# Patient Record
Sex: Male | Born: 1960 | Race: Black or African American | Hispanic: No | Marital: Single | Smoking: Current every day smoker
Health system: Southern US, Community
[De-identification: ages and names within clinical notes are randomized; demographics above are authoritative.]

## PROBLEM LIST (undated history)

## (undated) DIAGNOSIS — M1990 Unspecified osteoarthritis, unspecified site: Secondary | ICD-10-CM

## (undated) DIAGNOSIS — I69354 Hemiplegia and hemiparesis following cerebral infarction affecting left non-dominant side: Secondary | ICD-10-CM

## (undated) DIAGNOSIS — E119 Type 2 diabetes mellitus without complications: Secondary | ICD-10-CM

## (undated) DIAGNOSIS — Z8673 Personal history of transient ischemic attack (TIA), and cerebral infarction without residual deficits: Secondary | ICD-10-CM

## (undated) DIAGNOSIS — G40409 Other generalized epilepsy and epileptic syndromes, not intractable, without status epilepticus: Secondary | ICD-10-CM

## (undated) DIAGNOSIS — N179 Acute kidney failure, unspecified: Secondary | ICD-10-CM

## (undated) DIAGNOSIS — F1010 Alcohol abuse, uncomplicated: Secondary | ICD-10-CM

## (undated) DIAGNOSIS — I10 Essential (primary) hypertension: Secondary | ICD-10-CM

## (undated) DIAGNOSIS — E785 Hyperlipidemia, unspecified: Secondary | ICD-10-CM

## (undated) DIAGNOSIS — R600 Localized edema: Secondary | ICD-10-CM

## (undated) HISTORY — PX: OTHER SURGICAL HISTORY: SHX169

## (undated) HISTORY — DX: Hyperlipidemia, unspecified: E78.5

## (undated) HISTORY — DX: Unspecified osteoarthritis, unspecified site: M19.90

## (undated) HISTORY — DX: Essential (primary) hypertension: I10

## (undated) HISTORY — DX: Acute kidney failure, unspecified: N17.9

## (undated) HISTORY — DX: Localized edema: R60.0

---

## 2006-01-19 ENCOUNTER — Other Ambulatory Visit: Payer: Self-pay

## 2006-01-19 ENCOUNTER — Emergency Department: Payer: Self-pay | Admitting: Emergency Medicine

## 2006-05-13 ENCOUNTER — Emergency Department: Payer: Self-pay | Admitting: Emergency Medicine

## 2006-10-12 ENCOUNTER — Emergency Department: Payer: Self-pay | Admitting: Emergency Medicine

## 2010-01-06 ENCOUNTER — Emergency Department: Payer: Self-pay | Admitting: Emergency Medicine

## 2010-07-18 ENCOUNTER — Emergency Department: Payer: Self-pay | Admitting: Emergency Medicine

## 2014-05-02 ENCOUNTER — Observation Stay: Payer: Self-pay | Admitting: Internal Medicine

## 2014-05-02 DIAGNOSIS — I6789 Other cerebrovascular disease: Secondary | ICD-10-CM

## 2014-05-02 LAB — CBC
HCT: 34.9 % — ABNORMAL LOW (ref 40.0–52.0)
HGB: 11.2 g/dL — ABNORMAL LOW (ref 13.0–18.0)
MCH: 30.5 pg (ref 26.0–34.0)
MCHC: 32.3 g/dL (ref 32.0–36.0)
MCV: 94 fL (ref 80–100)
Platelet: 190 10*3/uL (ref 150–440)
RBC: 3.69 10*6/uL — ABNORMAL LOW (ref 4.40–5.90)
RDW: 13.8 % (ref 11.5–14.5)
WBC: 5.5 10*3/uL (ref 3.8–10.6)

## 2014-05-02 LAB — COMPREHENSIVE METABOLIC PANEL
Albumin: 3.8 g/dL (ref 3.4–5.0)
Alkaline Phosphatase: 102 U/L
Anion Gap: 15 (ref 7–16)
BUN: 22 mg/dL — ABNORMAL HIGH (ref 7–18)
Bilirubin,Total: 0.3 mg/dL (ref 0.2–1.0)
Calcium, Total: 8.3 mg/dL — ABNORMAL LOW (ref 8.5–10.1)
Chloride: 90 mmol/L — ABNORMAL LOW (ref 98–107)
Co2: 19 mmol/L — ABNORMAL LOW (ref 21–32)
Creatinine: 1.34 mg/dL — ABNORMAL HIGH (ref 0.60–1.30)
EGFR (African American): >60
EGFR (Non-African Amer.): 59 — ABNORMAL LOW
Glucose: 120 mg/dL — ABNORMAL HIGH (ref 65–99)
Osmolality: 254 (ref 275–301)
Potassium: 3.6 mmol/L (ref 3.5–5.1)
SGOT(AST): 30 U/L (ref 15–37)
SGPT (ALT): 26 U/L
Sodium: 124 mmol/L — ABNORMAL LOW (ref 136–145)
Total Protein: 8.3 g/dL — ABNORMAL HIGH (ref 6.4–8.2)

## 2014-05-02 LAB — DRUG SCREEN, URINE

## 2014-05-02 LAB — URINALYSIS, COMPLETE
Bilirubin,UR: NEGATIVE
Glucose,UR: NEGATIVE mg/dL (ref 0–75)
Ketone: NEGATIVE
Leukocyte Esterase: NEGATIVE
Nitrite: NEGATIVE
Ph: 5 (ref 4.5–8.0)
Protein: NEGATIVE
RBC,UR: NONE SEEN /HPF (ref 0–5)
Specific Gravity: 1.004 (ref 1.003–1.030)
Squamous Epithelial: <1
WBC UR: NONE SEEN /HPF (ref 0–5)

## 2014-05-02 LAB — BASIC METABOLIC PANEL
Anion Gap: 12 (ref 7–16)
BUN: 19 mg/dL — ABNORMAL HIGH (ref 7–18)
Calcium, Total: 8.5 mg/dL (ref 8.5–10.1)
Chloride: 93 mmol/L — ABNORMAL LOW (ref 98–107)
Co2: 21 mmol/L (ref 21–32)
Creatinine: 1.22 mg/dL (ref 0.60–1.30)
EGFR (African American): >60
EGFR (Non-African Amer.): >60
Glucose: 106 mg/dL — ABNORMAL HIGH (ref 65–99)
Osmolality: 256 (ref 275–301)
Potassium: 3.6 mmol/L (ref 3.5–5.1)
Sodium: 126 mmol/L — ABNORMAL LOW (ref 136–145)

## 2014-05-02 LAB — SALICYLATE LEVEL: Salicylates, Serum: 2 mg/dL

## 2014-05-02 LAB — ETHANOL
Ethanol %: 0.139 % — ABNORMAL HIGH (ref 0.000–0.080)
Ethanol: 139 mg/dL

## 2014-05-02 LAB — ACETAMINOPHEN LEVEL: Acetaminophen: <2 ug/mL

## 2014-05-02 LAB — TROPONIN I: Troponin-I: <0.02 ng/mL

## 2015-01-15 NOTE — Consult Note (Signed)
Referring Physician:  Albertine Patricia   Primary Care Physician:  Albertine Patricia [place], [address] [place], [place] [address], [place] [phone]  Reason for Consult: Admit Date: 02-May-2014  Chief Complaint: stuttering  Reason for Consult: CVA   History of Present Illness: History of Present Illness:   54 yo RHD M presents to [facility] secondary to stuttering and having problems getting out his words.  Pt states that this came on all of a sudden when he awakened yesterday but he thought that it would go away.  Pt has never had anything like this before.  Pt denies any numbness, weakness, vision changes or headaches with this.  Pt admits to drinking but that this happened prior to him drinking yesterday.  ROS:  General denies complaints   HEENT no complaints   Lungs no complaints   Cardiac no complaints   GI no complaints   GU no complaints   Musculoskeletal no complaints   Extremities no complaints   Skin no complaints   Neuro no complaints   Endocrine no complaints   Psych no complaints   Past Medical/Surgical Hx:  Hypercholesterolemia:   Diabetes:   CVA/Stroke:   gout:   hemorroids:   back pain:   htn:   Arthritis:   Denies surgical history.:   Past Medical/ Surgical Hx:  Past Medical History reviewed by me as above   Past Surgical History reviewed by me as above   Home Medications: Medication Instructions Last Modified Date/Time  thiamine 100 mg oral tablet 1 tab(s) orally once a day 09-Aug-15 10:07  atorvastatin 20 mg oral tablet 1 tab(s) orally once a day 09-Aug-15 10:07  Aspir 81 81 mg oral delayed release tablet 1 tab(s) orally once a day 09-Aug-15 03:15   Allergies:  No Known Allergies:   Allergies:  Allergies NKDA   Social/Family History: Employment Status: unemployed  Lives With: alone  Living Arrangements: apartment  Social History: + EtOH abuse with over two pints per day, + tob, denies  illicits  Family History: no seizure, no stroke   Vital Signs: **Vital Signs.:   09-Aug-15 11:47  Vital Signs Type Routine  Temperature Temperature (F) 98.2  Celsius 36.7  Pulse Pulse 77  Respirations Respirations 18  Systolic BP Systolic BP 509  Diastolic BP (mmHg) Diastolic BP (mmHg) 76  Mean BP 96  Pulse Ox % Pulse Ox % 94  Pulse Ox Activity Level  At rest  Oxygen Delivery Room Air/ 21 %   Physical Exam: General: NAD, nl weight, poorly kept  HEENT: normocephalic, sclera nonicteric, oropharynx clear  Neck: supple, no JVD, no bruits  Chest: CTA B, no wheezing, good movement  Cardiac: RRR, no murmurs, no edema, 2+ pulses  Extremities: no C/C/E, FROM   Neurologic Exam: Mental Status: alert and oriented x 3, mild word finding difficulty with nl repetition, stuttering still present, follows all commands  Cranial Nerves: PERRLA, EOMI, nl VF, face symmetric, tongue midline, shoulder shrug equal  Motor Exam: 5/5 B normal, tone, no tremor  Deep Tendon Reflexes: 2+/4 B, plantars downgoing B, no Hoffman  Sensory Exam: pinprick, temperature, and vibration intact B  Coordination: FTN and HTS WNL, nl RAM, nl gait   Lab Results: LabObservation:  09-Aug-15 07:29   OBSERVATION Reason for Test  Hepatic:  09-Aug-15 01:34   Bilirubin, Total 0.3  Alkaline Phosphatase 102 (46-116 NOTE: New Reference Range 04/13/14)  SGPT (ALT) 26 (14-63 NOTE: New Reference Range 04/13/14)  SGOT (AST) 30  Total Protein, Serum  8.3  Albumin, Serum 3.8  General Ref:  09-Aug-15 01:34   Acetaminophen, Serum < 2.0 (10-30 POTENTIALLY TOXIC:  > 200 mcg/mL  > 50 mcg/mL at 12 hr after  ingestion  > 300 mcg/mL at 4 hr after  ingestion)  Salicylates, Serum 2.0 (0.0-2.8 Therapeutic 2.8-20.0 mg/dL Toxic >30.0 mg/dL)  Routine Chem:  09-Aug-15 01:34   Ethanol, S. 139  Ethanol % (comp)  0.139 (Result(s) reported on 02 May 2014 at 02:06AM.)    09:21   Glucose, Serum  106  BUN  19  Creatinine (comp)  1.22  Sodium, Serum  126  Potassium, Serum 3.6  Chloride, Serum  93  CO2, Serum 21  Calcium (Total), Serum 8.5  Anion Gap 12  Osmolality (calc) 256  eGFR (African American) >60  eGFR (Non-African American) >60 (eGFR values <36m/min/1.73 m2 may be an indication of chronic kidney disease (CKD). Calculated eGFR is useful in patients with stable renal function. The eGFR calculation will not be reliable in acutely ill patients when serum creatinine is changing rapidly. It is not useful in  patients on dialysis. The eGFR calculation may not be applicable to patients at the low and high extremes of body sizes, pregnant women, and vegetarians.)  Urine Drugs:  [id number]-[id number]  Tricyclic Antidepressant, Ur Qual (comp) NEGATIVE (Result(s) reported on 02 May 2014 at 03:25AM.)  Amphetamines, Urine Qual. NEGATIVE  MDMA, Urine Qual. NEGATIVE  Cocaine Metabolite, Urine Qual. NEGATIVE  Opiate, Urine qual NEGATIVE  Phencyclidine, Urine Qual. NEGATIVE  Cannabinoid, Urine Qual. NEGATIVE  Barbiturates, Urine Qual. NEGATIVE  Benzodiazepine, Urine Qual. NEGATIVE (----------------- The URINE DRUG SCREEN provides only a preliminary, unconfirmed analytical test result and should not be used for non-medical  purposes.  Clinical consideration and professional judgment should be  applied to any positive drug screen result due to possible interfering substances.  A more specific alternate chemical method must be used in order to obtain a confirmed analytical result.  Gas chromatography/mass spectrometry (GC/MS) is the preferred confirmatory method.)  Methadone, Urine Qual. NEGATIVE  Cardiac:  09-Aug-15 01:34   Troponin I < 0.02 (0.00-0.05 0.05 ng/mL or less: NEGATIVE  Repeat testing in 3-6 hrs  if clinically indicated. >0.05 ng/mL: POTENTIAL  MYOCARDIAL INJURY. Repeat  testing in 3-6 hrs if  clinically indicated. NOTE: An increase or decrease  of 30% or more on serial  testing suggests a   clinically important change)  Routine UA:  09-Aug-15 01:34   Color (UA) Colorless  Clarity (UA) Clear  Glucose (UA) Negative  Bilirubin (UA) Negative  Ketones (UA) Negative  Specific Gravity (UA) 1.004  Blood (UA) 1+  pH (UA) 5.0  Protein (UA) Negative  Nitrite (UA) Negative  Leukocyte Esterase (UA) Negative (Result(s) reported on 02 May 2014 at 03:19AM.)  RBC (UA) NONE SEEN  WBC (UA) NONE SEEN  Bacteria (UA) TRACE  Epithelial Cells (UA) <1 /HPF  Mucous (UA) PRESENT (Result(s) reported on 02 May 2014 at 03:19AM.)  Routine Hem:  09-Aug-15 01:34   WBC (CBC) 5.5  RBC (CBC)  3.69  Hemoglobin (CBC)  11.2  Hematocrit (CBC)  34.9  Platelet Count (CBC) 190 (Result(s) reported on 02 May 2014 at 01:47AM.)  MCV 94  MCH 30.5  MCHC 32.3  RDW 13.8   Radiology Results: MRI:    09-Aug-15 10:54, MRI Brain Without Contrast  MRI Brain Without Contrast   REASON FOR EXAM:    CVA  COMMENTS:  PROCEDURE: MR  - MR BRAIN WO CONTRAST  - May 02 2014 10:54AM     CLINICAL DATA:  54 year old male with abnormal speech. Intractable  hiccups. Initial encounter.    EXAM:  MRI HEAD WITHOUT CONTRAST    TECHNIQUE:  Multiplanar, multiecho pulse sequences of the brain and surrounding  structures were obtained without intravenous contrast.  COMPARISON:  Head CT without contrast 0155 hr the same day.    FINDINGS:  Study is intermittently degraded by motion artifact despite repeated  imaging attempts.    Large body habitus. Cerebral volume is within normal limits for age.  No restricted diffusion to suggest acute infarction. No midline  shift, mass effect, evidence of mass lesion, ventriculomegaly,  extra-axial collection or acute intracranial hemorrhage.  Cervicomedullary junction and pituitary are within normal limits.  Negative visualized cervical spine. Major intracranial vascular flow  voids are preserved.    Chronic right external capsule lacunar infarct with hemosiderin.  Chronic  small right basal ganglia perivascular spaces or lacunar  infarcts. Patchy bilateral cerebral white matter T2 and FLAIR  hyperintensity. Chronic micro hemorrhage in the left caudate  nucleus. No definite cortical encephalomalacia. Brainstem and  cerebellum within normal limits.    Visible internal auditory structures appear normal. Mastoids are  clear. Mild paranasal sinus mucosal thickening mostly the right  maxillary. Visualized orbit soft tissues are within normal limits.  Visualized scalp soft tissues are within normal limits. Normal bone  marrow signal.     IMPRESSION:  1.  No acute intracranial abnormality.  2. Moderate to severe for age chronic small vessel disease /  ischemia.      Electronically Signed    By: Lars Pinks M.D.    On: 05/02/2014 11:04         Verified By: Gwenyth Bender. HALL, M.D.,  CT:    09-Aug-15 01:59, CT Head Without Contrast  CT Head Without Contrast   REASON FOR EXAM:    speech difficulty and hiccups, eval for cva  COMMENTS:       PROCEDURE: CT  - CT HEAD WITHOUT CONTRAST  - May 02 2014  1:59AM     CLINICAL DATA:  Speech difficulty in the calves. Evaluate for  stroke.    EXAM:  CT HEAD WITHOUT CONTRAST    TECHNIQUE:  Contiguous axial images were obtained from the base of the skull  through the vertex without intravenous contrast.  COMPARISON:  Prior CT from 07/18/2010    FINDINGS:  Remote right basal ganglia infarct noted. Scattered and confluent  hypodensity within the periventricular and deep white matter both  cerebral hemispheres is most compatible with mild chronic  microvascular ischemic disease.    There is no acute intracranial hemorrhage or infarct. No mass lesion  or midline shift. Gray-white matter differentiation is well  maintained. Ventricles are normal in size without evidence of  hydrocephalus. CSF containing spaces are within normal limits. No  extra-axial fluid collection.    The calvarium is intact.  Orbital soft  tissues are within normal limits.    The paranasal sinuses and mastoid air cells are well pneumatized and  free of fluid.    Scalp soft tissues are unremarkable.     IMPRESSION:  1. No acute intracranial process.  2. Remote right basal ganglia infarct.      Electronically Signed    By: Jeannine Boga M.D.    On: 05/02/2014 02:23     Verified By: Neomia Glass, M.D.,  Radiology Impression: Radiology Impression: MRI of brain personally reviewed by me and shows old L frontal infarct but no acute infarct   Impression/Recommendations: Recommendations:   prior notes reviewed by me reviewed by me   Speech changes-  these are likely worsening of old L frontal stroke from non neurologic causes such as hyponatremia and EtOH abuse Hyponatremia-  mildly symptomatic continue ASA and statin correct hyponatremia, must keep Na > 130 pt needs to stop drinking will sign off, please call with questions  Electronic Signatures: Jamison Neighbor (MD)  (Signed 09-Aug-15 12:49)  Authored: REFERRING PHYSICIAN, Primary Care Physician, Consult, History of Present Illness, Review of Systems, PAST MEDICAL/SURGICAL HISTORY, HOME MEDICATIONS, ALLERGIES, Social/Family History, NURSING VITAL SIGNS, Physical Exam-, LAB RESULTS, RADIOLOGY RESULTS, Recommendations   Last Updated: 09-Aug-15 12:49 by Jamison Neighbor (MD)

## 2015-01-15 NOTE — Discharge Summary (Signed)
PATIENT NAME:  Brad DuttonBALDWIN, Cartez A MR#:  [id number] DATE OF BIRTH:  01-18-1961  DATE OF ADMISSION:  05/02/2014 DATE OF DISCHARGE:  05/02/2014  DISCHARGE DIAGNOSES: 1. Alcohol abuse.  2. History of cerebrovascular accident with chronic speech problems.  3. Hyperlipidemia.  4. Noncompliance.  5. Tobacco abuse.   CONSULTANTS: Dr. Katrinka BlazingSmith with neurology.   IMAGING STUDIES DONE: Include a CT scan of the head without contrast which showed old strokes in the right basal ganglia; nothing acute.   MRI of the brain without contrast showed moderate to severe age-related chronic  changes, prior strokes. Nothing acute found.   Carotid Doppler showed stenosis less than 50%.   Echocardiogram showed ejection fraction of 60% to 65%, normal RVSP. No source for TIA or CVA found.   ADMITTING HISTORY AND PHYSICAL: Please see detailed H and P dictated by Dr. Randol KernElgergawy .   In brief, a 54 year old male patient with known CVA and prior speech problem presented to the hospital with hiccups and stuttering speech which was worse. The patient was admitted after consulting with neurology for concern for a new stroke. The patient also had alcohol level of 0.13 in the blood.   HOSPITAL COURSE: The patient was seen by neurology. Symptoms were thought to be likely secondary to his alcohol intoxication. The patient's sodium was a little low, but this seemed to be asymptomatic, was improved. He has been counseled to quit alcohol. He was also counseled to quit smoking for greater than 3 minutes on the day of discharge. The patient's symptoms seem to be chronic and he is being discharged home in a stable condition without any other focal neurological deficit.   DISCHARGE MEDICATIONS: 1. Aspirin 81 mg daily.  2. Atorvastatin 20 mg daily.  DISCHARGE INSTRUCTIONS: Low-fat diet. Activity as tolerated. Follow up with primary care physician in a week. Quit smoking and alcohol.   ____________________________ Molinda BailiffSrikar R. Shayle Donahoo,  MD srs:NT D: 05/05/2014 15:17:07 ET T: 05/05/2014 17:23:03 ET JOB#: [id number]  cc: Wardell HeathSrikar R. Amiir Heckard, MD, <Dictator> Orie FishermanSRIKAR R Johnetta Sloniker MD ELECTRONICALLY SIGNED 05/10/2014 14:11

## 2015-01-15 NOTE — H&P (Signed)
Brad Graham NAME:  Brad Brad Graham, Brad Brad Graham MR#:  [id number] DATE OF BIRTH:  Mar 24, 1961  DATE OF ADMISSION:  05/02/2014  REFERRING PHYSICIAN: Penni BombardKevin Brad Graham  PRIMARY CARE PHYSICIAN: Brad Brad Graham.  His first name is Brad NajjarLarry.  He does not know Brad last name.    CHIEF COMPLAINT: Hiccups, and stuttering.   HISTORY OF PRESENT ILLNESS:  This is Brad Brad Graham with known history of CVA in Brad past with no residual deficits, hypertension, hyperlipidemia, diabetes mellitus, tobacco abuse, alcohol abuse, who presents with above-mentioned complaints. Brad Brad Graham reports he started to have sudden hiccups this afternoon. Lasted for Brad Graham couple hours and resolved, but then reports at night he started to have Brad Graham stuttering problem. Brad Brad Graham reports he had stuttering in Brad past intermittent, on and off, but reports it did not come back bad for Brad Graham few years, which prompted him to come to Brad ED.  In Brad ED, Brad Brad Graham noticed not to have any other focal deficits. He denies any tingling, any numbness, any arm or leg weakness. Besides Brad stuttering he had no other speech abnormalities. There is no aphagia. Brad Brad Graham's CT head did not show any acute findings. Only showed evidence of his old cerebrovascular accident. Blood work-up was significant for hyponatremia at 124. Drug of abuse was negative. Brad Brad Graham had no leukocytosis as well as afebrile, blood pressure was acceptable. EKG did not show any abnormalities was normal sinus rhythm without ST or T wave abnormalities. ED physician discussed with Dr. Katrinka BlazingSmith, neurological on call, who requested Brad Brad Graham to be admitted for further CVA evaluation as this presentation as well might be due to CVA. Brad Brad Graham was given 324 mg of aspirin in ED.    PAST MEDICAL HISTORY:  1. Diabetes mellitus.  2. Hypertension.  3. Hyperlipidemia.  4. History of CVA.  5. Gout.  6. Arthritis.   ALLERGIES: No known drug allergies.   HOME  MEDICATIONS: Brad Brad Graham cannot recall his medications, but he knows he takes 1 medicine for blood pressure, hydrochlorothiazide, does not recall Brad dose. He does take cholesterol medicine as well but does not remember Brad name or Brad dose and he is on diabetes medication as well, does not remember Brad dose or Brad name as well and he is on aspirin 81 mg daily.   FAMILY HISTORY: Denies any family history of coronary artery disease at young age.   SOCIAL HISTORY: Brad Brad Graham lives at home by himself, has Brad Graham history of heavy alcohol abuse. Uses 1 pint of hard liquor per day, smokes 1 pack per day. No illicit drug use. He is on SSI.   REVIEW OF SYSTEMS: CONSTITUTIONAL: Denies fever, chills, fatigue, weakness, weight gain, weight loss.  EYES: Denies blurred vision, double vision, inflammation, glaucoma.  EARS, NOSE AND THROAT: Denies tinnitus, ear pain, hearing loss, epistaxis or discharge.  RESPIRATORY: Denies cough, wheezing, hemoptysis, COPD.  CARDIOVASCULAR: Denies chest pain, edema, palpitations, syncope.  GASTROINTESTINAL: Denies nausea, vomiting, diarrhea, abdominal pain, hematemesis, melena.  GENITOURINARY: Denies dysuria, hematuria, renal colic.  ENDOCRINE: Denies polyuria, polydipsia, heat or cold intolerance.  HEMATOLOGY: Denies anemia, easy bruising, bleeding diatheses.  INTEGUMENT: Denies acne, rash, or skin lesion.  MUSCULOSKELETAL: Denies any swelling, cramps. Reports history of gout and arthritis.  NEUROLOGIC: Brad Brad Graham reports hiccups and stuttering problem, but denies any focal deficits. No tingling. No numbness. No altered mental status. Reports history of CVA in Brad past.  PSYCHIATRIC: Denies anxiety, insomnia, or depression admits to  heavy alcohol abuse.   PHYSICAL EXAMINATION:  VITAL SIGNS: Temperature 97.8, pulse 77, respiratory rate 20, blood pressure 133/87, saturating 99% on room air.  GENERAL: Well-built, well-nourished Graham who looks comfortable in bed, in no apparent  distress.  HEENT: Head atraumatic, normocephalic. Pupils equal, reactive to light. Pink conjunctivae. Anicteric sclerae. Moist oral mucosa. No nasal discharge or bleed.  NECK: Supple. No thyromegaly. No JVD. No carotid bruits. No lymphadenopathy.  CHEST: Good air entry bilaterally. No wheezing, rales or rhonchi. No use of accessory muscle.  CARDIOVASCULAR: S1, S2 heard. No rubs, murmurs, gallops. Regular rate and rhythm. PMI nondisplaced.  ABDOMEN: Soft, nontender, nondistended. Bowel sounds present. No rebound, no guarding.  EXTREMITIES: No edema. No clubbing. No cyanosis. Pedal pulses +2 bilaterally. Long toenails.  MUSCULOSKELETAL: No joint effusion or erythema. No back pain.  PSYCHIATRIC: Appropriate affect. Awake, alert x 3. Intact judgment and insight.  NEUROLOGIC: Cranial nerves grossly intact. Motor 5 out of 5.. No focal deficits, no dysmetria. Negative Romberg sign, normal finger-to-nose test. Sensation is symmetrical and intact bilaterally to light touch. Brad Brad Graham has apparent stuttering problem, but has not aphagia, no expressive aphasia.    PERTINENT LABORATORY DATA: Glucose 120, BUN 22, creatinine 1.34, sodium 124, potassium 3.6, chloride 90, CO2 19. ALT 26, AST 30, alkaline phosphatase 102. Troponin less than 0.02. Drugs of abuse negative for entire panel. White blood cell 5.5, hemoglobin 11.2, hematocrit 34.9, platelets 190,000. Salicylate 2, acetaminophen less than 2.    IMAGING STUDIES:  CT head with contrast showing no acute intracranial process and old right basal ganglion infarct.   ASSESSMENT AND PLAN:  1. Stuttering with hiccups, currently hiccups resolved, stuttering still persisted, ED discussed with Brad neurologist on-call, Dr. Katrinka Blazing, who requested Brad Brad Graham to be admitted for cerebrovascular accident evaluation, we will obtain MRI, carotid Dopplers and 2D echocardiogram. We will admit him to telemetry as well. We will consult neurology consult. We will give Brad Brad Graham  324 mg of aspirin. We will continue him on statin and aspirin at this point.  2. Alcohol abuse. Brad Brad Graham will be started on [facility] Withdrawal [facility] protocol.  3. Tobacco abuse: Brad Brad Graham will be started on nicotine patch, was counseled.  4. Hyponatremia. This is most likely related to his heavy alcohol consumption and hydrochlorothiazide. We will hold hydrochlorothiazide and will start him on intravenous normal saline. We will monitor closely.  5. Hypertension. Blood pressure is acceptable. Will resume him back on his home medications once Brad dose is known, but for as well for Brad time being, we will allow for permissive   hypertension, . diabetes mellitus. We will start on insulin sliding scale until his oral medications are known. 6. History of cerebrovascular accident. Continue with aspirin, statin.  7. Hyperlipidemia. Continue with statin.  8. Deep vein thrombosis prophylaxis. Subcutaneous heparin.   CODE STATUS: Full code.   TOTAL TIME SPENT ON ADMISSION AND Brad Graham CARE: 55 minutes.     ____________________________ Starleen Arms, MD dse:jh D: 05/02/2014 03:47:07 ET T: 05/02/2014 04:18:00 ET JOB#: [id number]  cc: Starleen Arms, MD, <Dictator> Antrice Pal Teena Irani MD ELECTRONICALLY SIGNED 05/03/2014 0:05

## 2015-05-10 ENCOUNTER — Encounter: Payer: Self-pay | Admitting: Unknown Physician Specialty

## 2015-05-10 ENCOUNTER — Ambulatory Visit (INDEPENDENT_AMBULATORY_CARE_PROVIDER_SITE_OTHER): Payer: Medicaid Other | Admitting: Unknown Physician Specialty

## 2015-05-10 VITALS — BP 116/78 | HR 80 | Temp 98.2°F | Ht 68.7 in | Wt 220.4 lb

## 2015-05-10 DIAGNOSIS — R739 Hyperglycemia, unspecified: Secondary | ICD-10-CM

## 2015-05-10 DIAGNOSIS — R809 Proteinuria, unspecified: Secondary | ICD-10-CM | POA: Insufficient documentation

## 2015-05-10 DIAGNOSIS — N189 Chronic kidney disease, unspecified: Secondary | ICD-10-CM

## 2015-05-10 DIAGNOSIS — E119 Type 2 diabetes mellitus without complications: Secondary | ICD-10-CM | POA: Insufficient documentation

## 2015-05-10 DIAGNOSIS — I10 Essential (primary) hypertension: Secondary | ICD-10-CM | POA: Insufficient documentation

## 2015-05-10 DIAGNOSIS — I129 Hypertensive chronic kidney disease with stage 1 through stage 4 chronic kidney disease, or unspecified chronic kidney disease: Secondary | ICD-10-CM | POA: Diagnosis not present

## 2015-05-10 DIAGNOSIS — E785 Hyperlipidemia, unspecified: Secondary | ICD-10-CM

## 2015-05-10 LAB — LIPID PANEL PICCOLO, WAIVED
Chol/HDL Ratio Piccolo,Waive: 2.4 mg/dL
Cholesterol Piccolo, Waived: 213 mg/dL — ABNORMAL HIGH (ref <200)
HDL Chol Piccolo, Waived: 90 mg/dL (ref >59)
LDL Chol Calc Piccolo Waived: 89 mg/dL (ref <100)
Triglycerides Piccolo,Waived: 169 mg/dL — ABNORMAL HIGH (ref <150)
VLDL Chol Calc Piccolo,Waive: 34 mg/dL — ABNORMAL HIGH (ref <30)

## 2015-05-10 LAB — MICROALBUMIN, URINE WAIVED
Creatinine, Urine Waived: 300 mg/dL (ref 10–300)
Microalb, Ur Waived: 150 mg/L — ABNORMAL HIGH (ref 0–19)

## 2015-05-10 LAB — BAYER DCA HB A1C WAIVED: HB A1C (BAYER DCA - WAIVED): 5.6 % (ref <7.0)

## 2015-05-10 MED ORDER — ATORVASTATIN CALCIUM 20 MG PO TABS: 20.0000 mg | ORAL_TABLET | Freq: Every day | ORAL | Status: DC

## 2015-05-10 NOTE — Assessment & Plan Note (Signed)
Elevated microalbumin

## 2015-05-10 NOTE — Assessment & Plan Note (Addendum)
S/P stroke and ASCVD calculator recommends statin therapy.  Started on atorvastatin  daily. Recheck in 3 months

## 2015-05-10 NOTE — Assessment & Plan Note (Signed)
A1C 5.6 on 05/10/15

## 2015-05-10 NOTE — Progress Notes (Signed)
BP 116/78 mmHg  Pulse 80  Temp(Src) 98.2 F (36.8 C)  Ht 5' 8.7" (1.745 m)  Wt 220 lb 6.4 oz (99.973 kg)  BMI 32.83 kg/m2  SpO2 99%   Subjective:    Patient ID: Brad Graham, male    DOB: 1961/02/10, 54 y.o.   MRN: [id number]  HPI: Brad Graham is a 54 y.o. male  Chief Complaint  Patient presents with  . Establish Care   Hypertension/Hyperlipidemia: Patient reports he is compliant with blood pressure medication but does not recall the names of meds.  Denies chest pain, dyspnea or headache. He does not monitor blood pressure at home. He reports a history of stroke. He is a smoker and reports smoking 3 -4 cigarettes per day. He reports drinking 2-3 drinks per day a "few times a week". He admitted to drinking four of the last five days. He is on no cholesterol medications at this time.    Hyperglycemia: He reports taking a "sugar pill" each day, does not know name of pill or dose. Denies any change in sensation to extremities. Denies change in diet, appetite or weight. Does not monitor blood sugars at home.    Relevant past medical, surgical, family and social history reviewed and updated as indicated. Interim medical history since our last visit reviewed. Allergies and medications reviewed and updated.  Review of Systems  Constitutional: Negative.  Negative for fever, chills, activity change, appetite change, fatigue and unexpected weight change.  HENT: Negative.  Negative for congestion, sneezing, sore throat and trouble swallowing.   Eyes: Negative.   Respiratory: Negative.  Negative for cough, chest tightness, shortness of breath, wheezing and stridor.   Cardiovascular: Negative.  Negative for chest pain, palpitations and leg swelling.  Skin: Negative.  Negative for color change, pallor, rash and wound.  Psychiatric/Behavioral: Negative.  Negative for behavioral problems, confusion and agitation. The patient is not nervous/anxious.     Per HPI unless specifically  indicated above     Objective:    BP 116/78 mmHg  Pulse 80  Temp(Src) 98.2 F (36.8 C)  Ht 5' 8.7" (1.745 m)  Wt 220 lb 6.4 oz (99.973 kg)  BMI 32.83 kg/m2  SpO2 99%  Wt Readings from Last 3 Encounters:  05/10/15 220 lb 6.4 oz (99.973 kg)    Physical Exam  Constitutional: He is oriented to person, place, and time. He appears well-developed and well-nourished. No distress.  HENT:  Head: Normocephalic and atraumatic.  Eyes: Pupils are equal, round, and reactive to light.  Neck: Normal range of motion.  Cardiovascular: Normal rate, regular rhythm and normal heart sounds.  Exam reveals no gallop and no friction rub.   No murmur heard. Pulmonary/Chest: Effort normal and breath sounds normal. No respiratory distress. He has no wheezes. He has no rales. He exhibits no tenderness.  Neurological: He is alert and oriented to person, place, and time.  Skin: Skin is warm and dry. No rash noted. He is not diaphoretic. No erythema. No pallor.  Psychiatric: He has a normal mood and affect. His behavior is normal. Judgment and thought content normal.        Assessment & Plan:   Problem List Items Addressed This Visit      Unprioritized   Essential hypertension - Primary    Well controlled on Amlodipine , carvedilol  twice daily and lisinopril/hydrochlorothiazide 20-25 daily.      Relevant Medications   amLODipine (NORVASC) 5 MG tablet   lisinopril-hydrochlorothiazide (PRINZIDE,ZESTORETIC) 20-25 MG  per tablet   carvedilol (COREG) 25 MG tablet   atorvastatin (LIPITOR) 20 MG tablet   Hyperglycemia    A1C 5.6 on 05/10/15      Relevant Orders   Microalbumin, Urine Waived (Completed)   Uric acid   Bayer DCA Hb A1c Waived (Completed)   Hyperlipidemia    S/P stroke and ASCVD calculator recommends statin therapy.  Started on atorvastatin 20mg  daily. Recheck in 3 months      Relevant Medications   amLODipine (NORVASC) 5 MG tablet   lisinopril-hydrochlorothiazide  (PRINZIDE,ZESTORETIC) 20-25 MG per tablet   carvedilol (COREG) 25 MG tablet   atorvastatin (LIPITOR) 20 MG tablet   Other Relevant Orders   Lipid Panel Piccolo, Waived (Completed)   Comprehensive metabolic panel   Proteinuria    Elevated microalbumin         A1C is 5.6. Microalbumin is 150 today. Total cholesterol is 213 and triglycerides 169, remainder of labs still pending.   Follow up plan: Return in about 3 months (around 08/10/2015). Recheck lipid panel.

## 2015-05-10 NOTE — Assessment & Plan Note (Addendum)
Well controlled on Amlodipine , carvedilol  twice daily and lisinopril/hydrochlorothiazide 20-25 daily.

## 2015-05-11 LAB — COMPREHENSIVE METABOLIC PANEL
ALT: 11 IU/L (ref 0–44)
AST: 13 IU/L (ref 0–40)
Albumin/Globulin Ratio: 1.4 (ref 1.1–2.5)
Albumin: 4.3 g/dL (ref 3.5–5.5)
Alkaline Phosphatase: 79 IU/L (ref 39–117)
BUN/Creatinine Ratio: 21 — ABNORMAL HIGH (ref 9–20)
BUN: 49 mg/dL — ABNORMAL HIGH (ref 6–24)
Bilirubin Total: 0.4 mg/dL (ref 0.0–1.2)
CO2: 16 mmol/L — ABNORMAL LOW (ref 18–29)
Calcium: 9.2 mg/dL (ref 8.7–10.2)
Chloride: 104 mmol/L (ref 97–108)
Creatinine, Ser: 2.36 mg/dL — ABNORMAL HIGH (ref 0.76–1.27)
GFR calc Af Amer: 34 mL/min/{1.73_m2} — ABNORMAL LOW (ref >59)
GFR calc non Af Amer: 30 mL/min/{1.73_m2} — ABNORMAL LOW (ref >59)
Globulin, Total: 3.1 g/dL (ref 1.5–4.5)
Glucose: 91 mg/dL (ref 65–99)
Potassium: 4.9 mmol/L (ref 3.5–5.2)
Sodium: 140 mmol/L (ref 134–144)
Total Protein: 7.4 g/dL (ref 6.0–8.5)

## 2015-05-11 LAB — URIC ACID: Uric Acid: 10.4 mg/dL — ABNORMAL HIGH (ref 3.7–8.6)

## 2015-05-13 ENCOUNTER — Other Ambulatory Visit: Payer: Self-pay | Admitting: Unknown Physician Specialty

## 2015-05-13 DIAGNOSIS — N183 Chronic kidney disease, stage 3 unspecified: Secondary | ICD-10-CM

## 2015-05-13 NOTE — Progress Notes (Signed)
Phone call with patient to discuss GFR of 30.  Refer to [facility].  Asked to monitor BP and avoid NSAIDS

## 2015-05-26 ENCOUNTER — Other Ambulatory Visit: Payer: Self-pay

## 2015-05-26 MED ORDER — LISINOPRIL-HYDROCHLOROTHIAZIDE 20-25 MG PO TABS: 1.0000 | ORAL_TABLET | Freq: Every day | ORAL | Status: DC

## 2015-05-26 MED ORDER — AMLODIPINE BESYLATE 5 MG PO TABS: 5.0000 mg | ORAL_TABLET | Freq: Every day | ORAL | Status: DC

## 2015-05-26 NOTE — Telephone Encounter (Signed)
Patient was last seen 05/10/15 and pharmacy is [facility].

## 2015-05-31 ENCOUNTER — Other Ambulatory Visit: Payer: Self-pay | Admitting: Nephrology

## 2015-05-31 DIAGNOSIS — N183 Chronic kidney disease, stage 3 (moderate): Secondary | ICD-10-CM

## 2015-06-13 ENCOUNTER — Ambulatory Visit
Admission: RE | Admit: 2015-06-13 | Discharge: 2015-06-13 | Disposition: A | Discharge status: Home / Self Care | Payer: Medicaid Other | Source: Ambulatory Visit | Attending: Nephrology | Admitting: Nephrology

## 2015-08-10 ENCOUNTER — Ambulatory Visit: Payer: Medicaid Other | Admitting: Unknown Physician Specialty

## 2015-08-15 ENCOUNTER — Encounter: Payer: Self-pay | Admitting: Unknown Physician Specialty

## 2015-08-15 ENCOUNTER — Other Ambulatory Visit: Payer: Self-pay | Admitting: Unknown Physician Specialty

## 2015-08-15 ENCOUNTER — Ambulatory Visit (INDEPENDENT_AMBULATORY_CARE_PROVIDER_SITE_OTHER): Payer: Medicaid Other | Admitting: Unknown Physician Specialty

## 2015-08-15 VITALS — BP 125/84 | HR 82 | Temp 98.3°F | Ht 68.0 in | Wt 218.2 lb

## 2015-08-15 DIAGNOSIS — E785 Hyperlipidemia, unspecified: Secondary | ICD-10-CM | POA: Diagnosis not present

## 2015-08-15 NOTE — Progress Notes (Signed)
BP 125/84 mmHg  Pulse 82  Temp(Src) 98.3 F (36.8 C)  Ht  (1.727 m)  Wt 218 lb 3.2 oz (98.975 kg)  BMI 33.18 kg/m2  SpO2 98%   Subjective:    Patient ID: Brad Graham, male    DOB: Jun 09, 1961, 54 y.o.   MRN: [id number]  HPI: Brad Graham is a 54 y.o. male  Chief Complaint  Patient presents with  . Hyperlipidemia  . Hypertension   Hypertension Seeing nephrology for CKD notes last visit Using medications without difficulty Average home BPs not checking   No problems or lightheadedness No chest pain with exertion or shortness of breath No Edema   Hyperlipidemia Using medications without problems: No Muscle aches  Diet compliance watches what he eats Exercise: not exercising    Relevant past medical, surgical, family and social history reviewed and updated as indicated. Interim medical history since our last visit reviewed. Allergies and medications reviewed and updated.  Review of Systems  Per HPI unless specifically indicated above     Objective:    BP 125/84 mmHg  Pulse 82  Temp(Src) 98.3 F (36.8 C)  Ht  (1.727 m)  Wt 218 lb 3.2 oz (98.975 kg)  BMI 33.18 kg/m2  SpO2 98%  Wt Readings from Last 3 Encounters:  08/15/15 218 lb 3.2 oz (98.975 kg)  05/10/15 220 lb 6.4 oz (99.973 kg)    Physical Exam  Constitutional: He is oriented to person, place, and time. He appears well-developed and well-nourished. No distress.  HENT:  Head: Normocephalic and atraumatic.  Eyes: Conjunctivae and lids are normal. Right eye exhibits no discharge. Left eye exhibits no discharge. No scleral icterus.  Cardiovascular: Normal rate, regular rhythm and normal heart sounds.   Pulmonary/Chest: Effort normal and breath sounds normal. No respiratory distress.  Abdominal: Normal appearance. There is no splenomegaly or hepatomegaly.  Musculoskeletal: Normal range of motion.  Neurological: He is alert and oriented to person, place, and time.  Skin: Skin is  intact. No rash noted. No pallor.  Psychiatric: He has a normal mood and affect. His behavior is normal. Judgment and thought content normal.    Results for orders placed or performed in visit on 05/10/15  Microalbumin, Urine Waived  Result Value Ref Range   Microalb, Ur Waived 150 (H) 0 - 19 mg/L   Creatinine, Urine Waived 300 10 - 300 mg/dL   Microalb/Creat Ratio 30-300 (H) <30 mg/g  Uric acid  Result Value Ref Range   Uric Acid 10.4 (H) 3.7 - 8.6 mg/dL  Bayer DCA Hb L2G Waived  Result Value Ref Range   Bayer DCA Hb A1c Waived 5.6 <7.0 %  Lipid Panel Piccolo, Waived  Result Value Ref Range   Cholesterol Piccolo, Waived 213 (H) <200 mg/dL   HDL Chol Piccolo, Waived 90 >59 mg/dL   Triglycerides Piccolo,Waived 169 (H) <150 mg/dL   Chol/HDL Ratio Piccolo,Waive 2.4 mg/dL   LDL Chol Calc Piccolo Waived 89 <100 mg/dL   VLDL Chol Calc Piccolo,Waive 34 (H) <30 mg/dL  Comprehensive metabolic panel  Result Value Ref Range   Glucose 91 65 - 99 mg/dL   BUN 49 (H) 6 - 24 mg/dL   Creatinine, Ser 4.01 (H) 0.76 - 1.27 mg/dL   GFR calc non Af Amer 30 (L) >59 mL/min/1.73   GFR calc Af Amer 34 (L) >59 mL/min/1.73   BUN/Creatinine Ratio 21 (H) 9 - 20   Sodium 140 134 - 144 mmol/L   Potassium 4.9  3.5 - 5.2 mmol/L   Chloride 104 97 - 108 mmol/L   CO2 16 (L) 18 - 29 mmol/L   Calcium 9.2 8.7 - 10.2 mg/dL   Total Protein 7.4 6.0 - 8.5 g/dL   Albumin 4.3 3.5 - 5.5 g/dL   Globulin, Total 3.1 1.5 - 4.5 g/dL   Albumin/Globulin Ratio 1.4 1.1 - 2.5   Bilirubin Total 0.4 0.0 - 1.2 mg/dL   Alkaline Phosphatase 79 39 - 117 IU/L   AST 13 0 - 40 IU/L   ALT 11 0 - 44 IU/L      Assessment & Plan:   Problem List Items Addressed This Visit      Unprioritized   Hyperlipidemia - Primary   Relevant Orders   LP+ALT+AST Piccolo, Waived       Follow up plan: Return in about 6 months (around 02/12/2016).

## 2015-08-16 LAB — LP+ALT+AST PICCOLO, WAIVED
ALT (SGPT) Piccolo, Waived: 20 U/L (ref 10–47)
AST (SGOT) Piccolo, Waived: 39 U/L — ABNORMAL HIGH (ref 11–38)

## 2015-08-20 LAB — LIPID PANEL W/O CHOL/HDL RATIO
Cholesterol, Total: 176 mg/dL
HDL: 131 mg/dL (ref >39)
LDL Calculated: 35 mg/dL
Triglycerides: 50 mg/dL
VLDL Cholesterol Cal: 10 mg/dL (ref 5–40)

## 2015-08-20 LAB — LIPID PANEL PICCOLO, WAIVED

## 2015-08-26 ENCOUNTER — Other Ambulatory Visit: Payer: Self-pay

## 2015-08-26 MED ORDER — TAMSULOSIN HCL 0.4 MG PO CAPS: 0.4000 mg | ORAL_CAPSULE | Freq: Every day | ORAL | Status: DC

## 2015-08-26 MED ORDER — CARVEDILOL 25 MG PO TABS: 25.0000 mg | ORAL_TABLET | Freq: Two times a day (BID) | ORAL | Status: DC

## 2015-08-26 NOTE — Telephone Encounter (Signed)
Patient was last seen 08/15/15 [facility]

## 2015-09-02 ENCOUNTER — Other Ambulatory Visit: Payer: Self-pay | Admitting: Unknown Physician Specialty

## 2015-10-24 ENCOUNTER — Other Ambulatory Visit: Payer: Self-pay | Admitting: Unknown Physician Specialty

## 2015-11-08 ENCOUNTER — Other Ambulatory Visit: Payer: Self-pay | Admitting: Unknown Physician Specialty

## 2015-11-17 ENCOUNTER — Other Ambulatory Visit: Payer: Self-pay | Admitting: Unknown Physician Specialty

## 2016-01-23 ENCOUNTER — Other Ambulatory Visit: Payer: Self-pay | Admitting: Unknown Physician Specialty

## 2016-01-25 ENCOUNTER — Other Ambulatory Visit: Payer: Self-pay | Admitting: Unknown Physician Specialty

## 2016-02-07 ENCOUNTER — Other Ambulatory Visit: Payer: Self-pay | Admitting: Unknown Physician Specialty

## 2016-02-13 ENCOUNTER — Ambulatory Visit: Payer: Medicaid Other | Admitting: Unknown Physician Specialty

## 2016-02-17 ENCOUNTER — Other Ambulatory Visit: Payer: Self-pay | Admitting: Unknown Physician Specialty

## 2016-02-22 ENCOUNTER — Other Ambulatory Visit: Payer: Self-pay | Admitting: Unknown Physician Specialty

## 2016-02-22 ENCOUNTER — Ambulatory Visit: Payer: Medicaid Other | Admitting: Unknown Physician Specialty

## 2016-02-22 NOTE — Telephone Encounter (Signed)
Needs seen further refills 

## 2016-03-05 NOTE — Telephone Encounter (Signed)
Pt scheduled for a follow up appt 03/13/16. Thanks.

## 2016-03-07 ENCOUNTER — Ambulatory Visit: Payer: Medicaid Other | Admitting: Unknown Physician Specialty

## 2016-03-13 ENCOUNTER — Ambulatory Visit: Payer: Medicaid Other | Admitting: Unknown Physician Specialty

## 2016-03-20 ENCOUNTER — Other Ambulatory Visit: Payer: Self-pay | Admitting: Unknown Physician Specialty

## 2016-03-26 ENCOUNTER — Ambulatory Visit: Payer: Medicaid Other | Admitting: Unknown Physician Specialty

## 2016-04-02 ENCOUNTER — Ambulatory Visit: Payer: Medicaid Other | Admitting: Unknown Physician Specialty

## 2016-04-09 ENCOUNTER — Ambulatory Visit: Payer: Medicaid Other | Admitting: Unknown Physician Specialty

## 2016-04-13 ENCOUNTER — Ambulatory Visit: Payer: Medicaid Other | Admitting: Unknown Physician Specialty

## 2016-04-19 ENCOUNTER — Other Ambulatory Visit: Payer: Self-pay | Admitting: Unknown Physician Specialty

## 2016-04-19 NOTE — Telephone Encounter (Signed)
Your patient 

## 2016-04-20 ENCOUNTER — Ambulatory Visit: Payer: Medicaid Other | Admitting: Unknown Physician Specialty

## 2016-04-20 NOTE — Telephone Encounter (Signed)
Patient has appointment 04/23/16.

## 2016-04-23 ENCOUNTER — Ambulatory Visit: Payer: Medicaid Other | Admitting: Unknown Physician Specialty

## 2016-04-24 ENCOUNTER — Ambulatory Visit: Payer: Medicaid Other | Admitting: Unknown Physician Specialty

## 2016-05-11 ENCOUNTER — Ambulatory Visit: Payer: Medicaid Other | Admitting: Unknown Physician Specialty

## 2016-07-02 ENCOUNTER — Observation Stay
Admission: EM | Admit: 2016-07-02 | Discharge: 2016-07-03 | Disposition: A | Discharge status: Home / Self Care | Payer: Medicaid Other | Attending: Internal Medicine | Admitting: Internal Medicine

## 2016-07-02 ENCOUNTER — Emergency Department: Payer: Medicaid Other

## 2016-07-02 ENCOUNTER — Observation Stay: Payer: Medicaid Other

## 2016-07-02 DIAGNOSIS — N400 Enlarged prostate without lower urinary tract symptoms: Secondary | ICD-10-CM | POA: Insufficient documentation

## 2016-07-02 DIAGNOSIS — I69354 Hemiplegia and hemiparesis following cerebral infarction affecting left non-dominant side: Secondary | ICD-10-CM | POA: Diagnosis not present

## 2016-07-02 DIAGNOSIS — E871 Hypo-osmolality and hyponatremia: Secondary | ICD-10-CM | POA: Insufficient documentation

## 2016-07-02 DIAGNOSIS — R739 Hyperglycemia, unspecified: Secondary | ICD-10-CM | POA: Insufficient documentation

## 2016-07-02 DIAGNOSIS — R55 Syncope and collapse: Secondary | ICD-10-CM

## 2016-07-02 DIAGNOSIS — Y900 Blood alcohol level of less than 20 mg/100 ml: Secondary | ICD-10-CM | POA: Insufficient documentation

## 2016-07-02 DIAGNOSIS — I10 Essential (primary) hypertension: Secondary | ICD-10-CM | POA: Diagnosis not present

## 2016-07-02 DIAGNOSIS — Z79899 Other long term (current) drug therapy: Secondary | ICD-10-CM | POA: Insufficient documentation

## 2016-07-02 DIAGNOSIS — F1010 Alcohol abuse, uncomplicated: Secondary | ICD-10-CM | POA: Diagnosis not present

## 2016-07-02 DIAGNOSIS — E860 Dehydration: Secondary | ICD-10-CM | POA: Insufficient documentation

## 2016-07-02 DIAGNOSIS — E785 Hyperlipidemia, unspecified: Secondary | ICD-10-CM | POA: Insufficient documentation

## 2016-07-02 DIAGNOSIS — R569 Unspecified convulsions: Secondary | ICD-10-CM | POA: Diagnosis present

## 2016-07-02 DIAGNOSIS — F17200 Nicotine dependence, unspecified, uncomplicated: Secondary | ICD-10-CM | POA: Insufficient documentation

## 2016-07-02 HISTORY — DX: Personal history of transient ischemic attack (TIA), and cerebral infarction without residual deficits: Z86.73

## 2016-07-02 HISTORY — DX: Alcohol abuse, uncomplicated: F10.10

## 2016-07-02 HISTORY — DX: Syncope and collapse: R55

## 2016-07-02 LAB — COMPREHENSIVE METABOLIC PANEL
ALT: 16 U/L — ABNORMAL LOW (ref 17–63)
AST: 47 U/L — ABNORMAL HIGH (ref 15–41)
Albumin: 4.6 g/dL (ref 3.5–5.0)
Alkaline Phosphatase: 95 U/L (ref 38–126)
Anion gap: 19 — ABNORMAL HIGH (ref 5–15)
BUN: 17 mg/dL (ref 6–20)
CO2: 14 mmol/L — ABNORMAL LOW (ref 22–32)
Calcium: 9.5 mg/dL (ref 8.9–10.3)
Chloride: 93 mmol/L — ABNORMAL LOW (ref 101–111)
Creatinine, Ser: 2.1 mg/dL — ABNORMAL HIGH (ref 0.61–1.24)
GFR calc Af Amer: 39 mL/min — ABNORMAL LOW (ref >60)
GFR calc non Af Amer: 33 mL/min — ABNORMAL LOW (ref >60)
Glucose, Bld: 258 mg/dL — ABNORMAL HIGH (ref 65–99)
Potassium: 4 mmol/L (ref 3.5–5.1)
Sodium: 126 mmol/L — ABNORMAL LOW (ref 135–145)
Total Bilirubin: 1.2 mg/dL (ref 0.3–1.2)
Total Protein: 8.6 g/dL — ABNORMAL HIGH (ref 6.5–8.1)

## 2016-07-02 LAB — URINALYSIS COMPLETE WITH MICROSCOPIC (ARMC ONLY)
Bilirubin Urine: NEGATIVE
Glucose, UA: 50 mg/dL — AB
Ketones, ur: NEGATIVE mg/dL
Leukocytes, UA: NEGATIVE
Nitrite: NEGATIVE
Protein, ur: NEGATIVE mg/dL
Specific Gravity, Urine: 1.008 (ref 1.005–1.030)
Squamous Epithelial / LPF: NONE SEEN
pH: 5 (ref 5.0–8.0)

## 2016-07-02 LAB — URINE DRUG SCREEN, QUALITATIVE (ARMC ONLY)
Amphetamines, Ur Screen: NOT DETECTED
Barbiturates, Ur Screen: NOT DETECTED
Benzodiazepine, Ur Scrn: NOT DETECTED
Cannabinoid 50 Ng, Ur ~~LOC~~: NOT DETECTED
Cocaine Metabolite,Ur ~~LOC~~: NOT DETECTED
MDMA (Ecstasy)Ur Screen: NOT DETECTED
Methadone Scn, Ur: NOT DETECTED
Opiate, Ur Screen: NOT DETECTED
Phencyclidine (PCP) Ur S: NOT DETECTED
Tricyclic, Ur Screen: NOT DETECTED

## 2016-07-02 LAB — GLUCOSE, CAPILLARY
Glucose-Capillary: 274 mg/dL — ABNORMAL HIGH (ref 65–99)
Glucose-Capillary: 134 mg/dL — ABNORMAL HIGH (ref 65–99)
Glucose-Capillary: 124 mg/dL — ABNORMAL HIGH (ref 65–99)

## 2016-07-02 LAB — ETHANOL: Alcohol, Ethyl (B): <5 mg/dL (ref <5)

## 2016-07-02 LAB — LIPASE, BLOOD: Lipase: 33 U/L (ref 11–51)

## 2016-07-02 LAB — MAGNESIUM: Magnesium: 1.9 mg/dL (ref 1.7–2.4)

## 2016-07-02 LAB — TROPONIN I: Troponin I: <0.03 ng/mL (ref <0.03)

## 2016-07-02 MED ORDER — VITAMIN B-1 100 MG PO TABS
100.0000 mg | ORAL_TABLET | Freq: Every day | ORAL | Status: DC
  Administered 2016-07-02 – 2016-07-03 (×2): 100 mg via ORAL
  Filled 2016-07-02: qty 1

## 2016-07-02 MED ORDER — AMLODIPINE BESYLATE 5 MG PO TABS
5.0000 mg | ORAL_TABLET | Freq: Every day | ORAL | Status: DC
  Administered 2016-07-02 – 2016-07-03 (×2): 5 mg via ORAL
  Filled 2016-07-02: qty 1

## 2016-07-02 MED ORDER — THIAMINE HCL 100 MG/ML IJ SOLN: 100.0000 mg | Freq: Every day | INTRAMUSCULAR | Status: DC

## 2016-07-02 MED ORDER — AMLODIPINE BESYLATE 5 MG PO TABS
ORAL_TABLET | ORAL | Status: AC
  Administered 2016-07-02: 5 mg
  Filled 2016-07-02: qty 1

## 2016-07-02 MED ORDER — FOLIC ACID 1 MG PO TABS
1.0000 mg | ORAL_TABLET | Freq: Every day | ORAL | Status: DC
  Administered 2016-07-02 – 2016-07-03 (×2): 1 mg via ORAL
  Filled 2016-07-02: qty 1

## 2016-07-02 MED ORDER — ATORVASTATIN CALCIUM 20 MG PO TABS
20.0000 mg | ORAL_TABLET | Freq: Every day | ORAL | Status: DC
  Administered 2016-07-02: 17:00:00 20 mg via ORAL
  Filled 2016-07-02: qty 1

## 2016-07-02 MED ORDER — HEPARIN SODIUM (PORCINE) 5000 UNIT/ML IJ SOLN
5000.0000 [IU] | Freq: Three times a day (TID) | INTRAMUSCULAR | Status: DC
  Administered 2016-07-02 – 2016-07-03 (×3): 5000 [IU] via SUBCUTANEOUS
  Filled 2016-07-02 (×2): qty 1

## 2016-07-02 MED ORDER — ADULT MULTIVITAMIN W/MINERALS CH
ORAL_TABLET | ORAL | Status: AC
  Filled 2016-07-02: qty 1

## 2016-07-02 MED ORDER — CARVEDILOL 25 MG PO TABS
25.0000 mg | ORAL_TABLET | Freq: Two times a day (BID) | ORAL | Status: DC
  Administered 2016-07-02 – 2016-07-03 (×2): 25 mg via ORAL
  Filled 2016-07-02 (×2): qty 1

## 2016-07-02 MED ORDER — SODIUM CHLORIDE 0.9 % IV SOLN
INTRAVENOUS | Status: DC
  Administered 2016-07-02 – 2016-07-03 (×3): via INTRAVENOUS

## 2016-07-02 MED ORDER — HEPARIN SODIUM (PORCINE) 5000 UNIT/ML IJ SOLN
INTRAMUSCULAR | Status: AC
  Filled 2016-07-02: qty 1

## 2016-07-02 MED ORDER — SODIUM CHLORIDE 0.9% FLUSH
3.0000 mL | Freq: Two times a day (BID) | INTRAVENOUS | Status: DC
  Administered 2016-07-02 – 2016-07-03 (×3): 3 mL via INTRAVENOUS

## 2016-07-02 MED ORDER — ACETAMINOPHEN 650 MG RE SUPP: 650.0000 mg | Freq: Four times a day (QID) | RECTAL | Status: DC | PRN

## 2016-07-02 MED ORDER — ADULT MULTIVITAMIN W/MINERALS CH
1.0000 | ORAL_TABLET | Freq: Every day | ORAL | Status: DC
  Administered 2016-07-02 – 2016-07-03 (×2): 1 via ORAL
  Filled 2016-07-02: qty 1

## 2016-07-02 MED ORDER — TAMSULOSIN HCL 0.4 MG PO CAPS
0.4000 mg | ORAL_CAPSULE | Freq: Every day | ORAL | Status: DC
  Administered 2016-07-02 – 2016-07-03 (×2): 0.4 mg via ORAL
  Filled 2016-07-02: qty 1

## 2016-07-02 MED ORDER — ACETAMINOPHEN 325 MG PO TABS: 650.0000 mg | ORAL_TABLET | Freq: Four times a day (QID) | ORAL | Status: DC | PRN

## 2016-07-02 MED ORDER — VITAMIN B-1 100 MG PO TABS
ORAL_TABLET | ORAL | Status: AC
  Administered 2016-07-02: 100 mg via ORAL
  Filled 2016-07-02: qty 1

## 2016-07-02 MED ORDER — TAMSULOSIN HCL 0.4 MG PO CAPS
ORAL_CAPSULE | ORAL | Status: AC
  Filled 2016-07-02: qty 1

## 2016-07-02 MED ORDER — INSULIN ASPART 100 UNIT/ML ~~LOC~~ SOLN
0.0000 [IU] | Freq: Three times a day (TID) | SUBCUTANEOUS | Status: DC
  Administered 2016-07-02: 1 [IU] via SUBCUTANEOUS
  Filled 2016-07-02: qty 1

## 2016-07-02 MED ORDER — LORAZEPAM 2 MG/ML IJ SOLN
1.0000 mg | Freq: Once | INTRAMUSCULAR | Status: AC
Start: 2016-07-02 — End: 2016-07-02
  Administered 2016-07-02: 1 mg via INTRAVENOUS
  Filled 2016-07-02: qty 1

## 2016-07-02 MED ORDER — LORAZEPAM 1 MG PO TABS: 1.0000 mg | ORAL_TABLET | Freq: Four times a day (QID) | ORAL | Status: DC | PRN

## 2016-07-02 MED ORDER — SODIUM CHLORIDE 0.9 % IV BOLUS (SEPSIS)
500.0000 mL | Freq: Once | INTRAVENOUS | Status: AC
  Administered 2016-07-02: 500 mL via INTRAVENOUS

## 2016-07-02 MED ORDER — LORAZEPAM 2 MG/ML IJ SOLN: 1.0000 mg | Freq: Four times a day (QID) | INTRAMUSCULAR | Status: DC | PRN

## 2016-07-02 MED ORDER — FOLIC ACID 1 MG PO TABS
ORAL_TABLET | ORAL | Status: AC
  Filled 2016-07-02: qty 1

## 2016-07-02 NOTE — ED Triage Notes (Signed)
Pt BIB EMS, reports he had witnessed seizure while at store, per bystanders no pulse was detected and CPR was started approx 1 minute, pt post-ictal upon EMS arrival.

## 2016-07-02 NOTE — H&P (Signed)
[facility] [place] [facility]   PATIENT NAME: Jaishon Krisher    MR#:  [id number]  DATE OF BIRTH:  1960-11-24  DATE OF ADMISSION:  07/02/2016  PRIMARY CARE PHYSICIAN: Gabriel Cirri, NP   REQUESTING/REFERRING PHYSICIAN: Ileana Roup MD  CHIEF COMPLAINT:   Chief Complaint  Patient presents with  . Seizures    HISTORY OF PRESENT ILLNESS: Noris Kulinski  is a 55 y.o. male with a known history of Alcohol abuse, history of CVA, hyperlipidemia and hypertension who reports that he tries to drink when he can get the money. But he drinks most days. His last drink was on Saturday. He was working cleaning a window when apparently he passed out and started foaming at the mouth. A bystander who was a EMS person started CPR on him and called the ambulance. When they arrived he was noted to have a pulse. Patient reports that he is not sure what happened this has never happened before he denies feeling feeling sick recently. He denies any chest pain or shortness of breath or palpitations.        PAST MEDICAL HISTORY:   Past Medical History:  Diagnosis Date  . Alcohol abuse   . Arthritis   . H/O: CVA (cerebrovascular accident)   . Hyperlipidemia   . Hypertension     PAST SURGICAL HISTORY: Past Surgical History:  Procedure Laterality Date  . none      SOCIAL HISTORY:  Social History  Substance Use Topics  . Smoking status: Current Some Day Smoker  . Smokeless tobacco: Never Used  . Alcohol use 6.0 oz/week    10 Standard drinks or equivalent per week     Comment: dialy    FAMILY HISTORY:  Family History  Problem Relation Age of Onset  . Diabetes Mother   . Stroke Mother   . Heart disease Maternal Grandmother   . Heart disease Maternal Grandfather     DRUG ALLERGIES: No Known Allergies  REVIEW OF SYSTEMS:   CONSTITUTIONAL: No fever, fatigue or weakness.  EYES: No blurred or double vision.  EARS, NOSE, AND THROAT: No tinnitus or ear pain.   RESPIRATORY: No cough, shortness of breath, wheezing or hemoptysis.  CARDIOVASCULAR: No chest pain, orthopnea, edema. Positive passing out episode earlier today GASTROINTESTINAL: No nausea, vomiting, diarrhea or abdominal pain.  GENITOURINARY: No dysuria, hematuria.  ENDOCRINE: No polyuria, nocturia,  HEMATOLOGY: No anemia, easy bruising or bleeding SKIN: No rash or lesion. MUSCULOSKELETAL: No joint pain or arthritis.   NEUROLOGIC: No tingling, numbness, positive left sided weakness.  PSYCHIATRY: No anxiety or depression.   MEDICATIONS AT HOME:  Prior to Admission medications   Medication Sig Start Date End Date Taking? Authorizing Provider  amLODipine (NORVASC) 5 MG tablet Take 1 tablet (5 mg total) by mouth daily. 11/17/15  Yes Gabriel Cirri, NP  atorvastatin (LIPITOR) 20 MG tablet Take 1 tablet (20 mg total) by mouth daily at 6 PM. Patient taking differently: Take 20 mg by mouth at bedtime.  04/19/16  Yes Gabriel Cirri, NP  carvedilol (COREG) 25 MG tablet Take 1 tablet (25 mg total) by mouth 2 (two) times daily with a meal. 04/19/16  Yes Gabriel Cirri, NP  lisinopril-hydrochlorothiazide (PRINZIDE,ZESTORETIC) 20-25 MG per tablet Take 1 tablet by mouth daily. 05/26/15  Yes Gabriel Cirri, NP  tamsulosin (FLOMAX) 0.4 MG CAPS capsule Take 1 capsule (0.4 mg total) by mouth daily. 03/20/16  Yes Gabriel Cirri, NP      PHYSICAL EXAMINATION:  VITAL SIGNS: Blood pressure (!) 144/85, pulse (!) 108, temperature 98.1 F (36.7 C), temperature source Oral, resp. rate 19, height 5\' 7"  (1.702 m), weight 222 lb 10.6 oz (101 kg), SpO2 97 %.  GENERAL:  55 y.o.-year-old patient lying in the bed with no acute distress.  EYES: Pupils equal, round, reactive to light and accommodation. No scleral icterus. Extraocular muscles intact.  HEENT: Head atraumatic, normocephalic. Oropharynx and nasopharynx clear.  NECK:  Supple, no jugular venous distention. No thyroid enlargement, no tenderness.  LUNGS: Normal breath  sounds bilaterally, no wheezing, rales,rhonchi or crepitation. No use of accessory muscles of respiration.  CARDIOVASCULAR: S1, S2 normal. No murmurs, rubs, or gallops.  ABDOMEN: Soft, nontender, nondistended. Bowel sounds present. No organomegaly or mass.  EXTREMITIES: No pedal edema, cyanosis, or clubbing.  NEUROLOGIC: Cranial nerves II through XII are intact. Muscle strength 4 /5 in left lower extremity. Sensation intact. Gait not checked.  PSYCHIATRIC: The patient is alert and oriented x 3.  SKIN: No obvious rash, lesion, or ulcer.   LABORATORY PANEL:   CBC No results for input(s): WBC, HGB, HCT, PLT, MCV, MCH, MCHC, RDW, LYMPHSABS, MONOABS, EOSABS, BASOSABS, BANDABS in the last 168 hours.  Invalid input(s): NEUTRABS, BANDSABD ------------------------------------------------------------------------------------------------------------------  Chemistries   Recent Labs Lab 07/02/16 1227  NA 126*  K 4.0  CL 93*  CO2 14*  GLUCOSE 258*  BUN 17  CREATININE 2.10*  CALCIUM 9.5  MG 1.9  AST 47*  ALT 16*  ALKPHOS 95  BILITOT 1.2   ------------------------------------------------------------------------------------------------------------------ estimated creatinine clearance is 44 mL/min (by C-G formula based on SCr of 2.1 mg/dL (H)). ------------------------------------------------------------------------------------------------------------------ No results for input(s): TSH, T4TOTAL, T3FREE, THYROIDAB in the last 72 hours.  Invalid input(s): FREET3   Coagulation profile No results for input(s): INR, PROTIME in the last 168 hours. ------------------------------------------------------------------------------------------------------------------- No results for input(s): DDIMER in the last 72 hours. -------------------------------------------------------------------------------------------------------------------  Cardiac Enzymes  Recent Labs Lab 07/02/16 1227  TROPONINI  <0.03   ------------------------------------------------------------------------------------------------------------------ Invalid input(s): POCBNP  ---------------------------------------------------------------------------------------------------------------  Urinalysis    Component Value Date/Time   COLORURINE Colorless 05/02/2014 0134   APPEARANCEUR Clear 05/02/2014 0134   LABSPEC 1.004 05/02/2014 0134   PHURINE 5.0 05/02/2014 0134   GLUCOSEU Negative 05/02/2014 0134   HGBUR 1+ 05/02/2014 0134   BILIRUBINUR Negative 05/02/2014 0134   KETONESUR Negative 05/02/2014 0134   PROTEINUR Negative 05/02/2014 0134   NITRITE Negative 05/02/2014 0134   LEUKOCYTESUR Negative 05/02/2014 0134     RADIOLOGY: Ct Head Wo Contrast  Result Date: 07/02/2016 CLINICAL DATA:  Seizure EXAM: CT HEAD WITHOUT CONTRAST TECHNIQUE: Contiguous axial images were obtained from the base of the skull through the vertex without intravenous contrast. COMPARISON:  MRI 05/02/2014.  CT 05/02/2014 FINDINGS: Brain: Ventricle size normal. Cerebral volume normal. Chronic hemorrhagic infarction in the right lateral basal ganglia unchanged from prior studies. Mild chronic microvascular ischemic change in the white matter. Negative for acute infarct. No acute hemorrhage or mass. No shift of the midline structures Vascular: No hyperdense vessel or unexpected calcification. Skull: Negative Sinuses/Orbits: Negative Other: Negative IMPRESSION: No acute intracranial abnormality Chronic microvascular ischemic change in the white matter. Chronic hemorrhagic infarct right lateral basal ganglia, unchanged from prior studies. Electronically Signed   By: Marlan Palau M.D.   On: 07/02/2016 13:01   Dg Chest Port 1 View  Result Date: 07/02/2016 CLINICAL DATA:  Possible syncopal event. History of previous CVA, hypertension. Patient had a witnessed seizure today. Patient is a current smoker and has history of hypertension and  hyperlipidemia.  EXAM: PORTABLE CHEST 1 VIEW COMPARISON:  Portable chest x-ray of July 18, 2010 FINDINGS: The lungs are adequately inflated and clear. The heart and pulmonary vascularity are normal. The mediastinum is normal in width. There is no pleural effusion. The bony thorax exhibits no acute abnormality. IMPRESSION: There is no active cardiopulmonary disease. Electronically Signed   By: David  SwazilandJordan M.D.   On: 07/02/2016 13:41    EKG: Orders placed or performed during the hospital encounter of 07/02/16  . ED EKG  . ED EKG  . EKG 12-Lead  . EKG 12-Lead    IMPRESSION AND PLAN: Patient is a 55 year old African-American male with alcohol abuse presents with syncope versus seizure 1. Acute episode of decreased responsiveness Suspect due to alcohol withdrawal seizure however syncope as a possibility At this point will place patient under observation and workup for syncope including echo and carotid Dopplers I will ask neurology to see, however likely no antiepileptics since its alcohol-induced seizure  2. Chronic kidney disease 3 has progression since 2015 IV fluids Likely would not use HCTZ.  3. Hypertension we'll continue his home regimen except hold HCTZ and lisinopril   4. Hyperglycemia with no history of diabetes Check a hemoglobin A1c  5. Hyponatremia likely due to combination of dehydration and HCTZ therapy Give IV fluids follow sodium  6. Alcohol abuse we'll place patient on [place] protocol  All the records are reviewed and case discussed with ED provider. Management plans discussed with the patient, family and they are in agreement.  CODE STATUS:    Code Status Orders        Start     Ordered   07/02/16 1354  Full code  Continuous     07/02/16 1354    Code Status History    Date Active Date Inactive Code Status Order ID Comments User Context   This patient has a current code status but no historical code status.       TOTAL TIME TAKING CARE OF THIS PATIENT:55 minutes.     Auburn BilberryPATEL, Laurell Coalson M.D on 07/02/2016 at 2:03 PM  Between 7am to 6pm - Pager - [phone]  After 6pm go to [email or web address] - password [email or web address]  [facility]  Office  [phone]  CC: Primary care physician; Gabriel Cirriheryl Wicker, NP

## 2016-07-02 NOTE — ED Notes (Signed)
Patient transported to CT 

## 2016-07-02 NOTE — ED Provider Notes (Signed)
[facility] Emergency Department Provider Note  ____________________________________________   I have reviewed the triage vital signs and the nursing notes.   HISTORY  Chief Complaint Seizures    HPI Brad Graham is a 55 y.o. male who presents today after what was described as a questionable syncopal event. He has a history of hypertension and arthritis and remote CVA he states with no residual. He was cleaning a window on the ground floor apparently and had a witnessed seizure. A bystander thought that he had no pulses pressed on his chest for a few seconds, he himself has no recollection of any of this and he has no complaint. He does drink alcohol and regular basis she's never had any alcohol withdrawal that he knows of. His last alcohol was a day or 2 ago. Denies any fever chills or headache. Did not bite his tongue. He should've incontinent some point however. He denies any focal numbness or weakness. He states he feels okay, has no complaints.     Past Medical History:  Diagnosis Date  . Arthritis   . Hyperlipidemia   . Hypertension     Patient Active Problem List   Diagnosis Date Noted  . Essential hypertension 05/10/2015  . Hyperglycemia 05/10/2015  . Hyperlipidemia 05/10/2015  . Proteinuria 05/10/2015    History reviewed. No pertinent surgical history.  Prior to Admission medications   Medication Sig Start Date End Date Taking? Authorizing Provider  amLODipine (NORVASC) 5 MG tablet Take 1 tablet (5 mg total) by mouth daily. 11/17/15   Gabriel Cirri, NP  atorvastatin (LIPITOR) 20 MG tablet Take 1 tablet (20 mg total) by mouth daily at 6 PM. 04/19/16   Gabriel Cirri, NP  carvedilol (COREG) 25 MG tablet Take 1 tablet (25 mg total) by mouth 2 (two) times daily with a meal. 04/19/16   Gabriel Cirri, NP  lisinopril-hydrochlorothiazide (PRINZIDE,ZESTORETIC) 20-25 MG per tablet Take 1 tablet by mouth daily. 05/26/15   Gabriel Cirri, NP  tamsulosin  (FLOMAX) 0.4 MG CAPS capsule Take 1 capsule (0.4 mg total) by mouth daily. 03/20/16   Gabriel Cirri, NP    Allergies Review of patient's allergies indicates no known allergies.  Family History  Problem Relation Age of Onset  . Diabetes Mother   . Stroke Mother   . Heart disease Maternal Grandmother   . Heart disease Maternal Grandfather     Social History Social History  Substance Use Topics  . Smoking status: Current Some Day Smoker  . Smokeless tobacco: Never Used  . Alcohol use 0.0 oz/week     Comment: on occasion    Review of Systems Constitutional: No fever/chills Eyes: No visual changes. ENT: No sore throat. No stiff neck no neck pain Cardiovascular: Denies chest pain. Respiratory: Denies shortness of breath. Gastrointestinal:   no vomiting.  No diarrhea.  No constipation. Genitourinary: Negative for dysuria. Musculoskeletal: Negative lower extremity swelling Skin: Negative for rash. Neurological: Negative for severe headaches, focal weakness or numbness. 10-point ROS otherwise negative.  ____________________________________________   PHYSICAL EXAM:  VITAL SIGNS: ED Triage Vitals  Enc Vitals Group     BP 07/02/16 1233 (!) 144/85     Pulse Rate 07/02/16 1233 (!) 108     Resp 07/02/16 1233 19     Temp 07/02/16 1233 98.1 F (36.7 C)     Temp Source 07/02/16 1233 Oral     SpO2 07/02/16 1233 97 %     Weight 07/02/16 1224 222 lb 10.6 oz (101 kg)  Height 07/02/16 1224 5\' 7"  (1.702 m)     Head Circumference --      Peak Flow --      Pain Score --      Pain Loc --      Pain Edu? --      Excl. in GC? --     Constitutional: Alert and oriented. Well appearing and in no acute distress.Very poorly kempt Eyes: Conjunctivae are normal. PERRL. EOMI. Head: Atraumatic. Nose: No congestion/rhinnorhea. Mouth/Throat: Mucous membranes are moist.  Oropharynx non-erythematous. Neck: No stridor.   Nontender with no meningismus Cardiovascular: Normal rate, regular  rhythm. Grossly normal heart sounds.  Good peripheral circulation. Respiratory: Normal respiratory effort.  No retractions. Lungs CTAB. Abdominal: Soft and nontender. No distention. No guarding no rebound Back:  There is no focal tenderness or step off.  there is no midline tenderness there are no lesions noted. there is no CVA tenderness  Musculoskeletal: No lower extremity tenderness, no upper extremity tenderness. No joint effusions, no DVT signs strong distal pulses no edema Neurologic:  Normal speech and language. No gross focal neurologic deficits are appreciated.  Skin:  Skin is warm, dry and intact. No rash noted. Psychiatric: Mood and affect are normal. Speech and behavior are normal.  ____________________________________________   LABS (all labs ordered are listed, but only abnormal results are displayed)  Labs Reviewed  GLUCOSE, CAPILLARY - Abnormal; Notable for the following:       Result Value   Glucose-Capillary 274 (*)    All other components within normal limits  COMPREHENSIVE METABOLIC PANEL  ETHANOL  TROPONIN I  LIPASE, BLOOD  URINE DRUG SCREEN, QUALITATIVE ([facility] ONLY)  URINALYSIS COMPLETEWITH MICROSCOPIC ([facility] ONLY)  MAGNESIUM  CBG MONITORING, ED   ____________________________________________  EKG  I personally interpreted any EKGs ordered by me or triage Sinus tach rate 109 no acute ST elevation or depression normal axis unremarkable EKG except for mild tachycardia ____________________________________________  RADIOLOGY  I reviewed any imaging ordered by me or triage that were performed during my shift and, if possible, patient and/or family made aware of any abnormal findings. ____________________________________________   PROCEDURES  Procedure(s) performed: None  Procedures  Critical Care performed: None  ____________________________________________   INITIAL IMPRESSION / ASSESSMENT AND PLAN / ED COURSE  Pertinent labs & imaging results  that were available during my care of the patient were reviewed by me and considered in my medical decision making (see chart for details).  Patient had a witnessed seizure apparently grand mal today with some evidence of incontinence of bladder he is neurologically intact to obtain a CT scan, it is unclear whether or not the patient had true pulselessness or not because it was a nonmedical provider who thought he did. He had pulses when EMS arrived in any event. We are giving him IV fluids. He doesn't history of significant alcohol abuse to the extent that I can determine, he is mildly tachycardic but not tremulous is clearly could be an alcohol withdrawal seizure, we will give him Ativan as a pre-caution, and we will obtain magnesium and blood work and urine drug screen if possible and reassess. Blood glucose was normal for paramedics.  Clinical Course   ____________________________________________   FINAL CLINICAL IMPRESSION(S) / ED DIAGNOSES  Final diagnoses:  None      This chart was dictated using voice recognition software.  Despite best efforts to proofread,  errors can occur which can change meaning.      Jeanmarie PlantJames A McShane, MD 07/02/16  1254  

## 2016-07-03 ENCOUNTER — Observation Stay: Payer: Medicaid Other

## 2016-07-03 ENCOUNTER — Observation Stay
Admit: 2016-07-03 | Discharge: 2016-07-03 | Disposition: A | Discharge status: Home / Self Care | Payer: Medicaid Other | Attending: Internal Medicine | Admitting: Internal Medicine

## 2016-07-03 DIAGNOSIS — R55 Syncope and collapse: Secondary | ICD-10-CM | POA: Diagnosis not present

## 2016-07-03 LAB — CBC
HCT: 36.1 % — ABNORMAL LOW (ref 40.0–52.0)
Hemoglobin: 11.8 g/dL — ABNORMAL LOW (ref 13.0–18.0)
MCH: 29.7 pg (ref 26.0–34.0)
MCHC: 32.8 g/dL (ref 32.0–36.0)
MCV: 90.5 fL (ref 80.0–100.0)
Platelets: 170 10*3/uL (ref 150–440)
RBC: 3.99 MIL/uL — ABNORMAL LOW (ref 4.40–5.90)
RDW: 14.2 % (ref 11.5–14.5)
WBC: 4.1 10*3/uL (ref 3.8–10.6)

## 2016-07-03 LAB — HEMOGLOBIN A1C
Hgb A1c MFr Bld: 6.2 % — ABNORMAL HIGH (ref 4.8–5.6)
Mean Plasma Glucose: 131 mg/dL

## 2016-07-03 LAB — BASIC METABOLIC PANEL
Anion gap: 6 (ref 5–15)
BUN: 15 mg/dL (ref 6–20)
CO2: 24 mmol/L (ref 22–32)
Calcium: 9.1 mg/dL (ref 8.9–10.3)
Chloride: 101 mmol/L (ref 101–111)
Creatinine, Ser: 1.37 mg/dL — ABNORMAL HIGH (ref 0.61–1.24)
GFR calc Af Amer: >60 mL/min (ref >60)
GFR calc non Af Amer: 56 mL/min — ABNORMAL LOW (ref >60)
Glucose, Bld: 105 mg/dL — ABNORMAL HIGH (ref 65–99)
Potassium: 4 mmol/L (ref 3.5–5.1)
Sodium: 131 mmol/L — ABNORMAL LOW (ref 135–145)

## 2016-07-03 LAB — GLUCOSE, CAPILLARY
Glucose-Capillary: 98 mg/dL (ref 65–99)
Glucose-Capillary: 166 mg/dL — ABNORMAL HIGH (ref 65–99)

## 2016-07-03 MED ORDER — ASPIRIN 325 MG PO TBEC: 325.0000 mg | DELAYED_RELEASE_TABLET | Freq: Every day | ORAL | 2 refills | Status: DC

## 2016-07-03 MED ORDER — LISINOPRIL 20 MG PO TABS: 20.0000 mg | ORAL_TABLET | Freq: Every day | ORAL | 2 refills | Status: DC

## 2016-07-03 MED ORDER — ASPIRIN EC 325 MG PO TBEC
325.0000 mg | DELAYED_RELEASE_TABLET | Freq: Every day | ORAL | Status: DC
  Administered 2016-07-03: 325 mg via ORAL
  Filled 2016-07-03: qty 1

## 2016-07-03 NOTE — Progress Notes (Signed)
  Echocardiogram 2D Echocardiogram has been performed.  Brad Graham Monica 07/03/2016, 3:16 PM

## 2016-07-03 NOTE — Progress Notes (Signed)
Discharge paperwork reviewed with patient who verbalized understanding. Prescriptions for lisinopril and aspirin given to patient. Patient's friend, Genia PlantsGriffin McClure to transport patient home.

## 2016-07-03 NOTE — Care Management Note (Signed)
Case Management Note  Patient Details  Name: Brad DuttonDwayne A Drumwright MRN: [id number] Date of Birth: 09/10/1961  Subjective/Objective:    Out of room for an echo. Will discuss MOON when he returns.                Action/Plan:   Expected Discharge Date:                  Expected Discharge Plan:     In-House Referral:     Discharge planning Services     Post Acute Care Choice:    Choice offered to:     DME Arranged:    DME Agency:     HH Arranged:    HH Agency:     Status of Service:     If discussed at [employer] Length of Stay Meetings, dates discussed:    Additional Comments:  Erlean Mealor A, RN 07/03/2016, 10:12 AM

## 2016-07-03 NOTE — Discharge Summary (Signed)
[facility] [place] [facility]   PATIENT NAME: Brad Graham    MR#:  [id number]  DATE OF BIRTH:  [birth year]  DATE OF ADMISSION:  07/02/2016   ADMITTING PHYSICIAN: Auburn Bilberry, MD  DATE OF DISCHARGE: 07/03/16  PRIMARY CARE PHYSICIAN: Gabriel Cirri, NP   ADMISSION DIAGNOSIS:   Seizure (HCC) [R56.9] Syncope [R55]  DISCHARGE DIAGNOSIS:   Active Problems:   Syncope   SECONDARY DIAGNOSIS:   Past Medical History:  Diagnosis Date  . Alcohol abuse   . Arthritis   . H/O: CVA (cerebrovascular accident)   . Hyperlipidemia   . Hypertension     HOSPITAL COURSE:   55 y/o M with PMH of CVA, alcohol abuse presents to the hospitalSecondary to a syncopal episode.  #1 syncope-likely from hyponatremia and dehydration. -Less likely to be a seizure. -Received IV fluids. Discontinue hydrochlorothiazide -EEG is negative. Appreciate neurology consult. -Carotid Dopplers with no significant stenosis. Echo is done and results are pending. No arrhythmias noted on the monitor.  #2 history of CVA- slow to respond, stuttering speech. Otherwise no focal significant neurological deficits. Has a cane to ambulate. -No new deficits. Aspirin started at discharge.  #3 hypertension-continue Coreg and Norvasc. Lisinopril also continued but hydrochlorothiazide stopped  #4 BPH-Flomax  #5 hyperlipidemia-on statin  Stable for discharge today  DISCHARGE CONDITIONS:   Guarded  CONSULTS OBTAINED:   Treatment Team:  Thana Farr, MD  DRUG ALLERGIES:   No Known Allergies DISCHARGE MEDICATIONS:     Medication List    STOP taking these medications   lisinopril-hydrochlorothiazide 20-25 MG tablet Commonly known as:  PRINZIDE,ZESTORETIC     TAKE these medications   amLODipine 5 MG tablet Commonly known as:  NORVASC Take 1 tablet (5 mg total) by mouth daily.   aspirin 325 MG EC tablet Take 1 tablet (325 mg total) by mouth daily.   atorvastatin 20 MG  tablet Commonly known as:  LIPITOR Take 1 tablet (20 mg total) by mouth daily at 6 PM. What changed:  when to take this   carvedilol 25 MG tablet Commonly known as:  COREG Take 1 tablet (25 mg total) by mouth 2 (two) times daily with a meal.   lisinopril 20 MG tablet Commonly known as:  PRINIVIL,ZESTRIL Take 1 tablet (20 mg total) by mouth daily.   tamsulosin 0.4 MG Caps capsule Commonly known as:  FLOMAX Take 1 capsule (0.4 mg total) by mouth daily.        DISCHARGE INSTRUCTIONS:   1. PCP f/u in 1-2 weeks 2. Advised stop drinking alcohol  DIET:   Cardiac diet  ACTIVITY:   Activity as tolerated  OXYGEN:   Home Oxygen: No.  Oxygen Delivery: room air  DISCHARGE LOCATION:   home   If you experience worsening of your admission symptoms, develop shortness of breath, life threatening emergency, suicidal or homicidal thoughts you must seek medical attention immediately by calling 911 or calling your MD immediately  if symptoms less severe.  You Must read complete instructions/literature along with all the possible adverse reactions/side effects for all the Medicines you take and that have been prescribed to you. Take any new Medicines after you have completely understood and accpet all the possible adverse reactions/side effects.   Please note  You were cared for by a hospitalist during your hospital stay. If you have any questions about your discharge medications or the care you received while you were in the hospital after you are discharged, you can call  the unit and asked to speak with the hospitalist on call if the hospitalist that took care of you is not available. Once you are discharged, your primary care physician will handle any further medical issues. Please note that NO REFILLS for any discharge medications will be authorized once you are discharged, as it is imperative that you return to your primary care physician (or establish a relationship with a primary  care physician if you do not have one) for your aftercare needs so that they can reassess your need for medications and monitor your lab values.    On the day of Discharge:  VITAL SIGNS:   Blood pressure 138/86, pulse 82, temperature 98.2 F (36.8 C), temperature source Oral, resp. rate 16, height 5\' 7"  (1.702 m), weight 101 kg (222 lb 10.6 oz), SpO2 97 %.  PHYSICAL EXAMINATION:    GENERAL:  55 y.o.-year-old patient lying in the bed with no acute distress.  EYES: Pupils equal, round, reactive to light and accommodation. No scleral icterus. Extraocular muscles intact.  HEENT: Head atraumatic, normocephalic. Oropharynx and nasopharynx clear.  NECK:  Supple, no jugular venous distention. No thyroid enlargement, no tenderness.  LUNGS: Normal breath sounds bilaterally, no wheezing, rales,rhonchi or crepitation. No use of accessory muscles of respiration.  CARDIOVASCULAR: S1, S2 normal. No murmurs, rubs, or gallops.  ABDOMEN: Soft, non-tender, non-distended. Bowel sounds present. No organomegaly or mass.  EXTREMITIES: No pedal edema, cyanosis, or clubbing.  NEUROLOGIC: Cranial nerves II through XII are intact. Muscle strength 5/5 in all extremities. Sensation intact. Gait not checked. Stuttered speech. PSYCHIATRIC: The patient is alert and oriented x 3.  SKIN: No obvious rash, lesion, or ulcer.   DATA REVIEW:   CBC  Recent Labs Lab 07/03/16 0600  WBC 4.1  HGB 11.8*  HCT 36.1*  PLT 170    Chemistries   Recent Labs Lab 07/02/16 1227 07/03/16 0600  NA 126* 131*  K 4.0 4.0  CL 93* 101  CO2 14* 24  GLUCOSE 258* 105*  BUN 17 15  CREATININE 2.10* 1.37*  CALCIUM 9.5 9.1  MG 1.9  --   AST 47*  --   ALT 16*  --   ALKPHOS 95  --   BILITOT 1.2  --      Microbiology Results  No results found for this or any previous visit.  RADIOLOGY:  [place] Carotid Bilateral  Result Date: 07/02/2016 CLINICAL DATA:  Syncope. EXAM: BILATERAL CAROTID DUPLEX ULTRASOUND TECHNIQUE: Wallace CullensGray scale  imaging, color Doppler and duplex ultrasound were performed of bilateral carotid and vertebral arteries in the neck. COMPARISON:  05/02/2014 FINDINGS: Criteria: Quantification of carotid stenosis is based on velocity parameters that correlate the residual internal carotid diameter with NASCET-based stenosis levels, using the diameter of the distal internal carotid lumen as the denominator for stenosis measurement. The following velocity measurements were obtained: RIGHT ICA:  82 cm/sec CCA:  104 cm/sec SYSTOLIC ICA/CCA RATIO:  0.8 DIASTOLIC ICA/CCA RATIO:  2.1 ECA:  78 cm/sec LEFT ICA:  95 cm/sec CCA:  69 cm/sec SYSTOLIC ICA/CCA RATIO:  1.4 DIASTOLIC ICA/CCA RATIO:  3.1 ECA:  63 cm/sec RIGHT CAROTID ARTERY: Echogenic plaque at the right carotid bulb and proximal internal carotid artery. Normal waveforms and velocities in the right internal carotid artery. RIGHT VERTEBRAL ARTERY: Antegrade flow and normal waveform in the right vertebral artery. LEFT CAROTID ARTERY: Again noted is heterogeneous plaque at the left carotid bulb. Small amount of irregular echogenic plaque at the proximal internal carotid artery. Normal waveforms and  velocities in left internal carotid artery. LEFT VERTEBRAL ARTERY: Antegrade flow and normal waveform in the left vertebral artery. IMPRESSION: Mild atherosclerotic disease in the bilateral carotid arteries. Estimated degree of stenosis in the internal carotid arteries is less than 50% bilaterally. Patent vertebral arteries. Electronically Signed   By: Richarda Overlie M.D.   On: 07/02/2016 15:07     Management plans discussed with the patient, family and they are in agreement.  CODE STATUS:     Code Status Orders        Start     Ordered   07/02/16 1354  Full code  Continuous     07/02/16 1354    Code Status History    Date Active Date Inactive Code Status Order ID Comments User Context   This patient has a current code status but no historical code status.      TOTAL TIME  TAKING CARE OF THIS PATIENT: 37 minutes.    Marlo Arriola M.D on 07/03/2016 at 2:17 PM  Between 7am to 6pm - Pager - [phone]  After 6pm go to [email or web address] - Social research officer, government  [facility] [place] [facility]  Office  [phone]  CC: Primary care physician; Gabriel Cirri, NP   Note: This dictation was prepared with Dragon dictation along with smaller phrase technology. Any transcriptional errors that result from this process are unintentional.

## 2016-07-03 NOTE — Progress Notes (Signed)
Chaplain was on the way to visit a patient in [address] when the Pt stopped him. Pt told chaplain that he met Ch before and wanted to let Ch know he was doing well. Ch wished the Pt well as he was getting ready to be discharged.    07/03/16 1900  Clinical Encounter Type  Visited With Patient  Visit Type Initial;Spiritual support  Spiritual Encounters  Spiritual Needs Prayer

## 2016-07-03 NOTE — Consult Note (Signed)
Reason for Consult:Syncope Referring Physician: Nemiah Commander  CC: Syncope  HPI: Brad Graham is an 55 y.o. male with a history of stroke and ETOH abuse who reports that he reported to get some work done on yesterday. Was told what happened from there.  He is amnestic of the event.  Reports having had no previous similar events and no warning that something was about to happen on yesterday.  Per chart patient was noted to pass out and have foaming at the mouth.  A bystander started CPR and when EMS arrived patient had a pulse and was brought in for evaluation.  Patient feels he is at baseline.   Patient with a history of stroke and residual left sided weakness.  Uses a cane to walk. History of ETOH abuse, last drink was on Saturday.     Past Medical History:  Diagnosis Date  . Alcohol abuse   . Arthritis   . H/O: CVA (cerebrovascular accident)   . Hyperlipidemia   . Hypertension     Past Surgical History:  Procedure Laterality Date  . none      Family History  Problem Relation Age of Onset  . Diabetes Mother   . Stroke Mother   . Heart disease Maternal Grandmother   . Heart disease Maternal Grandfather     Social History:  reports that he has been smoking.  He has never used smokeless tobacco. He reports that he drinks about 6.0 oz of alcohol per week . He reports that he does not use drugs.  No Known Allergies  Medications:  I have reviewed the patient's current medications. Prior to Admission:  Prescriptions Prior to Admission  Medication Sig Dispense Refill Last Dose  . amLODipine (NORVASC) 5 MG tablet Take 1 tablet (5 mg total) by mouth daily. 90 tablet 0 07/02/2016 at am  . atorvastatin (LIPITOR) 20 MG tablet Take 1 tablet (20 mg total) by mouth daily at 6 PM. (Patient taking differently: Take 20 mg by mouth at bedtime. ) 30 tablet 1 07/01/2016 at pm  . carvedilol (COREG) 25 MG tablet Take 1 tablet (25 mg total) by mouth 2 (two) times daily with a meal. 60 tablet 1  07/02/2016 at 0700  . lisinopril-hydrochlorothiazide (PRINZIDE,ZESTORETIC) 20-25 MG per tablet Take 1 tablet by mouth daily. 90 tablet 1 07/02/2016 at am  . tamsulosin (FLOMAX) 0.4 MG CAPS capsule Take 1 capsule (0.4 mg total) by mouth daily. 30 capsule 0 07/02/2016 at am   Scheduled: . amLODipine  5 mg Oral Daily  . atorvastatin  20 mg Oral q1800  . carvedilol  25 mg Oral BID WC  . folic acid  1 mg Oral Daily  . heparin  5,000 Units Subcutaneous Q8H  . insulin aspart  0-9 Units Subcutaneous TID WC  . multivitamin with minerals  1 tablet Oral Daily  . sodium chloride flush  3 mL Intravenous Q12H  . tamsulosin  0.4 mg Oral Daily  . thiamine  100 mg Oral Daily    ROS: History obtained from the patient  General ROS: negative for - chills, fatigue, fever, night sweats, weight gain or weight loss Psychological ROS: negative for - behavioral disorder, hallucinations, memory difficulties, mood swings or suicidal ideation Ophthalmic ROS: negative for - blurry vision, double vision, eye pain or loss of vision ENT ROS: negative for - epistaxis, nasal discharge, oral lesions, sore throat, tinnitus or vertigo Allergy and Immunology ROS: negative for - hives or itchy/watery eyes Hematological and Lymphatic ROS: negative for -  bleeding problems, bruising or swollen lymph nodes Endocrine ROS: negative for - galactorrhea, hair pattern changes, polydipsia/polyuria or temperature intolerance Respiratory ROS: negative for - cough, hemoptysis, shortness of breath or wheezing Cardiovascular ROS: negative for - chest pain, dyspnea on exertion, edema or irregular heartbeat Gastrointestinal ROS: negative for - abdominal pain, diarrhea, hematemesis, nausea/vomiting or stool incontinence Genito-Urinary ROS: negative for - dysuria, hematuria, incontinence or urinary frequency/urgency Musculoskeletal ROS: negative for - joint swelling or muscular weakness Neurological ROS: as noted in HPI Dermatological ROS:  negative for rash and skin lesion changes  Physical Examination: Blood pressure 138/86, pulse 82, temperature 98.2 F (36.8 C), temperature source Oral, resp. rate 16, height 5\' 7"  (1.702 m), weight 101 kg (222 lb 10.6 oz), SpO2 97 %.  HEENT-  Normocephalic, no lesions, without obvious abnormality.  Normal external eye and conjunctiva.  Normal TM's bilaterally.  Normal auditory canals and external ears. Normal external nose, mucus membranes and septum.  Normal pharynx. Cardiovascular- S1, S2 normal, pulses palpable throughout   Lungs- chest clear, no wheezing, rales, normal symmetric air entry Abdomen- soft, non-tender; bowel sounds normal; no masses,  no organomegaly Extremities- no edema Lymph-no adenopathy palpable Musculoskeletal-no joint tenderness, deformity or swelling Skin-warm and dry, no hyperpigmentation, vitiligo, or suspicious lesions  Neurological Examination Mental Status: Alert, oriented, thought content appropriate.  Speech fluent with some stuttering.  Able to follow 3 step commands without difficulty. Cranial Nerves: II: Discs flat bilaterally; Visual fields grossly normal, pupils equal, round, reactive to light and accommodation III,IV, VI: ptosis not present, extra-ocular motions intact bilaterally V,VII: mild left facial droop, facial light touch sensation normal bilaterally VIII: hearing normal bilaterally IX,X: gag reflex present XI: bilateral shoulder shrug XII: midline tongue extension Motor: Right : Upper extremity   5/5    Left:     Upper extremity   5-/5 with pronator drift  Lower extremity   5/5     Lower extremity   5-/5 Tone and bulk:normal tone throughout; no atrophy noted Sensory: Pinprick and light touch intact throughout, bilaterally Deep Tendon Reflexes: 2+ and symmetric with absent AJ's bilaterally Plantars: Right: downgoing   Left: downgoing Cerebellar: Normal finger-to-nose testing bilaterally.  Dysmetria with heel-to-shin testing on the  left Gait: not tested due to safety concerns   Laboratory Studies:   Basic Metabolic Panel:  Recent Labs Lab 07/02/16 1227 07/03/16 0600  NA 126* 131*  K 4.0 4.0  CL 93* 101  CO2 14* 24  GLUCOSE 258* 105*  BUN 17 15  CREATININE 2.10* 1.37*  CALCIUM 9.5 9.1  MG 1.9  --     Liver Function Tests:  Recent Labs Lab 07/02/16 1227  AST 47*  ALT 16*  ALKPHOS 95  BILITOT 1.2  PROT 8.6*  ALBUMIN 4.6    Recent Labs Lab 07/02/16 1227  LIPASE 33   No results for input(s): AMMONIA in the last 168 hours.  CBC:  Recent Labs Lab 07/03/16 0600  WBC 4.1  HGB 11.8*  HCT 36.1*  MCV 90.5  PLT 170    Cardiac Enzymes:  Recent Labs Lab 07/02/16 1227  TROPONINI <0.03    BNP: Invalid input(s): POCBNP  CBG:  Recent Labs Lab 07/02/16 1229 07/02/16 1735 07/02/16 2129 07/03/16 0747  GLUCAP 274* 134* 124* 98    Microbiology: No results found for this or any previous visit.  Coagulation Studies: No results for input(s): LABPROT, INR in the last 72 hours.  Urinalysis:  Recent Labs Lab 07/02/16 1228  COLORURINE YELLOW*  LABSPEC 1.008  PHURINE 5.0  GLUCOSEU 50*  HGBUR 1+*  BILIRUBINUR NEGATIVE  KETONESUR NEGATIVE  PROTEINUR NEGATIVE  NITRITE NEGATIVE  LEUKOCYTESUR NEGATIVE    Lipid Panel:     Component Value Date/Time   CHOL 176 08/15/2015 1340   CHOL CANCELED 08/15/2015 1340   TRIG 50 08/15/2015 1340   TRIG CANCELED 08/15/2015 1340   HDL 131 08/15/2015 1340   VLDL 34 (H) 05/10/2015 1534   LDLCALC 35 08/15/2015 1340    HgbA1C:  Lab Results  Component Value Date   HGBA1C 6.2 (H) 07/02/2016    Urine Drug Screen:     Component Value Date/Time   LABOPIA NONE DETECTED 07/02/2016 1610   COCAINSCRNUR NONE DETECTED 07/02/2016 1610   LABBENZ NONE DETECTED 07/02/2016 1610   AMPHETMU NONE DETECTED 07/02/2016 1610   THCU NONE DETECTED 07/02/2016 1610   LABBARB NONE DETECTED 07/02/2016 1610    Alcohol Level:  Recent Labs Lab 07/02/16 1227   ETH <5    Other results: EKG: sinus tachycardia at 109 bpm.  Imaging: Ct Head Wo Contrast  Result Date: 07/02/2016 CLINICAL DATA:  Seizure EXAM: CT HEAD WITHOUT CONTRAST TECHNIQUE: Contiguous axial images were obtained from the base of the skull through the vertex without intravenous contrast. COMPARISON:  MRI 05/02/2014.  CT 05/02/2014 FINDINGS: Brain: Ventricle size normal. Cerebral volume normal. Chronic hemorrhagic infarction in the right lateral basal ganglia unchanged from prior studies. Mild chronic microvascular ischemic change in the white matter. Negative for acute infarct. No acute hemorrhage or mass. No shift of the midline structures Vascular: No hyperdense vessel or unexpected calcification. Skull: Negative Sinuses/Orbits: Negative Other: Negative IMPRESSION: No acute intracranial abnormality Chronic microvascular ischemic change in the white matter. Chronic hemorrhagic infarct right lateral basal ganglia, unchanged from prior studies. Electronically Signed   By: Marlan Palau M.D.   On: 07/02/2016 13:01   US Carotid Bilateral  Result Date: 07/02/2016 CLINICAL DATA:  Syncope. EXAM: BILATERAL CAROTID DUPLEX ULTRASOUND TECHNIQUE: Wallace Cullens scale imaging, color Doppler and duplex ultrasound were performed of bilateral carotid and vertebral arteries in the neck. COMPARISON:  05/02/2014 FINDINGS: Criteria: Quantification of carotid stenosis is based on velocity parameters that correlate the residual internal carotid diameter with NASCET-based stenosis levels, using the diameter of the distal internal carotid lumen as the denominator for stenosis measurement. The following velocity measurements were obtained: RIGHT ICA:  82 cm/sec CCA:  104 cm/sec SYSTOLIC ICA/CCA RATIO:  0.8 DIASTOLIC ICA/CCA RATIO:  2.1 ECA:  78 cm/sec LEFT ICA:  95 cm/sec CCA:  69 cm/sec SYSTOLIC ICA/CCA RATIO:  1.4 DIASTOLIC ICA/CCA RATIO:  3.1 ECA:  63 cm/sec RIGHT CAROTID ARTERY: Echogenic plaque at the right carotid bulb  and proximal internal carotid artery. Normal waveforms and velocities in the right internal carotid artery. RIGHT VERTEBRAL ARTERY: Antegrade flow and normal waveform in the right vertebral artery. LEFT CAROTID ARTERY: Again noted is heterogeneous plaque at the left carotid bulb. Small amount of irregular echogenic plaque at the proximal internal carotid artery. Normal waveforms and velocities in left internal carotid artery. LEFT VERTEBRAL ARTERY: Antegrade flow and normal waveform in the left vertebral artery. IMPRESSION: Mild atherosclerotic disease in the bilateral carotid arteries. Estimated degree of stenosis in the internal carotid arteries is less than 50% bilaterally. Patent vertebral arteries. Electronically Signed   By: Richarda Overlie M.D.   On: 07/02/2016 15:07   Dg Chest Port 1 View  Result Date: 07/02/2016 CLINICAL DATA:  Possible syncopal event. History of previous CVA, hypertension. Patient had a  witnessed seizure today. Patient is a current smoker and has history of hypertension and hyperlipidemia. EXAM: PORTABLE CHEST 1 VIEW COMPARISON:  Portable chest x-ray of July 18, 2010 FINDINGS: The lungs are adequately inflated and clear. The heart and pulmonary vascularity are normal. The mediastinum is normal in width. There is no pleural effusion. The bony thorax exhibits no acute abnormality. IMPRESSION: There is no active cardiopulmonary disease. Electronically Signed   By: David  SwazilandJordan M.D.   On: 07/02/2016 13:41     Assessment/Plan: 55 year old male with a history of stroke and ETOH abuse who presents after an episode of loss of consciousness.  Etiology unclear.  Now at baseline.  Patient has had no previous similar episodes.  Has a history of ETOH abuse with presenting ETOH level of less than 5.  Drug screen negative.  Head CT personally reviewed and shows chronic ischemic changes but no acute changes.  Carotid dopplers show no hemodynamically significant stenosis.  Although seizure on the  differential from either ETOH withdrawal or sequelae from past stroke, patient has had no previous similar events.  If EEG unremarkable AED therapy not indicated at this time.      Recommendations: 1.  EEG 2.  Patient with multiple stroke risk factors, would start ASA 325mg  daily.     Thana FarrLeslie Caelynn Marshman, MD Neurology [phone] 07/03/2016, 11:28 AM

## 2016-07-04 LAB — ECHOCARDIOGRAM COMPLETE
Height: 67 in
Weight: 3562.63 oz

## 2016-07-14 ENCOUNTER — Emergency Department: Payer: Medicaid Other

## 2016-07-14 DIAGNOSIS — F17200 Nicotine dependence, unspecified, uncomplicated: Secondary | ICD-10-CM | POA: Diagnosis not present

## 2016-07-14 DIAGNOSIS — I10 Essential (primary) hypertension: Secondary | ICD-10-CM | POA: Insufficient documentation

## 2016-07-14 DIAGNOSIS — R066 Hiccough: Secondary | ICD-10-CM | POA: Insufficient documentation

## 2016-07-14 DIAGNOSIS — Z79899 Other long term (current) drug therapy: Secondary | ICD-10-CM | POA: Diagnosis not present

## 2016-07-14 DIAGNOSIS — Z7982 Long term (current) use of aspirin: Secondary | ICD-10-CM | POA: Insufficient documentation

## 2016-07-14 NOTE — ED Triage Notes (Signed)
Patient reports having hiccups for several hours.

## 2016-07-14 NOTE — ED Notes (Signed)
Spoke with Dr Roxan Hockeyobinson regarding patient.  Chest x-ray order given, no other orders at this time.

## 2016-07-14 NOTE — ED Notes (Signed)
EMS pt from home with c/o hiccups for 3 hours; +ETOH; BP 178/106

## 2016-07-15 ENCOUNTER — Emergency Department
Admission: EM | Admit: 2016-07-15 | Discharge: 2016-07-15 | Disposition: A | Discharge status: Home / Self Care | Payer: Medicaid Other | Attending: Emergency Medicine | Admitting: Emergency Medicine

## 2016-07-15 DIAGNOSIS — R066 Hiccough: Secondary | ICD-10-CM

## 2016-07-15 NOTE — ED Provider Notes (Signed)
[facility] Emergency Department Provider Note  ____________________________________________   First MD Initiated Contact with Patient 07/15/16 0106     (approximate)  I have reviewed the triage vital signs and the nursing notes.   HISTORY  Chief Complaint Hiccups    HPI Brad Graham is a 55 y.o. male who presents for evaluation of hiccups.  It started about 4-5 hours ago and were reportedly severe.  They started acutely and resolved acutely.  He had some alcohol at home.  He drank water and continue doing so after he came to the emergency department and the symptoms resolved before getting into an exam room.  He denies fever/chills, chest pain, shortness of breath, nausea, vomiting, abdominal pain.  No other recent symptoms as documented in the review of systems below.   Past Medical History:  Diagnosis Date  . Alcohol abuse   . Arthritis   . H/O: CVA (cerebrovascular accident)   . Hyperlipidemia   . Hypertension     Patient Active Problem List   Diagnosis Date Noted  . Syncope 07/02/2016  . Essential hypertension 05/10/2015  . Hyperglycemia 05/10/2015  . Hyperlipidemia 05/10/2015  . Proteinuria 05/10/2015    Past Surgical History:  Procedure Laterality Date  . none      Prior to Admission medications   Medication Sig Start Date End Date Taking? Authorizing Provider  amLODipine (NORVASC) 5 MG tablet Take 1 tablet (5 mg total) by mouth daily. 11/17/15   Gabriel Cirri, NP  aspirin 325 MG EC tablet Take 1 tablet (325 mg total) by mouth daily. 07/03/16   Enid Baas, MD  atorvastatin (LIPITOR) 20 MG tablet Take 1 tablet (20 mg total) by mouth daily at 6 PM. Patient taking differently: Take 20 mg by mouth at bedtime.  04/19/16   Gabriel Cirri, NP  carvedilol (COREG) 25 MG tablet Take 1 tablet (25 mg total) by mouth 2 (two) times daily with a meal. 04/19/16   Gabriel Cirri, NP  lisinopril (PRINIVIL,ZESTRIL) 20 MG tablet Take 1 tablet (20  mg total) by mouth daily. 07/03/16   Enid Baas, MD  tamsulosin (FLOMAX) 0.4 MG CAPS capsule Take 1 capsule (0.4 mg total) by mouth daily. 03/20/16   Gabriel Cirri, NP    Allergies Review of patient's allergies indicates no known allergies.  Family History  Problem Relation Age of Onset  . Diabetes Mother   . Stroke Mother   . Heart disease Maternal Grandmother   . Heart disease Maternal Grandfather     Social History Social History  Substance Use Topics  . Smoking status: Current Some Day Smoker  . Smokeless tobacco: Never Used  . Alcohol use 6.0 oz/week    10 Standard drinks or equivalent per week     Comment: dialy    Review of Systems Constitutional: No fever/chills ENT: No sore throat. Cardiovascular: Denies chest pain. Respiratory: Denies shortness of breath. Gastrointestinal: No abdominal pain.  No nausea, no vomiting.  No diarrhea.  No constipation. Genitourinary: Negative for dysuria. Musculoskeletal: Negative for back pain. Skin: Negative for rash. Neurological: Negative for headaches, focal weakness or numbness.  10-point ROS otherwise negative.  ____________________________________________   PHYSICAL EXAM:  VITAL SIGNS: ED Triage Vitals [07/14/16 2159]  Enc Vitals Group     BP (!) 148/77     Pulse Rate 81     Resp 20     Temp 97.8 F (36.6 C)     Temp Source Oral     SpO2 99 %  Weight      Height      Head Circumference      Peak Flow      Pain Score      Pain Loc      Pain Edu?      Excl. in GC?     Constitutional: Alert and oriented. Well appearing and in no acute distress. Eyes: Conjunctivae are normal. PERRL. EOMI. Head: Atraumatic. Nose: No congestion/rhinnorhea. Mouth/Throat: Mucous membranes are moist.  Oropharynx non-erythematous. Neck: No stridor.  No meningeal signs.   Cardiovascular: Normal rate, regular rhythm. Good peripheral circulation. Grossly normal heart sounds. Respiratory: Normal respiratory effort.  No  retractions. Lungs CTAB. Gastrointestinal: Soft and nontender. No distention.  Musculoskeletal: No lower extremity tenderness nor edema. No gross deformities of extremities. Neurologic:  Normal speech and language. No gross focal neurologic deficits are appreciated.  Skin:  Skin is warm, dry and intact. No rash noted. Psychiatric: Mood and affect are normal. Speech and behavior are normal.  ____________________________________________   LABS (all labs ordered are listed, but only abnormal results are displayed)  Labs Reviewed - No data to display ____________________________________________  EKG  None - EKG not ordered by ED physician ____________________________________________  RADIOLOGY   Dg Chest 2 View  Result Date: 07/14/2016 CLINICAL DATA:  Patient stated that he had 2-3 drinks prior to 1900hrs and around that time he started to hiccup. He has been hiccupping for 4+ hours at the time of imaging. Patient had a syncopal episode on 10/09 HX alcohol abuse, CVA, htn, smoker EXAM: CHEST  2 VIEW COMPARISON:  07/02/2016 FINDINGS: Heart is enlarged. There is mild pulmonary vascular congestion. No overt edema. There are no focal consolidations or pleural effusions. There is a wedge compression fracture of an upper lumbar vertebral level, likely L2 which appears chronic. IMPRESSION: Cardiomegaly and pulmonary vascular congestion. Electronically Signed   By: Norva PavlovElizabeth  Brown M.D.   On: 07/14/2016 23:59    ____________________________________________   PROCEDURES  Procedure(s) performed:   Procedures   Critical Care performed: No ____________________________________________   INITIAL IMPRESSION / ASSESSMENT AND PLAN / ED COURSE  Pertinent labs & imaging results that were available during my care of the patient were reviewed by me and considered in my medical decision making (see chart for details).  Asymptomatic.  No other treatment or evaluation  indicated.   ____________________________________________  FINAL CLINICAL IMPRESSION(S) / ED DIAGNOSES  Final diagnoses:  Hiccups     MEDICATIONS GIVEN DURING THIS VISIT:  Medications - No data to display   NEW OUTPATIENT MEDICATIONS STARTED DURING THIS VISIT:  New Prescriptions   No medications on file    Modified Medications   No medications on file    Discontinued Medications   No medications on file     Note:  This document was prepared using Dragon voice recognition software and may include unintentional dictation errors.    Loleta Roseory Rendon Howell, MD 07/15/16 [phone]

## 2016-07-15 NOTE — ED Notes (Signed)
Reviewed d/c instructions, follow-up care with pt and family. Pt and family verbalized understanding

## 2016-07-15 NOTE — ED Notes (Signed)
Pt reports earlier episode of hiccups. Denies any symptoms at this time.

## 2016-08-02 ENCOUNTER — Emergency Department: Payer: Medicaid Other

## 2016-08-02 ENCOUNTER — Inpatient Hospital Stay
Admission: EM | Admit: 2016-08-02 | Discharge: 2016-08-04 | DRG: 100 | Disposition: A | Discharge status: Home / Self Care | Payer: Medicaid Other | Attending: Internal Medicine | Admitting: Internal Medicine

## 2016-08-02 ENCOUNTER — Encounter: Payer: Self-pay | Admitting: Emergency Medicine

## 2016-08-02 DIAGNOSIS — E860 Dehydration: Secondary | ICD-10-CM | POA: Diagnosis present

## 2016-08-02 DIAGNOSIS — M1990 Unspecified osteoarthritis, unspecified site: Secondary | ICD-10-CM | POA: Diagnosis present

## 2016-08-02 DIAGNOSIS — R569 Unspecified convulsions: Secondary | ICD-10-CM

## 2016-08-02 DIAGNOSIS — R29898 Other symptoms and signs involving the musculoskeletal system: Secondary | ICD-10-CM

## 2016-08-02 DIAGNOSIS — G40409 Other generalized epilepsy and epileptic syndromes, not intractable, without status epilepticus: Principal | ICD-10-CM | POA: Diagnosis present

## 2016-08-02 DIAGNOSIS — F1010 Alcohol abuse, uncomplicated: Secondary | ICD-10-CM | POA: Diagnosis present

## 2016-08-02 DIAGNOSIS — Z8673 Personal history of transient ischemic attack (TIA), and cerebral infarction without residual deficits: Secondary | ICD-10-CM

## 2016-08-02 DIAGNOSIS — E871 Hypo-osmolality and hyponatremia: Secondary | ICD-10-CM | POA: Diagnosis present

## 2016-08-02 DIAGNOSIS — N400 Enlarged prostate without lower urinary tract symptoms: Secondary | ICD-10-CM | POA: Diagnosis present

## 2016-08-02 DIAGNOSIS — R52 Pain, unspecified: Secondary | ICD-10-CM

## 2016-08-02 DIAGNOSIS — N182 Chronic kidney disease, stage 2 (mild): Secondary | ICD-10-CM | POA: Diagnosis present

## 2016-08-02 DIAGNOSIS — Z79899 Other long term (current) drug therapy: Secondary | ICD-10-CM

## 2016-08-02 DIAGNOSIS — I129 Hypertensive chronic kidney disease with stage 1 through stage 4 chronic kidney disease, or unspecified chronic kidney disease: Secondary | ICD-10-CM | POA: Diagnosis present

## 2016-08-02 DIAGNOSIS — W1830XA Fall on same level, unspecified, initial encounter: Secondary | ICD-10-CM | POA: Diagnosis present

## 2016-08-02 DIAGNOSIS — Z7982 Long term (current) use of aspirin: Secondary | ICD-10-CM

## 2016-08-02 DIAGNOSIS — E785 Hyperlipidemia, unspecified: Secondary | ICD-10-CM | POA: Diagnosis present

## 2016-08-02 DIAGNOSIS — F17210 Nicotine dependence, cigarettes, uncomplicated: Secondary | ICD-10-CM | POA: Diagnosis present

## 2016-08-02 DIAGNOSIS — N170 Acute kidney failure with tubular necrosis: Secondary | ICD-10-CM | POA: Diagnosis present

## 2016-08-02 DIAGNOSIS — S42292A Other displaced fracture of upper end of left humerus, initial encounter for closed fracture: Secondary | ICD-10-CM | POA: Diagnosis present

## 2016-08-02 LAB — BASIC METABOLIC PANEL
Anion gap: 14 (ref 5–15)
BUN: 23 mg/dL — ABNORMAL HIGH (ref 6–20)
CO2: 18 mmol/L — ABNORMAL LOW (ref 22–32)
Calcium: 9.1 mg/dL (ref 8.9–10.3)
Chloride: 101 mmol/L (ref 101–111)
Creatinine, Ser: 1.66 mg/dL — ABNORMAL HIGH (ref 0.61–1.24)
GFR calc Af Amer: 51 mL/min — ABNORMAL LOW (ref >60)
GFR calc non Af Amer: 44 mL/min — ABNORMAL LOW (ref >60)
Glucose, Bld: 153 mg/dL — ABNORMAL HIGH (ref 65–99)
Potassium: 3.8 mmol/L (ref 3.5–5.1)
Sodium: 133 mmol/L — ABNORMAL LOW (ref 135–145)

## 2016-08-02 LAB — CBC WITH DIFFERENTIAL/PLATELET
Basophils Absolute: 0.1 10*3/uL (ref 0–0.1)
Basophils Relative: 3 %
Eosinophils Absolute: 0 10*3/uL (ref 0–0.7)
Eosinophils Relative: 0 %
HCT: 36.8 % — ABNORMAL LOW (ref 40.0–52.0)
Hemoglobin: 12 g/dL — ABNORMAL LOW (ref 13.0–18.0)
Lymphocytes Relative: 28 %
Lymphs Abs: 1.1 10*3/uL (ref 1.0–3.6)
MCH: 28.4 pg (ref 26.0–34.0)
MCHC: 32.5 g/dL (ref 32.0–36.0)
MCV: 87.3 fL (ref 80.0–100.0)
Monocytes Absolute: 0.3 10*3/uL (ref 0.2–1.0)
Monocytes Relative: 7 %
Neutro Abs: 2.4 10*3/uL (ref 1.4–6.5)
Neutrophils Relative %: 62 %
Platelets: 174 10*3/uL (ref 150–440)
RBC: 4.22 MIL/uL — ABNORMAL LOW (ref 4.40–5.90)
RDW: 14.3 % (ref 11.5–14.5)
WBC: 3.9 10*3/uL (ref 3.8–10.6)

## 2016-08-02 LAB — URINALYSIS COMPLETE WITH MICROSCOPIC (ARMC ONLY)
Bacteria, UA: NONE SEEN
Bilirubin Urine: NEGATIVE
Glucose, UA: NEGATIVE mg/dL
Leukocytes, UA: NEGATIVE
Nitrite: NEGATIVE
Protein, ur: 100 mg/dL — AB
Specific Gravity, Urine: 1.021 (ref 1.005–1.030)
Squamous Epithelial / LPF: NONE SEEN
pH: 5 (ref 5.0–8.0)

## 2016-08-02 LAB — HEPATIC FUNCTION PANEL
ALT: 12 U/L — ABNORMAL LOW (ref 17–63)
AST: 30 U/L (ref 15–41)
Albumin: 4 g/dL (ref 3.5–5.0)
Alkaline Phosphatase: 134 U/L — ABNORMAL HIGH (ref 38–126)
Bilirubin, Direct: <0.1 mg/dL — ABNORMAL LOW (ref 0.1–0.5)
Total Bilirubin: <0.1 mg/dL — ABNORMAL LOW (ref 0.3–1.2)
Total Protein: 7.9 g/dL (ref 6.5–8.1)

## 2016-08-02 LAB — LIPASE, BLOOD: Lipase: 31 U/L (ref 11–51)

## 2016-08-02 LAB — URINE DRUG SCREEN, QUALITATIVE (ARMC ONLY)
Amphetamines, Ur Screen: NOT DETECTED
Barbiturates, Ur Screen: NOT DETECTED
Benzodiazepine, Ur Scrn: NOT DETECTED
Cannabinoid 50 Ng, Ur ~~LOC~~: NOT DETECTED
Cocaine Metabolite,Ur ~~LOC~~: NOT DETECTED
MDMA (Ecstasy)Ur Screen: NOT DETECTED
Methadone Scn, Ur: NOT DETECTED
Opiate, Ur Screen: NOT DETECTED
Phencyclidine (PCP) Ur S: NOT DETECTED
Tricyclic, Ur Screen: NOT DETECTED

## 2016-08-02 LAB — SALICYLATE LEVEL: Salicylate Lvl: <7 mg/dL (ref 2.8–30.0)

## 2016-08-02 LAB — ETHANOL: Alcohol, Ethyl (B): <5 mg/dL (ref <5)

## 2016-08-02 LAB — GLUCOSE, CAPILLARY: Glucose-Capillary: 141 mg/dL — ABNORMAL HIGH (ref 65–99)

## 2016-08-02 LAB — PROTIME-INR
INR: 1.07
Prothrombin Time: 13.9 seconds (ref 11.4–15.2)

## 2016-08-02 LAB — ACETAMINOPHEN LEVEL: Acetaminophen (Tylenol), Serum: <10 ug/mL — ABNORMAL LOW (ref 10–30)

## 2016-08-02 LAB — AMMONIA: Ammonia: 23 umol/L (ref 9–35)

## 2016-08-02 MED ORDER — SENNOSIDES-DOCUSATE SODIUM 8.6-50 MG PO TABS: 1.0000 | ORAL_TABLET | Freq: Every evening | ORAL | Status: DC | PRN

## 2016-08-02 MED ORDER — HYDROCODONE-ACETAMINOPHEN 5-325 MG PO TABS
1.0000 | ORAL_TABLET | ORAL | Status: DC | PRN
Start: 2016-08-02 — End: 2016-08-04
  Administered 2016-08-04: 01:00:00 1 via ORAL
  Filled 2016-08-02: qty 1

## 2016-08-02 MED ORDER — FOLIC ACID 1 MG PO TABS
1.0000 mg | ORAL_TABLET | Freq: Every day | ORAL | Status: DC
  Administered 2016-08-03 – 2016-08-04 (×2): 1 mg via ORAL
  Filled 2016-08-02 (×2): qty 1

## 2016-08-02 MED ORDER — ASPIRIN EC 325 MG PO TBEC
325.0000 mg | DELAYED_RELEASE_TABLET | Freq: Every day | ORAL | Status: DC
  Administered 2016-08-03 – 2016-08-04 (×2): 325 mg via ORAL
  Filled 2016-08-02 (×2): qty 1

## 2016-08-02 MED ORDER — LORAZEPAM 2 MG/ML IJ SOLN
1.0000 mg | Freq: Once | INTRAMUSCULAR | Status: AC
  Administered 2016-08-02: 1 mg via INTRAVENOUS

## 2016-08-02 MED ORDER — SODIUM CHLORIDE 0.9 % IV BOLUS (SEPSIS)
500.0000 mL | Freq: Once | INTRAVENOUS | Status: AC
  Administered 2016-08-02: 500 mL via INTRAVENOUS

## 2016-08-02 MED ORDER — ONDANSETRON HCL 4 MG PO TABS: 4.0000 mg | ORAL_TABLET | Freq: Four times a day (QID) | ORAL | Status: DC | PRN

## 2016-08-02 MED ORDER — SODIUM CHLORIDE 0.9 % IV SOLN
1.0000 mg | Freq: Once | INTRAVENOUS | Status: AC
  Administered 2016-08-02: 1 mg via INTRAVENOUS
  Filled 2016-08-02: qty 0.2

## 2016-08-02 MED ORDER — CARVEDILOL 25 MG PO TABS
25.0000 mg | ORAL_TABLET | Freq: Two times a day (BID) | ORAL | Status: DC
  Administered 2016-08-03 – 2016-08-04 (×3): 25 mg via ORAL
  Filled 2016-08-02 (×3): qty 1

## 2016-08-02 MED ORDER — MAGNESIUM CITRATE PO SOLN: 1.0000 | Freq: Once | ORAL | Status: DC | PRN

## 2016-08-02 MED ORDER — LORAZEPAM 2 MG/ML IJ SOLN
INTRAMUSCULAR | Status: AC
  Administered 2016-08-02: 1 mg via INTRAVENOUS
  Filled 2016-08-02: qty 1

## 2016-08-02 MED ORDER — VITAMIN B-1 100 MG PO TABS
100.0000 mg | ORAL_TABLET | Freq: Every day | ORAL | Status: DC
  Administered 2016-08-03 – 2016-08-04 (×2): 100 mg via ORAL
  Filled 2016-08-02 (×2): qty 1

## 2016-08-02 MED ORDER — THIAMINE HCL 100 MG/ML IJ SOLN
100.0000 mg | Freq: Every day | INTRAMUSCULAR | Status: DC
  Administered 2016-08-02: 100 mg via INTRAVENOUS
  Filled 2016-08-02: qty 2

## 2016-08-02 MED ORDER — ADULT MULTIVITAMIN W/MINERALS CH
1.0000 | ORAL_TABLET | Freq: Every day | ORAL | Status: DC
  Administered 2016-08-03 – 2016-08-04 (×2): 1 via ORAL
  Filled 2016-08-02 (×2): qty 1

## 2016-08-02 MED ORDER — HEPARIN SODIUM (PORCINE) 5000 UNIT/ML IJ SOLN
5000.0000 [IU] | Freq: Three times a day (TID) | INTRAMUSCULAR | Status: DC
  Administered 2016-08-03 (×2): 5000 [IU] via SUBCUTANEOUS
  Filled 2016-08-02 (×2): qty 1

## 2016-08-02 MED ORDER — ACETAMINOPHEN 650 MG RE SUPP: 650.0000 mg | Freq: Four times a day (QID) | RECTAL | Status: DC | PRN

## 2016-08-02 MED ORDER — ACETAMINOPHEN 325 MG PO TABS: 650.0000 mg | ORAL_TABLET | Freq: Four times a day (QID) | ORAL | Status: DC | PRN

## 2016-08-02 MED ORDER — ONDANSETRON HCL 4 MG/2ML IJ SOLN: 4.0000 mg | Freq: Four times a day (QID) | INTRAMUSCULAR | Status: DC | PRN

## 2016-08-02 MED ORDER — BISACODYL 5 MG PO TBEC: 5.0000 mg | DELAYED_RELEASE_TABLET | Freq: Every day | ORAL | Status: DC | PRN

## 2016-08-02 MED ORDER — TAMSULOSIN HCL 0.4 MG PO CAPS
0.4000 mg | ORAL_CAPSULE | Freq: Every day | ORAL | Status: DC
  Administered 2016-08-03 – 2016-08-04 (×2): 0.4 mg via ORAL
  Filled 2016-08-02 (×2): qty 1

## 2016-08-02 MED ORDER — LORAZEPAM 2 MG/ML IJ SOLN: 1.0000 mg | INTRAMUSCULAR | Status: DC | PRN

## 2016-08-02 MED ORDER — SODIUM CHLORIDE 0.9% FLUSH
3.0000 mL | Freq: Two times a day (BID) | INTRAVENOUS | Status: DC
  Administered 2016-08-03 – 2016-08-04 (×4): 3 mL via INTRAVENOUS

## 2016-08-02 MED ORDER — ATORVASTATIN CALCIUM 20 MG PO TABS
20.0000 mg | ORAL_TABLET | Freq: Every day | ORAL | Status: DC
  Administered 2016-08-03: 17:00:00 20 mg via ORAL
  Filled 2016-08-02: qty 1

## 2016-08-02 MED ORDER — SODIUM CHLORIDE 0.9 % IV SOLN
INTRAVENOUS | Status: DC
Start: 2016-08-02 — End: 2016-08-03
  Administered 2016-08-03: via INTRAVENOUS

## 2016-08-02 MED ORDER — AMLODIPINE BESYLATE 5 MG PO TABS
5.0000 mg | ORAL_TABLET | Freq: Every day | ORAL | Status: DC
  Administered 2016-08-03 – 2016-08-04 (×2): 5 mg via ORAL
  Filled 2016-08-02 (×2): qty 1

## 2016-08-02 MED ORDER — LISINOPRIL 20 MG PO TABS
20.0000 mg | ORAL_TABLET | Freq: Every day | ORAL | Status: DC
  Administered 2016-08-03 – 2016-08-04 (×2): 20 mg via ORAL
  Filled 2016-08-02 (×2): qty 1

## 2016-08-02 NOTE — ED Provider Notes (Addendum)
[facility] Emergency Department Provider Note  ____________________________________________   I have reviewed the triage vital signs and the nursing notes.   HISTORY  Chief Complaint Seizures    HPI Brad Graham is a 55 y.o. male who drinks alcohol every day. Has a history of CVA with question of a left-sided weakness according to EMS. hypertension. Patient states that he can't remove the last time he had the alcohol. According to work mates to come with him and EMS, patient had a grand mal witnessed seizure. He did bump his head. He has never had a seizure that he recollects. His history is somewhat foggy however as he still is somewhat confused. He denies any injury. Patient was seen for similar event last month, it was thought at that time not likely to be a seizure. He did bite his tongue this time. Patient is having difficulty moving his left arm. He has no idea how long that is been going on. This is only noticed on exam. He himself is not complaining of it.   Past Medical History:  Diagnosis Date  . Alcohol abuse   . Arthritis   . H/O: CVA (cerebrovascular accident)   . Hyperlipidemia   . Hypertension     Patient Active Problem List   Diagnosis Date Noted  . Syncope 07/02/2016  . Essential hypertension 05/10/2015  . Hyperglycemia 05/10/2015  . Hyperlipidemia 05/10/2015  . Proteinuria 05/10/2015    Past Surgical History:  Procedure Laterality Date  . none      Prior to Admission medications   Medication Sig Start Date End Date Taking? Authorizing Provider  amLODipine (NORVASC) 5 MG tablet Take 1 tablet (5 mg total) by mouth daily. 11/17/15   Gabriel Cirri, NP  aspirin 325 MG EC tablet Take 1 tablet (325 mg total) by mouth daily. 07/03/16   Enid Baas, MD  atorvastatin (LIPITOR) 20 MG tablet Take 1 tablet (20 mg total) by mouth daily at 6 PM. Patient taking differently: Take 20 mg by mouth at bedtime.  04/19/16   Gabriel Cirri, NP   carvedilol (COREG) 25 MG tablet Take 1 tablet (25 mg total) by mouth 2 (two) times daily with a meal. 04/19/16   Gabriel Cirri, NP  lisinopril (PRINIVIL,ZESTRIL) 20 MG tablet Take 1 tablet (20 mg total) by mouth daily. 07/03/16   Enid Baas, MD  tamsulosin (FLOMAX) 0.4 MG CAPS capsule Take 1 capsule (0.4 mg total) by mouth daily. 03/20/16   Gabriel Cirri, NP    Allergies Patient has no known allergies.  Family History  Problem Relation Age of Onset  . Diabetes Mother   . Stroke Mother   . Heart disease Maternal Grandmother   . Heart disease Maternal Grandfather     Social History Social History  Substance Use Topics  . Smoking status: Current Some Day Smoker    Types: Cigarettes  . Smokeless tobacco: Never Used  . Alcohol use Yes     Comment: "1/2 pint liquor/day"    Review of Systems Limited history poor historian cLevel 5 chart caveat; no further history available due to patient status. Constitutional: No fever/chills Eyes: No visual changes. ENT: No sore throat. No stiff neck no neck pain Cardiovascular: Denies chest pain. Respiratory: Denies shortness of breath. Gastrointestinal:   no vomiting.  No diarrhea.  No constipation. Genitourinary: Negative for dysuria. Musculoskeletal: Negative lower extremity swelling Skin: Negative for rash. Neurological: Negative for severe headaches, focal weakness or numbness. 10-point ROS otherwise negative.  ____________________________________________  PHYSICAL EXAM:  VITAL SIGNS: ED Triage Vitals  Enc Vitals Group     BP 08/02/16 1845 129/77     Pulse Rate 08/02/16 1845 88     Resp 08/02/16 1845 (!) 24     Temp 08/02/16 1845 98.3 F (36.8 C)     Temp Source 08/02/16 1845 Oral     SpO2 08/02/16 1845 93 %     Weight 08/02/16 1846 221 lb 9 oz (100.5 kg)     Height 08/02/16 1846 5\' 11"  (1.803 m)     Head Circumference --      Peak Flow --      Pain Score --      Pain Loc --      Pain Edu? --      Excl. in GC? --      Constitutional: Alert and orientedAnd somewhat confused Eyes: Conjunctivae are normal. PERRL. EOMI. Head: Abrasion to occiput noted, nothing I can sew. Nose: No congestion/rhinnorhea. Mouth/Throat: Mucous membranes are moist.  Oropharynx non-erythematous. Tongue bite noted Neck: No stridor.   Nontender with no meningismus Cardiovascular: Normal rate, regular rhythm. Grossly normal heart sounds.  Good peripheral circulation. Respiratory: Normal respiratory effort.  No retractions. Lungs CTAB. Abdominal: Soft and nontender. No distention. No guarding no rebound Back:  There is no focal tenderness or step off.  there is no midline tenderness there are no lesions noted. there is no CVA tenderness Musculoskeletal: No lower extremity tenderness, no upper extremity tenderness. No joint effusions, no DVT signs strong distal pulses no edema Neurologic: Speech is somewhat slurred although is difficult to tell that is because of his baseline. Patient has difficulty moving his left arm. He has intact sensation. He cannot hold it up against gravity for any length of time. He does not know if this is new or old. Right upper extremity has normal strength, bilateral lower extremity is or normal in strength and sensation, no obvious facial droop noted but again very limited exam  Skin:  Skin is warm, dry and intact. No rash noted. Psychiatric: Mood and affect are normal. Speech and behavior are normal.  ____________________________________________   LABS (all labs ordered are listed, but only abnormal results are displayed)  Labs Reviewed  CBC WITH DIFFERENTIAL/PLATELET - Abnormal; Notable for the following:       Result Value   RBC 4.22 (*)    Hemoglobin 12.0 (*)    HCT 36.8 (*)    All other components within normal limits  HEPATIC FUNCTION PANEL - Abnormal; Notable for the following:    ALT 12 (*)    Alkaline Phosphatase 134 (*)    Total Bilirubin <0.1 (*)    Bilirubin, Direct <0.1 (*)    All  other components within normal limits  GLUCOSE, CAPILLARY - Abnormal; Notable for the following:    Glucose-Capillary 141 (*)    All other components within normal limits  LIPASE, BLOOD  PROTIME-INR  AMMONIA  SALICYLATE LEVEL  ACETAMINOPHEN LEVEL  ETHANOL  URINALYSIS COMPLETEWITH MICROSCOPIC ([facility] ONLY)  URINE DRUG SCREEN, QUALITATIVE ([facility] ONLY)  BASIC METABOLIC PANEL   ____________________________________________  EKG  I personally interpreted any EKGs ordered by me or triage Sinus rhythm rate 90 bpm acute ST elevation or acute ST depression normal axis, possible old anterior infarct. ____________________________________________  RADIOLOGY  I reviewed any imaging ordered by me or triage that were performed during my shift and, if possible, patient and/or family made aware of any abnormal findings. ____________________________________________   PROCEDURES  Procedure(s) performed: None  Procedures  Critical Care performed: None  ____________________________________________   INITIAL IMPRESSION / ASSESSMENT AND PLAN / ED COURSE  Pertinent labs & imaging results that were available during my care of the patient were reviewed by me and considered in my medical decision making (see chart for details).  Patient here with witnessed seizure and tongue bite. Possibly alcohol related. Sodium is pending. History of hyponatremia in the past. He does have some focal neurologic deficit which is not known to be old. I cannot however tell when it first came on and he cannot tell me either. He is not a candidate for TPA given his seizure and unknown time of onset. No obvious trauma to the extremity. She does have a history of stuttering speech at baseline. This appears to be unchanged. He is waking up well the emergency department. We have given him thiamine and folate and Ativan as a precaution. CT is reassuring. I discussed with the hospitalist and they will admit the patient for  further evaluation of recurrent seizures of unknown etiology as well as left upper extremity weakness. todds paralysis is a consideration.   Clinical Course    ____________________________________________   FINAL CLINICAL IMPRESSION(S) / ED DIAGNOSES  Final diagnoses:  None      This chart was dictated using voice recognition software.  Despite best efforts to proofread,  errors can occur which can change meaning.      Jeanmarie PlantJames A Sandie Swayze, MD 08/02/16 2014    Jeanmarie PlantJames A Amritpal Shropshire, MD 08/02/16 2020    Jeanmarie PlantJames A Danylah Holden, MD 08/02/16 2036

## 2016-08-02 NOTE — ED Triage Notes (Signed)
Pt presents to ED via [facility] from [employer] where he works c/o seizure-like activity lasting approx. 3 mins, witnessed by bystanders. EMS reports pt was post-ictal upon their initial assessment. Pt is A&Ox4 upon arrival to ED. Denies hx seizure. Small lac noted to post head, bleeding controlled. Pt states he may have bitten his tongue. No incontinence noted. Hx previous CVA with residual L-sided weakness.

## 2016-08-02 NOTE — ED Notes (Signed)
Pt assisted to standing at bedside to collect urine sample.

## 2016-08-02 NOTE — ED Notes (Signed)
Patient requested to use a phone to call his family to let them know he is being admitted to the hospital, and that they have his permission to go check on things at his house. With assistance entering the phone number, he used this tech's phone to call.

## 2016-08-02 NOTE — H&P (Signed)
[facility] - [place] [facility] Admission History and Physical [employer], D.O.  ---------------------------------------------------------------------------------------------------------------------   PATIENT NAME: Brad Graham MR#: [id number] DATE OF BIRTH: 1961/02/21 DATE OF ADMISSION: 08/02/2016 PRIMARY CARE PHYSICIAN: No PCP Per Patient  REQUESTING/REFERRING PHYSICIAN: ED Dr. Alphonzo Lemmings  CHIEF COMPLAINT: Chief Complaint  Patient presents with  . Seizures    HISTORY OF PRESENT ILLNESS: Brad Graham is a 55 y.o. male with a known history of EtOH abuse, OA, CVA, HTN, HLD, CKD presents to the emergency department complaining of seizure.  Patient was in a usual state of health until This afternoon when patient had a witnessed seizure wall working. Apparently coworkers reported generalized tonic-clonic seizure activity with loss of consciousness, head trauma.  Patient was reportedly post ictal for some time after the event and has no recollection of the events. At this point he has no complaints. Patient states that this never happened to him before. He states that he has been trying to decrease the amount of alcohol that he drinks. Usually drinks one half a pint of liquor daily with his last drink being yesterday. He denies any other symptoms..  Otherwise there has been no change in status. Patient has been taking medication as prescribed and there has been no recent change in medication or diet.  There has been no recent illness, travel or sick contacts.    Patient denies fevers/chills, weakness, dizziness, chest pain, shortness of breath, N/V/C/D, abdominal pain, dysuria/frequency, changes in mental status.   EMS/ED COURSE:   Patient received Ativan, thiamine and folate.Marland Kitchen  PAST MEDICAL HISTORY: Past Medical History:  Diagnosis Date  . Alcohol abuse   . Arthritis   . H/O: CVA (cerebrovascular accident)   . Hyperlipidemia   . Hypertension       PAST SURGICAL  HISTORY: Past Surgical History:  Procedure Laterality Date  . none        SOCIAL HISTORY: Social History  Substance Use Topics  . Smoking status: Current Some Day Smoker    Types: Cigarettes  . Smokeless tobacco: Never Used  . Alcohol use Yes     Comment: "1/2 pint liquor/day"      FAMILY HISTORY: Family History  Problem Relation Age of Onset  . Diabetes Mother   . Stroke Mother   . Heart disease Maternal Grandmother   . Heart disease Maternal Grandfather      MEDICATIONS AT HOME: Prior to Admission medications   Medication Sig Start Date End Date Taking? Authorizing Provider  amLODipine (NORVASC) 5 MG tablet Take 1 tablet (5 mg total) by mouth daily. 11/17/15  Yes Gabriel Cirri, NP  aspirin 325 MG EC tablet Take 1 tablet (325 mg total) by mouth daily. 07/03/16  Yes Enid Baas, MD  atorvastatin (LIPITOR) 20 MG tablet Take 1 tablet (20 mg total) by mouth daily at 6 PM. Patient taking differently: Take 20 mg by mouth daily.  04/19/16  Yes Gabriel Cirri, NP  carvedilol (COREG) 25 MG tablet Take 1 tablet (25 mg total) by mouth 2 (two) times daily with a meal. 04/19/16  Yes Gabriel Cirri, NP  lisinopril (PRINIVIL,ZESTRIL) 20 MG tablet Take 1 tablet (20 mg total) by mouth daily. 07/03/16  Yes Enid Baas, MD  tamsulosin (FLOMAX) 0.4 MG CAPS capsule Take 1 capsule (0.4 mg total) by mouth daily. 03/20/16  Yes Gabriel Cirri, NP      DRUG ALLERGIES: No Known Allergies   REVIEW OF SYSTEMS: CONSTITUTIONAL: No fatigue, weakness, fever, chills, weight gain/loss, headache EYES: No blurry  or double vision. ENT: No tinnitus, postnasal drip, redness or soreness of the oropharynx. RESPIRATORY: No dyspnea, cough, wheeze, hemoptysis. CARDIOVASCULAR: No chest pain, orthopnea, palpitations, syncope. GASTROINTESTINAL: No nausea, vomiting, constipation, diarrhea, abdominal pain. No hematemesis, melena or hematochezia. GENITOURINARY: No dysuria, frequency, hematuria. ENDOCRINE: No  polyuria or nocturia. No heat or cold intolerance. HEMATOLOGY: No anemia, bruising, bleeding. INTEGUMENTARY: No rashes, ulcers, lesions. MUSCULOSKELETAL: No pain, arthritis, swelling, gout. NEUROLOGIC: No numbness, tingling, weakness or ataxia.Positive seizure-type activity. PSYCHIATRIC: No anxiety, depression, insomnia.  PHYSICAL EXAMINATION: VITAL SIGNS: Blood pressure 139/90, pulse 85, temperature 97.8 F (36.6 C), temperature source Oral, resp. rate 16, height 5\' 11"  (1.803 m), weight 100.5 kg (221 lb 9 oz), SpO2 99 %.  GENERAL: 55 y.o.-year-old black male patient, well-developed, well-nourished lying in the bed in no acute distress.  Pleasant and cooperative.   HEENT: Head with very small laceration to the left posterior scalp. Pupils equal, round, reactive to light and accommodation. No scleral icterus. Extraocular muscles intact. Oropharynx is clear. Mucus membranes moist. There is a laceration to the right tongue consistent with tongue bite. NECK: Supple, full range of motion. No JVD, no bruit heard. No cervical lymphadenopathy. CHEST: Normal breath sounds bilaterally. No wheezing, rales, rhonchi or crackles. No use of accessory muscles of respiration.  No reproducible chest wall tenderness.  CARDIOVASCULAR: S1, S2 normal. No murmurs, rubs, or gallops appreciated. Cap refill <2 seconds. ABDOMEN: Soft, nontender, nondistended. No rebound, guarding, rigidity. Normoactive bowel sounds present in all four quadrants. No organomegaly or mass. EXTREMITIES: Full range of motion. No pedal edema, cyanosis, or clubbing. NEUROLOGIC: Cranial nerves II through XII are grossly intact with no focal sensorimotor deficit. Muscle strength 5/5 in all extremities. Sensation intact.  PSYCHIATRIC: The patient is alert and oriented x 3. Normal affect, mood, thought content. SKIN: Warm, dry, and intact without obvious rash, lesion, or ulcer.  LABORATORY PANEL:  CBC  Recent Labs Lab 08/02/16 1900  WBC  3.9  HGB 12.0*  HCT 36.8*  PLT 174   ----------------------------------------------------------------------------------------------------------------- Chemistries  Recent Labs Lab 08/02/16 1900  NA 133*  K 3.8  CL 101  CO2 18*  GLUCOSE 153*  BUN 23*  CREATININE 1.66*  CALCIUM 9.1  AST 30  ALT 12*  ALKPHOS 134*  BILITOT <0.1*   ------------------------------------------------------------------------------------------------------------------ Cardiac Enzymes No results for input(s): TROPONINI in the last 168 hours. ------------------------------------------------------------------------------------------------------------------  RADIOLOGY: Ct Head Wo Contrast  Result Date: 08/02/2016 CLINICAL DATA:  Altered mental status with seizure like activity. Small laceration to the back of head. EXAM: CT HEAD WITHOUT CONTRAST CT CERVICAL SPINE WITHOUT CONTRAST TECHNIQUE: Multidetector CT imaging of the head and cervical spine was performed following the standard protocol without intravenous contrast. Multiplanar CT image reconstructions of the cervical spine were also generated. COMPARISON:  07/02/2016 FINDINGS: CT HEAD FINDINGS Brain: No acute territorial infarction, intracranial hemorrhage, extra-axial fluid collection or focal mass lesion is visualized. Again visualized is an old right basal ganglia infarct and right old lacunar infarct in the periventricular white matter. Mild ex vacuo dilatation of the right ventricle. Mild periventricular white matter hypodensities consistent with small vessel disease. Vascular: No hyperdense vessels.  Carotid artery calcifications. Skull: Mastoid air cells clear.  No fracture. Sinuses/Orbits: Mild mucosal thickening in the sphenoid and ethmoid sinuses. No acute orbital abnormality. Other: None CT CERVICAL SPINE FINDINGS Alignment: There is straightening of the cervical spine. Facet alignment is maintained. Skull base and vertebrae: Craniovertebral junction  is intact. Partial fusion anterior aspect right occipital condyle to the body  of C1. Vertebral body heights are maintained. There is no fracture. Soft tissues and spinal canal: Prevertebral soft tissue thickness appears normal. No paravertebral or paraspinal soft tissue abnormality. Disc levels: Moderate-to-marked disc changes at C5-C6, C6-C7. Posterior disc osteophyte complexes at C5-C6 with mild canal stenosis. Mild facet arthropathy. No significant foraminal stenosis. Upper chest: Lung apices clear.  Thyroid gland normal. Other: None IMPRESSION: 1. No CT evidence for acute intracranial abnormality. Evidence of old right basal ganglial infarct. Mild periventricular white matter small vessel changes. 2. Straightening of the cervical spine. Multilevel degenerative disc changes. No acute fracture or malalignment. Electronically Signed   By: Jasmine PangKim  Fujinaga M.D.   On: 08/02/2016 19:57   Ct Cervical Spine Wo Contrast  Result Date: 08/02/2016 CLINICAL DATA:  Altered mental status with seizure like activity. Small laceration to the back of head. EXAM: CT HEAD WITHOUT CONTRAST CT CERVICAL SPINE WITHOUT CONTRAST TECHNIQUE: Multidetector CT imaging of the head and cervical spine was performed following the standard protocol without intravenous contrast. Multiplanar CT image reconstructions of the cervical spine were also generated. COMPARISON:  07/02/2016 FINDINGS: CT HEAD FINDINGS Brain: No acute territorial infarction, intracranial hemorrhage, extra-axial fluid collection or focal mass lesion is visualized. Again visualized is an old right basal ganglia infarct and right old lacunar infarct in the periventricular white matter. Mild ex vacuo dilatation of the right ventricle. Mild periventricular white matter hypodensities consistent with small vessel disease. Vascular: No hyperdense vessels.  Carotid artery calcifications. Skull: Mastoid air cells clear.  No fracture. Sinuses/Orbits: Mild mucosal thickening in the  sphenoid and ethmoid sinuses. No acute orbital abnormality. Other: None CT CERVICAL SPINE FINDINGS Alignment: There is straightening of the cervical spine. Facet alignment is maintained. Skull base and vertebrae: Craniovertebral junction is intact. Partial fusion anterior aspect right occipital condyle to the body of C1. Vertebral body heights are maintained. There is no fracture. Soft tissues and spinal canal: Prevertebral soft tissue thickness appears normal. No paravertebral or paraspinal soft tissue abnormality. Disc levels: Moderate-to-marked disc changes at C5-C6, C6-C7. Posterior disc osteophyte complexes at C5-C6 with mild canal stenosis. Mild facet arthropathy. No significant foraminal stenosis. Upper chest: Lung apices clear.  Thyroid gland normal. Other: None IMPRESSION: 1. No CT evidence for acute intracranial abnormality. Evidence of old right basal ganglial infarct. Mild periventricular white matter small vessel changes. 2. Straightening of the cervical spine. Multilevel degenerative disc changes. No acute fracture or malalignment. Electronically Signed   By: Jasmine PangKim  Fujinaga M.D.   On: 08/02/2016 19:57    EKG: Normal sinus rhythm at 90 bpm with normal axis and nonspecific ST-T wave changes.   IMPRESSION AND PLAN:  This is a 55 y.o. male with a history of EtOH abuse, OA, CVA, HTN, HLD, CKD  now being admitted with: 1. Seizure, likely secondary to alcohol withdrawal-we'll admit to inpatient for telemetry monitoring, CIWA protocol. We'll place on seizure and aspiration precautions. 2. Hyponatremia, chronic. At or better than baseline-we'll give gentle IV fluid hydration. 3. History of hypertension-continue Norvasc, Coreg, lisinopril 4. History of hyperlipidemia-continue Lipitor 5. History of CVA-continue aspirin 6. History of BPH-continue Flomax.   Diet/Nutrition: Heart healthy Fluids: IV normal saline DVT Px: Heparin, SCDs and early ambulation Code Status: Full  All the records are  reviewed and case discussed with ED provider. Management plans discussed with the patient and/or family who express understanding and agree with plan of care.   TOTAL TIME TAKING CARE OF THIS PATIENT: 60 minutes.   Davier Tramell D.O. on 08/02/2016 at  11:52 PM Between 7am to 6pm - Pager - [phone] After 6pm go to [email or web address] - [employer] [facility] Office [phone] CC: Primary care physician; No PCP Per Patient     Note: This dictation was prepared with Dragon dictation along with smaller phrase technology. Any transcriptional errors that result from this process are unintentional.

## 2016-08-03 LAB — COMPREHENSIVE METABOLIC PANEL
ALT: 13 U/L — ABNORMAL LOW (ref 17–63)
AST: 20 U/L (ref 15–41)
Albumin: 4 g/dL (ref 3.5–5.0)
Alkaline Phosphatase: 132 U/L — ABNORMAL HIGH (ref 38–126)
Anion gap: 12 (ref 5–15)
BUN: 18 mg/dL (ref 6–20)
CO2: 20 mmol/L — ABNORMAL LOW (ref 22–32)
Calcium: 8.9 mg/dL (ref 8.9–10.3)
Chloride: 104 mmol/L (ref 101–111)
Creatinine, Ser: 1.05 mg/dL (ref 0.61–1.24)
GFR calc Af Amer: >60 mL/min (ref >60)
GFR calc non Af Amer: >60 mL/min (ref >60)
Glucose, Bld: 121 mg/dL — ABNORMAL HIGH (ref 65–99)
Potassium: 3.6 mmol/L (ref 3.5–5.1)
Sodium: 136 mmol/L (ref 135–145)
Total Bilirubin: 1.2 mg/dL (ref 0.3–1.2)
Total Protein: 7.7 g/dL (ref 6.5–8.1)

## 2016-08-03 LAB — CBC
HCT: 35.2 % — ABNORMAL LOW (ref 40.0–52.0)
Hemoglobin: 11.5 g/dL — ABNORMAL LOW (ref 13.0–18.0)
MCH: 28.9 pg (ref 26.0–34.0)
MCHC: 32.8 g/dL (ref 32.0–36.0)
MCV: 88.2 fL (ref 80.0–100.0)
Platelets: 163 10*3/uL (ref 150–440)
RBC: 3.99 MIL/uL — ABNORMAL LOW (ref 4.40–5.90)
RDW: 14.4 % (ref 11.5–14.5)
WBC: 5.8 10*3/uL (ref 3.8–10.6)

## 2016-08-03 LAB — PHOSPHORUS: Phosphorus: 3.9 mg/dL (ref 2.5–4.6)

## 2016-08-03 LAB — MAGNESIUM: Magnesium: 1.7 mg/dL (ref 1.7–2.4)

## 2016-08-03 LAB — TSH: TSH: 3.308 u[IU]/mL (ref 0.350–4.500)

## 2016-08-03 MED ORDER — INFLUENZA VAC SPLIT QUAD 0.5 ML IM SUSY
0.5000 mL | PREFILLED_SYRINGE | INTRAMUSCULAR | Status: DC
  Filled 2016-08-03: qty 0.5

## 2016-08-03 MED ORDER — ENOXAPARIN SODIUM 40 MG/0.4ML ~~LOC~~ SOLN
40.0000 mg | SUBCUTANEOUS | Status: DC
  Administered 2016-08-03: 40 mg via SUBCUTANEOUS
  Filled 2016-08-03: qty 0.4

## 2016-08-03 MED ORDER — LORAZEPAM 2 MG/ML IJ SOLN: 1.0000 mg | Freq: Four times a day (QID) | INTRAMUSCULAR | Status: DC | PRN

## 2016-08-03 MED ORDER — LORAZEPAM 1 MG PO TABS: 1.0000 mg | ORAL_TABLET | Freq: Four times a day (QID) | ORAL | Status: DC | PRN

## 2016-08-03 NOTE — Care Management (Signed)
Admitted for seizure.  Patient has never had seizure.  Patient has been decreasing his daily alcohol consumption.  It is thought the seizure is from alcohol withdrawal. Independent in all adls, denies issues accessing medical care, obtaining medications or with transportation.  At present no cm discharge needs have been identified

## 2016-08-03 NOTE — Progress Notes (Signed)
[facility] [place] [facility]   PATIENT NAME: Brad SeatsDwayne Graham    MR#:  [id number]  DATE OF BIRTH:  06/14/1961  Admitted  for alcohol withdrawal seizures. Did not have any further seizure. Has heavy alcohol abuse.   CHIEF COMPLAINT:   Chief Complaint  Patient presents with  . Seizures    REVIEW OF SYSTEMS:   ROS CONSTITUTIONAL: No fever, fatigue or weakness.  EYES: No blurred or double vision.  EARS, NOSE, AND THROAT: No tinnitus or ear pain.  RESPIRATORY: No cough, shortness of breath, wheezing or hemoptysis.  CARDIOVASCULAR: No chest pain, orthopnea, edema.  GASTROINTESTINAL: No nausea, vomiting, diarrhea or abdominal pain.  GENITOURINARY: No dysuria, hematuria.  ENDOCRINE: No polyuria, nocturia,  HEMATOLOGY: No anemia, easy bruising or bleeding SKIN: No rash or lesion. MUSCULOSKELETAL: No joint pain or arthritis.   NEUROLOGIC: No tingling, numbness, weakness.  PSYCHIATRY: No anxiety or depression.   DRUG ALLERGIES:  No Known Allergies  VITALS:  Blood pressure (!) 161/96, pulse 96, temperature 98.3 F (36.8 C), temperature source Oral, resp. rate 18, height 5\' 11"  (1.803 m), weight 99.8 kg (220 lb 1.6 oz), SpO2 98 %.  PHYSICAL EXAMINATION:  GENERAL:  55 y.o.-year-old patient lying in the bed with no acute distress.  EYES: Pupils equal, round, reactive to light and accommodation. No scleral icterus. Extraocular muscles intact.  HEENT: Head atraumatic, normocephalic. Oropharynx and nasopharynx clear.  NECK:  Supple, no jugular venous distention. No thyroid enlargement, no tenderness.  LUNGS: Normal breath sounds bilaterally, no wheezing, rales,rhonchi or crepitation. No use of accessory muscles of respiration.  CARDIOVASCULAR: S1, S2 normal. No murmurs, rubs, or gallops.  ABDOMEN: Soft, nontender, nondistended. Bowel sounds present. No organomegaly or mass.  EXTREMITIES: No pedal edema, cyanosis, or clubbing.  NEUROLOGIC: Regular oriented,  left-sided weakness because of previous CVA.  SKIN: No obvious rash, lesion, or ulcer.    LABORATORY PANEL:   CBC  Recent Labs Lab 08/03/16 0404  WBC 5.8  HGB 11.5*  HCT 35.2*  PLT 163   ------------------------------------------------------------------------------------------------------------------  Chemistries   Recent Labs Lab 08/01/16 1900  08/03/16 0404  NA  --   < > 136  K  --   < > 3.6  CL  --   < > 104  CO2  --   < > 20*  GLUCOSE  --   < > 121*  BUN  --   < > 18  CREATININE  --   < > 1.05  CALCIUM  --   < > 8.9  MG 1.7  --   --   AST  --   < > 20  ALT  --   < > 13*  ALKPHOS  --   < > 132*  BILITOT  --   < > 1.2  < > = values in this interval not displayed. ------------------------------------------------------------------------------------------------------------------  Cardiac Enzymes No results for input(s): TROPONINI in the last 168 hours. ------------------------------------------------------------------------------------------------------------------  RADIOLOGY:  Ct Head Wo Contrast  Result Date: 08/02/2016 CLINICAL DATA:  Altered mental status with seizure like activity. Small laceration to the back of head. EXAM: CT HEAD WITHOUT CONTRAST CT CERVICAL SPINE WITHOUT CONTRAST TECHNIQUE: Multidetector CT imaging of the head and cervical spine was performed following the standard protocol without intravenous contrast. Multiplanar CT image reconstructions of the cervical spine were also generated. COMPARISON:  07/02/2016 FINDINGS: CT HEAD FINDINGS Brain: No acute territorial infarction, intracranial hemorrhage, extra-axial fluid collection or focal mass lesion is visualized. Again visualized  is an old right basal ganglia infarct and right old lacunar infarct in the periventricular white matter. Mild ex vacuo dilatation of the right ventricle. Mild periventricular white matter hypodensities consistent with small vessel disease. Vascular: No hyperdense vessels.   Carotid artery calcifications. Skull: Mastoid air cells clear.  No fracture. Sinuses/Orbits: Mild mucosal thickening in the sphenoid and ethmoid sinuses. No acute orbital abnormality. Other: None CT CERVICAL SPINE FINDINGS Alignment: There is straightening of the cervical spine. Facet alignment is maintained. Skull base and vertebrae: Craniovertebral junction is intact. Partial fusion anterior aspect right occipital condyle to the body of C1. Vertebral body heights are maintained. There is no fracture. Soft tissues and spinal canal: Prevertebral soft tissue thickness appears normal. No paravertebral or paraspinal soft tissue abnormality. Disc levels: Moderate-to-marked disc changes at C5-C6, C6-C7. Posterior disc osteophyte complexes at C5-C6 with mild canal stenosis. Mild facet arthropathy. No significant foraminal stenosis. Upper chest: Lung apices clear.  Thyroid gland normal. Other: None IMPRESSION: 1. No CT evidence for acute intracranial abnormality. Evidence of old right basal ganglial infarct. Mild periventricular white matter small vessel changes. 2. Straightening of the cervical spine. Multilevel degenerative disc changes. No acute fracture or malalignment. Electronically Signed   By: Jasmine PangKim  Fujinaga M.D.   On: 08/02/2016 19:57   Ct Cervical Spine Wo Contrast  Result Date: 08/02/2016 CLINICAL DATA:  Altered mental status with seizure like activity. Small laceration to the back of head. EXAM: CT HEAD WITHOUT CONTRAST CT CERVICAL SPINE WITHOUT CONTRAST TECHNIQUE: Multidetector CT imaging of the head and cervical spine was performed following the standard protocol without intravenous contrast. Multiplanar CT image reconstructions of the cervical spine were also generated. COMPARISON:  07/02/2016 FINDINGS: CT HEAD FINDINGS Brain: No acute territorial infarction, intracranial hemorrhage, extra-axial fluid collection or focal mass lesion is visualized. Again visualized is an old right basal ganglia infarct and  right old lacunar infarct in the periventricular white matter. Mild ex vacuo dilatation of the right ventricle. Mild periventricular white matter hypodensities consistent with small vessel disease. Vascular: No hyperdense vessels.  Carotid artery calcifications. Skull: Mastoid air cells clear.  No fracture. Sinuses/Orbits: Mild mucosal thickening in the sphenoid and ethmoid sinuses. No acute orbital abnormality. Other: None CT CERVICAL SPINE FINDINGS Alignment: There is straightening of the cervical spine. Facet alignment is maintained. Skull base and vertebrae: Craniovertebral junction is intact. Partial fusion anterior aspect right occipital condyle to the body of C1. Vertebral body heights are maintained. There is no fracture. Soft tissues and spinal canal: Prevertebral soft tissue thickness appears normal. No paravertebral or paraspinal soft tissue abnormality. Disc levels: Moderate-to-marked disc changes at C5-C6, C6-C7. Posterior disc osteophyte complexes at C5-C6 with mild canal stenosis. Mild facet arthropathy. No significant foraminal stenosis. Upper chest: Lung apices clear.  Thyroid gland normal. Other: None IMPRESSION: 1. No CT evidence for acute intracranial abnormality. Evidence of old right basal ganglial infarct. Mild periventricular white matter small vessel changes. 2. Straightening of the cervical spine. Multilevel degenerative disc changes. No acute fracture or malalignment. Electronically Signed   By: Jasmine PangKim  Fujinaga M.D.   On: 08/02/2016 19:57    EKG:   Orders placed or performed during the hospital encounter of 07/02/16  . ED EKG  . ED EKG  . EKG 12-Lead  . EKG 12-Lead    #1 alcohol withdrawal seizure:. on Ativan as needed, follow seizure precautions, no antiepileptics are ordered. Continue CIWA protocol #2 hyponatremia chronic #3 as his hypertension: Controlled with Norvasc, Coreg, lisinopril #4 hyperlipidemia continue Lipitor #  6 history of CVA with left-sided weakness: Continue  aspirin, statins #7 history of BPH continue Flomax Continue CIWA protocol, monitor for withdrawal signs   #8. mild acute renal failure with ATN due to dehydration: Continue gentle hydration, monitor kidney function   #9/ likely discharge in next 1-2 days if patient does not score high on CIWA protocol   d/w registered nurse  All the records are reviewed and case discussed with Care Management/Social Workerr. Management plans discussed with the patient, family and they are in agreement.  CODE STATUS: full  TOTAL TIME TAKING CARE OF THIS PATIENT: .   POSSIBLE D/C IN 1-2 DAYS, DEPENDING ON CLINICAL CONDITION.   Katha Hamming M.D on 08/03/2016 at 12:27 PM  Between 7am to 6pm - Pager - [phone]  After 6pm go to [email or web address] - password [email or web address] [facility]  [place]  [facility]  Office  [phone]  CC: Primary care physician; No PCP Per Patient   Note: This dictation was prepared with Dragon dictation along with smaller phrase technology. Any transcriptional errors that result from this process are unintentional.

## 2016-08-03 NOTE — Progress Notes (Signed)
While rounding, CH made initial visit to room 101. Pt presented in good spirits but with some slurred speech. Pt was hoping for a discharge today but worried about how he would get home. He has made some calls to his friend and brother who get off work around noon. Pt requested prayer which was provided. CH is available for follow up as needed.    08/03/16 1200  Clinical Encounter Type  Visited With Patient  Visit Type Initial;Spiritual support  Referral From Nurse  Spiritual Encounters  Spiritual Needs Prayer

## 2016-08-03 NOTE — Clinical Social Work Note (Signed)
Clinical Social Work Assessment  Patient Details  Name: Brad Graham MRN: [id number] Date of Birth: Oct 30, 1960  Date of referral:  08/03/16               Reason for consult:  Substance Use/ETOH Abuse                Permission sought to share information with:    Permission granted to share information::     Name::        Agency::     Relationship::     Contact Information:     Housing/Transportation Living arrangements for the past 2 months:  Apartment Source of Information:  Patient Patient Interpreter Needed:  None Criminal Activity/Legal Involvement Pertinent to Current Situation/Hospitalization:  No - Comment as needed Significant Relationships:  Other Family Members, Neighbor, Friend Lives with:  Self Do you feel safe going back to the place where you live?  Yes Need for family participation in patient care:  No (Coment)  Care giving concerns:  Patient lives alone in an apartment in [place].    Facilities manager / plan:  Holiday representative ([place]) received substance abuse consult. Per chart patient has a history of ETOH. Patient's tox screen was negative for everything. CSW met with patient alone at bedside to address consult. Patient was alert and oriented X4 and was sitting up in the bed. CSW introduced self and explained role of CSW department. Patient reported that he receives SSI and lives in [place]. Per patient he does not drive or have a car however he has lots of friends and family that provide transportation for him. 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 Patient reported that he has no children. Per patient he does drink alcohol but he wants to quit. Patient reported that 8 years ago he went through a 21 day residential rehab program in [place] and it worked well. CSW provided patient with outpatient substance abuse resources in [facility] including [place] and [employer]. Patient reported that he is familiar  with [facility]. Per patient this was his first seizure. CSW provided emotional support. Patient requested to be discharged home today. Charge RN aware of above. Please reconsult if future social work needs arise. CSW signing off.    Employment status:  Disabled (Comment on whether or not currently receiving Disability), Part-Time Insurance information:  Medicaid In [place] PT Recommendations:  Not assessed at this time Information / Referral to community resources:  Outpatient Substance Abuse Treatment Options  Patient/Family's Response to care:  Patient was accepting of substance abuse resources and requested to go home today.   Patient/Family's Understanding of and Emotional Response to Diagnosis, Current Treatment, and Prognosis:  Patient was very pleasant and thanked CSW for visit. Patient appeared motivated to follow up on resources.   Emotional Assessment Appearance:  Appears stated age Attitude/Demeanor/Rapport:    Affect (typically observed):  Accepting, Adaptable, Pleasant Orientation:  Oriented to Self, Oriented to Place, Oriented to  Time, Oriented to Situation Alcohol / Substance use:  Alcohol Use Psych involvement (Current and /or in the community):  No (Comment)  Discharge Needs  Concerns to be addressed:  No discharge needs identified Readmission within the last 30 days:  No Current discharge risk:  Substance Abuse Barriers to Discharge:  Continued Medical Work up   [employer], Veronia Beets, LCSW 08/03/2016, 9:23 AM

## 2016-08-04 ENCOUNTER — Inpatient Hospital Stay: Payer: Medicaid Other

## 2016-08-04 LAB — BASIC METABOLIC PANEL
Anion gap: 8 (ref 5–15)
BUN: 19 mg/dL (ref 6–20)
CO2: 24 mmol/L (ref 22–32)
Calcium: 9.2 mg/dL (ref 8.9–10.3)
Chloride: 102 mmol/L (ref 101–111)
Creatinine, Ser: 1.14 mg/dL (ref 0.61–1.24)
GFR calc Af Amer: >60 mL/min (ref >60)
GFR calc non Af Amer: >60 mL/min (ref >60)
Glucose, Bld: 136 mg/dL — ABNORMAL HIGH (ref 65–99)
Potassium: 4.1 mmol/L (ref 3.5–5.1)
Sodium: 134 mmol/L — ABNORMAL LOW (ref 135–145)

## 2016-08-04 NOTE — Consult Note (Signed)
ORTHOPAEDIC CONSULTATION  REQUESTING PHYSICIAN: Adrian SaranSital Mody, MD  Chief Complaint:   Left shoulder pain.  History of Present Illness: Brad Graham is a 55 y.o. male with a history of ethanol abuse, hypertension, hyperlipidemia, and arthritis who apparently suffered a grand mal seizure while at work 2 days ago. He was brought to the emergency room and subsequently admitted for medical management. The patient's mental status has returned to baseline and he began to complain of left shoulder pain, so x-rays of his left shoulder were obtained. These x-rays demonstrated a proximal humerus fracture, prompting orthopedic consultation. The patient does not recall any of the events surrounding his seizure episode, so he cannot describe how he fell or how he specifically injured his shoulder. He denies any numbness or paresthesias to his left hand or upper extremity.  Past Medical History:  Diagnosis Date  . Alcohol abuse   . Arthritis   . H/O: CVA (cerebrovascular accident)   . Hyperlipidemia   . Hypertension    Past Surgical History:  Procedure Laterality Date  . none     Social History   Social History  . Marital status: Single    Spouse name: N/A  . Number of children: N/A  . Years of education: N/A   Social History Main Topics  . Smoking status: Current Some Day Smoker    Packs/day: 0.25    Types: Cigarettes  . Smokeless tobacco: Never Used  . Alcohol use Yes     Comment: "1/2 pint liquor/day"  . Drug use: No  . Sexual activity: No   Other Topics Concern  . None   Social History Narrative  . None   Family History  Problem Relation Age of Onset  . Diabetes Mother   . Stroke Mother   . Heart disease Maternal Grandmother   . Heart disease Maternal Grandfather    No Known Allergies Prior to Admission medications   Medication Sig Start Date End Date Taking? Authorizing Provider  amLODipine (NORVASC) 5 MG  tablet Take 1 tablet (5 mg total) by mouth daily. 11/17/15  Yes Gabriel Cirriheryl Wicker, NP  aspirin 325 MG EC tablet Take 1 tablet (325 mg total) by mouth daily. 07/03/16  Yes Enid Baasadhika Kalisetti, MD  atorvastatin (LIPITOR) 20 MG tablet Take 1 tablet (20 mg total) by mouth daily at 6 PM. Patient taking differently: Take 20 mg by mouth daily.  04/19/16  Yes Gabriel Cirriheryl Wicker, NP  carvedilol (COREG) 25 MG tablet Take 1 tablet (25 mg total) by mouth 2 (two) times daily with a meal. 04/19/16  Yes Gabriel Cirriheryl Wicker, NP  lisinopril (PRINIVIL,ZESTRIL) 20 MG tablet Take 1 tablet (20 mg total) by mouth daily. 07/03/16  Yes Enid Baasadhika Kalisetti, MD  tamsulosin (FLOMAX) 0.4 MG CAPS capsule Take 1 capsule (0.4 mg total) by mouth daily. 03/20/16  Yes Gabriel Cirriheryl Wicker, NP   Ct Head Wo Contrast  Result Date: 08/02/2016 CLINICAL DATA:  Altered mental status with seizure like activity. Small laceration to the back of head. EXAM: CT HEAD WITHOUT CONTRAST CT CERVICAL SPINE WITHOUT CONTRAST TECHNIQUE: Multidetector CT imaging of the head and cervical spine was performed following the standard protocol without intravenous contrast. Multiplanar CT image reconstructions of the cervical spine were also generated. COMPARISON:  07/02/2016 FINDINGS: CT HEAD FINDINGS Brain: No acute territorial infarction, intracranial hemorrhage, extra-axial fluid collection or focal mass lesion is visualized. Again visualized is an old right basal ganglia infarct and right old lacunar infarct in the periventricular white matter. Mild ex vacuo dilatation of  the right ventricle. Mild periventricular white matter hypodensities consistent with small vessel disease. Vascular: No hyperdense vessels.  Carotid artery calcifications. Skull: Mastoid air cells clear.  No fracture. Sinuses/Orbits: Mild mucosal thickening in the sphenoid and ethmoid sinuses. No acute orbital abnormality. Other: None CT CERVICAL SPINE FINDINGS Alignment: There is straightening of the cervical spine. Facet  alignment is maintained. Skull base and vertebrae: Craniovertebral junction is intact. Partial fusion anterior aspect right occipital condyle to the body of C1. Vertebral body heights are maintained. There is no fracture. Soft tissues and spinal canal: Prevertebral soft tissue thickness appears normal. No paravertebral or paraspinal soft tissue abnormality. Disc levels: Moderate-to-marked disc changes at C5-C6, C6-C7. Posterior disc osteophyte complexes at C5-C6 with mild canal stenosis. Mild facet arthropathy. No significant foraminal stenosis. Upper chest: Lung apices clear.  Thyroid gland normal. Other: None IMPRESSION: 1. No CT evidence for acute intracranial abnormality. Evidence of old right basal ganglial infarct. Mild periventricular white matter small vessel changes. 2. Straightening of the cervical spine. Multilevel degenerative disc changes. No acute fracture or malalignment. Electronically Signed   By: Jasmine Pang M.D.   On: 08/02/2016 19:57   Ct Cervical Spine Wo Contrast  Result Date: 08/02/2016 CLINICAL DATA:  Altered mental status with seizure like activity. Small laceration to the back of head. EXAM: CT HEAD WITHOUT CONTRAST CT CERVICAL SPINE WITHOUT CONTRAST TECHNIQUE: Multidetector CT imaging of the head and cervical spine was performed following the standard protocol without intravenous contrast. Multiplanar CT image reconstructions of the cervical spine were also generated. COMPARISON:  07/02/2016 FINDINGS: CT HEAD FINDINGS Brain: No acute territorial infarction, intracranial hemorrhage, extra-axial fluid collection or focal mass lesion is visualized. Again visualized is an old right basal ganglia infarct and right old lacunar infarct in the periventricular white matter. Mild ex vacuo dilatation of the right ventricle. Mild periventricular white matter hypodensities consistent with small vessel disease. Vascular: No hyperdense vessels.  Carotid artery calcifications. Skull: Mastoid air  cells clear.  No fracture. Sinuses/Orbits: Mild mucosal thickening in the sphenoid and ethmoid sinuses. No acute orbital abnormality. Other: None CT CERVICAL SPINE FINDINGS Alignment: There is straightening of the cervical spine. Facet alignment is maintained. Skull base and vertebrae: Craniovertebral junction is intact. Partial fusion anterior aspect right occipital condyle to the body of C1. Vertebral body heights are maintained. There is no fracture. Soft tissues and spinal canal: Prevertebral soft tissue thickness appears normal. No paravertebral or paraspinal soft tissue abnormality. Disc levels: Moderate-to-marked disc changes at C5-C6, C6-C7. Posterior disc osteophyte complexes at C5-C6 with mild canal stenosis. Mild facet arthropathy. No significant foraminal stenosis. Upper chest: Lung apices clear.  Thyroid gland normal. Other: None IMPRESSION: 1. No CT evidence for acute intracranial abnormality. Evidence of old right basal ganglial infarct. Mild periventricular white matter small vessel changes. 2. Straightening of the cervical spine. Multilevel degenerative disc changes. No acute fracture or malalignment. Electronically Signed   By: Jasmine Pang M.D.   On: 08/02/2016 19:57   Dg Shoulder Left  Result Date: 08/04/2016 CLINICAL DATA:  Pain after fall EXAM: LEFT SHOULDER - 2+ VIEW COMPARISON:  None. FINDINGS: There is a comminuted fracture of the humeral head without dislocation. IMPRESSION: Comminuted fracture of the humeral head without dislocation. Electronically Signed   By: Gerome Sam III M.D   On: 08/04/2016 09:20    Positive ROS: All other systems have been reviewed and were otherwise negative with the exception of those mentioned in the HPI and as above.  Physical Exam: General:  Alert, no acute distress Psychiatric:  Patient is competent for consent with normal mood and affect   Cardiovascular:  No pedal edema Respiratory:  No wheezing, non-labored breathing GI:  Abdomen is  soft and non-tender Skin:  No lesions in the area of chief complaint Neurologic:  Sensation intact distally Lymphatic:  No axillary or cervical lymphadenopathy  Orthopedic Exam:  Orthopedic examination is limited to the left upper extremity and hand. There is some swelling around the shoulder area, as well as some swelling in the forearm and hand, but otherwise his skin inspection is unremarkable. He has mild tenderness to palpation of the anterior and lateral aspects of the shoulder region, but has increased pain with attempted active or passive range of motion of the shoulder. He is able to actively flex and extend his elbow without difficulty, pronate and supinate his forearm without discomfort, and flex and extend his wrist without pain. He also is able to make a full fist and flex and extend all digits without discomfort. He is neurovascularly intact to his left hand and upper extremity.  X-rays:  Recent x-rays of the left shoulder are available for review. These films demonstrate an impacted proximal humerus fracture with overall satisfactory maintenance of alignment. The humeral head is properly located within the glenoid.  Assessment: Impacted minimally displaced left proximal humerus fracture.  Plan: The treatment options were discussed with the patient. The patient injury can be managed nonoperatively. Therefore, I have recommended that he be placed into a shoulder immobilizer which he is to wear at all times for the next 4 weeks, removing it only for bathing purposes. He may be prescribed appropriate pain medication to take as necessary. Please arrange a follow-up appointment in my office in 2-3 weeks.  Thank you for asking me to participate in the care of this pleasant man. I will be happy to keep you abreast of his progress.   Maryagnes AmosJ. Jeffrey Poggi, MD  Beeper #:  [phone]  08/04/2016 12:17 PM

## 2016-08-04 NOTE — Discharge Summary (Signed)
[employer] - [place] at [facility]   PATIENT NAME: Brad SeatsDwayne Graham    MR#:  [id number]  DATE OF BIRTH:  06/13/1961  DATE OF ADMISSION:  08/02/2016 ADMITTING PHYSICIAN: Tonye RoyaltyAlexis Hugelmeyer, DO  DATE OF DISCHARGE: 08/04/2010  PRIMARY CARE PHYSICIAN: No PCP Per Patient    ADMISSION DIAGNOSIS:  Seizure (HCC) [R56.9] Left arm weakness [R29.898]  DISCHARGE DIAGNOSIS:  Active Problems:   Seizure (HCC)   SECONDARY DIAGNOSIS:   Past Medical History:  Diagnosis Date  . Alcohol abuse   . Arthritis   . H/O: CVA (cerebrovascular accident)   . Hyperlipidemia   . Hypertension     HOSPITAL COURSE:   55 y/o male with EtOh abuse presented with seizure.  1. EtOH related seizure: Patient  Neurology in 4-6 weeks.He is encouraged to stop drinking EtOh.  2. Hyponatremia: is improved with IV fluids  . Left shoulder pain with comminuted fracture without dislocation:Case discussed with Dr.Poggi. Patient will be discharged a shoulder orthopedics in 7-10 days.  4. EtOH abuse: Patient highly encouraged to stop drinking EtOH.  5. Tobacco dependence: Patient   6. History CVA: Patient is on aspirin    7. History of hyperlipidemia: Continue statin  8. History of hypertension: Patient is on Norva  9. BPH: Continue Flomax   DISCHARGE CONDITIONS AND DIET:   sTable for discharge on heart healthy diet  CONSULTS OBTAINED:    DRUG ALLERGIES:  No Known Allergies  DISCHARGE MEDICATIONS:   Current Discharge Medication List    CONTINUE these medications which have NOT CHANGED   Details  amLODipine (NORVASC) 5 MG tablet Take 1 tablet (5 mg total) by mouth daily. Qty: 90 tablet, Refills: 0    aspirin 325 MG EC tablet Take 1 tablet (325 mg total) by mouth daily. Qty: 30 tablet, Refills: 2    atorvastatin (LIPITOR) 20 MG tablet Take 1 tablet (20 mg total) by mouth daily at 6 PM. Qty: 30 tablet, Refills: 1    carvedilol (COREG) 25 MG tablet Take 1 tablet (25 mg total)  by mouth 2 (two) times daily with a meal. Qty: 60 tablet, Refills: 1    lisinopril (PRINIVIL,ZESTRIL) 20 MG tablet Take 1 tablet (20 mg total) by mouth daily. Qty: 30 tablet, Refills: 2    tamsulosin (FLOMAX) 0.4 MG CAPS capsule Take 1 capsule (0.4 mg total) by mouth daily. Qty: 30 capsule, Refills: 0              Today   CHIEF COMPLAINT:  Doing well this am no seizures reported overnight. Complaining of left shoulder pain  VITAL SIGNS:  Blood pressure (!) 151/98, pulse 88, temperature 97.9 F (36.6 C), temperature source Oral, resp. rate 18, height 5\' 11"  (1.803 m), weight 99.8 kg (220 lb 1.6 oz), SpO2 100 %.   REVIEW OF SYSTEMS:  Review of Systems  Constitutional: Negative.  Negative for chills, fever and malaise/fatigue.  HENT: Negative.  Negative for ear discharge, ear pain, hearing loss, nosebleeds and sore throat.   Eyes: Negative.  Negative for blurred vision and pain.  Respiratory: Negative.  Negative for cough, hemoptysis, shortness of breath and wheezing.   Cardiovascular: Negative.  Negative for chest pain, palpitations and leg swelling.  Gastrointestinal: Negative.  Negative for abdominal pain, blood in stool, diarrhea, nausea and vomiting.  Genitourinary: Negative.  Negative for dysuria.  Musculoskeletal: Negative.  Negative for back pain.       Shoulder pain  Skin: Negative.   Neurological: Negative for dizziness, tremors, speech  change, focal weakness, seizures and headaches.  Endo/Heme/Allergies: Negative.  Does not bruise/bleed easily.  Psychiatric/Behavioral: Negative.  Negative for depression, hallucinations and suicidal ideas.     PHYSICAL EXAMINATION:  GENERAL:  55 y.o.-year-old patient lying in the bed with no acute distress.  NECK:  Supple, no jugular venous distention. No thyroid enlargement, no tenderness.  LUNGS: Normal breath sounds bilaterally, no wheezing, rales,rhonchi  No use of accessory muscles of respiration.  CARDIOVASCULAR: S1, S2  normal. No murmurs, rubs, or gallops.  ABDOMEN: Soft, non-tender, non-distended. Bowel sounds present. No organomegaly or mass.  EXTREMITIES: No pedal edema, cyanosis, or clubbing. Left shoulder only 30 degrees flexion PSYCHIATRIC: The patient is alert and oriented x 3.  SKIN: No obvious rash, lesion, or ulcer.   DATA REVIEW:   CBC  Recent Labs Lab 08/03/16 0404  WBC 5.8  HGB 11.5*  HCT 35.2*  PLT 163    Chemistries   Recent Labs Lab 08/01/16 1900  08/03/16 0404 08/04/16 0502  NA  --   < > 136 134*  K  --   < > 3.6 4.1  CL  --   < > 104 102  CO2  --   < > 20* 24  GLUCOSE  --   < > 121* 136*  BUN  --   < > 18 19  CREATININE  --   < > 1.05 1.14  CALCIUM  --   < > 8.9 9.2  MG 1.7  --   --   --   AST  --   < > 20  --   ALT  --   < > 13*  --   ALKPHOS  --   < > 132*  --   BILITOT  --   < > 1.2  --   < > = values in this interval not displayed.  Cardiac Enzymes No results for input(s): TROPONINI in the last 168 hours.  Microbiology Results  @MICRORSLT48 @  RADIOLOGY:  Ct Head Wo Contrast  Result Date: 08/02/2016 CLINICAL DATA:  Altered mental status with seizure like activity. Small laceration to the back of head. EXAM: CT HEAD WITHOUT CONTRAST CT CERVICAL SPINE WITHOUT CONTRAST TECHNIQUE: Multidetector CT imaging of the head and cervical spine was performed following the standard protocol without intravenous contrast. Multiplanar CT image reconstructions of the cervical spine were also generated. COMPARISON:  07/02/2016 FINDINGS: CT HEAD FINDINGS Brain: No acute territorial infarction, intracranial hemorrhage, extra-axial fluid collection or focal mass lesion is visualized. Again visualized is an old right basal ganglia infarct and right old lacunar infarct in the periventricular white matter. Mild ex vacuo dilatation of the right ventricle. Mild periventricular white matter hypodensities consistent with small vessel disease. Vascular: No hyperdense vessels.  Carotid  artery calcifications. Skull: Mastoid air cells clear.  No fracture. Sinuses/Orbits: Mild mucosal thickening in the sphenoid and ethmoid sinuses. No acute orbital abnormality. Other: None CT CERVICAL SPINE FINDINGS Alignment: There is straightening of the cervical spine. Facet alignment is maintained. Skull base and vertebrae: Craniovertebral junction is intact. Partial fusion anterior aspect right occipital condyle to the body of C1. Vertebral body heights are maintained. There is no fracture. Soft tissues and spinal canal: Prevertebral soft tissue thickness appears normal. No paravertebral or paraspinal soft tissue abnormality. Disc levels: Moderate-to-marked disc changes at C5-C6, C6-C7. Posterior disc osteophyte complexes at C5-C6 with mild canal stenosis. Mild facet arthropathy. No significant foraminal stenosis. Upper chest: Lung apices clear.  Thyroid gland normal. Other: None IMPRESSION: 1.  No CT evidence for acute intracranial abnormality. Evidence of old right basal ganglial infarct. Mild periventricular white matter small vessel changes. 2. Straightening of the cervical spine. Multilevel degenerative disc changes. No acute fracture or malalignment. Electronically Signed   By: Jasmine Pang M.D.   On: 08/02/2016 19:57   Ct Cervical Spine Wo Contrast  Result Date: 08/02/2016 CLINICAL DATA:  Altered mental status with seizure like activity. Small laceration to the back of head. EXAM: CT HEAD WITHOUT CONTRAST CT CERVICAL SPINE WITHOUT CONTRAST TECHNIQUE: Multidetector CT imaging of the head and cervical spine was performed following the standard protocol without intravenous contrast. Multiplanar CT image reconstructions of the cervical spine were also generated. COMPARISON:  07/02/2016 FINDINGS: CT HEAD FINDINGS Brain: No acute territorial infarction, intracranial hemorrhage, extra-axial fluid collection or focal mass lesion is visualized. Again visualized is an old right basal ganglia infarct and right  old lacunar infarct in the periventricular white matter. Mild ex vacuo dilatation of the right ventricle. Mild periventricular white matter hypodensities consistent with small vessel disease. Vascular: No hyperdense vessels.  Carotid artery calcifications. Skull: Mastoid air cells clear.  No fracture. Sinuses/Orbits: Mild mucosal thickening in the sphenoid and ethmoid sinuses. No acute orbital abnormality. Other: None CT CERVICAL SPINE FINDINGS Alignment: There is straightening of the cervical spine. Facet alignment is maintained. Skull base and vertebrae: Craniovertebral junction is intact. Partial fusion anterior aspect right occipital condyle to the body of C1. Vertebral body heights are maintained. There is no fracture. Soft tissues and spinal canal: Prevertebral soft tissue thickness appears normal. No paravertebral or paraspinal soft tissue abnormality. Disc levels: Moderate-to-marked disc changes at C5-C6, C6-C7. Posterior disc osteophyte complexes at C5-C6 with mild canal stenosis. Mild facet arthropathy. No significant foraminal stenosis. Upper chest: Lung apices clear.  Thyroid gland normal. Other: None IMPRESSION: 1. No CT evidence for acute intracranial abnormality. Evidence of old right basal ganglial infarct. Mild periventricular white matter small vessel changes. 2. Straightening of the cervical spine. Multilevel degenerative disc changes. No acute fracture or malalignment. Electronically Signed   By: Jasmine Pang M.D.   On: 08/02/2016 19:57   Dg Shoulder Left  Result Date: 08/04/2016 CLINICAL DATA:  Pain after fall EXAM: LEFT SHOULDER - 2+ VIEW COMPARISON:  None. FINDINGS: There is a comminuted fracture of the humeral head without dislocation. IMPRESSION: Comminuted fracture of the humeral head without dislocation. Electronically Signed   By: Gerome Sam III M.D   On: 08/04/2016 09:20      Management plans discussed with the patient and he is in agreement. Stable for discharge  home  Patient should follow up with dr Joice Lofts  CODE STATUS:     Code Status Orders        Start     Ordered   08/02/16 2342  Full code  Continuous     08/02/16 2342    Code Status History    Date Active Date Inactive Code Status Order ID Comments User Context   07/02/2016  1:54 PM 07/03/2016  7:03 PM Full Code [id number]  Auburn Bilberry, MD ED      TOTAL TIME TAKING CARE OF THIS PATIENT: 38 minutes.    Note: This dictation was prepared with Dragon dictation along with smaller phrase technology. Any transcriptional errors that result from this process are unintentional.  Woodie Degraffenreid M.D on 08/04/2016 at 10:30 AM  Between 7am to 6pm - Pager - [phone] After 6pm go to [email or web address] - password [email or web address]  [employer]  [phone]  CC: Primary care physician; No PCP Per Patient

## 2016-08-04 NOTE — Progress Notes (Signed)
Discharge instructions along with home medications and follow up gone over with patient and sister. Both verbalize that they understood instructions. No prescriptions given to patient. IV removed. Pt being discharged home on room air, no distress noted. Otilio JeffersonMadelyn S Fenton, RN

## 2016-08-06 ENCOUNTER — Telehealth: Payer: Self-pay | Admitting: General Practice

## 2016-08-06 DIAGNOSIS — S42202A Unspecified fracture of upper end of left humerus, initial encounter for closed fracture: Secondary | ICD-10-CM | POA: Insufficient documentation

## 2016-08-06 HISTORY — DX: Unspecified fracture of upper end of left humerus, initial encounter for closed fracture: S42.202A

## 2016-08-06 NOTE — Telephone Encounter (Signed)
Called to schedule an appt for a hospital follow up and pt stated that he was now going to [facility] and no longer coming to [facility].

## 2017-10-07 ENCOUNTER — Ambulatory Visit: Payer: Self-pay | Admitting: Urology

## 2018-04-21 ENCOUNTER — Ambulatory Visit: Payer: Self-pay | Admitting: Unknown Physician Specialty

## 2018-05-29 ENCOUNTER — Ambulatory Visit: Payer: Self-pay | Admitting: Unknown Physician Specialty

## 2018-05-30 ENCOUNTER — Ambulatory Visit: Payer: Self-pay | Admitting: Unknown Physician Specialty

## 2018-06-06 ENCOUNTER — Ambulatory Visit: Payer: Medicaid Other | Admitting: Unknown Physician Specialty

## 2018-06-16 ENCOUNTER — Encounter: Payer: Self-pay | Admitting: General Practice

## 2020-01-27 ENCOUNTER — Emergency Department: Payer: Medicaid Other

## 2020-01-27 ENCOUNTER — Observation Stay
Admission: EM | Admit: 2020-01-27 | Discharge: 2020-01-28 | Disposition: A | Discharge status: Home / Self Care | Payer: Medicaid Other | Attending: Internal Medicine | Admitting: Internal Medicine

## 2020-01-27 ENCOUNTER — Other Ambulatory Visit: Payer: Self-pay

## 2020-01-27 DIAGNOSIS — R471 Dysarthria and anarthria: Secondary | ICD-10-CM | POA: Diagnosis not present

## 2020-01-27 DIAGNOSIS — R569 Unspecified convulsions: Secondary | ICD-10-CM | POA: Diagnosis not present

## 2020-01-27 DIAGNOSIS — I10 Essential (primary) hypertension: Secondary | ICD-10-CM | POA: Insufficient documentation

## 2020-01-27 DIAGNOSIS — I69354 Hemiplegia and hemiparesis following cerebral infarction affecting left non-dominant side: Secondary | ICD-10-CM | POA: Insufficient documentation

## 2020-01-27 DIAGNOSIS — M1990 Unspecified osteoarthritis, unspecified site: Secondary | ICD-10-CM | POA: Diagnosis not present

## 2020-01-27 DIAGNOSIS — F17210 Nicotine dependence, cigarettes, uncomplicated: Secondary | ICD-10-CM | POA: Insufficient documentation

## 2020-01-27 DIAGNOSIS — R55 Syncope and collapse: Secondary | ICD-10-CM

## 2020-01-27 DIAGNOSIS — R4189 Other symptoms and signs involving cognitive functions and awareness: Secondary | ICD-10-CM | POA: Diagnosis not present

## 2020-01-27 DIAGNOSIS — I6523 Occlusion and stenosis of bilateral carotid arteries: Secondary | ICD-10-CM | POA: Insufficient documentation

## 2020-01-27 DIAGNOSIS — E872 Acidosis: Secondary | ICD-10-CM

## 2020-01-27 DIAGNOSIS — M50322 Other cervical disc degeneration at C5-C6 level: Secondary | ICD-10-CM | POA: Diagnosis not present

## 2020-01-27 DIAGNOSIS — E785 Hyperlipidemia, unspecified: Secondary | ICD-10-CM | POA: Insufficient documentation

## 2020-01-27 DIAGNOSIS — Z79899 Other long term (current) drug therapy: Secondary | ICD-10-CM | POA: Diagnosis not present

## 2020-01-27 DIAGNOSIS — R404 Transient alteration of awareness: Secondary | ICD-10-CM | POA: Diagnosis present

## 2020-01-27 DIAGNOSIS — E119 Type 2 diabetes mellitus without complications: Secondary | ICD-10-CM | POA: Diagnosis not present

## 2020-01-27 DIAGNOSIS — I639 Cerebral infarction, unspecified: Secondary | ICD-10-CM

## 2020-01-27 DIAGNOSIS — Z20822 Contact with and (suspected) exposure to covid-19: Secondary | ICD-10-CM | POA: Insufficient documentation

## 2020-01-27 DIAGNOSIS — E118 Type 2 diabetes mellitus with unspecified complications: Secondary | ICD-10-CM

## 2020-01-27 DIAGNOSIS — R739 Hyperglycemia, unspecified: Secondary | ICD-10-CM

## 2020-01-27 DIAGNOSIS — F10239 Alcohol dependence with withdrawal, unspecified: Secondary | ICD-10-CM | POA: Diagnosis not present

## 2020-01-27 DIAGNOSIS — W19XXXA Unspecified fall, initial encounter: Secondary | ICD-10-CM | POA: Diagnosis not present

## 2020-01-27 DIAGNOSIS — N289 Disorder of kidney and ureter, unspecified: Secondary | ICD-10-CM | POA: Diagnosis present

## 2020-01-27 DIAGNOSIS — Z7982 Long term (current) use of aspirin: Secondary | ICD-10-CM | POA: Insufficient documentation

## 2020-01-27 DIAGNOSIS — N179 Acute kidney failure, unspecified: Secondary | ICD-10-CM | POA: Insufficient documentation

## 2020-01-27 DIAGNOSIS — F10230 Alcohol dependence with withdrawal, uncomplicated: Principal | ICD-10-CM

## 2020-01-27 HISTORY — DX: Syncope and collapse: R55

## 2020-01-27 LAB — COMPREHENSIVE METABOLIC PANEL
ALT: 49 U/L — ABNORMAL HIGH (ref 0–44)
AST: 114 U/L — ABNORMAL HIGH (ref 15–41)
Albumin: 4 g/dL (ref 3.5–5.0)
Alkaline Phosphatase: 79 U/L (ref 38–126)
Anion gap: 17 — ABNORMAL HIGH (ref 5–15)
BUN: 26 mg/dL — ABNORMAL HIGH (ref 6–20)
CO2: 15 mmol/L — ABNORMAL LOW (ref 22–32)
Calcium: 8.8 mg/dL — ABNORMAL LOW (ref 8.9–10.3)
Chloride: 100 mmol/L (ref 98–111)
Creatinine, Ser: 1.96 mg/dL — ABNORMAL HIGH (ref 0.61–1.24)
GFR calc Af Amer: 42 mL/min — ABNORMAL LOW (ref >60)
GFR calc non Af Amer: 36 mL/min — ABNORMAL LOW (ref >60)
Glucose, Bld: 238 mg/dL — ABNORMAL HIGH (ref 70–99)
Potassium: 4 mmol/L (ref 3.5–5.1)
Sodium: 132 mmol/L — ABNORMAL LOW (ref 135–145)
Total Bilirubin: 1 mg/dL (ref 0.3–1.2)
Total Protein: 8.3 g/dL — ABNORMAL HIGH (ref 6.5–8.1)

## 2020-01-27 LAB — CBC WITH DIFFERENTIAL/PLATELET
Abs Immature Granulocytes: 0.04 10*3/uL (ref 0.00–0.07)
Basophils Absolute: 0.1 10*3/uL (ref 0.0–0.1)
Basophils Relative: 1 %
Eosinophils Absolute: 0 10*3/uL (ref 0.0–0.5)
Eosinophils Relative: 0 %
HCT: 39.4 % (ref 39.0–52.0)
Hemoglobin: 13.2 g/dL (ref 13.0–17.0)
Immature Granulocytes: 1 %
Lymphocytes Relative: 15 %
Lymphs Abs: 0.8 10*3/uL (ref 0.7–4.0)
MCH: 29.4 pg (ref 26.0–34.0)
MCHC: 33.5 g/dL (ref 30.0–36.0)
MCV: 87.8 fL (ref 80.0–100.0)
Monocytes Absolute: 0.3 10*3/uL (ref 0.1–1.0)
Monocytes Relative: 5 %
Neutro Abs: 3.9 10*3/uL (ref 1.7–7.7)
Neutrophils Relative %: 78 %
Platelets: UNDETERMINED 10*3/uL (ref 150–400)
RBC: 4.49 MIL/uL (ref 4.22–5.81)
RDW: 13.8 % (ref 11.5–15.5)
WBC: 5 10*3/uL (ref 4.0–10.5)
nRBC: 0 % (ref 0.0–0.2)

## 2020-01-27 LAB — APTT: aPTT: <24 seconds — ABNORMAL LOW (ref 24–36)

## 2020-01-27 LAB — HEMOGLOBIN A1C
Hgb A1c MFr Bld: 7.2 % — ABNORMAL HIGH (ref 4.8–5.6)
Mean Plasma Glucose: 159.94 mg/dL

## 2020-01-27 LAB — RESPIRATORY PANEL BY RT PCR (FLU A&B, COVID)
Influenza A by PCR: NEGATIVE
Influenza B by PCR: NEGATIVE
SARS Coronavirus 2 by RT PCR: NEGATIVE

## 2020-01-27 LAB — TROPONIN I (HIGH SENSITIVITY)
Troponin I (High Sensitivity): 9 ng/L (ref <18)
Troponin I (High Sensitivity): 18 ng/L — ABNORMAL HIGH (ref <18)

## 2020-01-27 LAB — GLUCOSE, CAPILLARY: Glucose-Capillary: 172 mg/dL — ABNORMAL HIGH (ref 70–99)

## 2020-01-27 LAB — PROTIME-INR
INR: 1.1 (ref 0.8–1.2)
Prothrombin Time: 13.6 seconds (ref 11.4–15.2)

## 2020-01-27 LAB — ETHANOL: Alcohol, Ethyl (B): <10 mg/dL (ref <10)

## 2020-01-27 LAB — CK: Total CK: 234 U/L (ref 49–397)

## 2020-01-27 LAB — BETA-HYDROXYBUTYRIC ACID: Beta-Hydroxybutyric Acid: 0.93 mmol/L — ABNORMAL HIGH (ref 0.05–0.27)

## 2020-01-27 MED ORDER — FOLIC ACID 1 MG PO TABS
1.0000 mg | ORAL_TABLET | Freq: Every day | ORAL | Status: DC
  Administered 2020-01-27 – 2020-01-28 (×2): 1 mg via ORAL
  Filled 2020-01-27 (×2): qty 1

## 2020-01-27 MED ORDER — SODIUM CHLORIDE 0.9 % IV BOLUS
500.0000 mL | Freq: Once | INTRAVENOUS | Status: AC
  Administered 2020-01-27: 500 mL via INTRAVENOUS

## 2020-01-27 MED ORDER — TETANUS-DIPHTH-ACELL PERTUSSIS 5-2.5-18.5 LF-MCG/0.5 IM SUSP
0.5000 mL | Freq: Once | INTRAMUSCULAR | Status: AC
  Administered 2020-01-27: 0.5 mL via INTRAMUSCULAR
  Filled 2020-01-27: qty 0.5

## 2020-01-27 MED ORDER — SODIUM CHLORIDE 0.9 % IV BOLUS
1000.0000 mL | Freq: Once | INTRAVENOUS | Status: AC
  Administered 2020-01-27: 1000 mL via INTRAVENOUS

## 2020-01-27 MED ORDER — CARVEDILOL 25 MG PO TABS
25.0000 mg | ORAL_TABLET | Freq: Two times a day (BID) | ORAL | Status: DC
  Administered 2020-01-27 – 2020-01-28 (×3): 25 mg via ORAL
  Filled 2020-01-27 (×3): qty 1

## 2020-01-27 MED ORDER — THIAMINE HCL 100 MG PO TABS
100.0000 mg | ORAL_TABLET | Freq: Every day | ORAL | Status: DC
  Administered 2020-01-27 – 2020-01-28 (×2): 100 mg via ORAL
  Filled 2020-01-27 (×2): qty 1

## 2020-01-27 MED ORDER — LORAZEPAM 2 MG/ML IJ SOLN
2.0000 mg | Freq: Once | INTRAMUSCULAR | Status: AC
  Administered 2020-01-27: 2 mg via INTRAVENOUS
  Filled 2020-01-27: qty 1

## 2020-01-27 MED ORDER — INSULIN ASPART 100 UNIT/ML ~~LOC~~ SOLN
0.0000 [IU] | Freq: Three times a day (TID) | SUBCUTANEOUS | Status: DC
  Filled 2020-01-27: qty 1

## 2020-01-27 MED ORDER — ONDANSETRON HCL 4 MG/2ML IJ SOLN: 4.0000 mg | Freq: Four times a day (QID) | INTRAMUSCULAR | Status: DC | PRN

## 2020-01-27 MED ORDER — ENOXAPARIN SODIUM 40 MG/0.4ML ~~LOC~~ SOLN: 30.0000 mg | SUBCUTANEOUS | Status: DC

## 2020-01-27 MED ORDER — THIAMINE HCL 100 MG/ML IJ SOLN
100.0000 mg | Freq: Every day | INTRAMUSCULAR | Status: DC
  Filled 2020-01-27: qty 2

## 2020-01-27 MED ORDER — ADULT MULTIVITAMIN W/MINERALS CH
1.0000 | ORAL_TABLET | Freq: Every day | ORAL | Status: DC
  Administered 2020-01-27 – 2020-01-28 (×2): 1 via ORAL
  Filled 2020-01-27 (×2): qty 1

## 2020-01-27 MED ORDER — ONDANSETRON HCL 4 MG PO TABS: 4.0000 mg | ORAL_TABLET | Freq: Four times a day (QID) | ORAL | Status: DC | PRN

## 2020-01-27 MED ORDER — ACETAMINOPHEN 325 MG PO TABS
650.0000 mg | ORAL_TABLET | Freq: Four times a day (QID) | ORAL | Status: DC | PRN
  Administered 2020-01-27: 650 mg via ORAL
  Filled 2020-01-27: qty 2

## 2020-01-27 MED ORDER — ATORVASTATIN CALCIUM 20 MG PO TABS
20.0000 mg | ORAL_TABLET | Freq: Every day | ORAL | Status: DC
  Administered 2020-01-27 – 2020-01-28 (×2): 20 mg via ORAL
  Filled 2020-01-27 (×2): qty 1

## 2020-01-27 MED ORDER — ENOXAPARIN SODIUM 40 MG/0.4ML ~~LOC~~ SOLN
40.0000 mg | SUBCUTANEOUS | Status: DC
  Administered 2020-01-27: 40 mg via SUBCUTANEOUS
  Filled 2020-01-27: qty 0.4

## 2020-01-27 MED ORDER — LISINOPRIL 20 MG PO TABS
20.0000 mg | ORAL_TABLET | Freq: Every day | ORAL | Status: DC
  Administered 2020-01-27 – 2020-01-28 (×2): 20 mg via ORAL
  Filled 2020-01-27: qty 1
  Filled 2020-01-27: qty 2

## 2020-01-27 MED ORDER — TAMSULOSIN HCL 0.4 MG PO CAPS
0.4000 mg | ORAL_CAPSULE | Freq: Every day | ORAL | Status: DC
  Administered 2020-01-27 – 2020-01-28 (×2): 0.4 mg via ORAL
  Filled 2020-01-27 (×2): qty 1

## 2020-01-27 MED ORDER — AMLODIPINE BESYLATE 5 MG PO TABS
5.0000 mg | ORAL_TABLET | Freq: Every day | ORAL | Status: DC
  Administered 2020-01-27 – 2020-01-28 (×2): 5 mg via ORAL
  Filled 2020-01-27 (×2): qty 1

## 2020-01-27 MED ORDER — ACETAMINOPHEN 650 MG RE SUPP: 650.0000 mg | Freq: Four times a day (QID) | RECTAL | Status: DC | PRN

## 2020-01-27 MED ORDER — SODIUM CHLORIDE 0.9 % IV SOLN: INTRAVENOUS | Status: DC

## 2020-01-27 MED ORDER — LORAZEPAM 2 MG/ML IJ SOLN: 1.0000 mg | INTRAMUSCULAR | Status: DC | PRN

## 2020-01-27 MED ORDER — LORAZEPAM 1 MG PO TABS: 1.0000 mg | ORAL_TABLET | ORAL | Status: DC | PRN

## 2020-01-27 MED ORDER — LORAZEPAM 2 MG/ML IJ SOLN
1.0000 mg | Freq: Once | INTRAMUSCULAR | Status: AC
  Administered 2020-01-27: 1 mg via INTRAVENOUS
  Filled 2020-01-27: qty 1

## 2020-01-27 MED ORDER — NICOTINE 14 MG/24HR TD PT24
14.0000 mg | MEDICATED_PATCH | Freq: Every day | TRANSDERMAL | Status: DC
  Administered 2020-01-27: 14 mg via TRANSDERMAL
  Filled 2020-01-27: qty 1

## 2020-01-27 MED ORDER — HYDRALAZINE HCL 25 MG PO TABS
25.0000 mg | ORAL_TABLET | Freq: Four times a day (QID) | ORAL | Status: DC
  Administered 2020-01-27 – 2020-01-28 (×5): 25 mg via ORAL
  Filled 2020-01-27 (×5): qty 1

## 2020-01-27 NOTE — ED Notes (Signed)
Transport to floor room 220.AS

## 2020-01-27 NOTE — ED Notes (Signed)
Report called to kim rn floor nurse 

## 2020-01-27 NOTE — H&P (Signed)
Brad Graham is an 59 y.o. male.   Chief Complaint: The patient was found down in his room at a group home. HPI: The patient is a 59 yr old man who carries a past medical history significant for alcohol withdrawal seizures, arthritis, CVA, hyperlipidemia, hypertension. He is a resident of a group home. The patient states that he can remember waking up this morning, but nothing since then until he EMS arrived. Apparently the patient was confused when they arrived, but was oriented once he was evaluated in [facility] ED.  The patient states that he drinks at least 1/2 of 1/5 each evening. He states that he has been in his usual state of health.   He denies fevers, chills, cough (although he coughs 3 times while I am in the room), shortness of breath, chest pain, nausea, vomiting, constipation, diarrhea. He denies wounds, sores, swelling, or rashes. He states that he has no trouble starting a stream of urine, although he states that he is urinating more frequently lately.  Past Medical History:  Diagnosis Date  . Alcohol abuse   . Arthritis   . H/O: CVA (cerebrovascular accident)   . Hyperlipidemia   . Hypertension     Past Surgical History:  Procedure Laterality Date  . none      Family History  Problem Relation Age of Onset  . Diabetes Mother   . Stroke Mother   . Heart disease Maternal Grandmother   . Heart disease Maternal Grandfather    Social History:  reports that he has been smoking cigarettes. He has been smoking about 0.25 packs per day. He has never used smokeless tobacco. He reports current alcohol use. He reports that he does not use drugs. (Not in a hospital admission)   Allergies: No Known Allergies  Pertinent items noted in HPI and remainder of comprehensive ROS otherwise negative.   General appearance: alert, cooperative, appears older than stated age and no distress Head: Normocephalic, without obvious abnormality, atraumatic Eyes: conjunctivae/corneas clear.  PERRL, EOM's intact. Fundi benign. Throat: lips, mucosa, and tongue normal; teeth and gums normal Neck: no adenopathy, no carotid bruit, no JVD, supple, symmetrical, trachea midline and thyroid not enlarged, symmetric, no tenderness/mass/nodules Resp: Diminished breath sounds bilaterally. No wheezes, rales, or rhonchi. No tactile fremitus. Chest wall: no tenderness Cardio: regular rate and rhythm, S1, S2 normal, no murmur, click, rub or gallop GI: soft, non-tender; bowel sounds normal; no masses,  no organomegaly Extremities: extremities normal, atraumatic, no cyanosis or edema Pulses: 2+ and symmetric Skin: Skin color, texture, turgor normal. No rashes or lesions Lymph nodes: Cervical, supraclavicular, and axillary nodes normal. Neurologic: Alert and oriented X 3, normal strength and tone. Normal symmetric reflexes. Normal coordination and gait  Results for orders placed or performed during the hospital encounter of 01/27/20 (from the past 48 hour(s))  Comprehensive metabolic panel     Status: Abnormal   Collection Time: 01/27/20  2:54 PM  Result Value Ref Range   Sodium 132 (L) 135 - 145 mmol/L   Potassium 4.0 3.5 - 5.1 mmol/L    Comment: HEMOLYSIS AT THIS LEVEL MAY AFFECT RESULT   Chloride 100 98 - 111 mmol/L   CO2 15 (L) 22 - 32 mmol/L   Glucose, Bld 238 (H) 70 - 99 mg/dL    Comment: Glucose reference range applies only to samples taken after fasting for at least 8 hours.   BUN 26 (H) 6 - 20 mg/dL   Creatinine, Ser 2.77 (H) 0.61 - 1.24  mg/dL   Calcium 8.8 (L) 8.9 - 10.3 mg/dL   Total Protein 8.3 (H) 6.5 - 8.1 g/dL   Albumin 4.0 3.5 - 5.0 g/dL   AST 680 (H) 15 - 41 U/L   ALT 49 (H) 0 - 44 U/L   Alkaline Phosphatase 79 38 - 126 U/L   Total Bilirubin 1.0 0.3 - 1.2 mg/dL   GFR calc non Af Amer 36 (L) >60 mL/min   GFR calc Af Amer 42 (L) >60 mL/min   Anion gap 17 (H) 5 - 15    Comment: Performed at [facility], [address] [place], [place] [address]  CBC with  Differential/Platelet     Status: None   Collection Time: 01/27/20  2:54 PM  Result Value Ref Range   WBC 5.0 4.0 - 10.5 K/uL   RBC 4.49 4.22 - 5.81 MIL/uL   Hemoglobin 13.2 13.0 - 17.0 g/dL   HCT 48.2 50.0 - 37.0 %   MCV 87.8 80.0 - 100.0 fL   MCH 29.4 26.0 - 34.0 pg   MCHC 33.5 30.0 - 36.0 g/dL   RDW 48.8 89.1 - 69.4 %   Platelets PLATELET CLUMPS NOTED ON SMEAR, UNABLE TO ESTIMATE 150 - 400 K/uL    Comment: Immature Platelet Fraction may be clinically indicated, consider ordering this additional test [id number]    nRBC 0.0 0.0 - 0.2 %   Neutrophils Relative % 78 %   Neutro Abs 3.9 1.7 - 7.7 K/uL   Lymphocytes Relative 15 %   Lymphs Abs 0.8 0.7 - 4.0 K/uL   Monocytes Relative 5 %   Monocytes Absolute 0.3 0.1 - 1.0 K/uL   Eosinophils Relative 0 %   Eosinophils Absolute 0.0 0.0 - 0.5 K/uL   Basophils Relative 1 %   Basophils Absolute 0.1 0.0 - 0.1 K/uL   Immature Granulocytes 1 %   Abs Immature Granulocytes 0.04 0.00 - 0.07 K/uL    Comment: Performed at [facility], [address] [place], [place] [address]  Troponin I (High Sensitivity)     Status: None   Collection Time: 01/27/20  3:08 PM  Result Value Ref Range   Troponin I (High Sensitivity) 9 <18 ng/L    Comment: (NOTE) Elevated high sensitivity troponin I (hsTnI) values and significant  changes across serial measurements may suggest ACS but many other  chronic and acute conditions are known to elevate hsTnI results.  Refer to the "Links" section for chest pain algorithms and additional  guidance. Performed at [facility], [address] [place], [place] [address]   Protime-INR     Status: None   Collection Time: 01/27/20  3:23 PM  Result Value Ref Range   Prothrombin Time 13.6 11.4 - 15.2 seconds   INR 1.1 0.8 - 1.2    Comment: (NOTE) INR goal varies based on device and disease states. Performed at [facility], [address] [place], [place] [address]   APTT     Status:  Abnormal   Collection Time: 01/27/20  3:23 PM  Result Value Ref Range   aPTT <24 (L) 24 - 36 seconds    Comment: REPEATED TO VERIFY SPECIMEN CHECKED FOR CLOTS SLIGHT HEMOLYSIS Performed at [facility], [address] [place], [place] [address]   Ethanol     Status: None   Collection Time: 01/27/20  3:23 PM  Result Value Ref Range   Alcohol, Ethyl (B) <10 <10 mg/dL    Comment: (NOTE) Lowest detectable  limit for serum alcohol is 10 mg/dL. For medical purposes only. Performed at [facility], [address] [place], [place] [address]   CK     Status: None   Collection Time: 01/27/20  3:32 PM  Result Value Ref Range   Total CK 234 49 - 397 U/L    Comment: Performed at [facility], [address] [place], [place] [address]  Respiratory Panel by RT PCR (Flu A&B, Covid) - Nasopharyngeal Swab     Status: None   Collection Time: 01/27/20  4:38 PM   Specimen: Nasopharyngeal Swab  Result Value Ref Range   SARS Coronavirus 2 by RT PCR NEGATIVE NEGATIVE    Comment: (NOTE) SARS-CoV-2 target nucleic acids are NOT DETECTED. The SARS-CoV-2 RNA is generally detectable in upper respiratoy specimens during the acute phase of infection. The lowest concentration of SARS-CoV-2 viral copies this assay can detect is 131 copies/mL. A negative result does not preclude SARS-Cov-2 infection and should not be used as the sole basis for treatment or other patient management decisions. A negative result may occur with  improper specimen collection/handling, submission of specimen other than nasopharyngeal swab, presence of viral mutation(s) within the areas targeted by this assay, and inadequate number of viral copies (<131 copies/mL). A negative result must be combined with clinical observations, patient history, and epidemiological information. The expected result is Negative. Fact Sheet for Patients:  PinkCheek.be Fact Sheet for  Healthcare Providers:  GravelBags.it This test is not yet ap proved or cleared by the [place] FDA and  has been authorized for detection and/or diagnosis of SARS-CoV-2 by FDA under an Emergency Use Authorization (EUA). This EUA will remain  in effect (meaning this test can be used) for the duration of the COVID-19 declaration under Section 564(b)(1) of the Act, 21 U.S.C. section 360bbb-3(b)(1), unless the authorization is terminated or revoked sooner.    Influenza A by PCR NEGATIVE NEGATIVE   Influenza B by PCR NEGATIVE NEGATIVE    Comment: (NOTE) The Xpert Xpress SARS-CoV-2/FLU/RSV assay is intended as an aid in  the diagnosis of influenza from Nasopharyngeal swab specimens and  should not be used as a sole basis for treatment. Nasal washings and  aspirates are unacceptable for Xpert Xpress SARS-CoV-2/FLU/RSV  testing. Fact Sheet for Patients: PinkCheek.be Fact Sheet for Healthcare Providers: GravelBags.it This test is not yet approved or cleared by the [place] FDA and  has been authorized for detection and/or diagnosis of SARS-CoV-2 by  FDA under an Emergency Use Authorization (EUA). This EUA will remain  in effect (meaning this test can be used) for the duration of the  Covid-19 declaration under Section 564(b)(1) of the Act, 21  U.S.C. section 360bbb-3(b)(1), unless the authorization is  terminated or revoked. Performed at [name], [address] [place], [place] [address]   Beta-hydroxybutyric acid     Status: Abnormal   Collection Time: 01/27/20  5:36 PM  Result Value Ref Range   Beta-Hydroxybutyric Acid 0.93 (H) 0.05 - 0.27 mmol/L    Comment: Performed at [facility], [address] [place], [place] [address]   @[email or web address] @  Blood pressure (!) 193/172, pulse (!) 116, temperature 98.4 F (36.9 C), temperature source Oral, resp. rate (!) 23, height  5\' 11"  (1.803 m), weight 104.3 kg, SpO2 95 %.    Assessment/Plan Unresponsive: Pt found "down" by friends. DDX: ETOH seizure, seizure, syncope, CVA. The patient is admitted to a telemetry bed. He will have an echocardiogram. He will  be ruled out for MI by an EKG and enzyme criteria. UDS is pending. Alcohol level is less than 10. The patient is on a CIWA protocol.  AKI: Creatinine 1.96. Last creatinine measured was 1.14 in 2017. Will check renal ultrasound and give IV fluids. Avoid nephrotoxins and hypotension. Monitor creatinine, electrolytes, and volume status.  Hyperglycemia: HbA1c in 2017 was 6.2. Pt notably hyperglycemia upon admission today. Will recheck HbA1c. As the patient is acidotic, have also ordered a beta hydroxy butyrate.  Monitor. Will place the patient on FSBS and SSI. Monitor.  Essential Hypertension: Blood pressures are very elevated. Will start with continuation of home doses of metoprolol and norvasc. Hold lisinorpil given AKI.   History of CVA: Will check MRI brain, echocardiogram, and carotid dopplers. CT head from ED was negative for acute CVA.  Dysarthria: Pt stutters and admits that he frequently has difficulty with word finding. This is not a new problem.  I have seen and examined this patient myself. I have spent 78 minutes in his evaluation and care.  DVT prophylaxis: Lovenox CODE STATUS: Full Code Family communication: None available Disposition: The patient is from a group home. He is admitted to observation status.  Anticipate discharge to group home. Barriers to discharge include need to evaluate cause for episode of unresponsiveness and confusion, elevated creatinine and acidosis.  Dim Meisinger 01/27/2020, 6:18 PM

## 2020-01-27 NOTE — ED Notes (Signed)
Pt alert  Family with pt iv fluids infusing  meds given.

## 2020-01-27 NOTE — ED Notes (Signed)
Ativan given again per md order.  Pt alert.  No acute distress.  Iv fluids infusing.  Sinus tach on monitor.

## 2020-01-27 NOTE — ED Notes (Signed)
Pt in ct scan  

## 2020-01-27 NOTE — ED Triage Notes (Signed)
Pt arrives via ems from [address]. Neighbor found pt down, unwitnessed fall. Ems reports on their arrival to pt he was confused, unable to name people around him or year, cool and diaphoretic. Pt now a&o x 4 on arrival to ed. HX stroke with left sided weakness. Pt states he does not recall falling. NAD noted at this time. Abrasion to the left side of head that is not actively bleeding at this time

## 2020-01-27 NOTE — ED Notes (Signed)
meds given .  Sinus tach on monitor.  Pt alert.  No headache.  drinking sips of water.

## 2020-01-27 NOTE — ED Notes (Signed)
Pt unable to void at this time. 

## 2020-01-27 NOTE — ED Provider Notes (Signed)
[facility] Emergency Department Provider Note  ____________________________________________   First MD Initiated Contact with Patient 01/27/20 1457     (approximate)  I have reviewed the triage vital signs and the nursing notes.   HISTORY  Chief Complaint Fall    HPI Brad Graham is a 58 y.o. male with alcohol abuse, hypertension, hyperlipidemia, stroke with left-sided weakness who comes in for a fall.  Neighbor found patient down inside the house.  Unwitnessed fall.  Patient is confused and unable to name people around him or the year.  But was then alert and oriented x4 later on.  Patient states he is unsure why he fell.  Patient states that he drinks 1/5 of liquor daily plus beers.  Stated he last drank yesterday.  He denies a history of withdrawal in the past or withdrawal seizures.  He denies any chest pain, shortness of breath, abdominal pain, extremity pain.  He states that he did hit his head because he does have a bump on the left side of his head.  He otherwise denies any symptoms.  On review of records patient was admitted 3 years ago for alcohol related seizures.  He was also hyponatremic.  Fall occurred today, unclear what brought it on, nothing made it better, occurred 1 time     Past Medical History:  Diagnosis Date  . Alcohol abuse   . Arthritis   . H/O: CVA (cerebrovascular accident)   . Hyperlipidemia   . Hypertension     Patient Active Problem List   Diagnosis Date Noted  . Seizure (HCC) 08/02/2016  . Syncope 07/02/2016  . Essential hypertension 05/10/2015  . Hyperglycemia 05/10/2015  . Hyperlipidemia 05/10/2015  . Proteinuria 05/10/2015    Past Surgical History:  Procedure Laterality Date  . none      Prior to Admission medications   Medication Sig Start Date End Date Taking? Authorizing Provider  amLODipine (NORVASC) 5 MG tablet Take 1 tablet (5 mg total) by mouth daily. 11/17/15   Gabriel Cirri, NP  aspirin 325  MG EC tablet Take 1 tablet (325 mg total) by mouth daily. 07/03/16   Enid Baas, MD  atorvastatin (LIPITOR) 20 MG tablet Take 1 tablet (20 mg total) by mouth daily at 6 PM. Patient taking differently: Take 20 mg by mouth daily.  04/19/16   Gabriel Cirri, NP  carvedilol (COREG) 25 MG tablet Take 1 tablet (25 mg total) by mouth 2 (two) times daily with a meal. 04/19/16   Gabriel Cirri, NP  lisinopril (PRINIVIL,ZESTRIL) 20 MG tablet Take 1 tablet (20 mg total) by mouth daily. 07/03/16   Enid Baas, MD  tamsulosin (FLOMAX) 0.4 MG CAPS capsule Take 1 capsule (0.4 mg total) by mouth daily. 03/20/16   Gabriel Cirri, NP    Allergies Patient has no known allergies.  Family History  Problem Relation Age of Onset  . Diabetes Mother   . Stroke Mother   . Heart disease Maternal Grandmother   . Heart disease Maternal Grandfather     Social History Social History   Tobacco Use  . Smoking status: Current Some Day Smoker    Packs/day: 0.25    Types: Cigarettes  . Smokeless tobacco: Never Used  Substance Use Topics  . Alcohol use: Yes    Comment: "1/2 pint liquor/day"  . Drug use: No      Review of Systems Constitutional: No fever/chills, fall Eyes: No visual changes. ENT: No sore throat. Cardiovascular: Denies chest pain. Respiratory: Denies  shortness of breath. Gastrointestinal: No abdominal pain.  No nausea, no vomiting.  No diarrhea.  No constipation. Genitourinary: Negative for dysuria. Musculoskeletal: Negative for back pain. Skin: Negative for rash. Neurological: Negative for headaches, focal weakness or numbness.  Positive headache All other ROS negative ____________________________________________   PHYSICAL EXAM:  VITAL SIGNS: ED Triage Vitals  Enc Vitals Group     BP 01/27/20 1447 (!) 182/112     Pulse Rate 01/27/20 1445 (!) 117     Resp 01/27/20 1445 (!) 24     Temp 01/27/20 1447 98.4 F (36.9 C)     Temp Source 01/27/20 1447 Oral     SpO2  01/27/20 1445 96 %     Weight 01/27/20 1448 230 lb (104.3 kg)     Height 01/27/20 1448 5\' 11"  (1.803 m)     Head Circumference --      Peak Flow --      Pain Score 01/27/20 1448 0     Pain Loc --      Pain Edu? --      Excl. in [place]? --     Constitutional: Alert and oriented. GCS 15  Eyes: Conjunctivae are normal. EOMI. Head: Small abrasion on the back of his left side of his head Nose: No congestion/rhinnorhea. Mouth/Throat: Mucous membranes are moist.  Slight tongue tremor Neck: No stridor. Trachea Midline. FROM Cardiovascular: tachycardia, regular rhythm. Grossly normal heart sounds.  Good peripheral circulation. No chest wall tenderness Respiratory: Normal respiratory effort.  No retractions. Lungs CTAB. Gastrointestinal: Soft and nontender. No distention. No abdominal bruits.  Musculoskeletal: Slightly tremulous  RUE: No point tenderness, deformity or other signs of injury. Radial pulse intact. Neuro intact. Full ROM in joint. LUE: No point tenderness, deformity or other signs of injury. Radial pulse intact. Neuro intact. Full ROM in joints RLE: No point tenderness, deformity or other signs of injury. DP pulse intact. Neuro intact. Full ROM in joints. LLE: No point tenderness, deformity or other signs of injury. DP pulse intact. Neuro intact. Full ROM in joints. Neurologic:  Normal speech and language. No gross focal neurologic deficits are appreciated.  Skin:  Skin is warm, dry and intact. No rash noted. Psychiatric: Mood and affect are normal. Speech and behavior are normal. GU: Deferred   ____________________________________________   LABS (all labs ordered are listed, but only abnormal results are displayed)  Labs Reviewed  COMPREHENSIVE METABOLIC PANEL - Abnormal; Notable for the following components:      Result Value   Sodium 132 (*)    CO2 15 (*)    Glucose, Bld 238 (*)    BUN 26 (*)    Creatinine, Ser 1.96 (*)    Calcium 8.8 (*)    Total Protein 8.3 (*)    AST  114 (*)    ALT 49 (*)    GFR calc non Af Amer 36 (*)    GFR calc Af Amer 42 (*)    Anion gap 17 (*)    All other components within normal limits  APTT - Abnormal; Notable for the following components:   aPTT <24 (*)    All other components within normal limits  BETA-HYDROXYBUTYRIC ACID - Abnormal; Notable for the following components:   Beta-Hydroxybutyric Acid 0.93 (*)    All other components within normal limits  RESPIRATORY PANEL BY RT PCR (FLU A&B, COVID)  URINE CULTURE  PROTIME-INR  ETHANOL  CK  CBC WITH DIFFERENTIAL/PLATELET  CBC WITH DIFFERENTIAL/PLATELET  URINE DRUG SCREEN, QUALITATIVE ([facility]  ONLY)  URINALYSIS, COMPLETE ([facility]) WITH MICROSCOPIC  DRUG SCREEN 147829 11+OXYCO+ALC+CRT-BUND  HEMOGLOBIN A1C  HIV ANTIBODY (ROUTINE TESTING W REFLEX)  CBC  COMPREHENSIVE METABOLIC PANEL  CBC  CBG MONITORING, ED  TROPONIN I (HIGH SENSITIVITY)  TROPONIN I (HIGH SENSITIVITY)   ____________________________________________   ED ECG REPORT I, Concha Se, the attending physician, personally viewed and interpreted this ECG.  EKG sinus tachycardia rate of 117, no ST elevation, no T wave inversions, normal intervals ____________________________________________  RADIOLOGY   Official radiology report(s): CT Head Wo Contrast  Result Date: 01/27/2020 CLINICAL DATA:  Head trauma, headache Found down, unwitnessed fall. EXAM: CT HEAD WITHOUT CONTRAST TECHNIQUE: Contiguous axial images were obtained from the base of the skull through the vertex without intravenous contrast. COMPARISON:  Head CT 08/02/2016 FINDINGS: Brain: No intracranial hemorrhage, mass effect, or midline shift. Mild to moderate chronic small vessel ischemia. Remote lacunar infarct in right basal ganglia, unchanged. No hydrocephalus. The basilar cisterns are patent. No evidence of territorial infarct or acute ischemia. No extra-axial or intracranial fluid collection. Vascular: Atherosclerosis of skullbase vasculature  without hyperdense vessel or abnormal calcification. Skull: No fracture or focal lesion. Sinuses/Orbits: Mild retained secretions in right side of sphenoid sinus. Paranasal sinuses are otherwise clear. Included orbits are unremarkable. Other: None. IMPRESSION: 1. No acute intracranial abnormality. No skull fracture. 2. Unchanged chronic small vessel ischemia and remote lacunar infarct in the right basal ganglia. Electronically Signed   By: Narda Rutherford M.D.   On: 01/27/2020 15:53   CT Cervical Spine Wo Contrast  Result Date: 01/27/2020 CLINICAL DATA:  Neck trauma, uncomplicated (NEXUS/CCR neg) (Age [phone]) Patient found down, unwitnessed fall. EXAM: CT CERVICAL SPINE WITHOUT CONTRAST TECHNIQUE: Multidetector CT imaging of the cervical spine was performed without intravenous contrast. Multiplanar CT image reconstructions were also generated. COMPARISON:  Cervical spine CT 08/02/2016 FINDINGS: Alignment: Straightening of normal lordosis, unchanged from prior exam. No traumatic subluxation. Skull base and vertebrae: No acute fracture. Vertebral body heights are maintained. The dens and skull base are intact. Soft tissues and spinal canal: No prevertebral fluid or swelling. No visible canal hematoma. Disc levels: Degenerative disc disease at C5-C6 and C6-C7, similar to prior. There is scattered facet hypertrophy. Upper chest: No acute findings. Other: None. IMPRESSION: Degenerative change in the cervical spine without acute fracture or subluxation. Electronically Signed   By: Narda Rutherford M.D.   On: 01/27/2020 15:57    ____________________________________________   PROCEDURES  Procedure(s) performed (including Critical Care):  Procedures   ____________________________________________   INITIAL IMPRESSION / ASSESSMENT AND PLAN / ED COURSE    Brad Graham was evaluated in Emergency Department on 01/27/2020 for the symptoms described in the history of present illness. He was evaluated in  the context of the global COVID-19 pandemic, which necessitated consideration that the patient might be at risk for infection with the SARS-CoV-2 virus that causes COVID-19. Institutional protocols and algorithms that pertain to the evaluation of patients at risk for COVID-19 are in a state of rapid change based on information released by regulatory bodies including the CDC and federal and state organizations. These policies and algorithms were followed during the patient's care in the ED.    Patient comes in with fall and it seems like he could have been post ictal afterwards.  Given his history of alcohol abuse he states that he last drank yesterday I suspect that he had an alcohol withdrawal seizure.  Will get labs to evaluate for electrolyte abnormalities, AKI.  CT head  evaluate for intracranial hemorrhage and CT cervical evaluate for cervical fracture given he is not the best historian.  Will start patient on CIWA protocol and give some Ativan given his tachycardia, hypertension and tremulousness.    Patient's creatinine is 1.96.  Will get a CK level.  This could be from rhabdo given unknown downtime.  Will give 500 cc of fluid  LFTs are elevated most likely secondary to alcohol use.  Patient given 1 mg of Ativan without much change in his tachycardia and hypertension.  Will give some more Ativan  Patient given another 2 mg of IV Ativan  Discussed with hospital team given I am concerned that this was a alcohol withdrawal seizure and patient is new AKI    ____________________________________________   FINAL CLINICAL IMPRESSION(S) / ED DIAGNOSES   Final diagnoses:  Alcohol withdrawal seizure without complication (HCC)  AKI (acute kidney injury) (HCC)      MEDICATIONS GIVEN DURING THIS VISIT:  Medications  LORazepam (ATIVAN) tablet 1-4 mg (has no administration in time range)    Or  LORazepam (ATIVAN) injection 1-4 mg (has no administration in time range)  thiamine tablet 100 mg  (100 mg Oral Given 01/27/20 1640)    Or  thiamine (B-1) injection 100 mg ( Intravenous See Alternative 01/27/20 1640)  folic acid (FOLVITE) tablet 1 mg (1 mg Oral Given 01/27/20 1641)  multivitamin with minerals tablet 1 tablet (1 tablet Oral Given 01/27/20 1641)  enoxaparin (LOVENOX) injection 30 mg (has no administration in time range)  acetaminophen (TYLENOL) tablet 650 mg (has no administration in time range)    Or  acetaminophen (TYLENOL) suppository 650 mg (has no administration in time range)  ondansetron (ZOFRAN) tablet 4 mg (has no administration in time range)    Or  ondansetron (ZOFRAN) injection 4 mg (has no administration in time range)  nicotine (NICODERM CQ - dosed in mg/24 hours) patch 14 mg (has no administration in time range)  hydrALAZINE (APRESOLINE) tablet 25 mg (has no administration in time range)  LORazepam (ATIVAN) injection 1 mg (1 mg Intravenous Given 01/27/20 1544)  sodium chloride 0.9 % bolus 500 mL (0 mLs Intravenous Stopped 01/27/20 1642)  Tdap (BOOSTRIX) injection 0.5 mL (0.5 mLs Intramuscular Given 01/27/20 1641)  sodium chloride 0.9 % bolus 1,000 mL (0 mLs Intravenous Stopped 01/27/20 1743)  LORazepam (ATIVAN) injection 2 mg (2 mg Intravenous Given 01/27/20 1729)     ED Discharge Orders    None       Note:  This document was prepared using Dragon voice recognition software and may include unintentional dictation errors.   Concha Se, MD 01/27/20 [phone]

## 2020-01-27 NOTE — Progress Notes (Signed)
PHARMACIST - PHYSICIAN COMMUNICATION  CONCERNING:  Enoxaparin (Lovenox) for DVT Prophylaxis    RECOMMENDATION: Patient was prescribed enoxaprin 30mg  q24 hours for VTE prophylaxis.   Filed Weights   01/27/20 1448  Weight: 104.3 kg (230 lb)    Body mass index is 32.08 kg/m.  Estimated Creatinine Clearance: 49.3 mL/min (A) (by C-G formula based on SCr of 1.96 mg/dL (H)).  Patient is candidate for enoxaparin 40mg  every 24 hours based on CrCl >38ml/min and weight >57kg for men   DESCRIPTION: Pharmacy has adjusted enoxaparin dose per [facility] policy.  Patient is now receiving enoxaparin 40mg  every 24 hours.  31m, PharmD Clinical Pharmacist  01/27/2020 6:42 PM

## 2020-01-28 ENCOUNTER — Observation Stay: Payer: Medicaid Other

## 2020-01-28 ENCOUNTER — Observation Stay
Admit: 2020-01-28 | Discharge: 2020-01-28 | Disposition: A | Discharge status: Home / Self Care | Payer: Medicaid Other | Attending: Internal Medicine | Admitting: Internal Medicine

## 2020-01-28 DIAGNOSIS — R404 Transient alteration of awareness: Secondary | ICD-10-CM | POA: Diagnosis present

## 2020-01-28 DIAGNOSIS — E118 Type 2 diabetes mellitus with unspecified complications: Secondary | ICD-10-CM | POA: Diagnosis not present

## 2020-01-28 DIAGNOSIS — F10239 Alcohol dependence with withdrawal, unspecified: Secondary | ICD-10-CM | POA: Diagnosis not present

## 2020-01-28 DIAGNOSIS — N179 Acute kidney failure, unspecified: Secondary | ICD-10-CM | POA: Diagnosis not present

## 2020-01-28 DIAGNOSIS — R4189 Other symptoms and signs involving cognitive functions and awareness: Secondary | ICD-10-CM | POA: Diagnosis present

## 2020-01-28 DIAGNOSIS — F10230 Alcohol dependence with withdrawal, uncomplicated: Secondary | ICD-10-CM | POA: Diagnosis not present

## 2020-01-28 DIAGNOSIS — R569 Unspecified convulsions: Secondary | ICD-10-CM | POA: Diagnosis not present

## 2020-01-28 DIAGNOSIS — I10 Essential (primary) hypertension: Secondary | ICD-10-CM | POA: Diagnosis not present

## 2020-01-28 LAB — COMPREHENSIVE METABOLIC PANEL
ALT: 49 U/L — ABNORMAL HIGH (ref 0–44)
AST: 129 U/L — ABNORMAL HIGH (ref 15–41)
Albumin: 3.3 g/dL — ABNORMAL LOW (ref 3.5–5.0)
Alkaline Phosphatase: 65 U/L (ref 38–126)
Anion gap: 10 (ref 5–15)
BUN: 26 mg/dL — ABNORMAL HIGH (ref 6–20)
CO2: 19 mmol/L — ABNORMAL LOW (ref 22–32)
Calcium: 8.1 mg/dL — ABNORMAL LOW (ref 8.9–10.3)
Chloride: 104 mmol/L (ref 98–111)
Creatinine, Ser: 1.52 mg/dL — ABNORMAL HIGH (ref 0.61–1.24)
GFR calc Af Amer: 57 mL/min — ABNORMAL LOW (ref >60)
GFR calc non Af Amer: 49 mL/min — ABNORMAL LOW (ref >60)
Glucose, Bld: 138 mg/dL — ABNORMAL HIGH (ref 70–99)
Potassium: 3.5 mmol/L (ref 3.5–5.1)
Sodium: 133 mmol/L — ABNORMAL LOW (ref 135–145)
Total Bilirubin: 1.6 mg/dL — ABNORMAL HIGH (ref 0.3–1.2)
Total Protein: 7 g/dL (ref 6.5–8.1)

## 2020-01-28 LAB — URINALYSIS, COMPLETE (UACMP) WITH MICROSCOPIC
Bacteria, UA: NONE SEEN
Bilirubin Urine: NEGATIVE
Glucose, UA: NEGATIVE mg/dL
Hgb urine dipstick: NEGATIVE
Ketones, ur: 20 mg/dL — AB
Leukocytes,Ua: NEGATIVE
Nitrite: NEGATIVE
Protein, ur: 100 mg/dL — AB
Specific Gravity, Urine: 1.026 (ref 1.005–1.030)
Squamous Epithelial / LPF: NONE SEEN (ref 0–5)
pH: 5 (ref 5.0–8.0)

## 2020-01-28 LAB — CBC
HCT: 35.7 % — ABNORMAL LOW (ref 39.0–52.0)
HCT: 34.1 % — ABNORMAL LOW (ref 39.0–52.0)
Hemoglobin: 12 g/dL — ABNORMAL LOW (ref 13.0–17.0)
Hemoglobin: 11.3 g/dL — ABNORMAL LOW (ref 13.0–17.0)
MCH: 29.7 pg (ref 26.0–34.0)
MCH: 29.3 pg (ref 26.0–34.0)
MCHC: 33.6 g/dL (ref 30.0–36.0)
MCHC: 33.1 g/dL (ref 30.0–36.0)
MCV: 88.4 fL (ref 80.0–100.0)
MCV: 88.3 fL (ref 80.0–100.0)
Platelets: 83 10*3/uL — ABNORMAL LOW (ref 150–400)
Platelets: 81 10*3/uL — ABNORMAL LOW (ref 150–400)
RBC: 4.04 MIL/uL — ABNORMAL LOW (ref 4.22–5.81)
RBC: 3.86 MIL/uL — ABNORMAL LOW (ref 4.22–5.81)
RDW: 13.9 % (ref 11.5–15.5)
RDW: 14 % (ref 11.5–15.5)
WBC: 4.6 10*3/uL (ref 4.0–10.5)
WBC: 3.8 10*3/uL — ABNORMAL LOW (ref 4.0–10.5)
nRBC: 0 % (ref 0.0–0.2)
nRBC: 0 % (ref 0.0–0.2)

## 2020-01-28 LAB — URINE DRUG SCREEN, QUALITATIVE (ARMC ONLY)
Amphetamines, Ur Screen: NOT DETECTED
Barbiturates, Ur Screen: NOT DETECTED
Benzodiazepine, Ur Scrn: POSITIVE — AB
Cannabinoid 50 Ng, Ur ~~LOC~~: NOT DETECTED
Cocaine Metabolite,Ur ~~LOC~~: NOT DETECTED
MDMA (Ecstasy)Ur Screen: NOT DETECTED
Methadone Scn, Ur: NOT DETECTED
Opiate, Ur Screen: NOT DETECTED
Phencyclidine (PCP) Ur S: NOT DETECTED
Tricyclic, Ur Screen: NOT DETECTED

## 2020-01-28 LAB — ECHOCARDIOGRAM COMPLETE
Height: 71 in
Weight: 3680 oz

## 2020-01-28 LAB — GLUCOSE, CAPILLARY
Glucose-Capillary: 134 mg/dL — ABNORMAL HIGH (ref 70–99)
Glucose-Capillary: 182 mg/dL — ABNORMAL HIGH (ref 70–99)
Glucose-Capillary: 200 mg/dL — ABNORMAL HIGH (ref 70–99)

## 2020-01-28 LAB — HIV ANTIBODY (ROUTINE TESTING W REFLEX): HIV Screen 4th Generation wRfx: NONREACTIVE

## 2020-01-28 LAB — FIBRIN DERIVATIVES D-DIMER (ARMC ONLY): Fibrin derivatives D-dimer (ARMC): 620.27 ng/mL (FEU) — ABNORMAL HIGH (ref 0.00–499.00)

## 2020-01-28 MED ORDER — AMLODIPINE BESYLATE 5 MG PO TABS: 5.0000 mg | ORAL_TABLET | Freq: Every day | ORAL | 0 refills | Status: DC

## 2020-01-28 MED ORDER — THIAMINE HCL 100 MG PO TABS: 100.0000 mg | ORAL_TABLET | Freq: Every day | ORAL | 0 refills | Status: DC

## 2020-01-28 MED ORDER — CARVEDILOL 25 MG PO TABS: 25.0000 mg | ORAL_TABLET | Freq: Two times a day (BID) | ORAL | 1 refills | Status: DC

## 2020-01-28 MED ORDER — LIVING WELL WITH DIABETES BOOK: 1.0000 | Freq: Once | 0 refills | Status: AC

## 2020-01-28 MED ORDER — TAMSULOSIN HCL 0.4 MG PO CAPS: 0.4000 mg | ORAL_CAPSULE | Freq: Every day | ORAL | 0 refills | Status: DC

## 2020-01-28 MED ORDER — ADULT MULTIVITAMIN W/MINERALS CH: 1.0000 | ORAL_TABLET | Freq: Every day | ORAL | 0 refills | Status: DC

## 2020-01-28 MED ORDER — METFORMIN HCL 1000 MG PO TABS: 1000.0000 mg | ORAL_TABLET | Freq: Every day | ORAL | 11 refills | Status: DC

## 2020-01-28 MED ORDER — FOLIC ACID 1 MG PO TABS: 1.0000 mg | ORAL_TABLET | Freq: Every day | ORAL | 0 refills | Status: DC

## 2020-01-28 MED ORDER — ATORVASTATIN CALCIUM 20 MG PO TABS: 20.0000 mg | ORAL_TABLET | Freq: Every day | ORAL | 0 refills | Status: DC

## 2020-01-28 MED ORDER — BLOOD GLUCOSE MONITOR KIT: PACK | 0 refills | Status: DC

## 2020-01-28 MED ORDER — HYDRALAZINE HCL 25 MG PO TABS: 25.0000 mg | ORAL_TABLET | Freq: Four times a day (QID) | ORAL | 0 refills | Status: DC

## 2020-01-28 MED ORDER — LIVING WELL WITH DIABETES BOOK
Freq: Once | Status: AC
  Filled 2020-01-28 (×2): qty 1

## 2020-01-28 NOTE — Progress Notes (Signed)
OT Cancellation Note  Patient Details Name: Brad Graham MRN: [id number] DOB: 12/05/1960   Cancelled Treatment:    Reason Eval/Treat Not Completed: Patient at procedure or test/ unavailable. Thank you for the OT consult. Order received and chart reviewed. Upon arrival to unit. Pt primary RN informs this Thereasa Parkin pt is currently off the floor for ultrasound. Will re-attempt as available and pt medically appropriate for OT eval.   Rockney Ghee, M.S., OTR/L Ascom: [phone] 01/28/20, 11:24 AM

## 2020-01-28 NOTE — Evaluation (Signed)
Physical Therapy Evaluation Patient Details Name: Brad Graham MRN: [id number] DOB: 04-19-1961 Today's Date: 01/28/2020   History of Present Illness  Brad Graham is a 59 y.o. male who presented to [facility] ED via EMS after being found down in his room at his group home. PMH includes: h/o CVA, HLD, HTN, alcohol abuse. At baseline pt performs mostly household distance AMB with SPC.  Clinical Impression  Pt admitted with above diagnosis. Pt currently with functional limitations due to the deficits listed below (see "PT Problem List"). Upon entry, pt in bed, awake and agreeable to participate. Pt's lead set is unplugged with only the Green lead clipped in place- RN made aware. Pt has poor safety awareness, nearly yanking on his condom cath and IV several times in session. Heavy effort for all mobility, but no physical assist needed. Pt AMB 166ft with SPC, appears to have minimal unsteadiness but not dependent on [facility] for balance 100% of gait cycle. Pt has large amplitude trunk sway compensation I suspect is chronic, as it is not surprising to patient and appears well developed. Pt reports his gait to be near baseline and his general mobility to be no more than 25% impaired compared to his baseline. Pt needs a new rubber foot on SPC, as current is completely worn through which limits safety provoking capacity of current device. Functional mobility assessment demonstrates increased effort/time requirements, poor tolerance, and need for physical assistance, whereas the patient performed these at a higher level of independence PTA. Pt will benefit from skilled PT intervention to increase independence and safety with basic mobility in preparation for discharge to the venue listed below.       Follow Up Recommendations Home health PT;Supervision - Intermittent    Equipment Recommendations  None recommended by PT    Recommendations for Other Services       Precautions / Restrictions  Precautions Precautions: Fall;Other (comment)(seizure) Restrictions Weight Bearing Restrictions: No      Mobility  Bed Mobility Overal bed mobility: Needs Assistance Bed Mobility: Supine to Sit;Sit to Supine     Supine to sit: Supervision Sit to supine: Supervision   General bed mobility comments: poor awareness of lines and leads.  Transfers Overall transfer level: Needs assistance Equipment used: Straight cane Transfers: Sit to/from Stand Sit to Stand: Min guard         General transfer comment: heavy effort, mild ataxia (suspected to be chronic), no LOB  Ambulation/Gait Ambulation/Gait assistance: Min guard Gait Distance (Feet): 140 Feet Assistive device: Straight cane Gait Pattern/deviations: Ataxic     General Gait Details: Pt reports quality and effort are near baseline.  Stairs            Wheelchair Mobility    Modified Rankin (Stroke Patients Only)       Balance Overall balance assessment: History of Falls;Mild deficits observed, not formally tested;Modified Independent                                           Pertinent Vitals/Pain Pain Assessment: No/denies pain(Left knee pain from fall, 'feeling much better')    Home Living Family/patient expects to be discharged to:: Group home(pt says he lives in a boarding house) Living Arrangements: Group Home                    Prior Function Level of Independence: Independent with assistive device(s)  Comments: SPC for AMB, mostly in home     Hand Dominance        Extremity/Trunk Assessment   Upper Extremity Assessment Upper Extremity Assessment: Generalized weakness;Overall [facility] for tasks assessed    Lower Extremity Assessment Lower Extremity Assessment: Generalized weakness;Overall [facility] for tasks assessed    Cervical / Trunk Assessment Cervical / Trunk Assessment: Normal  Communication      Cognition Arousal/Alertness: Awake/alert Behavior  During Therapy: WFL for tasks assessed/performed Overall Cognitive Status: Within Functional Limits for tasks assessed                                        General Comments      Exercises     Assessment/Plan    PT Assessment Patient needs continued PT services  PT Problem List Decreased strength;Decreased activity tolerance;Decreased balance;Decreased mobility       PT Treatment Interventions Gait training;DME instruction;Balance training;Functional mobility training;Stair training;Therapeutic activities;Therapeutic exercise;Patient/family education    PT Goals (Current goals can be found in the Care Plan section)  Acute Rehab PT Goals Patient Stated Goal: Regain strength and have less painfl Left knee PT Goal Formulation: With patient Time For Goal Achievement: 02/11/20 Potential to Achieve Goals: Good    Frequency Min 2X/week   Barriers to discharge        Co-evaluation               AM-PAC PT "6 Clicks" Mobility  Outcome Measure Help needed turning from your back to your side while in a flat bed without using bedrails?: A Little Help needed moving from lying on your back to sitting on the side of a flat bed without using bedrails?: A Little Help needed moving to and from a bed to a chair (including a wheelchair)?: A Little Help needed standing up from a chair using your arms (e.g., wheelchair or bedside chair)?: A Little Help needed to walk in hospital room?: A Little Help needed climbing 3-5 steps with a railing? : A Little 6 Click Score: 18    End of Session Equipment Utilized During Treatment: Gait belt Activity Tolerance: Patient tolerated treatment well Patient left: in bed;with call bell/phone within reach;with nursing/sitter in room Nurse Communication: Mobility status PT Visit Diagnosis: Unsteadiness on feet (R26.81);Difficulty in walking, not elsewhere classified (R26.2);Other abnormalities of gait and mobility (R26.89)    Time:  0272-5366 PT Time Calculation (min) (ACUTE ONLY): 13 min   Charges:   PT Evaluation $PT Eval Low Complexity: 1 Low          12:25 PM, 01/28/20 Rosamaria Lints, PT, DPT Physical Therapist - [facility]  [phone] (ASCOM)    Ariah Mower C 01/28/2020, 12:16 PM

## 2020-01-28 NOTE — TOC Initial Note (Signed)
Transition of Care [facility]) - Initial/Assessment Note    Patient Details  Name: Brad Graham MRN: [id number] Date of Birth: 07-17-1961  Transition of Care [facility]) CM/SW Contact:    Chapman Fitch, RN Phone Number: 01/28/2020, 2:52 PM  Clinical Narrative:                 Patient admitted from home with syncope and collapse Patient states that he has lived at a boarding house on [address] for about a year, and will return at discharge  Uncle is at the bedside and will be providing transportation.  Patient states that normally his uncle will take him where he needs to go, or "I get a friend to take me"  PCP [facility] [employer] - denies issues obtaining medications  PT and OT have evaluated patient and recommends home health.  Patient states he does not feel that it is indicated at this time.  Patient states that he has a single prong cane that he uses for ambulation  Patient does state that he is a daily drinker, declines any substance abuse resources   Expected Discharge Plan: Home/Self Care Barriers to Discharge: No Barriers Identified   Patient Goals and CMS Choice Patient states their goals for this hospitalization and ongoing recovery are:: to go home      Expected Discharge Plan and Services Expected Discharge Plan: Home/Self Care       Living arrangements for the past 2 months: Boarding House Expected Discharge Date: 01/28/20                         HH Arranged: Refused HH          Prior Living Arrangements/Services Living arrangements for the past 2 months: Boarding House   Patient language and need for interpreter reviewed:: Yes Do you feel safe going back to the place where you live?: Yes      Need for Family Participation in Patient Care: Yes (Comment) Care giver support system in place?: Yes (comment) Current home services: DME Criminal Activity/Legal Involvement Pertinent to Current Situation/Hospitalization: No - Comment as  needed  Activities of Daily Living Home Assistive Devices/Equipment: Cane (specify quad or straight) ADL Screening (condition at time of admission) Patient's cognitive ability adequate to safely complete daily activities?: Yes Is the patient deaf or have difficulty hearing?: No Does the patient have difficulty seeing, even when wearing glasses/contacts?: No Does the patient have difficulty concentrating, remembering, or making decisions?: Yes Patient able to express need for assistance with ADLs?: Yes Does the patient have difficulty dressing or bathing?: No Independently performs ADLs?: Yes (appropriate for developmental age) Does the patient have difficulty walking or climbing stairs?: Yes Weakness of Legs: Both Weakness of Arms/Hands: Both  Permission Sought/Granted                  Emotional Assessment       Orientation: : Oriented to Self, Oriented to Place, Oriented to  Time, Oriented to Situation   Psych Involvement: No (comment)  Admission diagnosis:  Syncope and collapse [R55] Acute renal insufficiency [N28.9] AKI (acute kidney injury) (HCC) [N17.9] Alcohol withdrawal seizure without complication (HCC) [F10.230, R56.9] Unresponsive episode [R41.89] Patient Active Problem List   Diagnosis Date Noted  . Unresponsive episode 01/28/2020  . Syncope and collapse 01/27/2020  . Seizure (HCC) 08/02/2016  . Syncope 07/02/2016  . Essential hypertension 05/10/2015  . Hyperglycemia 05/10/2015  . Hyperlipidemia 05/10/2015  . Proteinuria 05/10/2015  PCP:  Patient, No Pcp Per Pharmacy:   [place], [place] - [address] [place] [address] Phone: [phone] Fax: [phone]     Social Determinants of Health (SDOH) Interventions    Readmission Risk Interventions No flowsheet data found.

## 2020-01-28 NOTE — Progress Notes (Addendum)
Inpatient Diabetes Program Recommendations  AACE/ADA: New Consensus Statement on Inpatient Glycemic Control   Target Ranges:  Prepandial:   less than 140 mg/dL      Peak postprandial:   less than 180 mg/dL (1-2 hours)      Critically ill patients:  140 - 180 mg/dL  Results for CLAUDY, ABDALLAH (MRN [id number]) as of 01/28/2020 09:36  Ref. Range 01/27/2020 22:12 01/28/2020 07:56  Glucose-Capillary Latest Ref Range: 70 - 99 mg/dL 053 (H) 976 (H)   Results for CORLISS, LAMARTINA (MRN [id number]) as of 01/28/2020 09:36  Ref. Range 01/27/2020 14:54 01/27/2020 17:36 01/28/2020 05:14  Glucose Latest Ref Range: 70 - 99 mg/dL 240 (H)  973 (H)  Hemoglobin A1C Latest Ref Range: 4.8 - 5.6 %  7.2 (H)    Review of Glycemic Control  Diabetes history: No Outpatient Diabetes medications: NA Current orders for Inpatient glycemic control: Novolog 0-6 units TID with meals  Inpatient Diabetes Program Recommendations:   HbgA1C: A1C 7.2% on 01/27/20 indicating an average glucose of 160 mg/dl over the past 2-3 months.   NOTE: Noted consult for new DM dx. Ordered Living Well with DM book and RD consult. Will plan to see patient today.  Addendum 01/28/20@11 :20-Went by to talk with patient regarding new DM dx but patient was off unit at that time.  01/28/20@16 :32-Talked with patient over the phone about new DM dx. Patient states that does not have any DM hx. Patient reports that he sees his PCP but has not seen PCP since August 2020. Patient reports that he lives in boarding house and that he takes his own medications. Patient states that he has not been told he had DM by anyone at the hospital. Explained that initial glucose was elevated at 238 mg/dl and so another lab called an A1C was checked and it was elevated at 7.2% on 01/27/20 so he is being newly dx with DM2.  Discussed A1C results (7.2%) and explained what an A1C is and informed patient that his current A1C indicates an average glucose of 160 mg/dl over the past 2-3 months.  Discussed basic pathophysiology of DM Type 2, basic home care, importance of checking CBGs and maintaining good CBG control to prevent long-term and short-term complications. Reviewed glucose and A1C goals.  Reviewed signs and symptoms of hyperglycemia and hypoglycemia along with treatment for both. Discussed impact of nutrition, exercise, stress, sickness, and medications on diabetes control. Informed patient that he should be getting a Living Well with diabetes booklet and encouraged patient to read through entire book. Informed patient that he will be prescribed Metformin 1000 mg daily. Discussed Metformin and potential side effect of GI upset. Asked patient to call PCP and make an appointment for next week to follow up with PCP regarding new DM dx. Asked patient to take medications as prescribed and be sure to follow up with PCP.   Patient verbalized understanding of information discussed and he states that he has no further questions at this time related to diabetes.   Talked with Selena Batten, RN and informed her that I have spoken with patient and to be sure patient receives Living Well with DM book before being discharged home today.    Thanks, Orlando Penner, RN, MSN, CDE Diabetes Coordinator Inpatient Diabetes Program [phone] (Team Pager from 8am to 5pm)

## 2020-01-28 NOTE — Discharge Summary (Addendum)
Physician Discharge Summary  SUSIE POUSSON WCB:[id number] DOB: 02/28/1961 DOA: 01/27/2020  PCP: Patient, No Pcp Per  Admit date: 01/27/2020 Discharge date: 01/28/2020  Recommendations for Outpatient Follow-up:  1. Follow up with PCP in 7-10 days. 2. Chemistry checked at that visit. 3. Check glucose one daily and take those values in when you see PCP. 4. Seek help to stop alcohol use with outpatient rehab.  Discharge Diagnoses: Principal diagnosis is #1 1. Unresponsive episode 2. ETOH withdrawal seizure with post ictal period 3. DM II: New Dx.  4. Hypertension 5. AKI 6. ETOH dependence 7. Hyperlipidemia  Discharge Condition: Fair  Disposition: Home to group home  Diet recommendation: Heart healthy/carbohydrate modified  Filed Weights   01/27/20 1448  Weight: 104.3 kg   History of present illness:  The patient is a 59 yr old man who carries a past medical history significant for alcohol withdrawal seizures, arthritis, CVA, hyperlipidemia, hypertension. He is a resident of a group home. The patient states that he can remember waking up this morning, but nothing since then until he EMS arrived. Apparently the patient was confused when they arrived, but was oriented once he was evaluated in [facility] ED.  The patient states that he drinks at least 1/2 of 1/5 each evening. He states that he has been in his usual state of health.   He denies fevers, chills, cough (although he coughs 3 times while I am in the room), shortness of breath, chest pain, nausea, vomiting, constipation, diarrhea. He denies wounds, sores, swelling, or rashes. He states that he has no trouble starting a stream of urine, although he states that he is urinating more frequently lately.  Hospital Course:  The patient was admitted to a telemetry bed on a CIWA protocol. CT head was negative for infarct or acute abnormality. The patient refused MRI. Echocardiogram did not demonstrate intracardiac source of thrombus.  Carotid doppler demonstrated less than 50% stenosis bilaterally with mild atherosclerotic diseases. He has a HbA1c of 7.2. Diabetic counseling was ordered and the patient has been started on metformin. The patient also was found to be in AKI with a creatinine of 1.98 on admission. Improved to 1.54 today. The patient's lisinopril was discontinued, and the patient was started on QID hydralazine for his hypertension. The patient was evaluated by neurology who felt that the patient's episode of unresponsiveness was due to alcohol withdrawal. The patient will be discharged to home in fair condition.  Today's assessment: S: The patient is resting comfortably. No new complaints. O: Vitals:  Vitals:   01/28/20 0548 01/28/20 0850  BP: (!) 154/98 (!) 150/84  Pulse: 85 99  Resp: (!) 22 (!) 24  Temp: 98.1 F (36.7 C) 98.2 F (36.8 C)  SpO2: 98% 99%   Exam:  Constitutional:   The patient is awake, alert, and oriented x 3. No acute distress. Respiratory:   No increased work of breathing.  No wheezes, rales, or rhonchi  No tactile fremitus Cardiovascular:   Regular rate and rhythm  No murmurs, ectopy, or gallups.  No lateral PMI. No thrills. Abdomen:   Abdomen is soft, non-tender, non-distended  No hernias, masses, or organomegaly  Normoactive bowel sounds.  Musculoskeletal:   No cyanosis, clubbing, or edema Skin:   No rashes, lesions, ulcers  palpation of skin: no induration or nodules Neurologic:   CN 2-12 intact  Sensation all 4 extremities intact Psychiatric:   Mental status o Mood, affect appropriate o Orientation to person, place, time   judgment and  insight appear intact  Discharge Instructions  Discharge Instructions    Activity as tolerated - No restrictions   Complete by: As directed    Call MD for:  difficulty breathing, headache or visual disturbances   Complete by: As directed    Call MD for:  persistant nausea and vomiting   Complete by: As  directed    Call MD for:  severe uncontrolled pain   Complete by: As directed    Call MD for:  temperature >100.4   Complete by: As directed    Diet - low sodium heart healthy   Complete by: As directed    Diet Carb Modified   Complete by: As directed    Discharge instructions   Complete by: As directed    Follow up with PCP in 7-10 days.   Increase activity slowly   Complete by: As directed      Allergies as of 01/28/2020   No Known Allergies     Medication List    TAKE these medications   amLODipine 5 MG tablet Commonly known as: NORVASC Take 1 tablet (5 mg total) by mouth daily. Start taking on: Jan 29, 2020   atorvastatin 20 MG tablet Commonly known as: LIPITOR Take 1 tablet (20 mg total) by mouth daily at 6 PM.   carvedilol 25 MG tablet Commonly known as: COREG Take 1 tablet (25 mg total) by mouth 2 (two) times daily with a meal.   folic acid 1 MG tablet Commonly known as: FOLVITE Take 1 tablet (1 mg total) by mouth daily. Start taking on: Jan 29, 2020   hydrALAZINE 25 MG tablet Commonly known as: APRESOLINE Take 1 tablet (25 mg total) by mouth every 6 (six) hours.   living well with diabetes book Misc 1 each by Does not apply route once for 1 dose.   metFORMIN 1000 MG tablet Commonly known as: Glucophage Take 1 tablet (1,000 mg total) by mouth daily with breakfast.   multivitamin with minerals Tabs tablet Take 1 tablet by mouth daily. Start taking on: Jan 29, 2020   tamsulosin 0.4 MG Caps capsule Commonly known as: FLOMAX Take 1 capsule (0.4 mg total) by mouth daily after supper. What changed: See the new instructions.   thiamine 100 MG tablet Take 1 tablet (100 mg total) by mouth daily. Start taking on: Jan 29, 2020     Glucometer kit. No Known Allergies  The results of significant diagnostics from this hospitalization (including imaging, microbiology, ancillary and laboratory) are listed below for reference.    Significant Diagnostic  Studies: CT Head Wo Contrast  Result Date: 01/27/2020 CLINICAL DATA:  Head trauma, headache Found down, unwitnessed fall. EXAM: CT HEAD WITHOUT CONTRAST TECHNIQUE: Contiguous axial images were obtained from the base of the skull through the vertex without intravenous contrast. COMPARISON:  Head CT 08/02/2016 FINDINGS: Brain: No intracranial hemorrhage, mass effect, or midline shift. Mild to moderate chronic small vessel ischemia. Remote lacunar infarct in right basal ganglia, unchanged. No hydrocephalus. The basilar cisterns are patent. No evidence of territorial infarct or acute ischemia. No extra-axial or intracranial fluid collection. Vascular: Atherosclerosis of skullbase vasculature without hyperdense vessel or abnormal calcification. Skull: No fracture or focal lesion. Sinuses/Orbits: Mild retained secretions in right side of sphenoid sinus. Paranasal sinuses are otherwise clear. Included orbits are unremarkable. Other: None. IMPRESSION: 1. No acute intracranial abnormality. No skull fracture. 2. Unchanged chronic small vessel ischemia and remote lacunar infarct in the right basal ganglia. Electronically Signed   By:  Keith Rake M.D.   On: 01/27/2020 15:53   CT Cervical Spine Wo Contrast  Result Date: 01/27/2020 CLINICAL DATA:  Neck trauma, uncomplicated (NEXUS/CCR neg) (Age [id number]) Patient found down, unwitnessed fall. EXAM: CT CERVICAL SPINE WITHOUT CONTRAST TECHNIQUE: Multidetector CT imaging of the cervical spine was performed without intravenous contrast. Multiplanar CT image reconstructions were also generated. COMPARISON:  Cervical spine CT 08/02/2016 FINDINGS: Alignment: Straightening of normal lordosis, unchanged from prior exam. No traumatic subluxation. Skull base and vertebrae: No acute fracture. Vertebral body heights are maintained. The dens and skull base are intact. Soft tissues and spinal canal: No prevertebral fluid or swelling. No visible canal hematoma. Disc levels: Degenerative  disc disease at C5-C6 and C6-C7, similar to prior. There is scattered facet hypertrophy. Upper chest: No acute findings. Other: None. IMPRESSION: Degenerative change in the cervical spine without acute fracture or subluxation. Electronically Signed   By: Keith Rake M.D.   On: 01/27/2020 15:57   US RENAL  Result Date: 01/28/2020 CLINICAL DATA:  Acute renal insufficiency. EXAM: RENAL / URINARY TRACT ULTRASOUND COMPLETE COMPARISON:  None. FINDINGS: Right Kidney: Renal measurements: 10.4 x 5.4 x 5.6 cm = volume: 164 mL . Echogenicity within normal limits. No mass or hydronephrosis visualized. Left Kidney: Renal measurements: 10.7 x 5.9 x 4.6 cm = volume: 150 mL. Echogenicity within normal limits. No mass or hydronephrosis visualized. Bladder: Appears normal for degree of bladder distention. Other: None. IMPRESSION: Normal renal ultrasound. Electronically Signed   By: Marijo Conception M.D.   On: 01/28/2020 11:36   US Carotid Bilateral  Result Date: 01/28/2020 CLINICAL DATA:  Stroke-like symptoms. EXAM: BILATERAL CAROTID DUPLEX ULTRASOUND TECHNIQUE: Pearline Cables scale imaging, color Doppler and duplex ultrasound were performed of bilateral carotid and vertebral arteries in the neck. COMPARISON:  07/02/2016 FINDINGS: Criteria: Quantification of carotid stenosis is based on velocity parameters that correlate the residual internal carotid diameter with NASCET-based stenosis levels, using the diameter of the distal internal carotid lumen as the denominator for stenosis measurement. The following velocity measurements were obtained: RIGHT ICA: 59/18 cm/sec CCA: 81/19 cm/sec SYSTOLIC ICA/CCA RATIO:  0.7 ECA: 66 cm/sec LEFT ICA: 88/29 cm/sec CCA: 14/78 cm/sec SYSTOLIC ICA/CCA RATIO:  0.9 ECA: 71 cm/sec RIGHT CAROTID ARTERY: Echogenic plaque at the right carotid bulb. External carotid artery is patent with normal waveform. Normal waveforms and velocities in the internal carotid artery. RIGHT VERTEBRAL ARTERY: Antegrade flow  and normal waveform in the right vertebral artery. LEFT CAROTID ARTERY: Small amount of echogenic and heterogeneous plaque at the left carotid bulb. External carotid artery is patent with normal waveform. Normal waveforms and velocities in the internal carotid artery. LEFT VERTEBRAL ARTERY: Antegrade flow and normal waveform in the left vertebral artery. 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 Estimated degree of stenosis in the internal carotid arteries is less than 50% bilaterally. Patent vertebral arteries with antegrade flow. Electronically Signed   By: Markus Daft M.D.   On: 01/28/2020 11:34   ECHOCARDIOGRAM COMPLETE  Result Date: 01/28/2020    ECHOCARDIOGRAM REPORT   Patient Name:   HENRRY FEIL Date of Exam: 01/28/2020 Medical Rec #:  [id number]        Height:       71.0 in Accession #:    [id number]       Weight:       230.0 lb Date of Birth:  August 07, 1961         BSA:          2.238  m Patient Age:    59 years         BP:           150/84 mmHg Patient Gender: M                HR:           99 bpm. Exam Location:  [facility] Procedure: 2D Echo, Cardiac Doppler and Color Doppler Indications:     Syncope 780.2  History:         Patient has prior history of Echocardiogram examinations, most                  recent 07/03/2016. Risk Factors:Hypertension. Alcohol abuse,                  history of CVA.  Sonographer:     Sherrie Sport RDCS (AE) Referring Phys:  [id number] Karie Kirks Diagnosing Phys: Serafina Royals MD IMPRESSIONS  1. Left ventricular ejection fraction, by estimation, is 50 to 55%. The left ventricle has low normal function. The left ventricle has no regional wall motion abnormalities. Left ventricular diastolic parameters were normal.  2. Right ventricular systolic function is normal. The right ventricular size is normal. There is normal pulmonary artery systolic pressure.  3. The mitral valve is normal in structure. Trivial mitral valve regurgitation.  4. The aortic valve is normal in  structure. Aortic valve regurgitation is not visualized. FINDINGS  Left Ventricle: Left ventricular ejection fraction, by estimation, is 50 to 55%. The left ventricle has low normal function. The left ventricle has no regional wall motion abnormalities. The left ventricular internal cavity size was normal in size. There is no left ventricular hypertrophy. Left ventricular diastolic parameters were normal. Right Ventricle: The right ventricular size is normal. No increase in right ventricular wall thickness. Right ventricular systolic function is normal. There is normal pulmonary artery systolic pressure. The tricuspid regurgitant velocity is 1.86 m/s, and  with an assumed right atrial pressure of 10 mmHg, the estimated right ventricular systolic pressure is 25.3 mmHg. Left Atrium: Left atrial size was normal in size. Right Atrium: Right atrial size was normal in size. Pericardium: There is no evidence of pericardial effusion. Mitral Valve: The mitral valve is normal in structure. Trivial mitral valve regurgitation. Tricuspid Valve: The tricuspid valve is normal in structure. Tricuspid valve regurgitation is trivial. Aortic Valve: The aortic valve is normal in structure. Aortic valve regurgitation is not visualized. Aortic valve mean gradient measures 2.0 mmHg. Aortic valve peak gradient measures 4.3 mmHg. Aortic valve area, by VTI measures 2.85 cm. Pulmonic Valve: The pulmonic valve was normal in structure. Pulmonic valve regurgitation is not visualized. Aorta: The aortic root and ascending aorta are structurally normal, with no evidence of dilitation. IAS/Shunts: No atrial level shunt detected by color flow Doppler.  LEFT VENTRICLE PLAX 2D LVIDd:         3.79 cm  Diastology LVIDs:         2.13 cm  LV e' lateral:   6.31 cm/s LV PW:         1.14 cm  LV E/e' lateral: 10.2 LV IVS:        1.48 cm  LV e' medial:    5.87 cm/s LVOT diam:     2.10 cm  LV E/e' medial:  11.0 LV SV:         43 LV SV Index:   19 LVOT Area:      3.46 cm  RIGHT VENTRICLE  RV Basal diam:  3.66 cm RV S prime:     15.00 cm/s TAPSE (M-mode): 3.3 cm LEFT ATRIUM             Index       RIGHT ATRIUM           Index LA diam:        2.90 cm 1.30 cm/m  RA Area:     12.10 cm LA Vol (A2C):   35.4 ml 15.82 ml/m RA Volume:   26.90 ml  12.02 ml/m LA Vol (A4C):   37.9 ml 16.94 ml/m LA Biplane Vol: 37.1 ml 16.58 ml/m  AORTIC VALVE                   PULMONIC VALVE AV Area (Vmax):    2.48 cm    PV Vmax:        0.87 m/s AV Area (Vmean):   2.53 cm    PV Peak grad:   3.0 mmHg AV Area (VTI):     2.85 cm    RVOT Peak grad: 2 mmHg AV Vmax:           103.30 cm/s AV Vmean:          66.750 cm/s AV VTI:            0.150 m AV Peak Grad:      4.3 mmHg AV Mean Grad:      2.0 mmHg LVOT Vmax:         73.90 cm/s LVOT Vmean:        48.800 cm/s LVOT VTI:          0.124 m LVOT/AV VTI ratio: 0.82  AORTA Ao Root diam: 3.50 cm MITRAL VALVE               TRICUSPID VALVE MV Area (PHT): 5.02 cm    TR Peak grad:   13.8 mmHg MV Decel Time: 151 msec    TR Vmax:        186.00 cm/s MV E velocity: 64.50 cm/s MV A velocity: 98.50 cm/s  SHUNTS MV E/A ratio:  0.65        Systemic VTI:  0.12 m                            Systemic Diam: 2.10 cm Serafina Royals MD Electronically signed by Serafina Royals MD Signature Date/Time: 01/28/2020/1:40:41 PM    Final     Microbiology: Recent Results (from the past 240 hour(s))  Respiratory Panel by RT PCR (Flu A&B, Covid) - Nasopharyngeal Swab     Status: None   Collection Time: 01/27/20  4:38 PM   Specimen: Nasopharyngeal Swab  Result Value Ref Range Status   SARS Coronavirus 2 by RT PCR NEGATIVE NEGATIVE Final    Comment: (NOTE) SARS-CoV-2 target nucleic acids are NOT DETECTED. The SARS-CoV-2 RNA is generally detectable in upper respiratoy specimens during the acute phase of infection. The lowest concentration of SARS-CoV-2 viral copies this assay can detect is 131 copies/mL. A negative result does not preclude SARS-Cov-2 infection and should not be  used as the sole basis for treatment or other patient management decisions. A negative result may occur with  improper specimen collection/handling, submission of specimen other than nasopharyngeal swab, presence of viral mutation(s) within the areas targeted by this assay, and inadequate number of viral copies (<131 copies/mL). A negative result must be combined with clinical observations, patient history,  and epidemiological information. The expected result is Negative. Fact Sheet for Patients:  PinkCheek.be Fact Sheet for Healthcare Providers:  GravelBags.it This test is not yet ap proved or cleared by the [place] FDA and  has been authorized for detection and/or diagnosis of SARS-CoV-2 by FDA under an Emergency Use Authorization (EUA). This EUA will remain  in effect (meaning this test can be used) for the duration of the COVID-19 declaration under Section 564(b)(1) of the Act, 21 U.S.C. section 360bbb-3(b)(1), unless the authorization is terminated or revoked sooner.    Influenza A by PCR NEGATIVE NEGATIVE Final   Influenza B by PCR NEGATIVE NEGATIVE Final    Comment: (NOTE) The Xpert Xpress SARS-CoV-2/FLU/RSV assay is intended as an aid in  the diagnosis of influenza from Nasopharyngeal swab specimens and  should not be used as a sole basis for treatment. Nasal washings and  aspirates are unacceptable for Xpert Xpress SARS-CoV-2/FLU/RSV  testing. Fact Sheet for Patients: PinkCheek.be Fact Sheet for Healthcare Providers: GravelBags.it This test is not yet approved or cleared by the [place] FDA and  has been authorized for detection and/or diagnosis of SARS-CoV-2 by  FDA under an Emergency Use Authorization (EUA). This EUA will remain  in effect (meaning this test can be used) for the duration of the  Covid-19 declaration under Section 564(b)(1) of the  Act, 21  U.S.C. section 360bbb-3(b)(1), unless the authorization is  terminated or revoked. Performed at [facility], [address] [place], [place] [address]      Labs: Basic Metabolic Panel: Recent Labs  Lab 01/27/20 1454 01/28/20 0514  NA 132* 133*  K 4.0 3.5  CL 100 104  CO2 15* 19*  GLUCOSE 238* 138*  BUN 26* 26*  CREATININE 1.96* 1.52*  CALCIUM 8.8* 8.1*   Liver Function Tests: Recent Labs  Lab 01/27/20 1454 01/28/20 0514  AST 114* 129*  ALT 49* 49*  ALKPHOS 79 65  BILITOT 1.0 1.6*  PROT 8.3* 7.0  ALBUMIN 4.0 3.3*   No results for input(s): LIPASE, AMYLASE in the last 168 hours. No results for input(s): AMMONIA in the last 168 hours. CBC: Recent Labs  Lab 01/27/20 1454 01/27/20 2304 01/28/20 0514  WBC 5.0 4.6 3.8*  NEUTROABS 3.9  --   --   HGB 13.2 12.0* 11.3*  HCT 39.4 35.7* 34.1*  MCV 87.8 88.4 88.3  PLT PLATELET CLUMPS NOTED ON SMEAR, UNABLE TO ESTIMATE 83* 81*   Cardiac Enzymes: Recent Labs  Lab 01/27/20 1532  CKTOTAL 234   BNP: BNP (last 3 results) No results for input(s): BNP in the last 8760 hours.  ProBNP (last 3 results) No results for input(s): PROBNP in the last 8760 hours.  CBG: Recent Labs  Lab 01/27/20 2212 01/28/20 0756 01/28/20 1309  GLUCAP 172* 134* 182*    Active Problems:   Syncope and collapse   Unresponsive episode   Time coordinating discharge: 38 minutes  Signed:        Ormond Lazo, DO [facility]  01/28/2020, 2:14 PM

## 2020-01-28 NOTE — Consult Note (Signed)
Reason for Consult: syncope  Requesting Physician: Dr. Gerri Lins   CC: syncope   HPI: Brad Graham is an 59 y.o. male past medical history significant for alcohol withdrawal seizures, arthritis, CVA, hyperlipidemia, hypertension. He is a resident of a group home. The patient states that he can remember waking up this morning, but nothing since then until he EMS arrived. Apparently the patient was confused when they arrived, but was oriented once he was evaluated in [facility] ED. Significant daily drinking  at least 1/2 of 1/5 each evening. He states that he has been in his usual state of health.  CTH no acute abnormalities    Past Medical History:  Diagnosis Date  . Alcohol abuse   . Arthritis   . H/O: CVA (cerebrovascular accident)   . Hyperlipidemia   . Hypertension     Past Surgical History:  Procedure Laterality Date  . none      Family History  Problem Relation Age of Onset  . Diabetes Mother   . Stroke Mother   . Heart disease Maternal Grandmother   . Heart disease Maternal Grandfather     Social History:  reports that he has been smoking cigarettes. He has been smoking about 0.25 packs per day. He has never used smokeless tobacco. He reports current alcohol use. He reports that he does not use drugs.  No Known Allergies  Medications: I have reviewed the patient's current medications.  ROS:    History obtained from the patient  General ROS: negative for - chills, fatigue, fever, night sweats, weight gain or weight loss Psychological ROS: negative for - behavioral disorder, hallucinations, memory difficulties, mood swings or suicidal ideation Ophthalmic ROS: negative for - blurry vision, double vision, eye pain or loss of vision ENT ROS: negative for - epistaxis, nasal discharge, oral lesions, sore throat, tinnitus or vertigo Allergy and Immunology ROS: negative for - hives or itchy/watery eyes Hematological and Lymphatic ROS: negative for - bleeding problems,  bruising or swollen lymph nodes Endocrine ROS: negative for - galactorrhea, hair pattern changes, polydipsia/polyuria or temperature intolerance Respiratory ROS: negative for - cough, hemoptysis, shortness of breath or wheezing Cardiovascular ROS: negative for - chest pain, dyspnea on exertion, edema or irregular heartbeat Gastrointestinal ROS: negative for - abdominal pain, diarrhea, hematemesis, nausea/vomiting or stool incontinence Genito-Urinary ROS: negative for - dysuria, hematuria, incontinence or urinary frequency/urgency Musculoskeletal ROS: negative for - joint swelling or muscular weakness Neurological ROS: as noted in HPI Dermatological ROS: negative for rash and skin lesion changes   Physical Examination: Blood pressure (!) 150/84, pulse 99, temperature 98.2 F (36.8 C), temperature source Oral, resp. rate (!) 24, height 5\' 11"  (1.803 m), weight 104.3 kg, SpO2 99 %.   Neurological Examination   Mental Status: Alert, oriented, thought content appropriate.  Speech fluent without evidence of aphasia.  Able to follow 3 step commands without difficulty. Cranial Nerves: II: Discs flat bilaterally; Visual fields grossly normal, pupils equal, round, reactive to light and accommodation III,IV, VI: ptosis not present, extra-ocular motions intact bilaterally V,VII: smile symmetric, facial light touch sensation normal bilaterally VIII: hearing normal bilaterally Motor: Right : Upper extremity   5/5    Left:     Upper extremity   5/5  Lower extremity   5/5     Lower extremity   5/5 Tone and bulk:normal tone throughout; no atrophy noted Sensory: Pinprick and light touch intact throughout, bilaterally Deep Tendon Reflexes: 1+ and symmetric throughout    Laboratory Studies:   Basic  Metabolic Panel: Recent Labs  Lab 01/27/20 1454 01/28/20 0514  NA 132* 133*  K 4.0 3.5  CL 100 104  CO2 15* 19*  GLUCOSE 238* 138*  BUN 26* 26*  CREATININE 1.96* 1.52*  CALCIUM 8.8* 8.1*     Liver Function Tests: Recent Labs  Lab 01/27/20 1454 01/28/20 0514  AST 114* 129*  ALT 49* 49*  ALKPHOS 79 65  BILITOT 1.0 1.6*  PROT 8.3* 7.0  ALBUMIN 4.0 3.3*   No results for input(s): LIPASE, AMYLASE in the last 168 hours. No results for input(s): AMMONIA in the last 168 hours.  CBC: Recent Labs  Lab 01/27/20 1454 01/27/20 2304 01/28/20 0514  WBC 5.0 4.6 3.8*  NEUTROABS 3.9  --   --   HGB 13.2 12.0* 11.3*  HCT 39.4 35.7* 34.1*  MCV 87.8 88.4 88.3  PLT PLATELET CLUMPS NOTED ON SMEAR, UNABLE TO ESTIMATE 83* 81*    Cardiac Enzymes: Recent Labs  Lab 01/27/20 1532  CKTOTAL 234    BNP: Invalid input(s): POCBNP  CBG: Recent Labs  Lab 01/27/20 2212 01/28/20 0756 01/28/20 1309  GLUCAP 172* 134* 182*    Microbiology: Results for orders placed or performed during the hospital encounter of 01/27/20  Respiratory Panel by RT PCR (Flu A&B, Covid) - Nasopharyngeal Swab     Status: None   Collection Time: 01/27/20  4:38 PM   Specimen: Nasopharyngeal Swab  Result Value Ref Range Status   SARS Coronavirus 2 by RT PCR NEGATIVE NEGATIVE Final    Comment: (NOTE) SARS-CoV-2 target nucleic acids are NOT DETECTED. The SARS-CoV-2 RNA is generally detectable in upper respiratoy specimens during the acute phase of infection. The lowest concentration of SARS-CoV-2 viral copies this assay can detect is 131 copies/mL. A negative result does not preclude SARS-Cov-2 infection and should not be used as the sole basis for treatment or other patient management decisions. A negative result may occur with  improper specimen collection/handling, submission of specimen other than nasopharyngeal swab, presence of viral mutation(s) within the areas targeted by this assay, and inadequate number of viral copies (<131 copies/mL). A negative result must be combined with clinical observations, patient history, and epidemiological information. The expected result is Negative. Fact  Sheet for Patients:  https://www.moore.com/ Fact Sheet for Healthcare Providers:  https://www.young.biz/ This test is not yet ap proved or cleared by the [place] FDA and  has been authorized for detection and/or diagnosis of SARS-CoV-2 by FDA under an Emergency Use Authorization (EUA). This EUA will remain  in effect (meaning this test can be used) for the duration of the COVID-19 declaration under Section 564(b)(1) of the Act, 21 U.S.C. section 360bbb-3(b)(1), unless the authorization is terminated or revoked sooner.    Influenza A by PCR NEGATIVE NEGATIVE Final   Influenza B by PCR NEGATIVE NEGATIVE Final    Comment: (NOTE) The Xpert Xpress SARS-CoV-2/FLU/RSV assay is intended as an aid in  the diagnosis of influenza from Nasopharyngeal swab specimens and  should not be used as a sole basis for treatment. Nasal washings and  aspirates are unacceptable for Xpert Xpress SARS-CoV-2/FLU/RSV  testing. Fact Sheet for Patients: https://www.moore.com/ Fact Sheet for Healthcare Providers: https://www.young.biz/ This test is not yet approved or cleared by the [place] FDA and  has been authorized for detection and/or diagnosis of SARS-CoV-2 by  FDA under an Emergency Use Authorization (EUA). This EUA will remain  in effect (meaning this test can be used) for the duration of the  Covid-19 declaration under  Section 564(b)(1) of the Act, 21  U.S.C. section 360bbb-3(b)(1), unless the authorization is  terminated or revoked. Performed at [facility], [address] [place], [place] [address]     Coagulation Studies: Recent Labs    01/27/20 1523  LABPROT 13.6  INR 1.1    Urinalysis:  Recent Labs  Lab 01/28/20 0554  COLORURINE YELLOW*  LABSPEC 1.026  PHURINE 5.0  GLUCOSEU NEGATIVE  HGBUR NEGATIVE  BILIRUBINUR NEGATIVE  KETONESUR 20*  PROTEINUR 100*  NITRITE NEGATIVE   LEUKOCYTESUR NEGATIVE    Lipid Panel:     Component Value Date/Time   CHOL 176 08/15/2015 1340   CHOL CANCELED 08/15/2015 1340   TRIG 50 08/15/2015 1340   TRIG CANCELED 08/15/2015 1340   HDL 131 08/15/2015 1340   VLDL 34 (H) 05/10/2015 1534   LDLCALC 35 08/15/2015 1340    HgbA1C:  Lab Results  Component Value Date   HGBA1C 7.2 (H) 01/27/2020    Urine Drug Screen:      Component Value Date/Time   LABOPIA NONE DETECTED 01/28/2020 0554   COCAINSCRNUR NONE DETECTED 01/28/2020 0554   LABBENZ POSITIVE (A) 01/28/2020 0554   AMPHETMU NONE DETECTED 01/28/2020 0554   THCU NONE DETECTED 01/28/2020 0554   LABBARB NONE DETECTED 01/28/2020 0554    Alcohol Level:  Recent Labs  Lab 01/27/20 1523  ETH <10    Other results: EKG: normal EKG, normal sinus rhythm, unchanged from previous tracings.  Imaging: CT Head Wo Contrast  Result Date: 01/27/2020 CLINICAL DATA:  Head trauma, headache Found down, unwitnessed fall. EXAM: CT HEAD WITHOUT CONTRAST TECHNIQUE: Contiguous axial images were obtained from the base of the skull through the vertex without intravenous contrast. COMPARISON:  Head CT 08/02/2016 FINDINGS: Brain: No intracranial hemorrhage, mass effect, or midline shift. Mild to moderate chronic small vessel ischemia. Remote lacunar infarct in right basal ganglia, unchanged. No hydrocephalus. The basilar cisterns are patent. No evidence of territorial infarct or acute ischemia. No extra-axial or intracranial fluid collection. Vascular: Atherosclerosis of skullbase vasculature without hyperdense vessel or abnormal calcification. Skull: No fracture or focal lesion. Sinuses/Orbits: Mild retained secretions in right side of sphenoid sinus. Paranasal sinuses are otherwise clear. Included orbits are unremarkable. Other: None. IMPRESSION: 1. No acute intracranial abnormality. No skull fracture. 2. Unchanged chronic small vessel ischemia and remote lacunar infarct in the right basal ganglia.  Electronically Signed   By: Keith Rake M.D.   On: 01/27/2020 15:53   CT Cervical Spine Wo Contrast  Result Date: 01/27/2020 CLINICAL DATA:  Neck trauma, uncomplicated (NEXUS/CCR neg) (Age [phone]) Patient found down, unwitnessed fall. EXAM: CT CERVICAL SPINE WITHOUT CONTRAST TECHNIQUE: Multidetector CT imaging of the cervical spine was performed without intravenous contrast. Multiplanar CT image reconstructions were also generated. COMPARISON:  Cervical spine CT 08/02/2016 FINDINGS: Alignment: Straightening of normal lordosis, unchanged from prior exam. No traumatic subluxation. Skull base and vertebrae: No acute fracture. Vertebral body heights are maintained. The dens and skull base are intact. Soft tissues and spinal canal: No prevertebral fluid or swelling. No visible canal hematoma. Disc levels: Degenerative disc disease at C5-C6 and C6-C7, similar to prior. There is scattered facet hypertrophy. Upper chest: No acute findings. Other: None. IMPRESSION: Degenerative change in the cervical spine without acute fracture or subluxation. Electronically Signed   By: Keith Rake M.D.   On: 01/27/2020 15:57   US RENAL  Result Date: 01/28/2020 CLINICAL DATA:  Acute renal insufficiency. EXAM: RENAL / URINARY TRACT ULTRASOUND COMPLETE COMPARISON:  None. FINDINGS: Right  Kidney: Renal measurements: 10.4 x 5.4 x 5.6 cm = volume: 164 mL . Echogenicity within normal limits. No mass or hydronephrosis visualized. Left Kidney: Renal measurements: 10.7 x 5.9 x 4.6 cm = volume: 150 mL. Echogenicity within normal limits. No mass or hydronephrosis visualized. Bladder: Appears normal for degree of bladder distention. Other: None. IMPRESSION: Normal renal ultrasound. Electronically Signed   By: Lupita Raider M.D.   On: 01/28/2020 11:36   US Carotid Bilateral  Result Date: 01/28/2020 CLINICAL DATA:  Stroke-like symptoms. EXAM: BILATERAL CAROTID DUPLEX ULTRASOUND TECHNIQUE: Wallace Cullens scale imaging, color Doppler and duplex  ultrasound were performed of bilateral carotid and vertebral arteries in the neck. COMPARISON:  07/02/2016 FINDINGS: Criteria: Quantification of carotid stenosis is based on velocity parameters that correlate the residual internal carotid diameter with NASCET-based stenosis levels, using the diameter of the distal internal carotid lumen as the denominator for stenosis measurement. The following velocity measurements were obtained: RIGHT ICA: 59/18 cm/sec CCA: 85/16 cm/sec SYSTOLIC ICA/CCA RATIO:  0.7 ECA: 66 cm/sec LEFT ICA: 88/29 cm/sec CCA: 93/14 cm/sec SYSTOLIC ICA/CCA RATIO:  0.9 ECA: 71 cm/sec RIGHT CAROTID ARTERY: Echogenic plaque at the right carotid bulb. External carotid artery is patent with normal waveform. Normal waveforms and velocities in the internal carotid artery. RIGHT VERTEBRAL ARTERY: Antegrade flow and normal waveform in the right vertebral artery. LEFT CAROTID ARTERY: Small amount of echogenic and heterogeneous plaque at the left carotid bulb. External carotid artery is patent with normal waveform. Normal waveforms and velocities in the internal carotid artery. LEFT VERTEBRAL ARTERY: Antegrade flow and normal waveform in the left vertebral artery. 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 Estimated degree of stenosis in the internal carotid arteries is less than 50% bilaterally. Patent vertebral arteries with antegrade flow. Electronically Signed   By: Richarda Overlie M.D.   On: 01/28/2020 11:34     Assessment/Plan: 59 y.o. male past medical history significant for alcohol withdrawal seizures, arthritis, CVA, hyperlipidemia, hypertension. He is a resident of a group home. The patient states that he can remember waking up this morning, but nothing since then until he EMS arrived. Apparently the patient was confused when they arrived, but was oriented once he was evaluated in [facility] ED. Significant daily drinking  at least 1/2 of 1/5 each evening. He states that he has  been in his usual state of health.  CTH no acute abnormalities    - Appears to have improved and back to baseline.   - No focal weakness - possibly withdrawal seizure event but in setting of not drinking etoh night prior  - no anti epileptic medications - No need for further imaging from neurological stand point 01/28/2020, 1:18 PM

## 2020-01-28 NOTE — Progress Notes (Signed)
*  PRELIMINARY RESULTS* Echocardiogram 2D Echocardiogram has been performed.  Brad Graham 01/28/2020, 10:43 AM 

## 2020-01-28 NOTE — Evaluation (Addendum)
Occupational Therapy Evaluation Patient Details Name: Brad Graham MRN: [id number] DOB: 1961/04/16 Today's Date: 01/28/2020    History of Present Illness Brad Graham is a 59 y.o. male who presented to [facility] ED via EMS after being found down in his room at his group home. PMH includes: h/o CVA, HLD, HTN, alcohol abuse. At baseline pt performs mostly household distance AMB with SPC.   Clinical Impression   [name] presents to OT with generalized weakness, impaired balance, and decreased safety awareness after recent fall.  He is grossly oriented to setting and date, but has limited recall of events that led to his fall and coming to the ED.  [name] lives in a group home and is generally mod I with BADLs at baseline.  He uses a straight cane for household mobility and sponge bathes in his shared bathroom.  [name] grossly requires supervision or stand by assist for BADLs today 2/2 functional deficits (see OT problem list).  OTR provided stand by assist for bed mobility with pt using momentum and heavy effort for supine <> sit and sit <> stand transfers with straight cane.  Pt unable to reach feet while seated EOB to don/doff footwear, stating he is able to independently doff/don footwear at home.  OTR educated pt on safety and fall prevention strategies for improved safety at discharge.  Pt reports increased shortness of breath with activity.  [name] will continue to benefit from skilled OT services to address endurance, functional strengthening, safety and fall prevention strategies, and independence in ADLs.  Recommend HHOT at discharge to address these same goals.    Follow Up Recommendations  Home health OT;Supervision - Intermittent    Equipment Recommendations  None recommended by OT    Recommendations for Other Services       Precautions / Restrictions Precautions Precautions: Fall;Other (comment)(seizure) Restrictions Weight Bearing Restrictions: No       Mobility Bed Mobility Overal bed mobility: Needs Assistance Bed Mobility: Supine to Sit;Sit to Supine     Supine to sit: Supervision Sit to supine: Supervision   General bed mobility comments: poor awareness of lines and leads, rocks to use momentum for sit > supine  Transfers Overall transfer level: Needs assistance Equipment used: Straight cane Transfers: Sit to/from Stand Sit to Stand: Min guard         General transfer comment: heavy effort, mild ataxia (suspected to be chronic), no LOB    Balance Overall balance assessment: History of Falls;Mild deficits observed, not formally tested;Modified Independent                                         ADL either performed or assessed with clinical judgement   ADL Overall ADL's : Needs assistance/impaired                                       General ADL Comments: Pt grossly requires supervision/stand by assist for BADLs 2/2 decreased safety awareness and impaired balance. Pt appears to be mod-max assist for donning footwear 2/2 inability to reach feet.  Pt states this is due to weakness since being in hospital.     Vision Baseline Vision/History: No visual deficits Patient Visual Report: No change from baseline       Perception  Praxis      Pertinent Vitals/Pain Pain Assessment: No/denies pain     Hand Dominance     Extremity/Trunk Assessment Upper Extremity Assessment Upper Extremity Assessment: Generalized weakness;Overall [facility] for tasks assessed   Lower Extremity Assessment Lower Extremity Assessment: Generalized weakness;Overall [facility] for tasks assessed   Cervical / Trunk Assessment Cervical / Trunk Assessment: Normal   Communication Communication Communication: No difficulties   Cognition Arousal/Alertness: Awake/alert Behavior During Therapy: WFL for tasks assessed/performed Overall Cognitive Status: Within Functional Limits for tasks assessed                                  General Comments: oriented to setting and date.  Pt stated year was "1921" but verbalized agreement when corrected (suspect due to word finding difficulty, not disorientation to year).  Pt with decreased recall of recent events.  Slightly impulsive with decreased safety awareness.   General Comments       Exercises Other Exercises Other Exercises: educated pt on OT role and plan of care, fall and safety precautions, self care, functional transfers   Shoulder Instructions      Home Living Family/patient expects to be discharged to:: Group home(pt reports staying in a boarding house) Living Arrangements: Group Home                               Additional Comments: Pt has his own bedroom on the second floor of the boarding house.  He uses a shared bathroom with tub/shower unit.  Pt reports there are grab bars by the toilet.  Pt reports getting meals from a food pantry next to the group home.      Prior Functioning/Environment Level of Independence: Independent with assistive device(s)        Comments: Pt uses SPC for household mobility.  He spends most of his days at home, getting meals nearby the group home.  He sponge bathes by sitting near the tub and washing self independently.  He reports being mod I in personal hygiene and cleaning his room.  Pt relies on friends for transportation.  Pt does not take any medications at home other than ibuprofen PRN.  He is on disability.        OT Problem List: Decreased strength;Decreased range of motion;Decreased activity tolerance;Impaired balance (sitting and/or standing);Decreased cognition;Decreased safety awareness;Decreased coordination;Decreased knowledge of use of DME or AE;Decreased knowledge of precautions      OT Treatment/Interventions: Self-care/ADL training;Therapeutic exercise;Energy conservation;DME and/or AE instruction;Therapeutic activities;Patient/family education;Balance training     OT Goals(Current goals can be found in the care plan section) Acute Rehab OT Goals Patient Stated Goal: to go home OT Goal Formulation: With patient Time For Goal Achievement: 02/11/20 Potential to Achieve Goals: Good  OT Frequency: Min 1X/week   Barriers to D/C: Decreased caregiver support          Co-evaluation              AM-PAC OT "6 Clicks" Daily Activity     Outcome Measure Help from another person eating meals?: None Help from another person taking care of personal grooming?: None Help from another person toileting, which includes using toliet, bedpan, or urinal?: A Little Help from another person bathing (including washing, rinsing, drying)?: A Little Help from another person to put on and taking off regular upper body clothing?: None Help from another person to  put on and taking off regular lower body clothing?: A Little 6 Click Score: 21   End of Session Equipment Utilized During Treatment: Other (comment)(straight cane) Nurse Communication: Other (comment)(bed alarm)  Activity Tolerance: Patient tolerated treatment well Patient left: in bed;with call bell/phone within reach  OT Visit Diagnosis: Muscle weakness (generalized) (M62.81);Unsteadiness on feet (R26.81)                Time: 1610-9604 OT Time Calculation (min): 34 min Charges:  OT General Charges $OT Visit: 1 Visit OT Evaluation $OT Eval Moderate Complexity: 1 Mod OT Treatments $Self Care/Home Management : 23-37 mins  Kathyrn Drown Dionta Larke, OTR/L 01/28/20, 2:19 PM

## 2020-01-29 LAB — DRUG SCREEN 764883 11+OXYCO+ALC+CRT-BUND
Amphetamines, Urine: NEGATIVE ng/mL
BENZODIAZ UR QL: NEGATIVE ng/mL
Barbiturate: NEGATIVE ng/mL
Cannabinoid Quant, Ur: NEGATIVE ng/mL
Cocaine (Metabolite): NEGATIVE ng/mL
Creatinine: 248.3 mg/dL (ref 20.0–300.0)
Ethanol: NEGATIVE %
Meperidine: NEGATIVE ng/mL
Methadone Screen, Urine: NEGATIVE ng/mL
OPIATE SCREEN URINE: NEGATIVE ng/mL
Oxycodone/Oxymorphone, Urine: NEGATIVE ng/mL
Phencyclidine: NEGATIVE ng/mL
Propoxyphene, Urine: NEGATIVE ng/mL
Tramadol: NEGATIVE ng/mL
pH, Urine: 5.4 (ref 4.5–8.9)

## 2020-01-29 LAB — URINE CULTURE: Culture: NO GROWTH

## 2020-02-02 ENCOUNTER — Other Ambulatory Visit: Payer: Self-pay

## 2020-02-02 ENCOUNTER — Encounter: Payer: Self-pay | Admitting: Nurse Practitioner

## 2020-02-02 ENCOUNTER — Ambulatory Visit (INDEPENDENT_AMBULATORY_CARE_PROVIDER_SITE_OTHER): Payer: Medicaid Other | Admitting: Nurse Practitioner

## 2020-02-02 VITALS — BP 172/114 | HR 103 | Temp 98.1°F | Ht 68.03 in | Wt 239.5 lb

## 2020-02-02 DIAGNOSIS — E785 Hyperlipidemia, unspecified: Secondary | ICD-10-CM | POA: Diagnosis not present

## 2020-02-02 DIAGNOSIS — R739 Hyperglycemia, unspecified: Secondary | ICD-10-CM | POA: Diagnosis not present

## 2020-02-02 DIAGNOSIS — I10 Essential (primary) hypertension: Secondary | ICD-10-CM | POA: Diagnosis not present

## 2020-02-02 MED ORDER — CARVEDILOL 25 MG PO TABS: 25.0000 mg | ORAL_TABLET | Freq: Two times a day (BID) | ORAL | 0 refills | Status: DC

## 2020-02-02 MED ORDER — FOLIC ACID 1 MG PO TABS: 1.0000 mg | ORAL_TABLET | Freq: Every day | ORAL | 0 refills | Status: DC

## 2020-02-02 MED ORDER — AMLODIPINE BESYLATE 5 MG PO TABS: 5.0000 mg | ORAL_TABLET | Freq: Every day | ORAL | 0 refills | Status: DC

## 2020-02-02 MED ORDER — METFORMIN HCL 1000 MG PO TABS: 1000.0000 mg | ORAL_TABLET | Freq: Every day | ORAL | 0 refills | Status: DC

## 2020-02-02 MED ORDER — ASPIRIN EC 81 MG PO TBEC: 81.0000 mg | DELAYED_RELEASE_TABLET | Freq: Every day | ORAL | 0 refills | Status: DC

## 2020-02-02 MED ORDER — THIAMINE HCL 100 MG PO TABS: 100.0000 mg | ORAL_TABLET | Freq: Every day | ORAL | 0 refills | Status: DC

## 2020-02-02 MED ORDER — ADULT MULTIVITAMIN W/MINERALS CH: 1.0000 | ORAL_TABLET | Freq: Every day | ORAL | 0 refills | Status: DC

## 2020-02-02 MED ORDER — ATORVASTATIN CALCIUM 20 MG PO TABS: 20.0000 mg | ORAL_TABLET | Freq: Every day | ORAL | 0 refills | Status: DC

## 2020-02-02 NOTE — Progress Notes (Signed)
BP (!) 172/114   Pulse (!) 103   Temp 98.1 F (36.7 C)   Ht 5' 8.03" (1.728 m)   Wt 239 lb 8 oz (108.6 kg)   SpO2 100%   BMI 36.38 kg/m    Subjective:    Patient ID: Brad Graham, male    DOB: 01-Oct-1960, 59 y.o.   MRN: [id number]  HPI: Brad Graham is a 60 y.o. male presenting for new patient visit to establish care.  Introduced to Designer, jewellery role and practice setting.  All questions answered.  Discussed provider/patient relationship and expectations.  Chief Complaint  Patient presents with  . Establish Care    Patient was admitted for a stroke, he states that he has not taken any medication since he left the hospital   ER FOLLOW UP Time since discharge: ~1 week Hospital/facility: [facility] Diagnosis: patient reports "stroke," was found down at home Procedures/tests: CT head negative and ECHO 50-55% Consultants: none New medications:  Discharge instructions:   Status: better   Patient reports he has not taken any medication since he left the hospital.  He was admitted for observation on 01/27/2020 after being found down at the group home he lives at.  He was discharged on 01/28/2020.    Patient states he has been out of medication for about 1.5 years.    HYPERTENSION Patient has been on carvedilol 25 and amlodipine 5 mg in the past   Hypertension status: uncontrolled  Satisfied with current treatment? Not currently on treatment Duration of hypertension: chronic BP monitoring frequency:  not checking BP medication side effects:  no Medication compliance: not currently taking Previous BP meds: amlodipine  Aspirin: yes Recurrent headaches: no Visual changes: no Palpitations: no Dyspnea: no Chest pain: no Lower extremity edema: no Dizzy/lightheaded: no  HYPERLIPIDEMIA Patient was taking 20 mg of atorvastatin in the past. Hyperlipidemia status: excellent compliance Satisfied with current treatment?  yes Side effects:  no Medication compliance:  excellent compliance Past cholesterol meds:  Atorvastatin 20 mg Supplements: none Aspirin:  yes The ASCVD Risk score Mikey Bussing DC Jr., et al., 2013) failed to calculate for the following reasons:   The patient has a prior MI or stroke diagnosis Chest pain:  no Coronary artery disease:  yes Family history CAD:  yes Family history early CAD:  no  DIABETES Patient was taking Metformin 1000 mg in past.   Hypoglycemic episodes:no Polydipsia/polyuria: no Visual disturbance: no Chest pain: no Paresthesias: no Glucose Monitoring: no Blood Pressure Monitoring: not checking Retinal Examination: Not up to Date Foot Exam: Not up to Date Diabetic Education: Completed Pneumovax: Not up to Date Influenza: Up to Date Aspirin: yes  No Known Allergies  Outpatient Encounter Medications as of 02/02/2020  Medication Sig  . amLODipine (NORVASC) 5 MG tablet Take 1 tablet (5 mg total) by mouth daily.  Marland Kitchen aspirin EC 81 MG tablet Take 1 tablet (81 mg total) by mouth daily.  Marland Kitchen atorvastatin (LIPITOR) 20 MG tablet Take 1 tablet (20 mg total) by mouth daily at 6 PM.  . blood glucose meter kit and supplies KIT Dispense based on patient and insurance preference. Use up to four times daily as directed. (FOR ICD-9 250.00, 250.01).  . carvedilol (COREG) 25 MG tablet Take 1 tablet (25 mg total) by mouth 2 (two) times daily with a meal.  . folic acid (FOLVITE) 1 MG tablet Take 1 tablet (1 mg total) by mouth daily.  . hydrALAZINE (APRESOLINE) 25 MG tablet Take 1 tablet (25  mg total) by mouth every 6 (six) hours.  . metFORMIN (GLUCOPHAGE) 1000 MG tablet Take 1 tablet (1,000 mg total) by mouth daily with breakfast.  . Multiple Vitamin (MULTIVITAMIN WITH MINERALS) TABS tablet Take 1 tablet by mouth daily.  . tamsulosin (FLOMAX) 0.4 MG CAPS capsule Take 1 capsule (0.4 mg total) by mouth daily after supper.  . thiamine 100 MG tablet Take 1 tablet (100 mg total) by mouth daily.  . [DISCONTINUED] amLODipine (NORVASC) 5 MG  tablet Take 1 tablet (5 mg total) by mouth daily.  . [DISCONTINUED] atorvastatin (LIPITOR) 20 MG tablet Take 1 tablet (20 mg total) by mouth daily at 6 PM.  . [DISCONTINUED] carvedilol (COREG) 25 MG tablet Take 1 tablet (25 mg total) by mouth 2 (two) times daily with a meal.  . [DISCONTINUED] folic acid (FOLVITE) 1 MG tablet Take 1 tablet (1 mg total) by mouth daily.  . [DISCONTINUED] metFORMIN (GLUCOPHAGE) 1000 MG tablet Take 1 tablet (1,000 mg total) by mouth daily with breakfast.  . [DISCONTINUED] Multiple Vitamin (MULTIVITAMIN WITH MINERALS) TABS tablet Take 1 tablet by mouth daily.  . [DISCONTINUED] thiamine 100 MG tablet Take 1 tablet (100 mg total) by mouth daily.   No facility-administered encounter medications on file as of 02/02/2020.   Active Ambulatory Problems    Diagnosis Date Noted  . Essential hypertension 05/10/2015  . Hyperglycemia 05/10/2015  . Hyperlipidemia 05/10/2015  . Proteinuria 05/10/2015  . Syncope 07/02/2016  . Seizure ([place]) 08/02/2016  . Syncope and collapse 01/27/2020  . Unresponsive episode 01/28/2020   Resolved Ambulatory Problems    Diagnosis Date Noted  . No Resolved Ambulatory Problems   Past Medical History:  Diagnosis Date  . Alcohol abuse   . Arthritis   . H/O: CVA (cerebrovascular accident)   . Hypertension    Past Medical History:  Diagnosis Date  . Alcohol abuse   . Arthritis   . H/O: CVA (cerebrovascular accident)   . Hyperlipidemia   . Hypertension    Past Surgical History:  Procedure Laterality Date  . none     Family History  Problem Relation Age of Onset  . Diabetes Mother   . Stroke Mother   . Heart disease Maternal Grandmother   . Heart disease Maternal Grandfather    Social History   Tobacco Use  . Smoking status: Current Every Day Smoker    Packs/day: 0.50    Types: Cigarettes  . Smokeless tobacco: Never Used  Substance Use Topics  . Alcohol use: Yes    Comment: fifth every other day  . Drug use: No    Review of Systems  Constitutional: Negative.  Negative for activity change, appetite change, fatigue and fever.  Eyes: Negative.  Negative for visual disturbance.  Respiratory: Negative.  Negative for cough, shortness of breath and wheezing.   Cardiovascular: Negative.  Negative for chest pain, palpitations and leg swelling.  Gastrointestinal: Negative.  Negative for abdominal distention, abdominal pain, diarrhea, nausea and vomiting.  Musculoskeletal: Negative.   Skin: Negative.   Neurological: Negative.  Negative for dizziness, weakness, light-headedness and headaches.  Psychiatric/Behavioral: Negative.  Negative for agitation, decreased concentration and sleep disturbance. The patient is not nervous/anxious.     Per HPI unless specifically indicated above     Objective:    BP (!) 172/114   Pulse (!) 103   Temp 98.1 F (36.7 C)   Ht 5' 8.03" (1.728 m)   Wt 239 lb 8 oz (108.6 kg)   SpO2 100%  BMI 36.38 kg/m   Wt Readings from Last 3 Encounters:  02/02/20 239 lb 8 oz (108.6 kg)  01/27/20 230 lb (104.3 kg)  08/02/16 220 lb 1.6 oz (99.8 kg)    Physical Exam Vitals and nursing note reviewed.  Constitutional:      General: He is not in acute distress.    Appearance: Normal appearance. He is not toxic-appearing.  Eyes:     Extraocular Movements: Extraocular movements intact.     Pupils: Pupils are equal, round, and reactive to light.  Cardiovascular:     Rate and Rhythm: Normal rate and regular rhythm.     Heart sounds: Normal heart sounds. No murmur.  Pulmonary:     Effort: Pulmonary effort is normal. No respiratory distress.     Breath sounds: Normal breath sounds. No wheezing or rhonchi.  Abdominal:     General: Abdomen is flat. Bowel sounds are normal. There is distension.     Palpations: There is no mass.     Tenderness: There is no abdominal tenderness.     Hernia: No hernia is present.  Musculoskeletal:        General: Normal range of motion.     Cervical  back: Normal range of motion. No rigidity.  Skin:    General: Skin is warm and dry.     Capillary Refill: Capillary refill takes less than 2 seconds.     Coloration: Skin is not jaundiced or pale.  Neurological:     Mental Status: He is alert and oriented to person, place, and time. Mental status is at baseline.     Cranial Nerves: Dysarthria (stuttering, chronic per patient) present.     Motor: No weakness.     Gait: Gait normal.  Psychiatric:        Mood and Affect: Mood normal.        Behavior: Behavior normal.        Thought Content: Thought content normal.        Judgment: Judgment normal.    Results for orders placed or performed during the hospital encounter of 01/27/20  Respiratory Panel by RT PCR (Flu A&B, Covid) - Nasopharyngeal Swab   Specimen: Nasopharyngeal Swab  Result Value Ref Range   SARS Coronavirus 2 by RT PCR NEGATIVE NEGATIVE   Influenza A by PCR NEGATIVE NEGATIVE   Influenza B by PCR NEGATIVE NEGATIVE  Urine culture   Specimen: Urine, Random  Result Value Ref Range   Specimen Description      URINE, RANDOM Performed at [facility], [address], [place], [place] [address]    Special Requests      NONE Performed at [facility], [address] [place], [place] [address]    Culture      NO GROWTH Performed at [facility], [place] [address], [place], [place] [address]    Report Status 01/29/2020 FINAL   Comprehensive metabolic panel  Result Value Ref Range   Sodium 132 (L) 135 - 145 mmol/L   Potassium 4.0 3.5 - 5.1 mmol/L   Chloride 100 98 - 111 mmol/L   CO2 15 (L) 22 - 32 mmol/L   Glucose, Bld 238 (H) 70 - 99 mg/dL   BUN 26 (H) 6 - 20 mg/dL   Creatinine, Ser 1.96 (H) 0.61 - 1.24 mg/dL   Calcium 8.8 (L) 8.9 - 10.3 mg/dL   Total Protein 8.3 (H) 6.5 - 8.1 g/dL   Albumin 4.0 3.5 - 5.0 g/dL  AST 114 (H) 15 - 41 U/L   ALT 49 (H) 0 - 44 U/L   Alkaline Phosphatase 79 38 - 126 U/L   Total Bilirubin 1.0 0.3 - 1.2 mg/dL    GFR calc non Af Amer 36 (L) >60 mL/min   GFR calc Af Amer 42 (L) >60 mL/min   Anion gap 17 (H) 5 - 15  Protime-INR  Result Value Ref Range   Prothrombin Time 13.6 11.4 - 15.2 seconds   INR 1.1 0.8 - 1.2  APTT  Result Value Ref Range   aPTT <24 (L) 24 - 36 seconds  Ethanol  Result Value Ref Range   Alcohol, Ethyl (B) <10 <10 mg/dL  Urine Drug Screen, Qualitative ([facility] only)  Result Value Ref Range   Tricyclic, Ur Screen NONE DETECTED NONE DETECTED   Amphetamines, Ur Screen NONE DETECTED NONE DETECTED   MDMA (Ecstasy)Ur Screen NONE DETECTED NONE DETECTED   Cocaine Metabolite,Ur [place] NONE DETECTED NONE DETECTED   Opiate, Ur Screen NONE DETECTED NONE DETECTED   Phencyclidine (PCP) Ur S NONE DETECTED NONE DETECTED   Cannabinoid 50 Ng, Ur [place] NONE DETECTED NONE DETECTED   Barbiturates, Ur Screen NONE DETECTED NONE DETECTED   Benzodiazepine, Ur Scrn POSITIVE (A) NONE DETECTED   Methadone Scn, Ur NONE DETECTED NONE DETECTED  Urinalysis, Complete w Microscopic  Result Value Ref Range   Color, Urine YELLOW (A) YELLOW   APPearance HAZY (A) CLEAR   Specific Gravity, Urine 1.026 1.005 - 1.030   pH 5.0 5.0 - 8.0   Glucose, UA NEGATIVE NEGATIVE mg/dL   Hgb urine dipstick NEGATIVE NEGATIVE   Bilirubin Urine NEGATIVE NEGATIVE   Ketones, ur 20 (A) NEGATIVE mg/dL   Protein, ur 100 (A) NEGATIVE mg/dL   Nitrite NEGATIVE NEGATIVE   Leukocytes,Ua NEGATIVE NEGATIVE   RBC / HPF 0-5 0 - 5 RBC/hpf   WBC, UA 0-5 0 - 5 WBC/hpf   Bacteria, UA NONE SEEN NONE SEEN   Squamous Epithelial / LPF NONE SEEN 0 - 5   Mucus PRESENT   CK  Result Value Ref Range   Total CK 234 49 - 397 U/L  CBC with Differential/Platelet  Result Value Ref Range   WBC 5.0 4.0 - 10.5 K/uL   RBC 4.49 4.22 - 5.81 MIL/uL   Hemoglobin 13.2 13.0 - 17.0 g/dL   HCT 39.4 39.0 - 52.0 %   MCV 87.8 80.0 - 100.0 fL   MCH 29.4 26.0 - 34.0 pg   MCHC 33.5 30.0 - 36.0 g/dL   RDW 13.8 11.5 - 15.5 %   Platelets PLATELET CLUMPS NOTED ON SMEAR,  UNABLE TO ESTIMATE 150 - 400 K/uL   nRBC 0.0 0.0 - 0.2 %   Neutrophils Relative % 78 %   Neutro Abs 3.9 1.7 - 7.7 K/uL   Lymphocytes Relative 15 %   Lymphs Abs 0.8 0.7 - 4.0 K/uL   Monocytes Relative 5 %   Monocytes Absolute 0.3 0.1 - 1.0 K/uL   Eosinophils Relative 0 %   Eosinophils Absolute 0.0 0.0 - 0.5 K/uL   Basophils Relative 1 %   Basophils Absolute 0.1 0.0 - 0.1 K/uL   Immature Granulocytes 1 %   Abs Immature Granulocytes 0.04 0.00 - 0.07 K/uL  [id number] 11+Oxyco+Alc+Crt-Bund  Result Value Ref Range   Ethanol Negative Cutoff=0.020 %   Amphetamines, Urine Negative Cutoff=1000 ng/mL   Barbiturate Negative Cutoff=200 ng/mL   BENZODIAZ UR QL Negative Cutoff=200 ng/mL   Cannabinoid Quant, Ur Negative Cutoff=50 ng/mL  Cocaine (Metabolite) Negative Cutoff=300 ng/mL   OPIATE SCREEN URINE Negative Cutoff=300 ng/mL   Oxycodone/Oxymorphone, Urine Negative Cutoff=300 ng/mL   Phencyclidine Negative Cutoff=25 ng/mL   Methadone Screen, Urine Negative Cutoff=300 ng/mL   Propoxyphene, Urine Negative Cutoff=300 ng/mL   Meperidine Negative Cutoff=200 ng/mL   Tramadol Negative Cutoff=200 ng/mL   Creatinine 248.3 20.0 - 300.0 mg/dL   pH, Urine 5.4 4.5 - 8.9  Hemoglobin A1c  Result Value Ref Range   Hgb A1c MFr Bld 7.2 (H) 4.8 - 5.6 %   Mean Plasma Glucose 159.94 mg/dL  Beta-hydroxybutyric acid  Result Value Ref Range   Beta-Hydroxybutyric Acid 0.93 (H) 0.05 - 0.27 mmol/L  HIV Antibody (routine testing w rflx)  Result Value Ref Range   HIV Screen 4th Generation wRfx Non Reactive Non Reactive  CBC  Result Value Ref Range   WBC 4.6 4.0 - 10.5 K/uL   RBC 4.04 (L) 4.22 - 5.81 MIL/uL   Hemoglobin 12.0 (L) 13.0 - 17.0 g/dL   HCT 35.7 (L) 39.0 - 52.0 %   MCV 88.4 80.0 - 100.0 fL   MCH 29.7 26.0 - 34.0 pg   MCHC 33.6 30.0 - 36.0 g/dL   RDW 13.9 11.5 - 15.5 %   Platelets 83 (L) 150 - 400 K/uL   nRBC 0.0 0.0 - 0.2 %  Comprehensive metabolic panel  Result Value Ref Range   Sodium 133 (L)  135 - 145 mmol/L   Potassium 3.5 3.5 - 5.1 mmol/L   Chloride 104 98 - 111 mmol/L   CO2 19 (L) 22 - 32 mmol/L   Glucose, Bld 138 (H) 70 - 99 mg/dL   BUN 26 (H) 6 - 20 mg/dL   Creatinine, Ser 1.52 (H) 0.61 - 1.24 mg/dL   Calcium 8.1 (L) 8.9 - 10.3 mg/dL   Total Protein 7.0 6.5 - 8.1 g/dL   Albumin 3.3 (L) 3.5 - 5.0 g/dL   AST 129 (H) 15 - 41 U/L   ALT 49 (H) 0 - 44 U/L   Alkaline Phosphatase 65 38 - 126 U/L   Total Bilirubin 1.6 (H) 0.3 - 1.2 mg/dL   GFR calc non Af Amer 49 (L) >60 mL/min   GFR calc Af Amer 57 (L) >60 mL/min   Anion gap 10 5 - 15  CBC  Result Value Ref Range   WBC 3.8 (L) 4.0 - 10.5 K/uL   RBC 3.86 (L) 4.22 - 5.81 MIL/uL   Hemoglobin 11.3 (L) 13.0 - 17.0 g/dL   HCT 34.1 (L) 39.0 - 52.0 %   MCV 88.3 80.0 - 100.0 fL   MCH 29.3 26.0 - 34.0 pg   MCHC 33.1 30.0 - 36.0 g/dL   RDW 14.0 11.5 - 15.5 %   Platelets 81 (L) 150 - 400 K/uL   nRBC 0.0 0.0 - 0.2 %  Glucose, capillary  Result Value Ref Range   Glucose-Capillary 172 (H) 70 - 99 mg/dL  Fibrin derivatives D-Dimer ([facility] only)  Result Value Ref Range   Fibrin derivatives D-dimer ([facility]) 620.27 (H) 0.00 - 499.00 ng/mL (FEU)  Glucose, capillary  Result Value Ref Range   Glucose-Capillary 134 (H) 70 - 99 mg/dL  Glucose, capillary  Result Value Ref Range   Glucose-Capillary 182 (H) 70 - 99 mg/dL  Glucose, capillary  Result Value Ref Range   Glucose-Capillary 200 (H) 70 - 99 mg/dL   Comment 1 Notify RN   ECHOCARDIOGRAM COMPLETE  Result Value Ref Range   Weight 3,680 oz  Height 71 in   BP 154/98 mmHg  Troponin I (High Sensitivity)  Result Value Ref Range   Troponin I (High Sensitivity) 9 <18 ng/L  Troponin I (High Sensitivity)  Result Value Ref Range   Troponin I (High Sensitivity) 18 (H) <18 ng/L      Assessment & Plan:   Problem List Items Addressed This Visit      Cardiovascular and Mediastinum   Essential hypertension - Primary    Chronic, uncontrolled.  Will resume amlodipine 5 mg daily and  carvedilol 25 mg twice daily.  CBC and CMP checked today. Will follow up in 2 weeks and consider adding a third medication at that time if needed.      Relevant Medications   amLODipine (NORVASC) 5 MG tablet   aspirin EC 81 MG tablet   atorvastatin (LIPITOR) 20 MG tablet   carvedilol (COREG) 25 MG tablet   Other Relevant Orders   CBC with Differential/Platelet   Comprehensive metabolic panel     Other   Hyperglycemia    Chronic, ongoing.  A1c 7.2% at recent hospital visit.  Will start metformin 1000 mg daily today and recheck A1c in about 3 months.  Will check CMP and CBC today.  Educated on hypoglycemic episodes and to return to clinic with any low blood sugars.  Follow up in 2 weeks.      Relevant Medications   aspirin EC 81 MG tablet   metFORMIN (GLUCOPHAGE) 1000 MG tablet   Other Relevant Orders   CBC with Differential/Platelet   Comprehensive metabolic panel   Hyperlipidemia    Chronic, ongoing.  History of stroke in past.  Will resume statin therapy and check lipids today.  CBC and CMP also checked today.      Relevant Medications   amLODipine (NORVASC) 5 MG tablet   aspirin EC 81 MG tablet   atorvastatin (LIPITOR) 20 MG tablet   carvedilol (COREG) 25 MG tablet   Other Relevant Orders   CBC with Differential/Platelet   Comprehensive metabolic panel   Lipid Panel w/o Chol/HDL Ratio       Follow up plan: Return in about 2 weeks (around 02/16/2020) for HTN, HLD, DM follow up.

## 2020-02-02 NOTE — Assessment & Plan Note (Signed)
Chronic, ongoing.  History of stroke in past.  Will resume statin therapy and check lipids today.  CBC and CMP also checked today.

## 2020-02-02 NOTE — Assessment & Plan Note (Signed)
Chronic, uncontrolled.  Will resume amlodipine 5 mg daily and carvedilol 25 mg twice daily.  CBC and CMP checked today. Will follow up in 2 weeks and consider adding a third medication at that time if needed.

## 2020-02-02 NOTE — Patient Instructions (Signed)
Nice meeting you today, Brad Graham.  Be sure to start your medication and we will follow up in 2 weeks.  We will call you with your lab results tomorrow.  Take care and let me know if you have any questions or concerns. - Nezzie Manera  Hypoglycemia Hypoglycemia is when the sugar (glucose) level in your blood is too low. Signs of low blood sugar may include:  Feeling: ? Hungry. ? Worried or nervous (anxious). ? Sweaty and clammy. ? Confused. ? Dizzy. ? Sleepy. ? Sick to your stomach (nauseous).  Having: ? A fast heartbeat. ? A headache. ? A change in your vision. ? Tingling or no feeling (numbness) around your mouth, lips, or tongue. ? Jerky movements that you cannot control (seizure).  Having trouble with: ? Moving (coordination). ? Sleeping. ? Passing out (fainting). ? Getting upset easily (irritability). Low blood sugar can happen to people who have diabetes and people who do not have diabetes. Low blood sugar can happen quickly, and it can be an emergency. Treating low blood sugar Low blood sugar is often treated by eating or drinking something sugary right away, such as:  Fruit juice, 4-6 oz (120-150 mL).  Regular soda (not diet soda), 4-6 oz (120-150 mL).  Low-fat milk, 4 oz (120 mL).  Several pieces of hard candy.  Sugar or honey, 1 Tbsp (15 mL). Treating low blood sugar if you have diabetes If you can think clearly and swallow safely, follow the 15:15 rule:  Take 15 grams of a fast-acting carb (carbohydrate). Talk with your doctor about how much you should take.  Always keep a source of fast-acting carb with you, such as: ? Sugar tablets (glucose pills). Take 3-4 pills. ? 6-8 pieces of hard candy. ? 4-6 oz (120-150 mL) of fruit juice. ? 4-6 oz (120-150 mL) of regular (not diet) soda. ? 1 Tbsp (15 mL) honey or sugar.  Check your blood sugar 15 minutes after you take the carb.  If your blood sugar is still at or below 70 mg/dL (3.9 mmol/L), take 15 grams of a carb  again.  If your blood sugar does not go above 70 mg/dL (3.9 mmol/L) after 3 tries, get help right away.  After your blood sugar goes back to normal, eat a meal or a snack within 1 hour.  Treating very low blood sugar If your blood sugar is at or below 54 mg/dL (3 mmol/L), you have very low blood sugar (severe hypoglycemia). This may also cause:  Passing out.  Jerky movements you cannot control (seizure).  Losing consciousness (coma). This is an emergency. Do not wait to see if the symptoms will go away. Get medical help right away. Call your local emergency services (911 in the [place] Do not drive yourself to the hospital. If you have very low blood sugar and you cannot eat or drink, you may need a glucagon shot (injection). A family member or friend should learn how to check your blood sugar and how to give you a glucagon shot. Ask your doctor if you need to have a glucagon shot kit at home. Follow these instructions at home: General instructions  Take over-the-counter and prescription medicines only as told by your doctor.  Stay aware of your blood sugar as told by your doctor.  Limit alcohol intake to no more than 1 drink a day for nonpregnant women and 2 drinks a day for men. One drink equals 12 oz of beer (355 mL), 5 oz of wine (148 mL), or  1 oz of hard liquor (44 mL).  Keep all follow-up visits as told by your doctor. This is important. If you have diabetes:   Follow your diabetes care plan as told by your doctor. Make sure you: ? Know the signs of low blood sugar. ? Take your medicines as told. ? Follow your exercise and meal plan. ? Eat on time. Do not skip meals. ? Check your blood sugar as often as told by your doctor. Always check it before and after exercise. ? Follow your sick day plan when you cannot eat or drink normally. Make this plan ahead of time with your doctor.  Share your diabetes care plan with: ? Your work or school. ? People you live with.  Check  your pee (urine) for ketones: ? When you are sick. ? As told by your doctor.  Carry a card or wear jewelry that says you have diabetes. Contact a doctor if:  You have trouble keeping your blood sugar in your target range.  You have low blood sugar often. Get help right away if:  You still have symptoms after you eat or drink something sugary.  Your blood sugar is at or below 54 mg/dL (3 mmol/L).  You have jerky movements that you cannot control.  You pass out. These symptoms may be an emergency. Do not wait to see if the symptoms will go away. Get medical help right away. Call your local emergency services (911 in the [place] Do not drive yourself to the hospital. Summary  Hypoglycemia happens when the level of sugar (glucose) in your blood is too low.  Low blood sugar can happen to people who have diabetes and people who do not have diabetes. Low blood sugar can happen quickly, and it can be an emergency.  Make sure you know the signs of low blood sugar and know how to treat it.  Always keep a source of sugar (fast-acting carb) with you to treat low blood sugar. This information is not intended to replace advice given to you by your health care provider. Make sure you discuss any questions you have with your health care provider. Document Revised: 01/01/2019 Document Reviewed: 10/14/2015 Elsevier Patient Education  2020 [employer].

## 2020-02-02 NOTE — Assessment & Plan Note (Addendum)
Chronic, ongoing.  A1c 7.2% at recent hospital visit.  Will start metformin 1000 mg daily today and recheck A1c in about 3 months.  Will check CMP and CBC today.  Educated on hypoglycemic episodes and to return to clinic with any low blood sugars.  Follow up in 2 weeks.

## 2020-02-03 LAB — LIPID PANEL W/O CHOL/HDL RATIO
Cholesterol, Total: 222 mg/dL — ABNORMAL HIGH (ref 100–199)
HDL: 109 mg/dL (ref >39)
LDL Chol Calc (NIH): 85 mg/dL (ref 0–99)
Triglycerides: 174 mg/dL — ABNORMAL HIGH (ref 0–149)
VLDL Cholesterol Cal: 28 mg/dL (ref 5–40)

## 2020-02-03 LAB — CBC WITH DIFFERENTIAL/PLATELET
Basophils Absolute: 0.1 10*3/uL (ref 0.0–0.2)
Basos: 1 %
EOS (ABSOLUTE): 0 10*3/uL (ref 0.0–0.4)
Eos: 0 %
Hematocrit: 39.5 % (ref 37.5–51.0)
Hemoglobin: 12.9 g/dL — ABNORMAL LOW (ref 13.0–17.7)
Immature Grans (Abs): 0 10*3/uL (ref 0.0–0.1)
Immature Granulocytes: 1 %
Lymphocytes Absolute: 1.3 10*3/uL (ref 0.7–3.1)
Lymphs: 24 %
MCH: 29.3 pg (ref 26.6–33.0)
MCHC: 32.7 g/dL (ref 31.5–35.7)
MCV: 90 fL (ref 79–97)
Monocytes Absolute: 0.7 10*3/uL (ref 0.1–0.9)
Monocytes: 13 %
Neutrophils Absolute: 3.3 10*3/uL (ref 1.4–7.0)
Neutrophils: 61 %
Platelets: 176 10*3/uL (ref 150–450)
RBC: 4.4 x10E6/uL (ref 4.14–5.80)
RDW: 13.4 % (ref 11.6–15.4)
WBC: 5.5 10*3/uL (ref 3.4–10.8)

## 2020-02-03 LAB — COMPREHENSIVE METABOLIC PANEL
ALT: 44 IU/L (ref 0–44)
AST: 64 IU/L — ABNORMAL HIGH (ref 0–40)
Albumin/Globulin Ratio: 1.3 (ref 1.2–2.2)
Albumin: 4.5 g/dL (ref 3.8–4.9)
Alkaline Phosphatase: 92 IU/L (ref 39–117)
BUN/Creatinine Ratio: 11 (ref 10–24)
BUN: 17 mg/dL (ref 8–27)
Bilirubin Total: 0.5 mg/dL (ref 0.0–1.2)
CO2: 19 mmol/L — ABNORMAL LOW (ref 20–29)
Calcium: 9.7 mg/dL (ref 8.6–10.2)
Chloride: 102 mmol/L (ref 96–106)
Creatinine, Ser: 1.57 mg/dL — ABNORMAL HIGH (ref 0.76–1.27)
GFR calc Af Amer: 55 mL/min/{1.73_m2} — ABNORMAL LOW (ref >59)
GFR calc non Af Amer: 47 mL/min/{1.73_m2} — ABNORMAL LOW (ref >59)
Globulin, Total: 3.5 g/dL (ref 1.5–4.5)
Glucose: 123 mg/dL — ABNORMAL HIGH (ref 65–99)
Potassium: 4.3 mmol/L (ref 3.5–5.2)
Sodium: 136 mmol/L (ref 134–144)
Total Protein: 8 g/dL (ref 6.0–8.5)

## 2020-02-16 ENCOUNTER — Encounter: Payer: Self-pay | Admitting: Nurse Practitioner

## 2020-02-16 ENCOUNTER — Other Ambulatory Visit: Payer: Self-pay

## 2020-02-16 ENCOUNTER — Ambulatory Visit: Payer: Medicaid Other | Admitting: Nurse Practitioner

## 2020-02-16 VITALS — BP 154/98 | HR 94 | Temp 97.9°F | Wt 237.4 lb

## 2020-02-16 DIAGNOSIS — E782 Mixed hyperlipidemia: Secondary | ICD-10-CM | POA: Diagnosis not present

## 2020-02-16 DIAGNOSIS — I10 Essential (primary) hypertension: Secondary | ICD-10-CM | POA: Diagnosis not present

## 2020-02-16 DIAGNOSIS — D649 Anemia, unspecified: Secondary | ICD-10-CM

## 2020-02-16 DIAGNOSIS — E119 Type 2 diabetes mellitus without complications: Secondary | ICD-10-CM | POA: Diagnosis not present

## 2020-02-16 MED ORDER — AMLODIPINE BESYLATE 10 MG PO TABS: 10.0000 mg | ORAL_TABLET | Freq: Every day | ORAL | 0 refills | Status: DC

## 2020-02-16 NOTE — Patient Instructions (Signed)
DASH Eating Plan DASH stands for "Dietary Approaches to Stop Hypertension." The DASH eating plan is a healthy eating plan that has been shown to reduce high blood pressure (hypertension). It may also reduce your risk for type 2 diabetes, heart disease, and stroke. The DASH eating plan may also help with weight loss. What are tips for following this plan?  General guidelines  Avoid eating more than 2,300 mg (milligrams) of salt (sodium) a day. If you have hypertension, you may need to reduce your sodium intake to 1,500 mg a day.  Limit alcohol intake to no more than 1 drink a day for nonpregnant women and 2 drinks a day for men. One drink equals 12 oz of beer, 5 oz of wine, or 1 oz of hard liquor.  Work with your health care provider to maintain a healthy body weight or to lose weight. Ask what an ideal weight is for you.  Get at least 30 minutes of exercise that causes your heart to beat faster (aerobic exercise) most days of the week. Activities may include walking, swimming, or biking.  Work with your health care provider or diet and nutrition specialist (dietitian) to adjust your eating plan to your individual calorie needs. Reading food labels   Check food labels for the amount of sodium per serving. Choose foods with less than 5 percent of the Daily Value of sodium. Generally, foods with less than 300 mg of sodium per serving fit into this eating plan.  To find whole grains, look for the word "whole" as the first word in the ingredient list. Shopping  Buy products labeled as "low-sodium" or "no salt added."  Buy fresh foods. Avoid canned foods and premade or frozen meals. Cooking  Avoid adding salt when cooking. Use salt-free seasonings or herbs instead of table salt or sea salt. Check with your health care provider or pharmacist before using salt substitutes.  Do not fry foods. Cook foods using healthy methods such as baking, boiling, grilling, and broiling instead.  Cook with  heart-healthy oils, such as olive, canola, soybean, or sunflower oil. Meal planning  Eat a balanced diet that includes: ? 5 or more servings of fruits and vegetables each day. At each meal, try to fill half of your plate with fruits and vegetables. ? Up to 6-8 servings of whole grains each day. ? Less than 6 oz of lean meat, poultry, or fish each day. A 3-oz serving of meat is about the same size as a deck of cards. One egg equals 1 oz. ? 2 servings of low-fat dairy each day. ? A serving of nuts, seeds, or beans 5 times each week. ? Heart-healthy fats. Healthy fats called Omega-3 fatty acids are found in foods such as flaxseeds and coldwater fish, like sardines, salmon, and mackerel.  Limit how much you eat of the following: ? Canned or prepackaged foods. ? Food that is high in trans fat, such as fried foods. ? Food that is high in saturated fat, such as fatty meat. ? Sweets, desserts, sugary drinks, and other foods with added sugar. ? Full-fat dairy products.  Do not salt foods before eating.  Try to eat at least 2 vegetarian meals each week.  Eat more home-cooked food and less restaurant, buffet, and fast food.  When eating at a restaurant, ask that your food be prepared with less salt or no salt, if possible. What foods are recommended? The items listed may not be a complete list. Talk with your dietitian about   what dietary choices are best for you. Grains Whole-grain or whole-wheat bread. Whole-grain or whole-wheat pasta. Brown rice. Oatmeal. Quinoa. Bulgur. Whole-grain and low-sodium cereals. Pita bread. Low-fat, low-sodium crackers. Whole-wheat flour tortillas. Vegetables Fresh or frozen vegetables (raw, steamed, roasted, or grilled). Low-sodium or reduced-sodium tomato and vegetable juice. Low-sodium or reduced-sodium tomato sauce and tomato paste. Low-sodium or reduced-sodium canned vegetables. Fruits All fresh, dried, or frozen fruit. Canned fruit in natural juice (without  added sugar). Meat and other protein foods Skinless chicken or turkey. Ground chicken or turkey. Pork with fat trimmed off. Fish and seafood. Egg whites. Dried beans, peas, or lentils. Unsalted nuts, nut butters, and seeds. Unsalted canned beans. Lean cuts of beef with fat trimmed off. Low-sodium, lean deli meat. Dairy Low-fat (1%) or fat-free (skim) milk. Fat-free, low-fat, or reduced-fat cheeses. Nonfat, low-sodium ricotta or cottage cheese. Low-fat or nonfat yogurt. Low-fat, low-sodium cheese. Fats and oils Soft margarine without trans fats. Vegetable oil. Low-fat, reduced-fat, or light mayonnaise and salad dressings (reduced-sodium). Canola, safflower, olive, soybean, and sunflower oils. Avocado. Seasoning and other foods Herbs. Spices. Seasoning mixes without salt. Unsalted popcorn and pretzels. Fat-free sweets. What foods are not recommended? The items listed may not be a complete list. Talk with your dietitian about what dietary choices are best for you. Grains Baked goods made with fat, such as croissants, muffins, or some breads. Dry pasta or rice meal packs. Vegetables Creamed or fried vegetables. Vegetables in a cheese sauce. Regular canned vegetables (not low-sodium or reduced-sodium). Regular canned tomato sauce and paste (not low-sodium or reduced-sodium). Regular tomato and vegetable juice (not low-sodium or reduced-sodium). Pickles. Olives. Fruits Canned fruit in a light or heavy syrup. Fried fruit. Fruit in cream or butter sauce. Meat and other protein foods Fatty cuts of meat. Ribs. Fried meat. Bacon. Sausage. Bologna and other processed lunch meats. Salami. Fatback. Hotdogs. Bratwurst. Salted nuts and seeds. Canned beans with added salt. Canned or smoked fish. Whole eggs or egg yolks. Chicken or turkey with skin. Dairy Whole or 2% milk, cream, and half-and-half. Whole or full-fat cream cheese. Whole-fat or sweetened yogurt. Full-fat cheese. Nondairy creamers. Whipped toppings.  Processed cheese and cheese spreads. Fats and oils Butter. Stick margarine. Lard. Shortening. Ghee. Bacon fat. Tropical oils, such as coconut, palm kernel, or palm oil. Seasoning and other foods Salted popcorn and pretzels. Onion salt, garlic salt, seasoned salt, table salt, and sea salt. Worcestershire sauce. Tartar sauce. Barbecue sauce. Teriyaki sauce. Soy sauce, including reduced-sodium. Steak sauce. Canned and packaged gravies. Fish sauce. Oyster sauce. Cocktail sauce. Horseradish that you find on the shelf. Ketchup. Mustard. Meat flavorings and tenderizers. Bouillon cubes. Hot sauce and Tabasco sauce. Premade or packaged marinades. Premade or packaged taco seasonings. Relishes. Regular salad dressings. Where to find more information:  National Heart, Lung, and Blood Institute: www.nhlbi.nih.gov  American Heart Association: www.heart.org Summary  The DASH eating plan is a healthy eating plan that has been shown to reduce high blood pressure (hypertension). It may also reduce your risk for type 2 diabetes, heart disease, and stroke.  With the DASH eating plan, you should limit salt (sodium) intake to 2,300 mg a day. If you have hypertension, you may need to reduce your sodium intake to 1,500 mg a day.  When on the DASH eating plan, aim to eat more fresh fruits and vegetables, whole grains, lean proteins, low-fat dairy, and heart-healthy fats.  Work with your health care provider or diet and nutrition specialist (dietitian) to adjust your eating plan to your   individual calorie needs. This information is not intended to replace advice given to you by your health care provider. Make sure you discuss any questions you have with your health care provider. Document Revised: 08/23/2017 Document Reviewed: 09/03/2016 Elsevier Patient Education  2020 Elsevier Inc.  

## 2020-02-16 NOTE — Assessment & Plan Note (Addendum)
Chronic, ongoing.  Reports good tolerance with metformin, will continue 1000 mg daily for now and recheck A1c at next appointment.  Foot exam done today.  Patient declines podiatrist and eye doctor for now.  CMP and CBC rechecked today.  Follow up in 4 weeks.

## 2020-02-16 NOTE — Assessment & Plan Note (Signed)
Chronic, ongoing, BP not at goal in clinic today and reports BP >140/90 at home.  Currently on amlodipine 5 mg, carvedilol 25 mg bid and hydralazine 25 mg q6 hours.  Will increase amlodipine dose to 10 mg and follow up in 1 month.  CBC, CMP rechecked today.

## 2020-02-16 NOTE — Progress Notes (Signed)
BP (!) 154/98   Pulse 94   Temp 97.9 F (36.6 C)   Wt 237 lb 6 oz (107.7 kg)   SpO2 99%   BMI 36.06 kg/m    Subjective:    Patient ID: Brad Graham, male    DOB: [birth year], 59 y.o.   MRN: [id number]  HPI: Brad Graham is a 59 y.o. male presenting for follow up.  Chief Complaint  Patient presents with  . Hyperlipidemia  . Hypertension  . Diabetes   HYPERTENSION / HYPERLIPIDEMIA Satisfied with current treatment? yes Duration of hypertension: chronic BP monitoring frequency: weekly BP range: 145/98 BP medication side effects: yes Past BP meds:  Duration of hyperlipidemia: chronic Cholesterol medication side effects: no Cholesterol supplements: none Past cholesterol medications: atorvastatin 20 mg Medication compliance: excellent compliance Aspirin: yes Recent stressors: no Recurrent headaches: no Visual changes: no Palpitations: no Dyspnea: no Chest pain: no Lower extremity edema: no Dizzy/lightheaded: no  DIABETES Hypoglycemic episodes:no Polydipsia/polyuria: no Visual disturbance: no Chest pain: no Paresthesias: no Glucose Monitoring: no Blood Pressure Monitoring: rarely Retinal Examination: Patient reports he will get an eye dcotor on his own Foot Exam: done today Diabetic Education: Completed  Pneumovax: Not up to Date Influenza: deferred to flu season Aspirin: yes  No Known Allergies  Outpatient Encounter Medications as of 02/16/2020  Medication Sig  . amLODipine (NORVASC) 10 MG tablet Take 1 tablet (10 mg total) by mouth daily.  Marland Kitchen aspirin EC 81 MG tablet Take 1 tablet (81 mg total) by mouth daily.  Marland Kitchen atorvastatin (LIPITOR) 20 MG tablet Take 1 tablet (20 mg total) by mouth daily at 6 PM.  . blood glucose meter kit and supplies KIT Dispense based on patient and insurance preference. Use up to four times daily as directed. (FOR ICD-9 250.00, 250.01).  . carvedilol (COREG) 25 MG tablet Take 1 tablet (25 mg total) by mouth 2 (two) times daily  with a meal.  . folic acid (FOLVITE) 1 MG tablet Take 1 tablet (1 mg total) by mouth daily.  . hydrALAZINE (APRESOLINE) 25 MG tablet Take 1 tablet (25 mg total) by mouth every 6 (six) hours.  . metFORMIN (GLUCOPHAGE) 1000 MG tablet Take 1 tablet (1,000 mg total) by mouth daily with breakfast.  . Multiple Vitamin (MULTIVITAMIN WITH MINERALS) TABS tablet Take 1 tablet by mouth daily.  . tamsulosin (FLOMAX) 0.4 MG CAPS capsule Take 1 capsule (0.4 mg total) by mouth daily after supper.  . thiamine 100 MG tablet Take 1 tablet (100 mg total) by mouth daily.  . [DISCONTINUED] amLODipine (NORVASC) 5 MG tablet Take 1 tablet (5 mg total) by mouth daily.   No facility-administered encounter medications on file as of 02/16/2020.   Patient Active Problem List   Diagnosis Date Noted  . Unresponsive episode 01/28/2020  . Seizure ([place]) 08/02/2016  . Essential hypertension 05/10/2015  . Diabetes mellitus without complication ([place]) 69/48/5462  . Hyperlipidemia 05/10/2015  . Proteinuria 05/10/2015   Past Medical History:  Diagnosis Date  . Alcohol abuse   . Arthritis   . H/O: CVA (cerebrovascular accident)   . Hyperlipidemia   . Hypertension   . Syncope 07/02/2016  . Syncope and collapse 01/27/2020   Relevant past medical, surgical, family and social history reviewed and updated as indicated. Interim medical history since our last visit reviewed.  Review of Systems  Constitutional: Negative.  Negative for activity change, appetite change, fatigue and fever.  Eyes: Negative.  Negative for visual disturbance.  Respiratory: Negative.  Negative for cough, chest tightness, shortness of breath and wheezing.   Cardiovascular: Negative.  Negative for chest pain, palpitations and leg swelling.  Gastrointestinal: Negative.  Negative for nausea and vomiting.  Skin: Negative.   Neurological: Negative.  Negative for dizziness, weakness, light-headedness, numbness and headaches.  Psychiatric/Behavioral: Negative.   Negative for behavioral problems, decreased concentration and sleep disturbance. The patient is not nervous/anxious.    Per HPI unless specifically indicated above     Objective:    BP (!) 154/98   Pulse 94   Temp 97.9 F (36.6 C)   Wt 237 lb 6 oz (107.7 kg)   SpO2 99%   BMI 36.06 kg/m   Wt Readings from Last 3 Encounters:  02/16/20 237 lb 6 oz (107.7 kg)  02/02/20 239 lb 8 oz (108.6 kg)  01/27/20 230 lb (104.3 kg)    Physical Exam Vitals and nursing note reviewed.  Constitutional:      General: He is not in acute distress.    Appearance: Normal appearance. He is normal weight.  HENT:     Mouth/Throat:     Mouth: Mucous membranes are moist.     Pharynx: No oropharyngeal exudate.  Eyes:     General: No scleral icterus.    Extraocular Movements: Extraocular movements intact.     Pupils: Pupils are equal, round, and reactive to light.  Cardiovascular:     Rate and Rhythm: Normal rate and regular rhythm.     Heart sounds: Normal heart sounds. No murmur.  Pulmonary:     Effort: Pulmonary effort is normal. No respiratory distress.     Breath sounds: Normal breath sounds. No wheezing, rhonchi or rales.  Abdominal:     General: Abdomen is flat. Bowel sounds are normal.     Palpations: Abdomen is soft.     Tenderness: There is no abdominal tenderness.  Musculoskeletal:     Cervical back: Normal range of motion. No rigidity.     Right lower leg: No edema.     Left lower leg: No edema.  Skin:    General: Skin is warm and dry.     Capillary Refill: Capillary refill takes less than 2 seconds.     Coloration: Skin is not jaundiced or pale.  Neurological:     General: No focal deficit present.     Mental Status: He is alert and oriented to person, place, and time.     Motor: No weakness.     Gait: Gait normal.  Psychiatric:        Mood and Affect: Mood normal.        Behavior: Behavior normal.        Thought Content: Thought content normal.        Judgment: Judgment  normal.       Assessment & Plan:   Problem List Items Addressed This Visit      Cardiovascular and Mediastinum   Essential hypertension    Chronic, ongoing, BP not at goal in clinic today and reports BP >140/90 at home.  Currently on amlodipine 5 mg, carvedilol 25 mg bid and hydralazine 25 mg q6 hours.  Will increase amlodipine dose to 10 mg and follow up in 1 month.  CBC, CMP rechecked today.      Relevant Medications   amLODipine (NORVASC) 10 MG tablet   Other Relevant Orders   CBC with Differential/Platelet   Comprehensive metabolic panel     Endocrine   Diabetes mellitus without complication ([place])  Chronic, ongoing.  Reports good tolerance with metformin, will continue 1000 mg daily for now and recheck A1c at next appointment.  Foot exam done today.  Patient declines podiatrist and eye doctor for now.  CMP and CBC rechecked today.  Follow up in 4 weeks.        Other   Hyperlipidemia - Primary    Chronic, ongoing.  Reports good tolerance to restarting statin.  Will continue at current dose and recheck lipids in about 6 months.      Relevant Medications   amLODipine (NORVASC) 10 MG tablet   Other Relevant Orders   Comprehensive metabolic panel    Other Visit Diagnoses    Low hemoglobin       Relevant Orders   CBC with Differential/Platelet   Iron, TIBC and Ferritin Panel   Folate   Vitamin B12       Follow up plan: Return in about 4 weeks (around 03/15/2020) for HTN/HLD/DM follow up.

## 2020-02-16 NOTE — Assessment & Plan Note (Signed)
Chronic, ongoing.  Reports good tolerance to restarting statin.  Will continue at current dose and recheck lipids in about 6 months.

## 2020-02-17 LAB — CBC WITH DIFFERENTIAL/PLATELET
Basophils Absolute: 0.1 10*3/uL (ref 0.0–0.2)
Basos: 1 %
EOS (ABSOLUTE): 0 10*3/uL (ref 0.0–0.4)
Eos: 1 %
Hematocrit: 35 % — ABNORMAL LOW (ref 37.5–51.0)
Hemoglobin: 11.7 g/dL — ABNORMAL LOW (ref 13.0–17.7)
Immature Grans (Abs): 0 10*3/uL (ref 0.0–0.1)
Immature Granulocytes: 1 %
Lymphocytes Absolute: 1 10*3/uL (ref 0.7–3.1)
Lymphs: 18 %
MCH: 29.5 pg (ref 26.6–33.0)
MCHC: 33.4 g/dL (ref 31.5–35.7)
MCV: 88 fL (ref 79–97)
Monocytes Absolute: 0.5 10*3/uL (ref 0.1–0.9)
Monocytes: 9 %
Neutrophils Absolute: 4.1 10*3/uL (ref 1.4–7.0)
Neutrophils: 70 %
Platelets: 192 10*3/uL (ref 150–450)
RBC: 3.96 x10E6/uL — ABNORMAL LOW (ref 4.14–5.80)
RDW: 13.3 % (ref 11.6–15.4)
WBC: 5.7 10*3/uL (ref 3.4–10.8)

## 2020-02-17 LAB — COMPREHENSIVE METABOLIC PANEL
ALT: 20 IU/L (ref 0–44)
AST: 28 IU/L (ref 0–40)
Albumin/Globulin Ratio: 1.3 (ref 1.2–2.2)
Albumin: 4.6 g/dL (ref 3.8–4.9)
Alkaline Phosphatase: 102 IU/L (ref 48–121)
BUN/Creatinine Ratio: 19 (ref 10–24)
BUN: 28 mg/dL — ABNORMAL HIGH (ref 8–27)
Bilirubin Total: 0.6 mg/dL (ref 0.0–1.2)
CO2: 17 mmol/L — ABNORMAL LOW (ref 20–29)
Calcium: 10.2 mg/dL (ref 8.6–10.2)
Chloride: 100 mmol/L (ref 96–106)
Creatinine, Ser: 1.49 mg/dL — ABNORMAL HIGH (ref 0.76–1.27)
GFR calc Af Amer: 58 mL/min/{1.73_m2} — ABNORMAL LOW (ref >59)
GFR calc non Af Amer: 50 mL/min/{1.73_m2} — ABNORMAL LOW (ref >59)
Globulin, Total: 3.6 g/dL (ref 1.5–4.5)
Glucose: 112 mg/dL — ABNORMAL HIGH (ref 65–99)
Potassium: 4.9 mmol/L (ref 3.5–5.2)
Sodium: 137 mmol/L (ref 134–144)
Total Protein: 8.2 g/dL (ref 6.0–8.5)

## 2020-02-17 LAB — IRON,TIBC AND FERRITIN PANEL
Ferritin: 360 ng/mL (ref 30–400)
Iron Saturation: 47 % (ref 15–55)
Iron: 145 ug/dL (ref 38–169)
Total Iron Binding Capacity: 310 ug/dL (ref 250–450)
UIBC: 165 ug/dL (ref 111–343)

## 2020-02-17 LAB — FOLATE: Folate: >20 ng/mL (ref >3.0)

## 2020-02-17 LAB — VITAMIN B12: Vitamin B-12: 326 pg/mL (ref 232–1245)

## 2020-03-02 ENCOUNTER — Inpatient Hospital Stay
Admission: EM | Admit: 2020-03-02 | Discharge: 2020-03-14 | DRG: 871 | Disposition: A | Discharge status: Skilled Nursing Facility | Payer: Medicaid Other | Attending: Internal Medicine | Admitting: Internal Medicine

## 2020-03-02 ENCOUNTER — Other Ambulatory Visit: Payer: Self-pay

## 2020-03-02 ENCOUNTER — Inpatient Hospital Stay: Payer: Medicaid Other

## 2020-03-02 ENCOUNTER — Emergency Department: Payer: Medicaid Other

## 2020-03-02 DIAGNOSIS — Z8673 Personal history of transient ischemic attack (TIA), and cerebral infarction without residual deficits: Secondary | ICD-10-CM

## 2020-03-02 DIAGNOSIS — Z515 Encounter for palliative care: Secondary | ICD-10-CM

## 2020-03-02 DIAGNOSIS — E1165 Type 2 diabetes mellitus with hyperglycemia: Secondary | ICD-10-CM | POA: Diagnosis present

## 2020-03-02 DIAGNOSIS — E785 Hyperlipidemia, unspecified: Secondary | ICD-10-CM | POA: Diagnosis present

## 2020-03-02 DIAGNOSIS — R739 Hyperglycemia, unspecified: Secondary | ICD-10-CM | POA: Diagnosis not present

## 2020-03-02 DIAGNOSIS — R570 Cardiogenic shock: Secondary | ICD-10-CM | POA: Diagnosis present

## 2020-03-02 DIAGNOSIS — Z452 Encounter for adjustment and management of vascular access device: Secondary | ICD-10-CM

## 2020-03-02 DIAGNOSIS — G40409 Other generalized epilepsy and epileptic syndromes, not intractable, without status epilepticus: Secondary | ICD-10-CM | POA: Diagnosis present

## 2020-03-02 DIAGNOSIS — I469 Cardiac arrest, cause unspecified: Secondary | ICD-10-CM

## 2020-03-02 DIAGNOSIS — R6521 Severe sepsis with septic shock: Secondary | ICD-10-CM | POA: Diagnosis present

## 2020-03-02 DIAGNOSIS — L89899 Pressure ulcer of other site, unspecified stage: Secondary | ICD-10-CM | POA: Diagnosis not present

## 2020-03-02 DIAGNOSIS — Z7189 Other specified counseling: Secondary | ICD-10-CM

## 2020-03-02 DIAGNOSIS — G9340 Encephalopathy, unspecified: Secondary | ICD-10-CM

## 2020-03-02 DIAGNOSIS — R7881 Bacteremia: Secondary | ICD-10-CM | POA: Diagnosis not present

## 2020-03-02 DIAGNOSIS — J9601 Acute respiratory failure with hypoxia: Secondary | ICD-10-CM | POA: Diagnosis present

## 2020-03-02 DIAGNOSIS — E872 Acidosis, unspecified: Secondary | ICD-10-CM

## 2020-03-02 DIAGNOSIS — D649 Anemia, unspecified: Secondary | ICD-10-CM | POA: Diagnosis present

## 2020-03-02 DIAGNOSIS — R001 Bradycardia, unspecified: Secondary | ICD-10-CM | POA: Diagnosis present

## 2020-03-02 DIAGNOSIS — I4891 Unspecified atrial fibrillation: Secondary | ICD-10-CM | POA: Diagnosis present

## 2020-03-02 DIAGNOSIS — I10 Essential (primary) hypertension: Secondary | ICD-10-CM | POA: Diagnosis present

## 2020-03-02 DIAGNOSIS — G40909 Epilepsy, unspecified, not intractable, without status epilepticus: Secondary | ICD-10-CM | POA: Diagnosis not present

## 2020-03-02 DIAGNOSIS — R4781 Slurred speech: Secondary | ICD-10-CM | POA: Diagnosis present

## 2020-03-02 DIAGNOSIS — D696 Thrombocytopenia, unspecified: Secondary | ICD-10-CM | POA: Diagnosis present

## 2020-03-02 DIAGNOSIS — R569 Unspecified convulsions: Secondary | ICD-10-CM | POA: Diagnosis not present

## 2020-03-02 DIAGNOSIS — G92 Toxic encephalopathy: Secondary | ICD-10-CM | POA: Diagnosis present

## 2020-03-02 DIAGNOSIS — Z833 Family history of diabetes mellitus: Secondary | ICD-10-CM | POA: Diagnosis not present

## 2020-03-02 DIAGNOSIS — Z20822 Contact with and (suspected) exposure to covid-19: Secondary | ICD-10-CM | POA: Diagnosis present

## 2020-03-02 DIAGNOSIS — Z8249 Family history of ischemic heart disease and other diseases of the circulatory system: Secondary | ICD-10-CM | POA: Diagnosis not present

## 2020-03-02 DIAGNOSIS — J9602 Acute respiratory failure with hypercapnia: Secondary | ICD-10-CM | POA: Diagnosis present

## 2020-03-02 DIAGNOSIS — E875 Hyperkalemia: Secondary | ICD-10-CM | POA: Diagnosis present

## 2020-03-02 DIAGNOSIS — N179 Acute kidney failure, unspecified: Secondary | ICD-10-CM

## 2020-03-02 DIAGNOSIS — Z823 Family history of stroke: Secondary | ICD-10-CM

## 2020-03-02 DIAGNOSIS — Z6836 Body mass index (BMI) 36.0-36.9, adult: Secondary | ICD-10-CM

## 2020-03-02 DIAGNOSIS — J690 Pneumonitis due to inhalation of food and vomit: Secondary | ICD-10-CM | POA: Diagnosis present

## 2020-03-02 DIAGNOSIS — Z66 Do not resuscitate: Secondary | ICD-10-CM | POA: Diagnosis not present

## 2020-03-02 DIAGNOSIS — N400 Enlarged prostate without lower urinary tract symptoms: Secondary | ICD-10-CM | POA: Diagnosis present

## 2020-03-02 DIAGNOSIS — A419 Sepsis, unspecified organism: Secondary | ICD-10-CM | POA: Diagnosis present

## 2020-03-02 DIAGNOSIS — F17210 Nicotine dependence, cigarettes, uncomplicated: Secondary | ICD-10-CM | POA: Diagnosis present

## 2020-03-02 DIAGNOSIS — F1010 Alcohol abuse, uncomplicated: Secondary | ICD-10-CM | POA: Diagnosis present

## 2020-03-02 DIAGNOSIS — N171 Acute kidney failure with acute cortical necrosis: Secondary | ICD-10-CM | POA: Diagnosis not present

## 2020-03-02 DIAGNOSIS — Z01818 Encounter for other preprocedural examination: Secondary | ICD-10-CM

## 2020-03-02 LAB — BLOOD GAS, ARTERIAL
Acid-base deficit: 22.6 mmol/L — ABNORMAL HIGH (ref 0.0–2.0)
Bicarbonate: 8.6 mmol/L — ABNORMAL LOW (ref 20.0–28.0)
FIO2: 1
MECHVT: 500 mL
Mechanical Rate: 20
O2 Saturation: 99.9 %
PEEP: 5 cmH2O
Patient temperature: 37
pCO2 arterial: 40 mmHg (ref 32.0–48.0)
pH, Arterial: 6.94 — CL (ref 7.350–7.450)
pO2, Arterial: 395 mmHg — ABNORMAL HIGH (ref 83.0–108.0)

## 2020-03-02 LAB — COMPREHENSIVE METABOLIC PANEL
ALT: 26 U/L (ref 0–44)
AST: 38 U/L (ref 15–41)
Albumin: 3.6 g/dL (ref 3.5–5.0)
Alkaline Phosphatase: 79 U/L (ref 38–126)
Anion gap: 26 — ABNORMAL HIGH (ref 5–15)
BUN: 56 mg/dL — ABNORMAL HIGH (ref 6–20)
CO2: 7 mmol/L — ABNORMAL LOW (ref 22–32)
Calcium: 8.6 mg/dL — ABNORMAL LOW (ref 8.9–10.3)
Chloride: 103 mmol/L (ref 98–111)
Creatinine, Ser: 3.95 mg/dL — ABNORMAL HIGH (ref 0.61–1.24)
GFR calc Af Amer: 18 mL/min — ABNORMAL LOW (ref >60)
GFR calc non Af Amer: 15 mL/min — ABNORMAL LOW (ref >60)
Glucose, Bld: 177 mg/dL — ABNORMAL HIGH (ref 70–99)
Potassium: 6.4 mmol/L (ref 3.5–5.1)
Sodium: 136 mmol/L (ref 135–145)
Total Bilirubin: 1.7 mg/dL — ABNORMAL HIGH (ref 0.3–1.2)
Total Protein: 7.1 g/dL (ref 6.5–8.1)

## 2020-03-02 LAB — CBC WITH DIFFERENTIAL/PLATELET
Abs Immature Granulocytes: 0.04 10*3/uL (ref 0.00–0.07)
Basophils Absolute: 0.1 10*3/uL (ref 0.0–0.1)
Basophils Relative: 1 %
Eosinophils Absolute: 0.5 10*3/uL (ref 0.0–0.5)
Eosinophils Relative: 7 %
HCT: 32.6 % — ABNORMAL LOW (ref 39.0–52.0)
Hemoglobin: 9.9 g/dL — ABNORMAL LOW (ref 13.0–17.0)
Immature Granulocytes: 1 %
Lymphocytes Relative: 22 %
Lymphs Abs: 1.8 10*3/uL (ref 0.7–4.0)
MCH: 29.8 pg (ref 26.0–34.0)
MCHC: 30.4 g/dL (ref 30.0–36.0)
MCV: 98.2 fL (ref 80.0–100.0)
Monocytes Absolute: 0.6 10*3/uL (ref 0.1–1.0)
Monocytes Relative: 8 %
Neutro Abs: 5 10*3/uL (ref 1.7–7.7)
Neutrophils Relative %: 61 %
Platelets: 171 10*3/uL (ref 150–400)
RBC: 3.32 MIL/uL — ABNORMAL LOW (ref 4.22–5.81)
RDW: 14.5 % (ref 11.5–15.5)
WBC: 8 10*3/uL (ref 4.0–10.5)
nRBC: 0.3 % — ABNORMAL HIGH (ref 0.0–0.2)

## 2020-03-02 LAB — BASIC METABOLIC PANEL
Anion gap: 19 — ABNORMAL HIGH (ref 5–15)
Anion gap: 18 — ABNORMAL HIGH (ref 5–15)
BUN: 55 mg/dL — ABNORMAL HIGH (ref 6–20)
BUN: 57 mg/dL — ABNORMAL HIGH (ref 6–20)
CO2: 11 mmol/L — ABNORMAL LOW (ref 22–32)
CO2: 15 mmol/L — ABNORMAL LOW (ref 22–32)
Calcium: 7.7 mg/dL — ABNORMAL LOW (ref 8.9–10.3)
Calcium: 7.2 mg/dL — ABNORMAL LOW (ref 8.9–10.3)
Chloride: 106 mmol/L (ref 98–111)
Chloride: 106 mmol/L (ref 98–111)
Creatinine, Ser: 3.93 mg/dL — ABNORMAL HIGH (ref 0.61–1.24)
Creatinine, Ser: 3.62 mg/dL — ABNORMAL HIGH (ref 0.61–1.24)
GFR calc Af Amer: 18 mL/min — ABNORMAL LOW (ref >60)
GFR calc Af Amer: 20 mL/min — ABNORMAL LOW (ref >60)
GFR calc non Af Amer: 16 mL/min — ABNORMAL LOW (ref >60)
GFR calc non Af Amer: 17 mL/min — ABNORMAL LOW (ref >60)
Glucose, Bld: 189 mg/dL — ABNORMAL HIGH (ref 70–99)
Glucose, Bld: 225 mg/dL — ABNORMAL HIGH (ref 70–99)
Potassium: 6 mmol/L — ABNORMAL HIGH (ref 3.5–5.1)
Potassium: 6.9 mmol/L (ref 3.5–5.1)
Sodium: 136 mmol/L (ref 135–145)
Sodium: 139 mmol/L (ref 135–145)

## 2020-03-02 LAB — ETHANOL: Alcohol, Ethyl (B): <10 mg/dL (ref <10)

## 2020-03-02 LAB — SARS CORONAVIRUS 2 BY RT PCR (HOSPITAL ORDER, PERFORMED IN ~~LOC~~ HOSPITAL LAB): SARS Coronavirus 2: NEGATIVE

## 2020-03-02 LAB — LACTIC ACID, PLASMA
Lactic Acid, Venous: 4.5 mmol/L (ref 0.5–1.9)
Lactic Acid, Venous: 1.4 mmol/L (ref 0.5–1.9)

## 2020-03-02 LAB — MRSA PCR SCREENING: MRSA by PCR: NEGATIVE

## 2020-03-02 LAB — MAGNESIUM: Magnesium: 1.8 mg/dL (ref 1.7–2.4)

## 2020-03-02 LAB — TROPONIN I (HIGH SENSITIVITY): Troponin I (High Sensitivity): 8 ng/L (ref <18)

## 2020-03-02 LAB — PHOSPHORUS: Phosphorus: 9.4 mg/dL — ABNORMAL HIGH (ref 2.5–4.6)

## 2020-03-02 MED ORDER — ATORVASTATIN CALCIUM 20 MG PO TABS: 20.0000 mg | ORAL_TABLET | Freq: Every day | ORAL | Status: DC

## 2020-03-02 MED ORDER — SODIUM CHLORIDE 0.9 % IV SOLN
250.0000 mg | Freq: Two times a day (BID) | INTRAVENOUS | Status: DC
  Administered 2020-03-03 – 2020-03-07 (×8): 250 mg via INTRAVENOUS
  Filled 2020-03-02 (×11): qty 5

## 2020-03-02 MED ORDER — NOREPINEPHRINE 4 MG/250ML-% IV SOLN
INTRAVENOUS | Status: AC
  Administered 2020-03-02: 5 ug/min via INTRAVENOUS
  Filled 2020-03-02: qty 250

## 2020-03-02 MED ORDER — FENTANYL 2500MCG IN NS 250ML (10MCG/ML) PREMIX INFUSION
50.0000 ug/h | INTRAVENOUS | Status: DC
  Administered 2020-03-03: 50 ug/h via INTRAVENOUS
  Filled 2020-03-02 (×2): qty 250

## 2020-03-02 MED ORDER — SODIUM BICARBONATE 8.4 % IV SOLN
INTRAVENOUS | Status: AC
  Filled 2020-03-02: qty 50

## 2020-03-02 MED ORDER — CHLORHEXIDINE GLUCONATE CLOTH 2 % EX PADS
6.0000 | MEDICATED_PAD | Freq: Every day | CUTANEOUS | Status: DC
  Administered 2020-03-02 – 2020-03-03 (×2): 6 via TOPICAL
  Filled 2020-03-02 (×2): qty 6

## 2020-03-02 MED ORDER — SODIUM CHLORIDE 0.9 % IV BOLUS
1000.0000 mL | Freq: Once | INTRAVENOUS | Status: AC
  Administered 2020-03-02: 1000 mL via INTRAVENOUS

## 2020-03-02 MED ORDER — NOREPINEPHRINE 4 MG/250ML-% IV SOLN
0.0000 ug/min | INTRAVENOUS | Status: DC
  Administered 2020-03-02 (×2): 50 ug/min via INTRAVENOUS
  Filled 2020-03-02 (×2): qty 250

## 2020-03-02 MED ORDER — SODIUM CHLORIDE 0.9 % IV SOLN
600.0000 mg | Freq: Three times a day (TID) | INTRAVENOUS | Status: AC
  Administered 2020-03-02 – 2020-03-03 (×3): 600 mg via INTRAVENOUS
  Filled 2020-03-02 (×4): qty 12

## 2020-03-02 MED ORDER — POLYETHYLENE GLYCOL 3350 17 G PO PACK: 17.0000 g | PACK | Freq: Every day | ORAL | Status: DC | PRN

## 2020-03-02 MED ORDER — PROPOFOL 1000 MG/100ML IV EMUL: 5.0000 ug/kg/min | INTRAVENOUS | Status: DC

## 2020-03-02 MED ORDER — DEXTROSE 50 % IV SOLN
12.5000 g | Freq: Once | INTRAVENOUS | Status: AC
  Administered 2020-03-02: 12.5 g via INTRAVENOUS
  Filled 2020-03-02: qty 50

## 2020-03-02 MED ORDER — VECURONIUM BROMIDE 10 MG IV SOLR
10.0000 mg | Freq: Once | INTRAVENOUS | Status: AC
  Administered 2020-03-02: 10 mg via INTRAVENOUS

## 2020-03-02 MED ORDER — EPINEPHRINE 1 MG/10ML IJ SOSY
1.0000 mg | PREFILLED_SYRINGE | Freq: Once | INTRAMUSCULAR | Status: AC
  Administered 2020-03-02: 1 mg via INTRAVENOUS

## 2020-03-02 MED ORDER — MIDAZOLAM HCL 2 MG/2ML IJ SOLN: 2.0000 mg | INTRAMUSCULAR | Status: DC | PRN

## 2020-03-02 MED ORDER — ETOMIDATE 2 MG/ML IV SOLN
20.0000 mg | Freq: Once | INTRAVENOUS | Status: AC
  Administered 2020-03-02: 20 mg via INTRAVENOUS

## 2020-03-02 MED ORDER — PHENYLEPHRINE CONCENTRATED 100MG/250ML (0.4 MG/ML) INFUSION SIMPLE
0.0000 ug/min | INTRAVENOUS | Status: DC
  Administered 2020-03-03: 130 ug/min via INTRAVENOUS
  Administered 2020-03-03: 20 ug/min via INTRAVENOUS
  Administered 2020-03-03 (×3): 400 ug/min via INTRAVENOUS
  Filled 2020-03-02 (×6): qty 250

## 2020-03-02 MED ORDER — SODIUM CHLORIDE 0.9 % IV SOLN
250.0000 mL | INTRAVENOUS | Status: DC
  Administered 2020-03-02 – 2020-03-07 (×6): 250 mL via INTRAVENOUS

## 2020-03-02 MED ORDER — NOREPINEPHRINE 16 MG/250ML-% IV SOLN
0.0000 ug/min | INTRAVENOUS | Status: DC
  Administered 2020-03-03: 50 ug/min via INTRAVENOUS
  Administered 2020-03-03: 40 ug/min via INTRAVENOUS
  Administered 2020-03-03: 50 ug/min via INTRAVENOUS
  Filled 2020-03-02 (×4): qty 250

## 2020-03-02 MED ORDER — DOPAMINE-DEXTROSE 3.2-5 MG/ML-% IV SOLN
0.0000 ug/kg/min | INTRAVENOUS | Status: DC
  Administered 2020-03-03: 20 ug/kg/min via INTRAVENOUS
  Administered 2020-03-03: 5 ug/kg/min via INTRAVENOUS
  Administered 2020-03-03: 15 ug/kg/min via INTRAVENOUS
  Administered 2020-03-03: 20 ug/kg/min via INTRAVENOUS
  Administered 2020-03-04: 15 ug/kg/min via INTRAVENOUS
  Filled 2020-03-02 (×5): qty 250

## 2020-03-02 MED ORDER — FAMOTIDINE IN NACL 20-0.9 MG/50ML-% IV SOLN
20.0000 mg | Freq: Two times a day (BID) | INTRAVENOUS | Status: DC
  Administered 2020-03-02 – 2020-03-03 (×2): 20 mg via INTRAVENOUS
  Filled 2020-03-02 (×2): qty 50

## 2020-03-02 MED ORDER — SODIUM CHLORIDE 0.9 % IV SOLN: Freq: Once | INTRAVENOUS | Status: DC

## 2020-03-02 MED ORDER — ADULT MULTIVITAMIN LIQUID CH
15.0000 mL | Freq: Every day | ORAL | Status: DC
  Administered 2020-03-03: 15 mL
  Filled 2020-03-02: qty 15

## 2020-03-02 MED ORDER — THIAMINE HCL 100 MG/ML IJ SOLN
500.0000 mg | Freq: Every day | INTRAVENOUS | Status: DC
  Administered 2020-03-03 (×2): 500 mg via INTRAVENOUS
  Filled 2020-03-02 (×2): qty 5

## 2020-03-02 MED ORDER — PROPOFOL 1000 MG/100ML IV EMUL
INTRAVENOUS | Status: AC
  Filled 2020-03-02: qty 100

## 2020-03-02 MED ORDER — DOCUSATE SODIUM 50 MG/5ML PO LIQD
100.0000 mg | Freq: Two times a day (BID) | ORAL | Status: DC
  Administered 2020-03-02 – 2020-03-03 (×2): 100 mg
  Filled 2020-03-02 (×2): qty 10

## 2020-03-02 MED ORDER — VASOPRESSIN 20 UNIT/ML IV SOLN
0.0400 [IU]/min | INTRAVENOUS | Status: DC
  Administered 2020-03-02: 0.03 [IU]/min via INTRAVENOUS
  Administered 2020-03-03: 0.04 [IU]/min via INTRAVENOUS
  Filled 2020-03-02 (×3): qty 2

## 2020-03-02 MED ORDER — SODIUM CHLORIDE 0.9% FLUSH: 10.0000 mL | INTRAVENOUS | Status: DC | PRN

## 2020-03-02 MED ORDER — SODIUM BICARBONATE 8.4 % IV SOLN
50.0000 meq | Freq: Once | INTRAVENOUS | Status: AC
  Administered 2020-03-02: 50 meq via INTRAVENOUS
  Filled 2020-03-02: qty 50

## 2020-03-02 MED ORDER — FENTANYL CITRATE (PF) 100 MCG/2ML IJ SOLN
50.0000 ug | Freq: Once | INTRAMUSCULAR | Status: AC
  Administered 2020-03-03: 50 ug via INTRAVENOUS
  Filled 2020-03-02: qty 2

## 2020-03-02 MED ORDER — INSULIN ASPART 100 UNIT/ML IV SOLN
10.0000 [IU] | Freq: Once | INTRAVENOUS | Status: AC
  Administered 2020-03-02: 10 [IU] via INTRAVENOUS
  Filled 2020-03-02: qty 0.1

## 2020-03-02 MED ORDER — LORAZEPAM 2 MG/ML IJ SOLN
INTRAMUSCULAR | Status: AC
  Administered 2020-03-02: 2 mg via INTRAVENOUS
  Filled 2020-03-02: qty 1

## 2020-03-02 MED ORDER — ONDANSETRON HCL 4 MG/2ML IJ SOLN: 4.0000 mg | Freq: Four times a day (QID) | INTRAMUSCULAR | Status: DC | PRN

## 2020-03-02 MED ORDER — SODIUM BICARBONATE 8.4 % IV SOLN
100.0000 meq | Freq: Once | INTRAVENOUS | Status: AC
  Administered 2020-03-02: 100 meq via INTRAVENOUS
  Filled 2020-03-02: qty 50

## 2020-03-02 MED ORDER — STERILE WATER FOR INJECTION IV SOLN
INTRAVENOUS | Status: DC
  Filled 2020-03-02 (×4): qty 850
  Filled 2020-03-02: qty 150
  Filled 2020-03-02: qty 850
  Filled 2020-03-02: qty 150

## 2020-03-02 MED ORDER — FOLIC ACID 5 MG/ML IJ SOLN
1.0000 mg | Freq: Every day | INTRAMUSCULAR | Status: DC
  Administered 2020-03-03: 1 mg via INTRAVENOUS
  Filled 2020-03-02 (×2): qty 0.2

## 2020-03-02 MED ORDER — SODIUM CHLORIDE 0.9% FLUSH
10.0000 mL | Freq: Two times a day (BID) | INTRAVENOUS | Status: DC
  Administered 2020-03-03 – 2020-03-06 (×5): 10 mL

## 2020-03-02 MED ORDER — SUCCINYLCHOLINE CHLORIDE 20 MG/ML IJ SOLN
100.0000 mg | Freq: Once | INTRAMUSCULAR | Status: AC
  Administered 2020-03-02: 100 mg via INTRAVENOUS

## 2020-03-02 MED ORDER — CALCIUM GLUCONATE-NACL 1-0.675 GM/50ML-% IV SOLN
1.0000 g | Freq: Once | INTRAVENOUS | Status: AC
  Administered 2020-03-02: 1000 mg via INTRAVENOUS
  Filled 2020-03-02: qty 50

## 2020-03-02 MED ORDER — FENTANYL BOLUS VIA INFUSION
50.0000 ug | INTRAVENOUS | Status: DC | PRN
  Filled 2020-03-02: qty 50

## 2020-03-02 MED ORDER — HEPARIN SODIUM (PORCINE) 5000 UNIT/ML IJ SOLN
5000.0000 [IU] | Freq: Three times a day (TID) | INTRAMUSCULAR | Status: DC
  Administered 2020-03-02: 5000 [IU] via SUBCUTANEOUS
  Filled 2020-03-02: qty 1

## 2020-03-02 MED ORDER — LORAZEPAM 2 MG/ML IJ SOLN: 2.0000 mg | Freq: Once | INTRAMUSCULAR | Status: AC

## 2020-03-02 NOTE — H&P (Addendum)
Name: Brad Graham MRN: [id number] DOB: [birth year]    ADMISSION DATE:  03/02/2020 CONSULTATION DATE: 03/02/2020  REFERRING MD : Dr. Jimmye Norman   CHIEF COMPLAINT: Altered Mental Status   BRIEF PATIENT DESCRIPTION:  59 yo male admitted s/p PEA arrest following a grand mal seizure with acute renal failure with hyperkalemia, severe metabolic acidosis, and cardiogenic shock requiring mechanical intubation and vasopressors   SIGNIFICANT EVENTS/STUDIES:  06/9: Pt admitted to ICU mechanically intubated following a PEA arrest 06/9: CT Head revealed   HISTORY OF PRESENT ILLNESS:   This is a 59 yo male with a PMH of Syncopal Episode, HTN, HLD, CVA, Recently Discharged from [facility] 01/28/2020 following treatment of unresponsiveness secondary to seizure sctivity suspected due to ETOH withdrawal (pt was not discharged on anticonvulsants and pt refused MRI Brain during hospitalization), Arthritis, and Severe ETOH Abuse.  He presented to [facility] ER from a [place] via EMS on 06/9 with altered mental status.  Per ER notes when EMS arrived at pts residence he was initially awake, but then began having agonal respirations followed by a grand mal seizure.  Initial cardiac rhythm PEA, therefore ACLS protocol initiated with ROSC following 2-3 minutes of CPR.  Upon arrival to the ER pt noted to have agonal respirations requiring mechanical intubation.  Per ER physician following intubation pt began opening his eyes and purposeful movement present (attempted to follow commands). ER vital signs were: bp 61/48, hr 79, rr 21, PO2 92%, and temp 87 degrees F.  Lab results were: K+ 6.4, CO2 7, BUN 56, creatinine 3.95, glucose 177, anion gap 26, lactic acid 4.5, hgb 9.9, alcohol level <10, and abg pH 6.94/pCO2 40/pO2 395/acid-base deficit 22.6/bicarb 8.6.  CXR and COVID-19 negative.  ER EKG: atrial fibrillation, hr 96 bpm, slight ST depression.    PCCM team contacted by ER provider for additional workup and treatment.    PAST  MEDICAL HISTORY :   has a past medical history of Alcohol abuse, Arthritis, H/O: CVA (cerebrovascular accident), Hyperlipidemia, Hypertension, Syncope (07/02/2016), and Syncope and collapse (01/27/2020).  has a past surgical history that includes none. Prior to Admission medications   Medication Sig Start Date End Date Taking? Authorizing Provider  amLODipine (NORVASC) 10 MG tablet Take 1 tablet (10 mg total) by mouth daily. 02/16/20  Yes Carnella Guadalajara I, NP  aspirin EC 81 MG tablet Take 1 tablet (81 mg total) by mouth daily. 02/02/20  Yes Carnella Guadalajara I, NP  atorvastatin (LIPITOR) 20 MG tablet Take 1 tablet (20 mg total) by mouth daily at 6 PM. 02/02/20  Yes Carnella Guadalajara I, NP  blood glucose meter kit and supplies KIT Dispense based on patient and insurance preference. Use up to four times daily as directed. (FOR ICD-9 250.00, 250.01). 01/28/20  Yes Swayze, Ava, DO  carvedilol (COREG) 25 MG tablet Take 1 tablet (25 mg total) by mouth 2 (two) times daily with a meal. 02/02/20  Yes Carnella Guadalajara I, NP  folic acid (FOLVITE) 1 MG tablet Take 1 tablet (1 mg total) by mouth daily. 02/02/20  Yes Carnella Guadalajara I, NP  hydrALAZINE (APRESOLINE) 25 MG tablet Take 1 tablet (25 mg total) by mouth every 6 (six) hours. 01/28/20  Yes Swayze, Ava, DO  metFORMIN (GLUCOPHAGE) 1000 MG tablet Take 1 tablet (1,000 mg total) by mouth daily with breakfast. 02/02/20 02/01/21 Yes Carnella Guadalajara I, NP  Multiple Vitamin (MULTIVITAMIN WITH MINERALS) TABS tablet Take 1 tablet by mouth daily. 02/02/20  Yes Helayne Seminole, NP  tamsulosin (FLOMAX) 0.4 MG CAPS capsule Take 1 capsule (0.4 mg total) by mouth daily after supper. 01/28/20  Yes Swayze, Ava, DO  thiamine 100 MG tablet Take 1 tablet (100 mg total) by mouth daily. 02/02/20  Yes Carnella Guadalajara I, NP   No Known Allergies  FAMILY HISTORY:  family history includes Diabetes in his mother; Heart disease in his maternal grandfather and maternal grandmother; Stroke in his mother. SOCIAL  HISTORY:  reports that he has been smoking cigarettes. He has been smoking about 0.50 packs per day. He has never used smokeless tobacco. He reports current alcohol use. He reports that he does not use drugs.  REVIEW OF SYSTEMS:   Unable to assess pt intubated   SUBJECTIVE:  Unable to assess pt intubated  VITAL SIGNS: Temp:  [87 F (30.6 C)-89.6 F (32 C)] 89.4 F (31.9 C) (06/09 2055) Pulse Rate:  [36-85] 55 (06/09 2055) Resp:  [17-21] 20 (06/09 2055) BP: (57-104)/(40-68) 81/52 (06/09 2055) SpO2:  [91 %-100 %] 100 % (06/09 2055) FiO2 (%):  [50 %-100 %] 50 % (06/09 2046) Weight:  [120.2 kg] 120.2 kg (06/09 1953)  PHYSICAL EXAMINATION: General: acutely ill appearing male, NAD mechanically intubated  Neuro: sedated, not following commands  HEENT: supple, no JVD  Cardiovascular: sinus bradycardia, no R/G Lungs: rhonchi throughout, even, non labored Abdomen: +BS x4, soft, non distended  Musculoskeletal: normal bulk and tone, no edema  Skin: intact no rashes or lesions present   Recent Labs  Lab 03/02/20 2001 03/02/20 2036  NA 136 136  K 6.4* 6.0*  CL 103 106  CO2 7* 11*  BUN 56* 55*  CREATININE 3.95* 3.93*  GLUCOSE 177* 189*   Recent Labs  Lab 03/02/20 2001  HGB 9.9*  HCT 32.6*  WBC 8.0  PLT 171   DG Abdomen 1 View  Result Date: 03/02/2020 CLINICAL DATA:  Nasogastric tube placement. EXAM: ABDOMEN - 1 VIEW COMPARISON:  None. FINDINGS: A nasogastric tube is seen with its distal tip overlying the expected region of the gastric antrum. The bowel gas pattern is normal. No radio-opaque calculi or other significant radiographic abnormality are seen. IMPRESSION: Nasogastric tube positioning, as described above. Electronically Signed   By: Virgina Norfolk M.D.   On: 03/02/2020 20:14   DG Chest Port 1 View  Result Date: 03/02/2020 CLINICAL DATA:  Status post endotracheal tube placement. EXAM: PORTABLE CHEST 1 VIEW COMPARISON:  July 14, 2016 FINDINGS: An endotracheal tube  is seen with its distal tip approximately 4.0 cm from the carina. A nasogastric tube is noted with its distal tip seen within the body of the stomach. There is no evidence of acute infiltrate, pleural effusion or pneumothorax. The cardiac silhouette is mildly enlarged. The visualized skeletal structures are unremarkable. IMPRESSION: Endotracheal tube and nasogastric tube in good position. Electronically Signed   By: Virgina Norfolk M.D.   On: 03/02/2020 20:14    ASSESSMENT / PLAN:  Acute respiratory failure s/p PEA arrest  Aspiration pneumonia  Mechanical intubation pain/discomfort  Full vent support for now-vent settings reviewed and established  SBT once all parameters met  VAP bundle implemented Prn bronchodilator therapy  Pt DOES NOT meet hypothermic protocol criteria-purposeful movement present post cardiac arrest   PEA arrest  Cardiogenic shock Bradycardia  Hx: HTN, HLD, and Syncopal episode Continuous telemetry monitoring  Aggressive fluid resuscitation; prn levophed, dopamine, and vasopressin gtts to maintain map >65 Hold antihypertensive medications Continue outpatient atorvastatin  Trend troponin's  Will r/o sepsis-will check pct and  follow blood cultures/urine cultures    Acute renal failure with hyperkalemia  Lactic acidosis and severe anion gap metabolic acidosis  Trend BMP, ABG, and lactic acid  Replace electrolytes as indicated  Monitor UOP  Sodium bicarb gtt @150  ml/hr  Avoid nephrotoxic medications  Aspiration pneumonia  Trend WBC and monitor fever curve  Trend PCT  Will start unasyn  Follow cultures   Anemia without obvious acute blood loss  Trend CBC  Monitor for s/sx of bleeding  Transfuse for hgb <7  Hyperglycemia  CBG's q4hrs  SSI   Acute encephalopathy s/p grand mal seizure and PEA arrest  Mechanical intubation pain/discomfort  Hx: ETOH Abuse and CVA  Maintain RASS goal 0 to -1 Prn fentanyl gtt and intermittent versed to maintain RASS goal   WUA daily  EEG pending  Once pts blood pressure stabilized will order stat CT Head  Monitor for s/sx of ETOH withdrawal Continue high dose thiamine, folic acid, and mvi   Best Practice: VTE px: subq heparin  SUP px: iv pepcid  Diet: will keep NPO for now, if pt remains intubated in the next 24 hrs will consult dietitian to initiate TF's  Family: no family present at bedside, family updated by ER physician   Marda Stalker, [place] Pager [phone] (please enter 7 digits) PCCM Consult Pager [phone] (please enter 7 digits)

## 2020-03-02 NOTE — ED Notes (Signed)
Pulses returned

## 2020-03-02 NOTE — ED Notes (Signed)
Pt placed in trendelenburg

## 2020-03-02 NOTE — ED Notes (Signed)
Family to bedside.

## 2020-03-02 NOTE — ED Notes (Signed)
Bladder scanned PT d/t no urine output. 57mL in bladder per bladder scanner

## 2020-03-02 NOTE — ED Notes (Signed)
PT found to be in PEA with rate of 20-40. CPR started. MD at bedside preparing to intubate

## 2020-03-02 NOTE — ED Notes (Signed)
RT to bedside for ABG

## 2020-03-02 NOTE — ED Provider Notes (Signed)
Department of Emergency Medicine     Chief Complaint: Cardiac arrest/unresponsive   Level V Caveat: Unresponsive  History of present illness: Patient brought to the ER by EMS for altered mental status.  On EMS arrival to his residence he was awake and then began having agonal breathing followed by grand mal seizure.  Reportedly he has a history of seizures.  He went to PEA and EMS performed CPR for 2 to 3 minutes with pulses returning.  Breathing was agonal on arrival.  ROS: Unable to obtain, Level V caveat  Scheduled Meds: Continuous Infusions: . norepinephrine    . propofol     PRN Meds:. Past Medical History:  Diagnosis Date  . Alcohol abuse   . Arthritis   . H/O: CVA (cerebrovascular accident)   . Hyperlipidemia   . Hypertension   . Syncope 07/02/2016  . Syncope and collapse 01/27/2020   Past Surgical History:  Procedure Laterality Date  . none     Social History   Socioeconomic History  . Marital status: Single    Spouse name: Not on file  . Number of children: Not on file  . Years of education: Not on file  . Highest education level: Not on file  Occupational History  . Not on file  Tobacco Use  . Smoking status: Current Every Day Smoker    Packs/day: 0.50    Types: Cigarettes  . Smokeless tobacco: Never Used  Substance and Sexual Activity  . Alcohol use: Yes    Comment: fifth every other day  . Drug use: No  . Sexual activity: Never  Other Topics Concern  . Not on file  Social History Narrative  . Not on file   Social Determinants of Health   Financial Resource Strain:   . Difficulty of Paying Living Expenses:   Food Insecurity:   . Worried About Programme researcher, broadcasting/film/video in the Last Year:   . Barista in the Last Year:   Transportation Needs:   . Freight forwarder (Medical):   Marland Kitchen Lack of Transportation (Non-Medical):   Physical Activity:   . Days of Exercise per Week:   . Minutes of Exercise per Session:   Stress:   . Feeling of  Stress :   Social Connections:   . Frequency of Communication with Friends and Family:   . Frequency of Social Gatherings with Friends and Family:   . Attends Religious Services:   . Active Member of Clubs or Organizations:   . Attends Banker Meetings:   Marland Kitchen Marital Status:   Intimate Partner Violence:   . Fear of Current or Ex-Partner:   . Emotionally Abused:   Marland Kitchen Physically Abused:   . Sexually Abused:    No Known Allergies  Last set of Vital Signs (not current) Vitals:   03/02/20 1952 03/02/20 1955  BP: 104/68   Pulse: 75   Resp: 17   Temp:  (!) 88.4 F (31.3 C)  SpO2: 91%       Physical Exam  Gen: unresponsive Cardiovascular: pulseless  Resp: apneic. Breath sounds equal bilaterally with bagging  Abd: Distended Neuro: GCS 3, unresponsive to pain  HEENT: No blood in posterior pharynx, gag reflex absent  Neck: No crepitus  Musculoskeletal: No deformity  Skin: warm  Procedures  INTUBATION Performed by: Ulice Dash Required items: required blood products, implants, devices, and special equipment available Patient identity confirmed: provided demographic data and hospital-assigned identification number Time out: Immediately prior to  procedure a "time out" was called to verify the correct patient, procedure, equipment, support staff and site/side marked as required. Indications: Post arrest Intubation method: Glide scope Preoxygenation: 100% BVM Sedatives: 20 mg of etomidate Paralytic: 100 mg of succinylcholine Tube Size: 7.5 cuffed Post-procedure assessment: chest rise and ETCO2 monitor Breath sounds: equal and absent over the epigastrium Tube secured by Respiratory Therapy Patient tolerated the procedure well with no immediate complications.  CRITICAL CARE Performed by: Ulice Dash Total critical care time: 30 Critical care time was exclusive of separately billable procedures and treating other patients. Critical care was  necessary to treat or prevent imminent or life-threatening deterioration. Critical care was time spent personally by me on the following activities: development of treatment plan with patient and/or surrogate as well as nursing, discussions with consultants, evaluation of patient's response to treatment, examination of patient, obtaining history from patient or surrogate, ordering and performing treatments and interventions, ordering and review of laboratory studies, ordering and review of radiographic studies, pulse oximetry and re-evaluation of patient's condition.  Labs Reviewed  LACTIC ACID, PLASMA - Abnormal; Notable for the following components:      Result Value   Lactic Acid, Venous 4.5 (*)    All other components within normal limits  COMPREHENSIVE METABOLIC PANEL - Abnormal; Notable for the following components:   Potassium 6.4 (*)    CO2 7 (*)    Glucose, Bld 177 (*)    BUN 56 (*)    Creatinine, Ser 3.95 (*)    Calcium 8.6 (*)    Total Bilirubin 1.7 (*)    GFR calc non Af Amer 15 (*)    GFR calc Af Amer 18 (*)    Anion gap 26 (*)    All other components within normal limits  CBC WITH DIFFERENTIAL/PLATELET - Abnormal; Notable for the following components:   RBC 3.32 (*)    Hemoglobin 9.9 (*)    HCT 32.6 (*)    nRBC 0.3 (*)    All other components within normal limits  BLOOD GAS, ARTERIAL - Abnormal; Notable for the following components:   pH, Arterial 6.94 (*)    pO2, Arterial 395 (*)    Bicarbonate 8.6 (*)    Acid-base deficit 22.6 (*)    All other components within normal limits  CULTURE, BLOOD (ROUTINE X 2)  CULTURE, BLOOD (ROUTINE X 2)  URINE CULTURE  LACTIC ACID, PLASMA  URINALYSIS, COMPLETE (UACMP) WITH MICROSCOPIC  ETHANOL  URINE DRUG SCREEN, QUALITATIVE ([facility] ONLY)  BASIC METABOLIC PANEL  TROPONIN I (HIGH SENSITIVITY)     Cardiopulmonary Resuscitation (CPR) Procedure Note Directed/Performed by: Ulice Dash I personally directed ancillary staff  and/or performed CPR in an effort to regain return of spontaneous circulation and to maintain cardiac, neuro and systemic perfusion.    Medical Decision making  Cardiopulmonary arrest, seizure, aspiration, hypoxia, sepsis, status epilepticus  Assessment and Plan  Cardiopulmonary arrest, acidemia, acute renal failure, hyperkalemia  Patient arrived status post arrest with return of spontaneous circulation.  He was intubated as dictated above.  After intubation and a period of ventilation patient began opening his eyes and trying to follow commands.  This was reassuring sign.  We subsequently sedated him until we can get a better understanding of his illness.  It is difficult to say what the inciting event was, likely multifactorial.  He does have marked acidemia as well as electrolyte abnormalities.  He has received numerous liters of normal saline and has been placed on Levophed  for blood pressure management.  He is also on a bear hugger for hypothermia.  I will discuss with the intensivist for admission.     Earleen Newport, MD 03/02/20 2052

## 2020-03-02 NOTE — Progress Notes (Addendum)
Pt transported to ICU 20 on vent without incident. Pt remains on the vent and is tol well at this time. Report given to ICU RT.

## 2020-03-02 NOTE — Progress Notes (Signed)
PHARMACY CONSULT NOTE - FOLLOW UP  Pharmacy Consult for Electrolyte Monitoring and Replacement   Recent Labs: Potassium (mmol/L)  Date Value  03/02/2020 6.0 (H)  05/02/2014 3.6   Magnesium (mg/dL)  Date Value  16/06/9603 1.7   Calcium (mg/dL)  Date Value  54/05/8118 7.7 (L)   Calcium, Total (mg/dL)  Date Value  14/78/2956 8.5   Albumin (g/dL)  Date Value  21/30/8657 3.6  02/16/2020 4.6  05/02/2014 3.8   Phosphorus (mg/dL)  Date Value  84/69/6295 3.9   Sodium (mmol/L)  Date Value  03/02/2020 136  02/16/2020 137  05/02/2014 126 (L)     Assessment: Ca = 7.7,  Alb = 3.6, Corrected Ca = 8.02  Goal of Therapy:  Electrolytes WNL   Plan:  Calcium gluconate 1 gm IV X 1 ordered for 6/9 @ ~ 2100.  No additional electrolyte supplementation needed .  Will recheck electrolytes on  6/10 with AM labs.   Scherrie Gerlach ,PharmD Clinical Pharmacist 03/02/2020 9:17 PM

## 2020-03-02 NOTE — ED Notes (Signed)
Spoke to MD Mayford Knife regarding BP falling despite being over max dose of levo. Orders for 1,051mL NS on pressure bag.

## 2020-03-02 NOTE — ED Notes (Signed)
PT intubated by Dr. Mayford Knife 7.5 24 @ lip

## 2020-03-02 NOTE — ED Triage Notes (Signed)
PT to ED via EMS. EMS called d/t ams. Upon EMS arrival pt was awake and then began to have agonal breathing and  Grand mal seziure with hx of same. PT went into PEA and EMS performed CPR approx 2-3 minutes with pulses returned. Breathing remained agonal upon arrival. PT has IO to left shoulder.

## 2020-03-02 NOTE — Progress Notes (Signed)
MEDICATION RELATED CONSULT NOTE - INITIAL   Pharmacy Consult for Phenytoin  Indication: Seizure (Grand Mal)   No Known Allergies  Patient Measurements: Height: 6' (182.9 cm) Weight: 120.2 kg (265 lb) IBW/kg (Calculated) : 77.6 Adjusted Body Weight:   Vital Signs: Temp: 89.4 F (31.9 C) (06/09 2055) Temp Source: Bladder (06/09 1955) BP: 81/52 (06/09 2055) Pulse Rate: 55 (06/09 2055) Intake/Output from previous day: No intake/output data recorded. Intake/Output from this shift: Total I/O In: 2000 [IV Piggyback:2000] Out: -   Labs: Recent Labs    03/02/20 2001 03/02/20 2036  WBC 8.0  --   HGB 9.9*  --   HCT 32.6*  --   PLT 171  --   CREATININE 3.95* 3.93*  ALBUMIN 3.6  --   PROT 7.1  --   AST 38  --   ALT 26  --   ALKPHOS 79  --   BILITOT 1.7*  --    Estimated Creatinine Clearance: 26.7 mL/min (A) (by C-G formula based on SCr of 3.93 mg/dL (H)).   Microbiology: No results found for this or any previous visit (from the past 720 hour(s)).  Medical History: Past Medical History:  Diagnosis Date  . Alcohol abuse   . Arthritis   . H/O: CVA (cerebrovascular accident)   . Hyperlipidemia   . Hypertension   . Syncope 07/02/2016  . Syncope and collapse 01/27/2020    Medications:  (Not in a hospital admission)   Assessment: Pharmacy consulted to dose phenytoin in this 59 year old male with history of seizures, pt had grand mal seizure on 6/9 , now resolved.   Pt was not on any anti epileptics PTA.   Goal of Therapy:  Seizure prophylaxis   Plan:  Will load pt with phenytoin 600 mg IV Q8H X 3 doses (1800 mg loading dose) followed by phenytoin 250 mg IV Q12H.   Natalie Mceuen D 03/02/2020,9:39 PM

## 2020-03-02 NOTE — Progress Notes (Signed)
eLink Physician-Brief Progress Note Patient Name: Brad Graham DOB: [birth year] MRN: [id number]   Date of Service  03/02/2020  HPI/Events of Note  Pt admitted to the ICU at [facility] following a PEA cardiac arrest, arrest was preceded by grand mal seizure. Other presenting complaints included acute respiratory failure, acute renal failure, severe metabolic acidosis, and cardiogenic shock.  eICU Interventions  New Patient Evaluation completed.        Thomasene Lot Juliza Machnik 03/02/2020, 11:01 PM

## 2020-03-03 ENCOUNTER — Encounter: Payer: Self-pay | Admitting: Internal Medicine

## 2020-03-03 ENCOUNTER — Inpatient Hospital Stay: Payer: Medicaid Other

## 2020-03-03 DIAGNOSIS — E875 Hyperkalemia: Secondary | ICD-10-CM

## 2020-03-03 DIAGNOSIS — I469 Cardiac arrest, cause unspecified: Secondary | ICD-10-CM

## 2020-03-03 DIAGNOSIS — Z515 Encounter for palliative care: Secondary | ICD-10-CM

## 2020-03-03 DIAGNOSIS — R739 Hyperglycemia, unspecified: Secondary | ICD-10-CM

## 2020-03-03 DIAGNOSIS — R570 Cardiogenic shock: Secondary | ICD-10-CM

## 2020-03-03 DIAGNOSIS — Z7189 Other specified counseling: Secondary | ICD-10-CM

## 2020-03-03 DIAGNOSIS — E872 Acidosis, unspecified: Secondary | ICD-10-CM

## 2020-03-03 DIAGNOSIS — N179 Acute kidney failure, unspecified: Secondary | ICD-10-CM

## 2020-03-03 DIAGNOSIS — G9340 Encephalopathy, unspecified: Secondary | ICD-10-CM

## 2020-03-03 HISTORY — DX: Cardiogenic shock: R57.0

## 2020-03-03 LAB — COMPREHENSIVE METABOLIC PANEL
ALT: 24 U/L (ref 0–44)
AST: 33 U/L (ref 15–41)
Albumin: 3.2 g/dL — ABNORMAL LOW (ref 3.5–5.0)
Alkaline Phosphatase: 75 U/L (ref 38–126)
Anion gap: 25 — ABNORMAL HIGH (ref 5–15)
BUN: 57 mg/dL — ABNORMAL HIGH (ref 6–20)
CO2: 15 mmol/L — ABNORMAL LOW (ref 22–32)
Calcium: 7 mg/dL — ABNORMAL LOW (ref 8.9–10.3)
Chloride: 100 mmol/L (ref 98–111)
Creatinine, Ser: 3.57 mg/dL — ABNORMAL HIGH (ref 0.61–1.24)
GFR calc Af Amer: 20 mL/min — ABNORMAL LOW (ref >60)
GFR calc non Af Amer: 17 mL/min — ABNORMAL LOW (ref >60)
Glucose, Bld: 250 mg/dL — ABNORMAL HIGH (ref 70–99)
Potassium: 4.7 mmol/L (ref 3.5–5.1)
Sodium: 140 mmol/L (ref 135–145)
Total Bilirubin: 1.8 mg/dL — ABNORMAL HIGH (ref 0.3–1.2)
Total Protein: 6.4 g/dL — ABNORMAL LOW (ref 6.5–8.1)

## 2020-03-03 LAB — BASIC METABOLIC PANEL
Anion gap: 23 — ABNORMAL HIGH (ref 5–15)
BUN: 54 mg/dL — ABNORMAL HIGH (ref 6–20)
CO2: 18 mmol/L — ABNORMAL LOW (ref 22–32)
Calcium: 6.5 mg/dL — ABNORMAL LOW (ref 8.9–10.3)
Chloride: 98 mmol/L (ref 98–111)
Creatinine, Ser: 3.35 mg/dL — ABNORMAL HIGH (ref 0.61–1.24)
GFR calc Af Amer: 22 mL/min — ABNORMAL LOW (ref >60)
GFR calc non Af Amer: 19 mL/min — ABNORMAL LOW (ref >60)
Glucose, Bld: 387 mg/dL — ABNORMAL HIGH (ref 70–99)
Potassium: 4.1 mmol/L (ref 3.5–5.1)
Sodium: 139 mmol/L (ref 135–145)

## 2020-03-03 LAB — BLOOD CULTURE ID PANEL (REFLEXED)

## 2020-03-03 LAB — CBC
HCT: 31.1 % — ABNORMAL LOW (ref 39.0–52.0)
Hemoglobin: 10.1 g/dL — ABNORMAL LOW (ref 13.0–17.0)
MCH: 29.5 pg (ref 26.0–34.0)
MCHC: 32.5 g/dL (ref 30.0–36.0)
MCV: 90.9 fL (ref 80.0–100.0)
Platelets: 143 10*3/uL — ABNORMAL LOW (ref 150–400)
RBC: 3.42 MIL/uL — ABNORMAL LOW (ref 4.22–5.81)
RDW: 13.9 % (ref 11.5–15.5)
WBC: 11.4 10*3/uL — ABNORMAL HIGH (ref 4.0–10.5)
nRBC: 0 % (ref 0.0–0.2)

## 2020-03-03 LAB — GLUCOSE, CAPILLARY
Glucose-Capillary: 187 mg/dL — ABNORMAL HIGH (ref 70–99)
Glucose-Capillary: 186 mg/dL — ABNORMAL HIGH (ref 70–99)

## 2020-03-03 LAB — URINALYSIS, COMPLETE (UACMP) WITH MICROSCOPIC
Bilirubin Urine: NEGATIVE
Glucose, UA: 50 mg/dL — AB
Ketones, ur: 5 mg/dL — AB
Nitrite: NEGATIVE
Protein, ur: 100 mg/dL — AB
Specific Gravity, Urine: 1.021 (ref 1.005–1.030)
pH: 5 (ref 5.0–8.0)

## 2020-03-03 LAB — URINE DRUG SCREEN, QUALITATIVE (ARMC ONLY)
Amphetamines, Ur Screen: NOT DETECTED
Barbiturates, Ur Screen: NOT DETECTED
Benzodiazepine, Ur Scrn: NOT DETECTED
Cannabinoid 50 Ng, Ur ~~LOC~~: NOT DETECTED
Cocaine Metabolite,Ur ~~LOC~~: NOT DETECTED
MDMA (Ecstasy)Ur Screen: NOT DETECTED
Methadone Scn, Ur: NOT DETECTED
Opiate, Ur Screen: NOT DETECTED
Phencyclidine (PCP) Ur S: NOT DETECTED
Tricyclic, Ur Screen: NOT DETECTED

## 2020-03-03 LAB — BLOOD GAS, ARTERIAL
Acid-base deficit: 13.6 mmol/L — ABNORMAL HIGH (ref 0.0–2.0)
Acid-base deficit: 16 mmol/L — ABNORMAL HIGH (ref 0.0–2.0)
Bicarbonate: 12.1 mmol/L — ABNORMAL LOW (ref 20.0–28.0)
Bicarbonate: 13 mmol/L — ABNORMAL LOW (ref 20.0–28.0)
FIO2: 0.5
FIO2: 0.6
MECHVT: 500 mL
MECHVT: 500 mL
Mechanical Rate: 20
Mechanical Rate: 20
O2 Saturation: 79.3 %
O2 Saturation: 91.8 %
PEEP: 5 cmH2O
PEEP: 5 cmH2O
Patient temperature: 37
Patient temperature: 37
RATE: 20 resp/min
RATE: 20 resp/min
pCO2 arterial: 27 mmHg — ABNORMAL LOW (ref 32.0–48.0)
pCO2 arterial: 43 mmHg (ref 32.0–48.0)
pH, Arterial: 7.09 — CL (ref 7.350–7.450)
pH, Arterial: 7.26 — ABNORMAL LOW (ref 7.350–7.450)
pO2, Arterial: 61 mmHg — ABNORMAL LOW (ref 83.0–108.0)
pO2, Arterial: 73 mmHg — ABNORMAL LOW (ref 83.0–108.0)

## 2020-03-03 LAB — CK: Total CK: 119 U/L (ref 49–397)

## 2020-03-03 LAB — PHOSPHORUS: Phosphorus: 7.1 mg/dL — ABNORMAL HIGH (ref 2.5–4.6)

## 2020-03-03 LAB — TROPONIN I (HIGH SENSITIVITY)
Troponin I (High Sensitivity): 8 ng/L (ref <18)
Troponin I (High Sensitivity): 20 ng/L — ABNORMAL HIGH (ref <18)

## 2020-03-03 LAB — PROCALCITONIN
Procalcitonin: 0.55 ng/mL
Procalcitonin: 0.54 ng/mL
Procalcitonin: 0.82 ng/mL

## 2020-03-03 LAB — MAGNESIUM: Magnesium: 2.4 mg/dL (ref 1.7–2.4)

## 2020-03-03 MED ORDER — ORAL CARE MOUTH RINSE
15.0000 mL | OROMUCOSAL | Status: DC
  Administered 2020-03-03 – 2020-03-04 (×9): 15 mL via OROMUCOSAL

## 2020-03-03 MED ORDER — FENTANYL CITRATE (PF) 100 MCG/2ML IJ SOLN: 100.0000 ug | INTRAMUSCULAR | Status: DC

## 2020-03-03 MED ORDER — SODIUM BICARBONATE 8.4 % IV SOLN
150.0000 meq | Freq: Once | INTRAVENOUS | Status: AC
  Administered 2020-03-03: 150 meq via INTRAVENOUS

## 2020-03-03 MED ORDER — INSULIN ASPART 100 UNIT/ML ~~LOC~~ SOLN: 0.0000 [IU] | Freq: Three times a day (TID) | SUBCUTANEOUS | Status: DC

## 2020-03-03 MED ORDER — SODIUM BICARBONATE 8.4 % IV SOLN
INTRAVENOUS | Status: AC
  Filled 2020-03-03: qty 150

## 2020-03-03 MED ORDER — FENTANYL CITRATE (PF) 100 MCG/2ML IJ SOLN
100.0000 ug | Freq: Once | INTRAMUSCULAR | Status: AC
  Administered 2020-03-03: 100 ug via INTRAVENOUS

## 2020-03-03 MED ORDER — LORAZEPAM 2 MG/ML IJ SOLN
2.0000 mg | INTRAMUSCULAR | Status: DC | PRN
  Administered 2020-03-03: 4 mg via INTRAVENOUS
  Administered 2020-03-07: 2 mg via INTRAVENOUS
  Filled 2020-03-03 (×3): qty 2

## 2020-03-03 MED ORDER — HYDROCORTISONE NA SUCCINATE PF 100 MG IJ SOLR
50.0000 mg | Freq: Four times a day (QID) | INTRAMUSCULAR | Status: DC
  Administered 2020-03-03: 50 mg via INTRAVENOUS
  Filled 2020-03-03: qty 2

## 2020-03-03 MED ORDER — INSULIN ASPART 100 UNIT/ML IV SOLN
10.0000 [IU] | Freq: Once | INTRAVENOUS | Status: AC
  Administered 2020-03-03: 10 [IU] via INTRAVENOUS
  Filled 2020-03-03: qty 0.1

## 2020-03-03 MED ORDER — MIDAZOLAM 50MG/50ML (1MG/ML) PREMIX INFUSION
0.5000 mg/h | INTRAVENOUS | Status: DC
  Administered 2020-03-03: 0.5 mg/h via INTRAVENOUS
  Administered 2020-03-03 (×2): 8 mg/h via INTRAVENOUS
  Administered 2020-03-04: 6 mg/h via INTRAVENOUS
  Filled 2020-03-03 (×4): qty 50

## 2020-03-03 MED ORDER — FENTANYL CITRATE (PF) 100 MCG/2ML IJ SOLN: 50.0000 ug | Freq: Once | INTRAMUSCULAR | Status: AC

## 2020-03-03 MED ORDER — SODIUM BICARBONATE 8.4 % IV SOLN: 150.0000 meq | Freq: Once | INTRAVENOUS | Status: DC

## 2020-03-03 MED ORDER — LORAZEPAM 2 MG/ML IJ SOLN
2.0000 mg | INTRAMUSCULAR | Status: AC
  Administered 2020-03-03: 2 mg via INTRAVENOUS

## 2020-03-03 MED ORDER — LORAZEPAM 2 MG/ML IJ SOLN: 2.0000 mg | INTRAMUSCULAR | Status: AC

## 2020-03-03 MED ORDER — STERILE WATER FOR INJECTION IV SOLN
INTRAVENOUS | Status: DC
  Filled 2020-03-03 (×4): qty 850
  Filled 2020-03-03: qty 150

## 2020-03-03 MED ORDER — CHLORHEXIDINE GLUCONATE CLOTH 2 % EX PADS
6.0000 | MEDICATED_PAD | Freq: Every day | CUTANEOUS | Status: DC
  Administered 2020-03-03 – 2020-03-05 (×3): 6 via TOPICAL

## 2020-03-03 MED ORDER — SODIUM ZIRCONIUM CYCLOSILICATE 5 G PO PACK
10.0000 g | PACK | Freq: Three times a day (TID) | ORAL | Status: DC
  Administered 2020-03-03: 10 g
  Filled 2020-03-03: qty 2

## 2020-03-03 MED ORDER — FENTANYL CITRATE (PF) 100 MCG/2ML IJ SOLN
INTRAMUSCULAR | Status: AC
  Filled 2020-03-03: qty 2

## 2020-03-03 MED ORDER — LORAZEPAM 2 MG/ML IJ SOLN: 1.0000 mg | INTRAMUSCULAR | Status: DC | PRN

## 2020-03-03 MED ORDER — SODIUM CHLORIDE 0.9 % IV SOLN
250.0000 mg | INTRAVENOUS | Status: AC
  Administered 2020-03-03: 250 mg via INTRAVENOUS
  Filled 2020-03-03: qty 2.5

## 2020-03-03 MED ORDER — FAMOTIDINE IN NACL 20-0.9 MG/50ML-% IV SOLN
20.0000 mg | INTRAVENOUS | Status: DC
  Administered 2020-03-04 – 2020-03-08 (×5): 20 mg via INTRAVENOUS
  Filled 2020-03-03 (×5): qty 50

## 2020-03-03 MED ORDER — SODIUM CHLORIDE 0.9 % IV SOLN
3.0000 g | Freq: Two times a day (BID) | INTRAVENOUS | Status: DC
  Administered 2020-03-03: 3 g via INTRAVENOUS
  Filled 2020-03-03 (×2): qty 8
  Filled 2020-03-03: qty 3

## 2020-03-03 MED ORDER — LORAZEPAM 2 MG/ML IJ SOLN
INTRAMUSCULAR | Status: AC
  Administered 2020-03-03: 2 mg via INTRAVENOUS
  Filled 2020-03-03: qty 1

## 2020-03-03 MED ORDER — CHLORHEXIDINE GLUCONATE 0.12% ORAL RINSE (MEDLINE KIT)
15.0000 mL | Freq: Two times a day (BID) | OROMUCOSAL | Status: DC
  Administered 2020-03-03 – 2020-03-05 (×4): 15 mL via OROMUCOSAL

## 2020-03-03 MED ORDER — VECURONIUM BROMIDE 10 MG IV SOLR: 10.0000 mg | INTRAVENOUS | Status: DC

## 2020-03-03 MED ORDER — VECURONIUM BROMIDE 10 MG IV SOLR
10.0000 mg | Freq: Once | INTRAVENOUS | Status: AC
  Administered 2020-03-03: 10 mg via INTRAVENOUS
  Filled 2020-03-03: qty 10

## 2020-03-03 MED ORDER — MAGNESIUM SULFATE 2 GM/50ML IV SOLN
2.0000 g | Freq: Once | INTRAVENOUS | Status: AC
  Administered 2020-03-03: 2 g via INTRAVENOUS
  Filled 2020-03-03: qty 50

## 2020-03-03 NOTE — Progress Notes (Signed)
Pt now with recurrence of continued generalized seizure-like activity despite initiation of scheduled iv dilantin onset 04:25 am.  Pt has received a total of 10 mg divided doses of iv ativan , 250 mg iv keppra x1 dose, and versed gtt initiated in addition to the scheduled iv dilantin.  However, he continues to have seizure activity.  Due to pt requiring maximum doses of dopamine, levophed, vasopressin, and neo-synephrine gtts he remains too unstable for transport for CT Head and too unstable to transfer to another facility for continuous EEG monitoring.  I discussed case with ICU Intensivist Dr. Belia Heman and he agrees with plan of care as outlined above and contacting pts family to arrive at hospital to discuss code status and goals of treatment.  I spoke with pts sister Johnnye Sima and informed her due to continued decline in pts condition it is unlikely he will survive this hospitalization.  I requested she arrive at pts bedside along with pts sister Myrene Buddy and uncle Christphor Groft to discuss code status and goals of treatment.  She stated they would arrive at the hospital between 07:30 to 08:00 am today 03/03/2020. Will continue to monitor and assess pt.   Sonda Rumble, AGNP  Pulmonary/Critical Care Pager [phone] (please enter 7 digits) PCCM Consult Pager [phone] (please enter 7 digits)

## 2020-03-03 NOTE — Procedures (Signed)
ELECTROENCEPHALOGRAM REPORT   Patient: Brad Graham       Room #: IC20A-AA EEG No. ID: [id number] Age: 59 y.o.        Sex: male Requesting Physician: Kasa Report Date:  03/03/2020        Interpreting Physician: Thana Farr  History: [name] is an 59 y.o. male s/p arrest  Medications:  Dilantin, Fentanyl, Versed, Pitressin, Phenylephrine, Levophed, Dopamine  Conditions of Recording:  This is a 21 channel routine scalp EEG performed with bipolar and monopolar montages arranged in accordance to the international 10/20 system of electrode placement. One channel was dedicated to EKG recording.  The patient is in the intubated and sedated state.  Description:  The background activity is dominated by a low voltage, fairly well organized alpha activity at 10-11 Hz.  This alpha activity is diffusely distributed and continuous.   No epileptiform activity is noted.  Hyperventilation and intermittent photic stimulation were not performed.  IMPRESSION: This electroencephalogram is consistent with alpha coma.  No epileptiform activity is noted.     Thana Farr, MD Neurology [phone] 03/03/2020, 4:28 PM

## 2020-03-03 NOTE — Progress Notes (Signed)
Pt was transported to CT and back to CCU while on the vent. °

## 2020-03-03 NOTE — Progress Notes (Signed)
CRITICAL CARE NOTE 59 yo male admitted s/p PEA arrest following a grand mal seizure with acute renal failure with hyperkalemia, severe metabolic acidosis, and cardiogenic shock requiring mechanical intubation and vasopressors   SIGNIFICANT EVENTS/STUDIES:  06/9: Pt admitted to ICU mechanically intubated following a PEA arrest 06/9: severe cardiogenic shock and multiorgan failure,  6/9 ART line and CVL placed +SEIZURES  CC  follow up respiratory failure  SUBJECTIVE Patient remains critically ill Prognosis is guarded   BP (!) 122/57    Pulse 94    Temp (!) 97.2 F (36.2 C)    Resp (!) 28    Ht 6' (1.829 m)    Wt 117.8 kg    SpO2 100%    BMI 35.22 kg/m    I/O last 3 completed shifts: In: 4826.1 [I.V.:2364.7; IV Piggyback:2461.4] Out: 25 [Urine:25] No intake/output data recorded.  SpO2: 100 % FiO2 (%): 60 %  Estimated body mass index is 35.22 kg/m as calculated from the following:   Height as of this encounter: 6' (1.829 m).   Weight as of this encounter: 117.8 kg.  SIGNIFICANT EVENTS   REVIEW OF SYSTEMS  PATIENT IS UNABLE TO PROVIDE COMPLETE REVIEW OF SYSTEMS DUE TO SEVERE CRITICAL ILLNESS        PHYSICAL EXAMINATION:  GENERAL:critically ill appearing, +resp distress HEAD: Normocephalic, atraumatic.  EYES: Pupils equal, round, reactive to light.  No scleral icterus.  MOUTH: Moist mucosal membrane. NECK: Supple.  PULMONARY: +rhonchi, +wheezing CARDIOVASCULAR: S1 and S2. Regular rate and rhythm. No murmurs, rubs, or gallops.  GASTROINTESTINAL: Soft, nontender, -distended.  Positive bowel sounds.   MUSCULOSKELETAL: No swelling, clubbing, or edema.  NEUROLOGIC: obtunded, GCS<8 SKIN:intact,warm,dry  MEDICATIONS: I have reviewed all medications and confirmed regimen as documented   CULTURE RESULTS   Recent Results (from the past 240 hour(s))  SARS Coronavirus 2 by RT PCR (hospital order, performed in [facility] hospital lab) Nasopharyngeal Nasopharyngeal Swab      Status: None   Collection Time: 03/02/20  8:55 PM   Specimen: Nasopharyngeal Swab  Result Value Ref Range Status   SARS Coronavirus 2 NEGATIVE NEGATIVE Final    Comment: (NOTE) SARS-CoV-2 target nucleic acids are NOT DETECTED. The SARS-CoV-2 RNA is generally detectable in upper and lower respiratory specimens during the acute phase of infection. The lowest concentration of SARS-CoV-2 viral copies this assay can detect is 250 copies / mL. A negative result does not preclude SARS-CoV-2 infection and should not be used as the sole basis for treatment or other patient management decisions.  A negative result may occur with improper specimen collection / handling, submission of specimen other than nasopharyngeal swab, presence of viral mutation(s) within the areas targeted by this assay, and inadequate number of viral copies (<250 copies / mL). A negative result must be combined with clinical observations, patient history, and epidemiological information. Fact Sheet for Patients:   BoilerBrush.com.cy Fact Sheet for Healthcare Providers: https://pope.com/ This test is not yet approved or cleared  by the [place] FDA and has been authorized for detection and/or diagnosis of SARS-CoV-2 by FDA under an Emergency Use Authorization (EUA).  This EUA will remain in effect (meaning this test can be used) for the duration of the COVID-19 declaration under Section 564(b)(1) of the Act, 21 U.S.C. section 360bbb-3(b)(1), unless the authorization is terminated or revoked sooner. Performed at [facility], [address], [place], [place] [address]   MRSA PCR Screening     Status: None   Collection Time: 03/02/20 10:30  PM   Specimen: Nasopharyngeal  Result Value Ref Range Status   MRSA by PCR NEGATIVE NEGATIVE Final    Comment:        The GeneXpert MRSA Assay (FDA approved for NASAL specimens only), is one component of  a comprehensive MRSA colonization surveillance program. It is not intended to diagnose MRSA infection nor to guide or monitor treatment for MRSA infections. Performed at [facility], [place], [place] [address]           IMAGING    DG Abdomen 1 View  Result Date: 03/02/2020 CLINICAL DATA:  Nasogastric tube placement. EXAM: ABDOMEN - 1 VIEW COMPARISON:  None. FINDINGS: A nasogastric tube is seen with its distal tip overlying the expected region of the gastric antrum. The bowel gas pattern is normal. No radio-opaque calculi or other significant radiographic abnormality are seen. IMPRESSION: Nasogastric tube positioning, as described above. Electronically Signed   By: Virgina Norfolk M.D.   On: 03/02/2020 20:14   DG Chest Port 1 View  Result Date: 03/02/2020 CLINICAL DATA:  Central line placement EXAM: PORTABLE CHEST 1 VIEW COMPARISON:  Radiograph 03/02/2020 FINDINGS: Endotracheal tube in the mid trachea, 4.1 cm from the carina. Transesophageal tube tip and side port are below the GE junction, beyond the margin of imaging. There is a new left IJ approach central venous catheter tip which terminates in the vicinity of the left brachiocephalic-caval confluence. Telemetry leads and pacer pads overlie the chest wall. There is central vascular congestion and some hazy interstitial changes with diminished lung volumes. No focal consolidation, pneumothorax or visible effusion. Cardiomediastinal contours are unchanged from prior. No acute osseous or soft tissue abnormality. IMPRESSION: 1. New left IJ approach central venous catheter tip terminates in the vicinity of the left brachiocephalic-caval confluence. 2. Endotracheal and transesophageal tubes are in satisfactory position. 3. More hazy opacity could reflect atelectasis or early edema in the setting of low volumes and central vascular congestion. Electronically Signed   By: Lovena Le M.D.   On: 03/02/2020 23:29   DG  Chest Port 1 View  Result Date: 03/02/2020 CLINICAL DATA:  Status post endotracheal tube placement. EXAM: PORTABLE CHEST 1 VIEW COMPARISON:  July 14, 2016 FINDINGS: An endotracheal tube is seen with its distal tip approximately 4.0 cm from the carina. A nasogastric tube is noted with its distal tip seen within the body of the stomach. There is no evidence of acute infiltrate, pleural effusion or pneumothorax. The cardiac silhouette is mildly enlarged. The visualized skeletal structures are unremarkable. IMPRESSION: Endotracheal tube and nasogastric tube in good position. Electronically Signed   By: Virgina Norfolk M.D.   On: 03/02/2020 20:14     Nutrition Status:           Indwelling Urinary Catheter continued, requirement due to   Reason to continue Indwelling Urinary Catheter strict Intake/Output monitoring for hemodynamic instability   Central Line/ continued, requirement due to  Reason to continue [place] of central venous pressure or other hemodynamic parameters and poor IV access   Ventilator continued, requirement due to severe respiratory failure   Ventilator Sedation RASS 0 to -2      ASSESSMENT AND PLAN SYNOPSIS  Severe ACUTE Hypoxic and Hypercapnic Respiratory Failure from acute cardiac arrest with aspiration pneumonia and seizures h/o ETOH abuse   Severe ACUTE Hypoxic and Hypercapnic Respiratory Failure -continue Mechanical Ventilator support -continue Bronchodilator Therapy -Wean Fio2 and PEEP as tolerated -VAP/VENT bundle implementation   ACUTE SYSTOLIC CARDIAC  ARREST FAILURE-CHECK ECHO Vent  Support  Morbid obesity, possible OSA.   Will certainly impact respiratory mechanics, ventilator weaning   ACUTE KIDNEY INJURY/Renal Failure -continue Foley Catheter-assess need -Avoid nephrotoxic agents -Follow urine output, BMP -Ensure adequate renal perfusion, optimize oxygenation -Renal dose medications     NEUROLOGY seizures Obtain  EEG at some time Needs MRI brain Evidence of brain damage    Acute toxic metabolic encephalopathy, need for sedation Goal RASS -2 to -3  SHOCK-SEPSIS/CARDIOGENIC -use vasopressors to keep MAP>65 -follow ABG and LA -follow up cultures -emperic ABX -consider stress dose steroids -aggressive IV fluid resuscitation  CARDIAC ICU monitoring  ID -continue IV abx as prescibed -follow up cultures  GI GI PROPHYLAXIS as indicated  NUTRITIONAL STATUS Nutrition Status:         DIET-->TF's as tolerated Constipation protocol as indicated  ENDO - will use ICU hypoglycemic\Hyperglycemia protocol if indicated     ELECTROLYTES -follow labs as needed -replace as needed -pharmacy consultation and following   DVT/GI PRX ordered and assessed TRANSFUSIONS AS NEEDED MONITOR FSBS I Assessed the need for Labs I Assessed the need for Foley I Assessed the need for Central Venous Line Family Discussion when available I Assessed the need for Mobilization I made an Assessment of medications to be adjusted accordingly Safety Risk assessment completed   CASE DISCUSSED IN MULTIDISCIPLINARY ROUNDS WITH ICU TEAM  Critical Care Time devoted to patient care services described in this note is 35 minutes.   Overall, patient is critically ill, prognosis is guarded.  Patient with Multiorgan failure and at high risk for cardiac arrest and death.    Lucie Leather, M.D.  [facility]  Medical Director [facility] Medical Director [facility] Cardio-Pulmonary Department

## 2020-03-03 NOTE — Progress Notes (Signed)
eeg completed ° °

## 2020-03-03 NOTE — Procedures (Signed)
Central Venous Catheter Insertion Procedure Note Brad Graham [id number] August 10, 1961  Procedure: Insertion of Central Venous Catheter Indications: Assessment of intravascular volume, Drug and/or fluid administration and Frequent blood sampling  Procedure Details Consent: Risks of procedure as well as the alternatives and risks of each were explained to the (patient/caregiver).  Consent for procedure obtained. Time Out: Verified patient identification, verified procedure, site/side was marked, verified correct patient position, special equipment/implants available, medications/allergies/relevent history reviewed, required imaging and test results available.  Performed  Maximum sterile technique was used including antiseptics, cap, gloves, gown, hand hygiene, mask and sheet. Skin prep: Chlorhexidine; local anesthetic administered A antimicrobial bonded/coated triple lumen catheter was placed in the left internal jugular vein using the Seldinger technique.  Evaluation Blood flow good Complications: No apparent complications Patient did tolerate procedure well. Chest X-ray ordered to verify placement.  CXR: normal.   Left internal jugular central line placed utilizing ultrasound no complications noted during or following procedure.  Brad Graham, AGNP  Pulmonary/Critical Care Pager [phone] (please enter 7 digits) PCCM Consult Pager [phone] (please enter 7 digits)

## 2020-03-03 NOTE — Progress Notes (Signed)
The Clinical status was relayed to family in detail. 2 sisters and Uncle at bedside  Updated and notified of patients medical condition.  Patient remains unresponsive and will not open eyes to command.    patient with increased WOB and using accessory muscles to breathe Explained to family course of therapy and the modalities     Patient with Progressive multiorgan failure with very low chance of meaningful recovery despite all aggressive and optimal medical therapy.  Patient is in the dying Process associated with suffering.  Family understands the situation.  They have consented and agreed to DNR status and will proceed with comfort measures after rest of family visits   Family are satisfied with Plan of action and management. All questions answered  Additional CC time 32 mins   Arilyn Brierley Santiago Glad, M.D.  [facility]  Medical Director [facility] Medical Director [facility] Cardio-Pulmonary Department

## 2020-03-03 NOTE — Progress Notes (Signed)
Reviewed basic metabolic results with Dr. Belia Heman.  Bicarb drip order changed and sliding scale to be ordered.  Notified Dr. Belia Heman that patient has been weaned off Levophed and neo synephrine being titrated down.  Nurse inquired about EEG and CT of head due to patient appearing to be stable but no order obtained.

## 2020-03-03 NOTE — Progress Notes (Signed)
I spoke with pts sister Johnnye Sima via telephone regarding continued decline in pts condition despite aggressive treatment.  I informed Mrs. Bascom Levels the pt continues to require multiple medications in an attempt to stabilize blood pressure, however Mr. Stetzer remains hypotensive.  I also informed Mrs. Bascom Levels the pt is at HIGH risk for cardiac arrest and sudden death.  She stated she understood Mr. Belsky poor prognosis, however for now she would like to continue current plan of care.  Will continue to monitor and assess pt.    Sonda Rumble, AGNP  Pulmonary/Critical Care Pager [phone] (please enter 7 digits) PCCM Consult Pager [phone] (please enter 7 digits)

## 2020-03-03 NOTE — Procedures (Signed)
Arterial Catheter Insertion Procedure Note Brad Graham [id number] 1961-07-20  Procedure: Insertion of Arterial Catheter  Indications: Blood pressure monitoring and Frequent blood sampling  Procedure Details Consent: Risks of procedure as well as the alternatives and risks of each were explained to the (patient/caregiver).  Consent for procedure obtained. Time Out: Verified patient identification, verified procedure, site/side was marked, verified correct patient position, special equipment/implants available, medications/allergies/relevent history reviewed, required imaging and test results available.  Performed  Maximum sterile technique was used including antiseptics, cap, gloves, gown, hand hygiene, mask and sheet. Skin prep: Chlorhexidine; local anesthetic administered 20 gauge catheter was inserted into right femoral artery using the Seldinger technique. ULTRASOUND GUIDANCE USED: YES Evaluation Blood flow good; BP tracing good. Complications: No apparent complications.   Right femoral arterial line placed utilizing ultrasound no complications noted during or following procedure.    Sonda Rumble, AGNP  Pulmonary/Critical Care Pager [phone] (please enter 7 digits) PCCM Consult Pager [phone] (please enter 7 digits)

## 2020-03-03 NOTE — Progress Notes (Signed)
Pharmacy Antibiotic Note  Brad Graham is a 58 y.o. male admitted on 03/02/2020 with pneumonia.  Pharmacy has been consulted for Unasyn dosing.  Plan: Unasyn 3gm IV q12hrs (renally adjusted)  Height: 6' (182.9 cm) Weight: 113 kg (249 lb 0.5 oz) IBW/kg (Calculated) : 77.6  Temp (24hrs), Avg:89.5 F (31.9 C), Min:87 F (30.6 C), Max:90.7 F (32.6 C)  Recent Labs  Lab 03/02/20 2001 03/02/20 2036 03/02/20 2311  WBC 8.0  --   --   CREATININE 3.95* 3.93* 3.62*  LATICACIDVEN 4.5*  --  1.4    Estimated Creatinine Clearance: 28.2 mL/min (A) (by C-G formula based on SCr of 3.62 mg/dL (H)).    No Known Allergies  Antimicrobials this admission:   >>    >>   Dose adjustments this admission:   Microbiology results:  BCx:   UCx:    Sputum:    MRSA PCR:   Thank you for allowing pharmacy to be a part of this patient's care.  [name] A 03/03/2020 12:04 AM

## 2020-03-03 NOTE — Progress Notes (Signed)
PHARMACY - PHYSICIAN COMMUNICATION CRITICAL VALUE ALERT - BLOOD CULTURE IDENTIFICATION (BCID)  Brad Graham is an 59 y.o. male who presented to [facility] on 03/02/2020 with a chief complaint of multi-organ failure  Assessment:  1/4(anaerobic) GPC, Staph Species, mecA not detected  Name of physician (or Provider) Contacted: Kasa  Current antibiotics: none  Changes to prescribed antibiotics recommended:  Patient is comfort care, will defer administration of antibiotics at this time.  Results for orders placed or performed during the hospital encounter of 03/02/20  Blood Culture ID Panel (Reflexed) (Collected: 03/02/2020  8:01 PM)  Result Value Ref Range   Enterococcus species NOT DETECTED NOT DETECTED   Listeria monocytogenes NOT DETECTED NOT DETECTED   Staphylococcus species DETECTED (A) NOT DETECTED   Staphylococcus aureus (BCID) NOT DETECTED NOT DETECTED   Methicillin resistance NOT DETECTED NOT DETECTED   Streptococcus species NOT DETECTED NOT DETECTED   Streptococcus agalactiae NOT DETECTED NOT DETECTED   Streptococcus pneumoniae NOT DETECTED NOT DETECTED   Streptococcus pyogenes NOT DETECTED NOT DETECTED   Acinetobacter baumannii NOT DETECTED NOT DETECTED   Enterobacteriaceae species NOT DETECTED NOT DETECTED   Enterobacter cloacae complex NOT DETECTED NOT DETECTED   Escherichia coli NOT DETECTED NOT DETECTED   Klebsiella oxytoca NOT DETECTED NOT DETECTED   Klebsiella pneumoniae NOT DETECTED NOT DETECTED   Proteus species NOT DETECTED NOT DETECTED   Serratia marcescens NOT DETECTED NOT DETECTED   Haemophilus influenzae NOT DETECTED NOT DETECTED   Neisseria meningitidis NOT DETECTED NOT DETECTED   Pseudomonas aeruginosa NOT DETECTED NOT DETECTED   Candida albicans NOT DETECTED NOT DETECTED   Candida glabrata NOT DETECTED NOT DETECTED   Candida krusei NOT DETECTED NOT DETECTED   Candida parapsilosis NOT DETECTED NOT DETECTED   Candida tropicalis NOT DETECTED NOT DETECTED     [name] 03/03/2020  8:36 PM

## 2020-03-03 NOTE — Consult Note (Signed)
Consultation Note Date: 03/03/2020   Patient Name: Brad Graham  DOB: September 08, 1961  MRN: [id number]  Age / Sex: 59 y.o., male  PCP: Helayne Seminole, NP Referring Physician: Flora Lipps, MD  Reason for Consultation: Establishing goals of care and Withdrawal of life-sustaining treatment  HPI/Patient Profile: 59 y.o. male  with past medical history of alcohol abuse, arthritis, CVA, HTN/HLD,admitted on 03/02/2020 with acute respiratory failure status post PEA arrest.   Clinical Assessment and Goals of Care: Mr. Brad, Graham, is lying quietly in bed.  He is intubated/ventilated and sedated.  He is on multiple vasopressors.  He appears acutely/chronically ill.  His sisters Carolyn/Vicki and Vaughan Basta are at bedside along with nephew Regenia Skeeter.  We talked about Brad Graham's acute health problems and life support.  Family shares that they would like for me to speak with uncle Brad Graham who was in the waiting room.  Family present Carolyn/Vicki, Vaughan Basta, and uncle Brad Graham state that they want to focus on comfort, but are unsure of timeframe for compassionate extubation.  Family given space and time for consideration.  Sisters Carolyn/Vicki and Vaughan Basta request that PMT call brother Brad Graham at [phone]. Uncle Brad Graham states that Brad Graham can be difficult to speak with he has issues and is a [place] veteran. Call to Daiva Eves who states he is not ready for Brad Graham to be extubated. He shares that he feels that if [name] dies on life support "so be it".  We talk about Brad Graham's suffering and Brad Graham states that he doesn't want to talk about it and hangs up.    PMT returns to the room to speak with Theodosia Blender and sisters Carolyn/"Vicky" and Vaughan Basta.  Uncle Brad Graham states that Brad Graham is suffering and agrees that Brad Graham may never be able to let go.  Sisters agree.  I encourage family to keep doing at the center of decision-making.  At this time family is  unable to agree on a time for compassionate extubation.  Detail conference with attending and bedside nursing staff.   HCPOA  NEXT OF KIN - Uncle Brad Graham manages Brad Graham's money.  [name] is not married and has no children.  Adult siblings are decision makers.    SUMMARY OF RECOMMENDATIONS   Family is considering compassionate extubation  Code Status/Advance Care Planning:  DNR  Symptom Management:   Per CCM, added comfort care orders  Palliative Prophylaxis:   Frequent Pain Assessment and Oral Care  Additional Recommendations (Limitations, Scope, Preferences):  Full Comfort Care  Psycho-social/Spiritual:   Desire for further Chaplaincy support:yes  Additional Recommendations: Caregiving  Support/Resources and Education on Hospice  Prognosis:   Hours - Days  Discharge Planning: Anticipated Hospital Death      Primary Diagnoses: Present on Admission: . Cardiac arrest ([place])   I have reviewed the medical record, interviewed the patient and family, and examined the patient. The following aspects are pertinent.  Past Medical History:  Diagnosis Date  . Alcohol abuse   . Arthritis   . H/O: CVA (cerebrovascular accident)   . Hyperlipidemia   .  Hypertension   . Syncope 07/02/2016  . Syncope and collapse 01/27/2020   Social History   Socioeconomic History  . Marital status: Single    Spouse name: Not on file  . Number of children: Not on file  . Years of education: Not on file  . Highest education level: Not on file  Occupational History  . Not on file  Tobacco Use  . Smoking status: Current Every Day Smoker    Packs/day: 0.50    Types: Cigarettes  . Smokeless tobacco: Never Used  Vaping Use  . Vaping Use: Never used  Substance and Sexual Activity  . Alcohol use: Yes    Comment: fifth every other day  . Drug use: No  . Sexual activity: Never  Other Topics Concern  . Not on file  Social History Narrative  . Not on file   Social Determinants of  Health   Financial Resource Strain:   . Difficulty of Paying Living Expenses:   Food Insecurity:   . Worried About Charity fundraiser in the Last Year:   . Arboriculturist in the Last Year:   Transportation Needs:   . Film/video editor (Medical):   Marland Kitchen Lack of Transportation (Non-Medical):   Physical Activity:   . Days of Exercise per Week:   . Minutes of Exercise per Session:   Stress:   . Feeling of Stress :   Social Connections:   . Frequency of Communication with Friends and Family:   . Frequency of Social Gatherings with Friends and Family:   . Attends Religious Services:   . Active Member of Clubs or Organizations:   . Attends Archivist Meetings:   Marland Kitchen Marital Status:    Family History  Problem Relation Age of Onset  . Diabetes Mother   . Stroke Mother   . Heart disease Maternal Grandmother   . Heart disease Maternal Grandfather    Scheduled Meds: . atorvastatin  20 mg Per Tube q1800  . Chlorhexidine Gluconate Cloth  6 each Topical Daily  . docusate  100 mg Per Tube BID  . folic acid  1 mg Intravenous Daily  . hydrocortisone sod succinate (SOLU-CORTEF) inj  50 mg Intravenous Q6H  . multivitamin  15 mL Per Tube Daily  . sodium chloride flush  10-40 mL Intracatheter Q12H  . sodium zirconium cyclosilicate  10 g Per Tube TID   Continuous Infusions: . sodium chloride Stopped (03/03/20 0935)  . sodium chloride    . ampicillin-sulbactam (UNASYN) IV 3 g (03/03/20 0215)  . DOPamine 20 mcg/kg/min (03/03/20 1000)  . famotidine (PEPCID) IV Stopped (03/03/20 0912)  . fentaNYL infusion INTRAVENOUS Stopped (03/02/20 2258)  . midazolam 8 mg/hr (03/03/20 1000)  . norepinephrine (LEVOPHED) Adult infusion 40 mcg/min (03/03/20 1021)  . phenylephrine (NEO-SYNEPHRINE) Adult infusion 400 mcg/min (03/03/20 1000)  . phenytoin (DILANTIN) IV Stopped (03/03/20 0706)   Followed by  . phenytoin (DILANTIN) IV    .  sodium bicarbonate (isotonic) infusion in sterile water 200  mL/hr at 03/03/20 1000  . thiamine injection 100 mL/hr at 03/03/20 1000  . vasopressin (PITRESSIN) infusion - *FOR SHOCK* 0.04 Units/min (03/03/20 1000)   PRN Meds:.fentaNYL, LORazepam, ondansetron (ZOFRAN) IV, polyethylene glycol, sodium chloride flush Medications Prior to Admission:  Prior to Admission medications   Medication Sig Start Date End Date Taking? Authorizing Provider  amLODipine (NORVASC) 10 MG tablet Take 1 tablet (10 mg total) by mouth daily. 02/16/20  Yes Helayne Seminole, NP  aspirin EC 81 MG tablet Take 1 tablet (81 mg total) by mouth daily. 02/02/20  Yes Carnella Guadalajara I, NP  atorvastatin (LIPITOR) 20 MG tablet Take 1 tablet (20 mg total) by mouth daily at 6 PM. 02/02/20  Yes Carnella Guadalajara I, NP  blood glucose meter kit and supplies KIT Dispense based on patient and insurance preference. Use up to four times daily as directed. (FOR ICD-9 250.00, 250.01). 01/28/20  Yes Swayze, Ava, DO  carvedilol (COREG) 25 MG tablet Take 1 tablet (25 mg total) by mouth 2 (two) times daily with a meal. 02/02/20  Yes Carnella Guadalajara I, NP  folic acid (FOLVITE) 1 MG tablet Take 1 tablet (1 mg total) by mouth daily. 02/02/20  Yes Carnella Guadalajara I, NP  hydrALAZINE (APRESOLINE) 25 MG tablet Take 1 tablet (25 mg total) by mouth every 6 (six) hours. 01/28/20  Yes Swayze, Ava, DO  metFORMIN (GLUCOPHAGE) 1000 MG tablet Take 1 tablet (1,000 mg total) by mouth daily with breakfast. 02/02/20 02/01/21 Yes Carnella Guadalajara I, NP  Multiple Vitamin (MULTIVITAMIN WITH MINERALS) TABS tablet Take 1 tablet by mouth daily. 02/02/20  Yes Carnella Guadalajara I, NP  tamsulosin (FLOMAX) 0.4 MG CAPS capsule Take 1 capsule (0.4 mg total) by mouth daily after supper. 01/28/20  Yes Swayze, Ava, DO  thiamine 100 MG tablet Take 1 tablet (100 mg total) by mouth daily. 02/02/20  Yes Carnella Guadalajara I, NP   No Known Allergies Review of Systems  Unable to perform ROS: Intubated    Physical Exam Vitals and nursing note reviewed.  Constitutional:       General: He is not in acute distress.    Appearance: He is obese.  Cardiovascular:     Rate and Rhythm: Normal rate.  Pulmonary:     Comments: Intubated/ventilated, sedated on vasopressors Musculoskeletal:        General: No swelling.  Skin:    General: Skin is warm and dry.  Neurological:     Comments: Intubated/ventilated     Vital Signs: BP (!) 141/57   Pulse 93   Temp 98.1 F (36.7 C) (Bladder)   Resp (!) 27   Ht 6' (1.829 m)   Wt 117.8 kg   SpO2 100%   BMI 35.22 kg/m  Pain Scale: Faces       SpO2: SpO2: 100 % O2 Device:SpO2: 100 % O2 Flow Rate: .   IO: Intake/output summary:   Intake/Output Summary (Last 24 hours) at 03/03/2020 1035 Last data filed at 03/03/2020 1000 Gross per 24 hour  Intake 7367.14 ml  Output 25 ml  Net 7342.14 ml    LBM: Last BM Date:  (unknown) Baseline Weight: Weight: 120.2 kg Most recent weight: Weight: 117.8 kg     Palliative Assessment/Data:   Flowsheet Rows     Most Recent Value  Intake Tab  Referral Department Critical care  Unit at Time of Referral ICU  Palliative Care Primary Diagnosis Cardiac  Date Notified 03/03/20  Palliative Care Type New Palliative care  Reason for referral Clarify Goals of Care, End of Life Care Assistance  Date of Admission 03/02/20  Date first seen by Palliative Care 03/03/20  # of days Palliative referral response time 0 Day(s)  # of days IP prior to Palliative referral 1  Clinical Assessment  Palliative Performance Scale Score 10%  Pain Max last 24 hours Not able to report  Pain Min Last 24 hours Not able to report  Dyspnea Max Last 24 Hours Not able to  report  Dyspnea Min Last 24 hours Not able to report  Psychosocial & Spiritual Assessment  Palliative Care Outcomes      Time In: 0940 Time Out: 1050 Time Total: 70 minutes  Greater than 50%  of this time was spent counseling and coordinating care related to the above assessment and plan.  Signed by: Drue Novel, NP     Please contact Palliative Medicine Team phone at [phone] for questions and concerns.  For individual provider: See Shea Evans

## 2020-03-04 ENCOUNTER — Other Ambulatory Visit: Payer: Self-pay

## 2020-03-04 LAB — GLUCOSE, CAPILLARY
Glucose-Capillary: 267 mg/dL — ABNORMAL HIGH (ref 70–99)
Glucose-Capillary: 156 mg/dL — ABNORMAL HIGH (ref 70–99)
Glucose-Capillary: 131 mg/dL — ABNORMAL HIGH (ref 70–99)
Glucose-Capillary: 91 mg/dL (ref 70–99)
Glucose-Capillary: 111 mg/dL — ABNORMAL HIGH (ref 70–99)

## 2020-03-04 LAB — BLOOD GAS, ARTERIAL
Acid-Base Excess: 6.4 mmol/L — ABNORMAL HIGH (ref 0.0–2.0)
Bicarbonate: 30.5 mmol/L — ABNORMAL HIGH (ref 20.0–28.0)
FIO2: 0.35
MECHVT: 500 mL
O2 Saturation: 93.1 %
PEEP: 5 cmH2O
Patient temperature: 37
RATE: 20 resp/min
pCO2 arterial: 41 mmHg (ref 32.0–48.0)
pH, Arterial: 7.48 — ABNORMAL HIGH (ref 7.350–7.450)
pO2, Arterial: 62 mmHg — ABNORMAL LOW (ref 83.0–108.0)

## 2020-03-04 LAB — CBC
HCT: 26.6 % — ABNORMAL LOW (ref 39.0–52.0)
Hemoglobin: 9.4 g/dL — ABNORMAL LOW (ref 13.0–17.0)
MCH: 29.7 pg (ref 26.0–34.0)
MCHC: 35.3 g/dL (ref 30.0–36.0)
MCV: 83.9 fL (ref 80.0–100.0)
Platelets: 123 10*3/uL — ABNORMAL LOW (ref 150–400)
RBC: 3.17 MIL/uL — ABNORMAL LOW (ref 4.22–5.81)
RDW: 14 % (ref 11.5–15.5)
WBC: 6 10*3/uL (ref 4.0–10.5)
nRBC: 0.3 % — ABNORMAL HIGH (ref 0.0–0.2)

## 2020-03-04 LAB — COMPREHENSIVE METABOLIC PANEL
ALT: 18 U/L (ref 0–44)
AST: 18 U/L (ref 15–41)
Albumin: 2.6 g/dL — ABNORMAL LOW (ref 3.5–5.0)
Alkaline Phosphatase: 56 U/L (ref 38–126)
Anion gap: 12 (ref 5–15)
BUN: 44 mg/dL — ABNORMAL HIGH (ref 6–20)
CO2: 30 mmol/L (ref 22–32)
Calcium: 6.3 mg/dL — CL (ref 8.9–10.3)
Chloride: 98 mmol/L (ref 98–111)
Creatinine, Ser: 2.21 mg/dL — ABNORMAL HIGH (ref 0.61–1.24)
GFR calc Af Amer: 36 mL/min — ABNORMAL LOW (ref >60)
GFR calc non Af Amer: 31 mL/min — ABNORMAL LOW (ref >60)
Glucose, Bld: 322 mg/dL — ABNORMAL HIGH (ref 70–99)
Potassium: 3.7 mmol/L (ref 3.5–5.1)
Sodium: 140 mmol/L (ref 135–145)
Total Bilirubin: 0.6 mg/dL (ref 0.3–1.2)
Total Protein: 5.6 g/dL — ABNORMAL LOW (ref 6.5–8.1)

## 2020-03-04 LAB — URINE CULTURE: Culture: NO GROWTH

## 2020-03-04 LAB — MAGNESIUM: Magnesium: 1.6 mg/dL — ABNORMAL LOW (ref 1.7–2.4)

## 2020-03-04 LAB — PHOSPHORUS: Phosphorus: 3.3 mg/dL (ref 2.5–4.6)

## 2020-03-04 MED ORDER — CALCIUM GLUCONATE-NACL 2-0.675 GM/100ML-% IV SOLN
2.0000 g | Freq: Once | INTRAVENOUS | Status: AC
  Administered 2020-03-04: 2000 mg via INTRAVENOUS
  Filled 2020-03-04: qty 100

## 2020-03-04 MED ORDER — MAGNESIUM SULFATE 2 GM/50ML IV SOLN
2.0000 g | Freq: Once | INTRAVENOUS | Status: AC
  Administered 2020-03-04: 2 g via INTRAVENOUS
  Filled 2020-03-04: qty 50

## 2020-03-04 MED ORDER — INSULIN ASPART 100 UNIT/ML ~~LOC~~ SOLN
0.0000 [IU] | SUBCUTANEOUS | Status: DC
  Administered 2020-03-04: 3 [IU] via SUBCUTANEOUS
  Administered 2020-03-04: 11 [IU] via SUBCUTANEOUS
  Administered 2020-03-05 – 2020-03-06 (×5): 3 [IU] via SUBCUTANEOUS
  Administered 2020-03-07 – 2020-03-08 (×2): 4 [IU] via SUBCUTANEOUS
  Filled 2020-03-04 (×9): qty 1

## 2020-03-04 MED FILL — Medication: Qty: 1 | Status: AC

## 2020-03-04 NOTE — Plan of Care (Signed)

## 2020-03-04 NOTE — Progress Notes (Addendum)
PHARMACY - PHYSICIAN COMMUNICATION CRITICAL VALUE ALERT - BLOOD CULTURE IDENTIFICATION (BCID)  Keevin A Mederos is an 59 y.o. male who presented to [facility] on 03/02/2020 with a chief complaint of multi-organ failure  Assessment:  Per lab, second bottle of 4 w/ GPR  Name of physician (or Provider) ContactedLeanord Asal, NP  Current antibiotics: none  Changes to prescribed antibiotics recommended: Ancef 2gm IV q8hrs per pharmacy protocol  Results for orders placed or performed during the hospital encounter of 03/02/20  Blood Culture ID Panel (Reflexed) (Collected: 03/02/2020  8:01 PM)  Result Value Ref Range   Enterococcus species NOT DETECTED NOT DETECTED   Listeria monocytogenes NOT DETECTED NOT DETECTED   Staphylococcus species DETECTED (A) NOT DETECTED   Staphylococcus aureus (BCID) NOT DETECTED NOT DETECTED   Methicillin resistance NOT DETECTED NOT DETECTED   Streptococcus species NOT DETECTED NOT DETECTED   Streptococcus agalactiae NOT DETECTED NOT DETECTED   Streptococcus pneumoniae NOT DETECTED NOT DETECTED   Streptococcus pyogenes NOT DETECTED NOT DETECTED   Acinetobacter baumannii NOT DETECTED NOT DETECTED   Enterobacteriaceae species NOT DETECTED NOT DETECTED   Enterobacter cloacae complex NOT DETECTED NOT DETECTED   Escherichia coli NOT DETECTED NOT DETECTED   Klebsiella oxytoca NOT DETECTED NOT DETECTED   Klebsiella pneumoniae NOT DETECTED NOT DETECTED   Proteus species NOT DETECTED NOT DETECTED   Serratia marcescens NOT DETECTED NOT DETECTED   Haemophilus influenzae NOT DETECTED NOT DETECTED   Neisseria meningitidis NOT DETECTED NOT DETECTED   Pseudomonas aeruginosa NOT DETECTED NOT DETECTED   Candida albicans NOT DETECTED NOT DETECTED   Candida glabrata NOT DETECTED NOT DETECTED   Candida krusei NOT DETECTED NOT DETECTED   Candida parapsilosis NOT DETECTED NOT DETECTED   Candida tropicalis NOT DETECTED NOT DETECTED    [name] A 03/04/2020  10:20 PM

## 2020-03-04 NOTE — Progress Notes (Addendum)
Shift summary:  - Weaning sedation and pressors as tolerated. - Patient placed in PSV this AM and is tolerating well.  - Patient successfully extubated to [place] at 1200 hrs. - Awake and following commands. - Plan for SLP eval tomorrow.

## 2020-03-04 NOTE — Progress Notes (Signed)
CRITICAL CARE NOTE 59 yo male admitted s/p PEA arrest following a grand mal seizure with acute renal failure with hyperkalemia, severe metabolic acidosis, and cardiogenic shock requiring mechanical intubation and vasopressors   SIGNIFICANT EVENTS/STUDIES:  06/9: Pt admitted to ICU mechanically intubated following a PEA arrest 06/9: severe cardiogenic shock and multiorgan failure,  6/9 ART line and CVL placed +SEIZURES  CC  follow up respiratory failure  SUBJECTIVE Patient remains critically ill Prognosis is guarded   BP (!) 124/99    Pulse (!) 102    Temp 99.7 F (37.6 C)    Resp 19    Ht 6' (1.829 m)    Wt 121.6 kg    SpO2 92%    BMI 36.36 kg/m    I/O last 3 completed shifts: In: 12291.4 [I.V.:9101; IV Piggyback:3190.4] Out: 3360 [Urine:3360] Total I/O In: 885.9 [I.V.:460.8; Other:120; IV Piggyback:305.1] Out: 515 [Urine:515]  SpO2: 92 % FiO2 (%): 28 %  Estimated body mass index is 36.36 kg/m as calculated from the following:   Height as of this encounter: 6' (1.829 m).   Weight as of this encounter: 121.6 kg.  SIGNIFICANT EVENTS   REVIEW OF SYSTEMS  PATIENT IS UNABLE TO PROVIDE COMPLETE REVIEW OF SYSTEMS DUE TO SEVERE CRITICAL ILLNESS        PHYSICAL EXAMINATION:  GENERAL:critically ill appearing,  HEAD: Normocephalic, atraumatic.  EYES: Pupils equal, round, reactive to light.  No scleral icterus.  MOUTH: Moist mucosal membrane. NECK: Supple.  PULMONARY:intubated +rhonchi CARDIOVASCULAR: S1 and S2. Regular rate and rhythm. No murmurs, rubs, or gallops.  GASTROINTESTINAL: Soft, nontender, -distended.  Positive bowel sounds.   MUSCULOSKELETAL: No swelling, clubbing, or edema.  NEUROLOGIC: following commands SKIN:intact,warm,dry  MEDICATIONS: I have reviewed all medications and confirmed regimen as documented   CULTURE RESULTS   Recent Results (from the past 240 hour(s))  Blood Culture (routine x 2)     Status: Abnormal (Preliminary result)    Collection Time: 03/02/20  8:01 PM   Specimen: BLOOD  Result Value Ref Range Status   Specimen Description   Final    BLOOD LEFT ANTECUBITAL Performed at [facility], [address] [place], [place] [address]    Special Requests   Final    BOTTLES DRAWN AEROBIC AND ANAEROBIC Blood Culture adequate volume Performed at [facility], [address] [place], [place] [address]    Culture  Setup Time   Final    GRAM POSITIVE COCCI ANAEROBIC BOTTLE ONLY CRITICAL RESULT CALLED TO, READ BACK BY AND VERIFIED WITH: WALID NAZARI AT 1958 03/03/20 BY ACR.PMF    Culture (A)  Final    STAPHYLOCOCCUS SPECIES (COAGULASE NEGATIVE) THE SIGNIFICANCE OF ISOLATING THIS ORGANISM FROM A SINGLE SET OF BLOOD CULTURES WHEN MULTIPLE SETS ARE DRAWN IS UNCERTAIN. PLEASE NOTIFY THE MICROBIOLOGY DEPARTMENT WITHIN ONE WEEK IF SPECIATION AND SENSITIVITIES ARE REQUIRED. Performed at [facility], [place] [address] [place], [place] [address]    Report Status PENDING  Incomplete  Blood Culture ID Panel (Reflexed)     Status: Abnormal   Collection Time: 03/02/20  8:01 PM  Result Value Ref Range Status   Enterococcus species NOT DETECTED NOT DETECTED Final   Listeria monocytogenes NOT DETECTED NOT DETECTED Final   Staphylococcus species DETECTED (A) NOT DETECTED Final    Comment: Methicillin (oxacillin) susceptible coagulase negative staphylococcus. Possible blood culture contaminant (unless isolated from more than one blood culture draw or clinical case suggests pathogenicity). No antibiotic treatment is indicated for blood  culture contaminants. CRITICAL RESULT CALLED TO, READ BACK BY AND VERIFIED WITH: Mila Merry 03/03/2020 @1958  BY ACR    Staphylococcus aureus (BCID) NOT DETECTED NOT DETECTED Final   Methicillin resistance NOT DETECTED NOT DETECTED Final   Streptococcus species NOT DETECTED NOT DETECTED Final   Streptococcus agalactiae NOT DETECTED NOT DETECTED Final   Streptococcus  pneumoniae NOT DETECTED NOT DETECTED Final   Streptococcus pyogenes NOT DETECTED NOT DETECTED Final   Acinetobacter baumannii NOT DETECTED NOT DETECTED Final   Enterobacteriaceae species NOT DETECTED NOT DETECTED Final   Enterobacter cloacae complex NOT DETECTED NOT DETECTED Final   Escherichia coli NOT DETECTED NOT DETECTED Final   Klebsiella oxytoca NOT DETECTED NOT DETECTED Final   Klebsiella pneumoniae NOT DETECTED NOT DETECTED Final   Proteus species NOT DETECTED NOT DETECTED Final   Serratia marcescens NOT DETECTED NOT DETECTED Final   Haemophilus influenzae NOT DETECTED NOT DETECTED Final   Neisseria meningitidis NOT DETECTED NOT DETECTED Final   Pseudomonas aeruginosa NOT DETECTED NOT DETECTED Final   Candida albicans NOT DETECTED NOT DETECTED Final   Candida glabrata NOT DETECTED NOT DETECTED Final   Candida krusei NOT DETECTED NOT DETECTED Final   Candida parapsilosis NOT DETECTED NOT DETECTED Final   Candida tropicalis NOT DETECTED NOT DETECTED Final    Comment: Performed at [facility], [address] [place], [place]  Blood Culture (routine x 2)     Status: None (Preliminary result)   Collection Time: 03/02/20  8:06 PM   Specimen: BLOOD  Result Value Ref Range Status   Specimen Description BLOOD  Final   Special Requests BOTTLES DRAWN AEROBIC AND ANAEROBIC  Final   Culture   Final    NO GROWTH 2 DAYS Performed at [facility], [address], [place], [place]    Report Status PENDING  Incomplete  SARS Coronavirus 2 by RT PCR (hospital order, performed in [facility] hospital lab) Nasopharyngeal Nasopharyngeal Swab     Status: None   Collection Time: 03/02/20  8:55 PM   Specimen: Nasopharyngeal Swab  Result Value Ref Range Status   SARS Coronavirus 2 NEGATIVE NEGATIVE Final    Comment: (NOTE) SARS-CoV-2 target nucleic acids are NOT DETECTED. The SARS-CoV-2 RNA is generally detectable in upper and lower respiratory specimens  during the acute phase of infection. The lowest concentration of SARS-CoV-2 viral copies this assay can detect is 250 copies / mL. A negative result does not preclude SARS-CoV-2 infection and should not be used as the sole basis for treatment or other patient management decisions.  A negative result may occur with improper specimen collection / handling, submission of specimen other than nasopharyngeal swab, presence of viral mutation(s) within the areas targeted by this assay, and inadequate number of viral copies (<250 copies / mL). A negative result must be combined with clinical observations, patient history, and epidemiological information. Fact Sheet for Patients:   05/02/20 Fact Sheet for Healthcare Providers: BoilerBrush.com.cy This test is not yet approved or cleared  by the https://pope.com/ FDA and has been authorized for detection and/or diagnosis of SARS-CoV-2 by FDA under an Emergency Use Authorization (EUA).  This EUA will remain in effect (meaning this test can be used) for the duration of the COVID-19 declaration under Section 564(b)(1) of the Act, 21 U.S.C. section 360bbb-3(b)(1), unless the authorization is terminated or revoked sooner. Performed at [facility], [address], [place], [place]   MRSA PCR Screening     Status:  None   Collection Time: 03/02/20 10:30 PM   Specimen: Nasopharyngeal  Result Value Ref Range Status   MRSA by PCR NEGATIVE NEGATIVE Final    Comment:        The GeneXpert MRSA Assay (FDA approved for NASAL specimens only), is one component of a comprehensive MRSA colonization surveillance program. It is not intended to diagnose MRSA infection nor to guide or monitor treatment for MRSA infections. Performed at [facility], [address], [place], [place] [address]   Urine culture     Status: None   Collection Time: 03/03/20  4:40 AM   Specimen:  In/Out Cath Urine  Result Value Ref Range Status   Specimen Description   Final    IN/OUT CATH URINE Performed at [facility], [address], [place], [place] [address]    Special Requests   Final    NONE Performed at [facility], [address], [place], [place] [address]    Culture   Final    NO GROWTH Performed at [facility], [address] [place], [place] [address]    Report Status 03/04/2020 FINAL  Final          IMAGING    No results found.   Nutrition Status:           Indwelling Urinary Catheter continued, requirement due to   Reason to continue Indwelling Urinary Catheter strict Intake/Output monitoring for hemodynamic instability   Central Line/ continued, requirement due to  Reason to continue [employer] Monitoring of central venous pressure or other hemodynamic parameters and poor IV access   Ventilator Assess SBT   Ventilator Sedation RASS 0       ASSESSMENT AND PLAN SYNOPSIS  Severe ACUTE Hypoxic and Hypercapnic Respiratory Failure from acute cardiac arrest with aspiration pneumonia and seizures h/o ETOH abuse   Severe ACUTE Hypoxic and Hypercapnic Respiratory Failure -remove sedation -assess SBT -continue Bronchodilator Therapy -Wean Fio2 and PEEP as tolerated -VAP/VENT bundle implementation -Morbid obesity, possible OSA.  Will certainly impact respiratory mechanics, ventilator weaning  ACUTE SYSTOLIC CARDIAC ARREST FAILURE-CHECK ECHO Vent  Support   ACUTE KIDNEY INJURY/Renal Failure -improving creatinine -continue Foley Catheter-assess need -Avoid nephrotoxic agents -Follow urine output, BMP -Ensure adequate renal perfusion, optimize oxygenation -Renal dose medications     NEUROLOGY seizures Obtain EEG at some time Following commands   SHOCK-SEPSIS/CARDIOGENIC -use vasopressors to keep MAP>65 -follow ABG and LA -follow up cultures -emperic ABX -consider stress dose  steroids -aggressive IV fluid resuscitation  CARDIAC ICU monitoring  ID -continue IV abx as prescibed -follow up cultures  GI GI PROPHYLAXIS as indicated   DIET-->TF's as tolerated Constipation protocol as indicated  ENDO - will use ICU hypoglycemic\Hyperglycemia protocol if indicated     ELECTROLYTES -follow labs as needed -replace as needed -pharmacy consultation and following   DVT/GI PRX ordered and assessed TRANSFUSIONS AS NEEDED MONITOR FSBS I Assessed the need for Labs I Assessed the need for Foley I Assessed the need for Central Venous Line Family Discussion when available I Assessed the need for Mobilization I made an Assessment of medications to be adjusted accordingly Safety Risk assessment completed   CASE DISCUSSED IN MULTIDISCIPLINARY ROUNDS WITH ICU TEAM  Critical Care Time devoted to patient care services described in this note is 35 minutes.   Overall, patient is critically ill, prognosis is guarded.  Patient with Multiorgan failure and at high risk for cardiac arrest and death.    //Vonzell Lindblad

## 2020-03-04 NOTE — Progress Notes (Signed)
Pt was suctioned prior to extubation for a small amount of secretions. He was extubated without incident to room air.

## 2020-03-04 NOTE — Progress Notes (Signed)
Palliative: Mr. Earnhart was extubated today.  He continues to look weak and frail, but is able to make eye contact.  He answers questions with one-word responses.  There is no family at bedside at this time.  Call to uncle Peyton Najjar.  Left voicemail message about extubation, possibility of PT eval and rehab.  Conference with attending, bedside nursing staff related to patient condition, needs, goals of care.   Plan: At this point Mr. Tester has been extubated, and is able to answer questions.  I anticipate if he continues to improve he would need physical therapy, and may qualify for inpatient rehab.  25 minutes  Lillia Carmel, NP Palliative medicine team Team phone [phone] Greater than 50% of this time was spent counseling and coordinating care related to the above assessment and plan.

## 2020-03-05 ENCOUNTER — Inpatient Hospital Stay (HOSPITAL_COMMUNITY)
Admit: 2020-03-05 | Discharge: 2020-03-05 | Disposition: A | Discharge status: Home / Self Care | Payer: Medicaid Other | Attending: Pulmonary Disease | Admitting: Pulmonary Disease

## 2020-03-05 DIAGNOSIS — R569 Unspecified convulsions: Secondary | ICD-10-CM

## 2020-03-05 DIAGNOSIS — R570 Cardiogenic shock: Secondary | ICD-10-CM

## 2020-03-05 DIAGNOSIS — G9340 Encephalopathy, unspecified: Secondary | ICD-10-CM

## 2020-03-05 DIAGNOSIS — N171 Acute kidney failure with acute cortical necrosis: Secondary | ICD-10-CM

## 2020-03-05 LAB — BASIC METABOLIC PANEL
Anion gap: 12 (ref 5–15)
BUN: 29 mg/dL — ABNORMAL HIGH (ref 6–20)
CO2: 32 mmol/L (ref 22–32)
Calcium: 7.2 mg/dL — ABNORMAL LOW (ref 8.9–10.3)
Chloride: 101 mmol/L (ref 98–111)
Creatinine, Ser: 1.5 mg/dL — ABNORMAL HIGH (ref 0.61–1.24)
GFR calc Af Amer: 58 mL/min — ABNORMAL LOW (ref >60)
GFR calc non Af Amer: 50 mL/min — ABNORMAL LOW (ref >60)
Glucose, Bld: 115 mg/dL — ABNORMAL HIGH (ref 70–99)
Potassium: 3.4 mmol/L — ABNORMAL LOW (ref 3.5–5.1)
Sodium: 145 mmol/L (ref 135–145)

## 2020-03-05 LAB — GLUCOSE, CAPILLARY
Glucose-Capillary: 113 mg/dL — ABNORMAL HIGH (ref 70–99)
Glucose-Capillary: 120 mg/dL — ABNORMAL HIGH (ref 70–99)
Glucose-Capillary: 123 mg/dL — ABNORMAL HIGH (ref 70–99)
Glucose-Capillary: 129 mg/dL — ABNORMAL HIGH (ref 70–99)
Glucose-Capillary: 129 mg/dL — ABNORMAL HIGH (ref 70–99)
Glucose-Capillary: 135 mg/dL — ABNORMAL HIGH (ref 70–99)

## 2020-03-05 LAB — CBC
HCT: 28.8 % — ABNORMAL LOW (ref 39.0–52.0)
Hemoglobin: 9.3 g/dL — ABNORMAL LOW (ref 13.0–17.0)
MCH: 29.2 pg (ref 26.0–34.0)
MCHC: 32.3 g/dL (ref 30.0–36.0)
MCV: 90.3 fL (ref 80.0–100.0)
Platelets: 103 10*3/uL — ABNORMAL LOW (ref 150–400)
RBC: 3.19 MIL/uL — ABNORMAL LOW (ref 4.22–5.81)
RDW: 14.9 % (ref 11.5–15.5)
WBC: 7.4 10*3/uL (ref 4.0–10.5)
nRBC: 0 % (ref 0.0–0.2)

## 2020-03-05 LAB — ECHOCARDIOGRAM COMPLETE
Height: 72 in
Weight: 4215.2 oz

## 2020-03-05 MED ORDER — CEFAZOLIN SODIUM-DEXTROSE 2-4 GM/100ML-% IV SOLN
2.0000 g | Freq: Three times a day (TID) | INTRAVENOUS | Status: DC
  Administered 2020-03-05 – 2020-03-08 (×9): 2 g via INTRAVENOUS
  Filled 2020-03-05 (×15): qty 100

## 2020-03-05 MED ORDER — POTASSIUM CHLORIDE 10 MEQ/50ML IV SOLN
10.0000 meq | INTRAVENOUS | Status: AC
  Administered 2020-03-05 (×2): 10 meq via INTRAVENOUS
  Filled 2020-03-05 (×4): qty 50

## 2020-03-05 MED ORDER — PERFLUTREN LIPID MICROSPHERE
1.0000 mL | INTRAVENOUS | Status: AC | PRN
  Administered 2020-03-05: 4.5 mL via INTRAVENOUS
  Filled 2020-03-05: qty 10

## 2020-03-05 NOTE — Progress Notes (Signed)
Pharmacy Antibiotic Note  Brad Graham is a 59 y.o. male admitted on 03/02/2020 with bacteremia.  Pharmacy has been consulted for Ancef dosing.  Plan: Ancef 2gm IV q8hrs  Height: 6' (182.9 cm) Weight: 121.6 kg (268 lb 1.3 oz) IBW/kg (Calculated) : 77.6  Temp (24hrs), Avg:98.9 F (37.2 C), Min:98.1 F (36.7 C), Max:99.9 F (37.7 C)  Recent Labs  Lab 03/02/20 2001 03/02/20 2001 03/02/20 2036 03/02/20 2311 03/03/20 0547 03/03/20 1353 03/04/20 0438  WBC 8.0  --   --   --  11.4*  --  6.0  CREATININE 3.95*   < > 3.93* 3.62* 3.57* 3.35* 2.21*  LATICACIDVEN 4.5*  --   --  1.4  --   --   --    < > = values in this interval not displayed.    Estimated Creatinine Clearance: 47.9 mL/min (A) (by C-G formula based on SCr of 2.21 mg/dL (H)).    No Known Allergies  Antimicrobials this admission:   >>    >>   Dose adjustments this admission:   Microbiology results:  BCx:   UCx:    Sputum:    MRSA PCR:   Thank you for allowing pharmacy to be a part of this patient's care.  [name] A 03/05/2020 12:43 AM

## 2020-03-05 NOTE — Evaluation (Signed)
Clinical/Bedside Swallow Evaluation Patient Details  Name: Brad Graham MRN: [id number] Date of Birth: 1961/05/05  Today's Date: 03/05/2020 Time: SLP Start Time (ACUTE ONLY): 1000 SLP Stop Time (ACUTE ONLY): 1059 SLP Time Calculation (min) (ACUTE ONLY): 59 min  Past Medical History:  Past Medical History:  Diagnosis Date  . Alcohol abuse   . Arthritis   . H/O: CVA (cerebrovascular accident)   . Hyperlipidemia   . Hypertension   . Syncope 07/02/2016  . Syncope and collapse 01/27/2020   Past Surgical History:  Past Surgical History:  Procedure Laterality Date  . none     HPI:  Per admitting H and P: "This is a 59 yo male with a PMH of Syncopal Episode, HTN, HLD, CVA, Recently Discharged from [facility] 01/28/2020 following treatment of unresponsiveness secondary to seizure sctivity suspected due to ETOH withdrawal (pt was not discharged on anticonvulsants and pt refused MRI Brain during hospitalization), Arthritis, and Severe ETOH Abuse.  He presented to [facility] ER from [facility] on 06/9 with altered mental status.  Per ER notes when EMS arrived at pts residence he was initially awake, but then began having agonal respirations followed by a grand mal seizure.  Initial cardiac rhythm PEA, therefore ACLS protocol initiated with ROSC following 2-3 minutes of CPR.  Upon arrival to the ER pt noted to have agonal respirations requiring mechanical intubation.  Per ER physician following intubation pt began opening his eyes and purposeful movement present (attempted to follow commands). ER vital signs were: bp 61/48, hr 79, rr 21, PO2 92%, and temp 87 degrees F.  Lab results were: K+ 6.4, CO2 7, BUN 56, creatinine 3.95, glucose 177, anion gap 26, lactic acid 4.5, hgb 9.9, alcohol level <10, and abg pH 6.94/pCO2 40/pO2 395/acid-base deficit 22.6/bicarb 8.6.  CXR and COVID-19 negative.  ER EKG: atrial fibrillation, hr 96 bpm, slight ST depression.    PCCM team contacted by ER provider for additional  workup and treatment.     Assessment / Plan / Recommendation Clinical Impression  Bedside swallow eval today revealed Pt to be at high risk for aspiration. Pt was recently intubated after 03/02/2020 after grand mal seizure. Extubated 03/04/2020. Today, Pt presents with congested nonproductive cough and shallow breathing during BSE. Oral mech revealed structures to be functioning adequately for PO's. After several minutes of Pt attempting to clear airway, Pt was given ice chips one at a time which he tolerated well. When presented with 2 bites of applesauce, Pt was able to propel boluses for a swallow with min oral transit delay and min oral residue. After a few minutes, Pt again attempted to cough and clear his throat with minimal success. Pts cough is weak and inefficient which puts him at a high risk for aspiration at this time. Rec continue NPO in hopes of improves strength and alertness. Will reassess on Monday and determine if MBSS is needed prior to initiating a diet. Pt may have ice chips in moderation if oral cavity is clean. Discussed with Nsg and Pt. SLP Visit Diagnosis: Dysphagia, oropharyngeal phase (R13.12)    Aspiration Risk  Moderate aspiration risk;Severe aspiration risk    Diet Recommendation NPO;Other (Comment) (ice chips after oral care. If necessary, meds crushed in app)   Medication Administration: Via alternative means    Other  Recommendations Oral Care Recommendations: Oral care prior to ice chip/H20   Follow up Recommendations Skilled Nursing facility      Frequency and Duration min 3x week  1 week       Prognosis    Fair    Swallow Study   General Date of Onset: 03/02/20 HPI: Per admitting H and P: "This is a 59 yo male with a PMH of Syncopal Episode, HTN, HLD, CVA, Recently Discharged from [facility] 01/28/2020 following treatment of unresponsiveness secondary to seizure sctivity suspected due to ETOH withdrawal (pt was not discharged on anticonvulsants and pt refused  MRI Brain during hospitalization), Arthritis, and Severe ETOH Abuse.  He presented to [facility] ER from a [facility] via EMS on 06/9 with altered mental status.  Per ER notes when EMS arrived at pts residence he was initially awake, but then began having agonal respirations followed by a grand mal seizure.  Initial cardiac rhythm PEA, therefore ACLS protocol initiated with ROSC following 2-3 minutes of CPR.  Upon arrival to the ER pt noted to have agonal respirations requiring mechanical intubation.  Per ER physician following intubation pt began opening his eyes and purposeful movement present (attempted to follow commands). ER vital signs were: bp 61/48, hr 79, rr 21, PO2 92%, and temp 87 degrees F.  Lab results were: K+ 6.4, CO2 7, BUN 56, creatinine 3.95, glucose 177, anion gap 26, lactic acid 4.5, hgb 9.9, alcohol level <10, and abg pH 6.94/pCO2 40/pO2 395/acid-base deficit 22.6/bicarb 8.6.  CXR and COVID-19 negative.  ER EKG: atrial fibrillation, hr 96 bpm, slight ST depression.    PCCM team contacted by ER provider for additional workup and treatment.   Type of Study: Bedside Swallow Evaluation Diet Prior to this Study: NPO Temperature Spikes Noted: No Respiratory Status: Nasal cannula History of Recent Intubation: Yes Length of Intubations (days): 2 days Date extubated: 03/04/20 Behavior/Cognition: Cooperative;Lethargic/Drowsy Oral Cavity - Dentition: Poor condition;Missing dentition Self-Feeding Abilities: Total assist;Other (Comment) (Pt has mittens on to prevent pulling off medical devices) Patient Positioning: Upright in bed Baseline Vocal Quality: Wet;Hoarse;Low vocal intensity Volitional Cough: Weak;Congested;Wet Volitional Swallow: Able to elicit    Oral/Motor/Sensory Function Overall Oral Motor/Sensory Function: Within functional limits   Ice Chips Ice chips: Within functional limits Presentation: Spoon   Thin Liquid Thin Liquid: Not tested    Nectar Thick     Honey Thick     Puree  Puree: Impaired Presentation: Spoon Oral Phase Functional Implications: Prolonged oral transit Pharyngeal Phase Impairments: Cough - Delayed   Solid     Solid: Not tested      Eather Colas 03/05/2020,10:59 AM

## 2020-03-05 NOTE — Progress Notes (Signed)
CRITICAL CARE NOTE 59 yo male admitted s/p PEA arrest following a grand mal seizure with acute renal failure with hyperkalemia, severe metabolic acidosis, and cardiogenic shock requiring mechanical intubation and vasopressors   SIGNIFICANT EVENTS/STUDIES:  06/9: Pt admitted to ICU mechanically intubated following a PEA arrest 06/9: severe cardiogenic shock and multiorgan failure,  6/9 ART line and CVL placed +SEIZURES  CC  follow up respiratory failure  SUBJECTIVE Patient remains critically ill Prognosis is guarded   BP (!) 141/87   Pulse 92   Temp 99.7 F (37.6 C)   Resp (!) 21   Ht 6' (1.829 m)   Wt 119.5 kg   SpO2 98%   BMI 35.73 kg/m    I/O last 3 completed shifts: In: 3252.2 [I.V.:2737.1; IV Piggyback:515.1] Out: 4730 [Urine:4730] Total I/O In: 162.2 [I.V.:12.1; IV Piggyback:150.1] Out: 300 [Urine:300]  SpO2: 98 % FiO2 (%): 28 %  Estimated body mass index is 35.73 kg/m as calculated from the following:   Height as of this encounter: 6' (1.829 m).   Weight as of this encounter: 119.5 kg.  SIGNIFICANT EVENTS   REVIEW OF SYSTEMS  PATIENT IS UNABLE TO PROVIDE COMPLETE REVIEW OF SYSTEMS DUE TO SEVERE CRITICAL ILLNESS        PHYSICAL EXAMINATION:  GENERAL:comfortable,  HEAD: Normocephalic, atraumatic.  EYES: Pupils equal, round, reactive to light.  No scleral icterus.  MOUTH: Moist mucosal membrane. NECK: Supple.  PULMONARY:extubated, few scattered rhonchi CARDIOVASCULAR: S1 and S2. Regular rate and rhythm. No murmurs, rubs, or gallops.  GASTROINTESTINAL: Soft, nontender, -distended.  Positive bowel sounds.   MUSCULOSKELETAL: No swelling, clubbing, or edema.  NEUROLOGIC: following commands, mildly confused SKIN:intact,warm,dry  MEDICATIONS: I have reviewed all medications and confirmed regimen as documented   CULTURE RESULTS   Recent Results (from the past 240 hour(s))  Blood Culture (routine x 2)     Status: Abnormal (Preliminary result)    Collection Time: 03/02/20  8:01 PM   Specimen: BLOOD  Result Value Ref Range Status   Specimen Description   Final    BLOOD LEFT ANTECUBITAL Performed at [facility], [address], [place], [place] [address]    Special Requests   Final    BOTTLES DRAWN AEROBIC AND ANAEROBIC Blood Culture adequate volume Performed at [facility], [address] [place], [place] [address]    Culture  Setup Time   Final    GRAM POSITIVE COCCI ANAEROBIC BOTTLE ONLY CRITICAL RESULT CALLED TO, READ BACK BY AND VERIFIED WITH: WALID NAZARI AT 1958 03/03/20 BY ACR.PMF AEROBIC BOTTLE ONLY GRAM POSITIVE RODS CRITICAL RESULT CALLED TO, READ BACK BY AND VERIFIED WITH: SCOTT HALL ON 03/04/20 AT 2212 [facility] Performed at [facility], [address] [place], [place] [address]    Culture (A)  Final    STAPHYLOCOCCUS SPECIES (COAGULASE NEGATIVE) THE SIGNIFICANCE OF ISOLATING THIS ORGANISM FROM A SINGLE SET OF BLOOD CULTURES WHEN MULTIPLE SETS ARE DRAWN IS UNCERTAIN. PLEASE NOTIFY THE MICROBIOLOGY DEPARTMENT WITHIN ONE WEEK IF SPECIATION AND SENSITIVITIES ARE REQUIRED. CULTURE REINCUBATED FOR BETTER GROWTH Performed at [facility], [address] [place], [place] [address]    Report Status PENDING  Incomplete  Blood Culture ID Panel (Reflexed)     Status: Abnormal   Collection Time: 03/02/20  8:01 PM  Result Value Ref Range Status   Enterococcus species NOT DETECTED NOT DETECTED Final   Listeria monocytogenes NOT DETECTED NOT DETECTED Final   Staphylococcus species DETECTED (A) NOT DETECTED Final  Comment: Methicillin (oxacillin) susceptible coagulase negative staphylococcus. Possible blood culture contaminant (unless isolated from more than one blood culture draw or clinical case suggests pathogenicity). No antibiotic treatment is indicated for blood  culture contaminants. CRITICAL RESULT CALLED TO, READ BACK BY AND VERIFIED WITH: Mila Merry 03/03/2020 @1958  BY ACR     Staphylococcus aureus (BCID) NOT DETECTED NOT DETECTED Final   Methicillin resistance NOT DETECTED NOT DETECTED Final   Streptococcus species NOT DETECTED NOT DETECTED Final   Streptococcus agalactiae NOT DETECTED NOT DETECTED Final   Streptococcus pneumoniae NOT DETECTED NOT DETECTED Final   Streptococcus pyogenes NOT DETECTED NOT DETECTED Final   Acinetobacter baumannii NOT DETECTED NOT DETECTED Final   Enterobacteriaceae species NOT DETECTED NOT DETECTED Final   Enterobacter cloacae complex NOT DETECTED NOT DETECTED Final   Escherichia coli NOT DETECTED NOT DETECTED Final   Klebsiella oxytoca NOT DETECTED NOT DETECTED Final   Klebsiella pneumoniae NOT DETECTED NOT DETECTED Final   Proteus species NOT DETECTED NOT DETECTED Final   Serratia marcescens NOT DETECTED NOT DETECTED Final   Haemophilus influenzae NOT DETECTED NOT DETECTED Final   Neisseria meningitidis NOT DETECTED NOT DETECTED Final   Pseudomonas aeruginosa NOT DETECTED NOT DETECTED Final   Candida albicans NOT DETECTED NOT DETECTED Final   Candida glabrata NOT DETECTED NOT DETECTED Final   Candida krusei NOT DETECTED NOT DETECTED Final   Candida parapsilosis NOT DETECTED NOT DETECTED Final   Candida tropicalis NOT DETECTED NOT DETECTED Final    Comment: Performed at [facility], [address] [place], [place]  Blood Culture (routine x 2)     Status: None (Preliminary result)   Collection Time: 03/02/20  8:06 PM   Specimen: BLOOD  Result Value Ref Range Status   Specimen Description BLOOD  Final   Special Requests BOTTLES DRAWN AEROBIC AND ANAEROBIC  Final   Culture   Final    NO GROWTH 3 DAYS Performed at [facility], [address] [place], [place]    Report Status PENDING  Incomplete  SARS Coronavirus 2 by RT PCR (hospital order, performed in [facility] hospital lab) Nasopharyngeal Nasopharyngeal Swab     Status: None   Collection Time: 03/02/20  8:55 PM   Specimen:  Nasopharyngeal Swab  Result Value Ref Range Status   SARS Coronavirus 2 NEGATIVE NEGATIVE Final    Comment: (NOTE) SARS-CoV-2 target nucleic acids are NOT DETECTED. The SARS-CoV-2 RNA is generally detectable in upper and lower respiratory specimens during the acute phase of infection. The lowest concentration of SARS-CoV-2 viral copies this assay can detect is 250 copies / mL. A negative result does not preclude SARS-CoV-2 infection and should not be used as the sole basis for treatment or other patient management decisions.  A negative result may occur with improper specimen collection / handling, submission of specimen other than nasopharyngeal swab, presence of viral mutation(s) within the areas targeted by this assay, and inadequate number of viral copies (<250 copies / mL). A negative result must be combined with clinical observations, patient history, and epidemiological information. Fact Sheet for Patients:   05/02/20 Fact Sheet for Healthcare Providers: BoilerBrush.com.cy This test is not yet approved or cleared  by the https://pope.com/ FDA and has been authorized for detection and/or diagnosis of SARS-CoV-2 by FDA under an Emergency Use Authorization (EUA).  This EUA will remain in effect (meaning this test can be used) for the duration of the COVID-19 declaration under Section 564(b)(1) of the Act, 21  U.S.C. section 360bbb-3(b)(1), unless the authorization is terminated or revoked sooner. Performed at [facility], [address] [place], [place] [address]   MRSA PCR Screening     Status: None   Collection Time: 03/02/20 10:30 PM   Specimen: Nasopharyngeal  Result Value Ref Range Status   MRSA by PCR NEGATIVE NEGATIVE Final    Comment:        The GeneXpert MRSA Assay (FDA approved for NASAL specimens only), is one component of a comprehensive MRSA colonization surveillance program. It is not intended to  diagnose MRSA infection nor to guide or monitor treatment for MRSA infections. Performed at [facility], [address] [place], [place] [address]   Urine culture     Status: None   Collection Time: 03/03/20  4:40 AM   Specimen: In/Out Cath Urine  Result Value Ref Range Status   Specimen Description   Final    IN/OUT CATH URINE Performed at [facility], [address], [place], [place] [address]    Special Requests   Final    NONE Performed at [facility], [address] [place], [place] [address]    Culture   Final    NO GROWTH Performed at [facility], [employer] [address] [place], [place] [address]    Report Status 03/04/2020 FINAL  Final          IMAGING    No results found.   Nutrition Status:           Indwelling Urinary Catheter continued, requirement due to   Reason to continue Indwelling Urinary Catheter strict Intake/Output monitoring for hemodynamic instability   Central Line/ continued, requirement due to  Reason to continue [place] of central venous pressure or other hemodynamic parameters and poor IV access     ASSESSMENT AND PLAN SYNOPSIS  Severe ACUTE Hypoxic and Hypercapnic Respiratory Failure from acute cardiac arrest with aspiration pneumonia and seizures h/o ETOH abuse   Severe ACUTE Hypoxic and Hypercapnic Respiratory Failure Extubated yesterday and doing well from this perspective  [place] ECHO Maintain even volume status Afterload and rate control    ACUTE KIDNEY INJURY/Renal Failure -improving creatinine down to 1.5 today -continue Foley Catheter-assess need -Avoid nephrotoxic agents -Follow urine output, BMP -Ensure adequate renal perfusion, optimize oxygenation -Renal dose medications     NEUROLOGY Seizures, on dilantin  Obtain EEG at some time Following commands and  conversant   SHOCK-SEPSIS/CARDIOGENIC -resolved  CARDIAC tele   ID -continue IV abx as prescibed -follow up cultures  GI GI PROPHYLAXIS as indicated   DIET-->TF's as tolerated Constipation protocol as indicated  ENDO - will use ICU hypoglycemic\Hyperglycemia protocol if indicated    ELECTROLYTES -follow labs as needed -replace as needed -pharmacy consultation and following   DVT/GI PRX ordered and assessed TRANSFUSIONS AS NEEDED MONITOR FSBS I Assessed the need for Labs I Assessed the need for Foley I Assessed the need for Central Venous Line Family Discussion when available I Assessed the need for Mobilization I made an Assessment of medications to be adjusted accordingly Safety Risk assessment completed   DISPOSITION: transfer to med-tele    //Paulla Mcclaskey

## 2020-03-05 NOTE — Progress Notes (Addendum)
Shift summary:  - Foley and A-line removed this AM. - Patient remains NPO following SLP eval. - CVC removed per protocol. - Report given to Cherylynn Ridges, RN. - Patient transferred to Med-Surg floor.

## 2020-03-05 NOTE — Progress Notes (Signed)
*  PRELIMINARY RESULTS* Echocardiogram 2D Echocardiogram has been performed. Definity IV Contrast used on this study.  Brad Graham Yerik Zeringue 03/05/2020, 11:43 AM

## 2020-03-05 NOTE — Plan of Care (Signed)

## 2020-03-06 ENCOUNTER — Encounter: Payer: Self-pay | Admitting: Internal Medicine

## 2020-03-06 DIAGNOSIS — N179 Acute kidney failure, unspecified: Secondary | ICD-10-CM

## 2020-03-06 DIAGNOSIS — R739 Hyperglycemia, unspecified: Secondary | ICD-10-CM

## 2020-03-06 LAB — BASIC METABOLIC PANEL
Anion gap: 13 (ref 5–15)
BUN: 19 mg/dL (ref 6–20)
CO2: 27 mmol/L (ref 22–32)
Calcium: 7.4 mg/dL — ABNORMAL LOW (ref 8.9–10.3)
Chloride: 100 mmol/L (ref 98–111)
Creatinine, Ser: 1.36 mg/dL — ABNORMAL HIGH (ref 0.61–1.24)
GFR calc Af Amer: >60 mL/min (ref >60)
GFR calc non Af Amer: 56 mL/min — ABNORMAL LOW (ref >60)
Glucose, Bld: 121 mg/dL — ABNORMAL HIGH (ref 70–99)
Potassium: 3.6 mmol/L (ref 3.5–5.1)
Sodium: 140 mmol/L (ref 135–145)

## 2020-03-06 LAB — GLUCOSE, CAPILLARY
Glucose-Capillary: 106 mg/dL — ABNORMAL HIGH (ref 70–99)
Glucose-Capillary: 108 mg/dL — ABNORMAL HIGH (ref 70–99)
Glucose-Capillary: 114 mg/dL — ABNORMAL HIGH (ref 70–99)
Glucose-Capillary: 116 mg/dL — ABNORMAL HIGH (ref 70–99)
Glucose-Capillary: 131 mg/dL — ABNORMAL HIGH (ref 70–99)
Glucose-Capillary: 91 mg/dL (ref 70–99)

## 2020-03-06 LAB — MAGNESIUM: Magnesium: 1.7 mg/dL (ref 1.7–2.4)

## 2020-03-06 LAB — PHENYTOIN LEVEL, TOTAL: Phenytoin Lvl: 20.4 ug/mL — ABNORMAL HIGH (ref 10.0–20.0)

## 2020-03-06 MED ORDER — POTASSIUM CHLORIDE 10 MEQ/100ML IV SOLN
10.0000 meq | INTRAVENOUS | Status: AC
  Administered 2020-03-06 (×2): 10 meq via INTRAVENOUS
  Filled 2020-03-06: qty 100

## 2020-03-06 MED ORDER — MAGNESIUM SULFATE 2 GM/50ML IV SOLN
2.0000 g | Freq: Once | INTRAVENOUS | Status: AC
  Administered 2020-03-06: 2 g via INTRAVENOUS
  Filled 2020-03-06: qty 50

## 2020-03-06 NOTE — Progress Notes (Signed)
Patient up to the bedside commode with a two person assist and a gait belt. Patient has noticeable weakness in the LLE. Patient follows commands well.

## 2020-03-06 NOTE — Progress Notes (Addendum)
Progress Note    Brad Graham  FOY:[id number] DOB: 1961-01-20  DOA: 03/02/2020 PCP: Carnella Guadalajara I, NP      Brief Narrative:    Medical records reviewed and are as summarized below:  Brad Graham is a 59 y.o. male with a PMH of Syncopal Episode, HTN, HLD, CVA, Recently Discharged from [facility] 01/28/2020 following treatment of unresponsiveness secondary to seizure sctivity suspected due to ETOH withdrawal (pt was not discharged on anticonvulsants and pt refused MRI Brain during hospitalization), Arthritis, and Severe ETOH Abuse.  He presented to [facility] ER from a [place] via EMS on 06/9 with altered mental status.  Per ER notes when EMS arrived at pts residence he was initially awake, but then began having agonal respirations followed by a grand mal seizure.  Initial cardiac rhythm PEA, therefore ACLS protocol initiated with ROSC following 2-3 minutes of CPR.  He was intubated and placed on mechanical ventilation and was admitted to the ICU for further management.  He also had cardiogenic shock and acute kidney injury complicated by hyperkalemia and metabolic acidosis.  He was treated with IV Dilantin, IV fluids and vasopressors.  He was given empiric IV antibiotics for aspiration pneumonia.  Neurologist was consulted to assist with management.      Assessment/Plan:   Principal Problem:   Cardiopulmonary arrest ([place]) Active Problems:   Seizure (HCC)   Hyperkalemia   Metabolic acidosis   Acute encephalopathy   Cardiogenic shock (HCC)   Hyperglycemia   Acute renal failure (HCC)   Goals of care, counseling/discussion   Palliative care by specialist    Acute hypoxemic and hypercapnic respiratory failure, s/p PEA cardiac arrest, aspiration pneumonia: S/p extubation on 03/04/2020  Seizure disorder Continue IV Dilantin.  Follow-up with neurologist.  Aspiration pneumonia: Continue empiric IV antibiotics.  Speech therapist recommended n.p.o. status except  medications.  AKI complicated by metabolic acidosis and hyperkalemia: Hyperkalemia and acidosis have resolved.  Creatinine is improving.  Acute metabolic encephalopathy: Improving  Type 2 diabetes mellitus: NovoLog as needed for hyperglycemia  Thrombocytopenia: Platelet count has been trending down.  Continue to monitor.  Borderline low magnesium and potassium: Replete and monitor levels.  Probable septic versus cardiogenic shock: Resolved  Alcohol use disorder: Ativan as needed for withdrawal syndrome  Body mass index is 35.73 kg/m.  (Morbid obesity)   Family Communication/Anticipated D/C date and plan/Code Status   DVT prophylaxis: SCD Code Status: DNR Family Communication: Plan discussed with patient Disposition Plan:    Status is: Inpatient  Remains inpatient appropriate because:Unsafe d/c plan, IV treatments appropriate due to intensity of illness or inability to take PO and Inpatient level of care appropriate due to severity of illness   Dispo: The patient is from: Group home              Anticipated d/c is to: Group home              Anticipated d/c date is: 2 days              Patient currently is not medically stable to d/c.           Subjective:   No complaints.  Objective:    Vitals:   03/05/20 1952 03/06/20 0023 03/06/20 0447 03/06/20 0748  BP: (!) 157/87 (!) 139/95 (!) 146/78 (!) 149/93  Pulse: 97 (!) 104 96 93  Resp: (!) 24 20 (!) 24 20  Temp: 99.9 F (37.7 C) 98.8 F (37.1 C) 98 F (  36.7 C) 99.2 F (37.3 C)  TempSrc: Oral Oral  Oral  SpO2: 95% 93% 91% 94%  Weight:      Height:       No data found.   Intake/Output Summary (Last 24 hours) at 03/06/2020 1053 Last data filed at 03/06/2020 0455 Gross per 24 hour  Intake 302.57 ml  Output 1350 ml  Net -1047.43 ml   Filed Weights   03/03/20 0500 03/04/20 0613 03/05/20 0500  Weight: 117.8 kg 121.6 kg 119.5 kg    Exam:  GEN: NAD SKIN: No rash EYES: EOMI ENT: MMM CV:  RRR PULM: CTA B ABD: soft, ND, NT, +BS CNS: AAO x 2 (person and place), confused, non focal EXT: No edema or tenderness   Data Reviewed:   I have personally reviewed following labs and imaging studies:  Labs: Labs show the following:   Basic Metabolic Panel: Recent Labs  Lab 03/02/20 2311 03/02/20 2311 03/03/20 0547 03/03/20 0547 03/03/20 1353 03/03/20 1353 03/04/20 0438 03/04/20 0438 03/05/20 0418 03/06/20 0417  NA 139   < > 140  --  139  --  140  --  145 140  K 6.9*   < > 4.7   < > 4.1   < > 3.7   < > 3.4* 3.6  CL 106   < > 100  --  98  --  98  --  101 100  CO2 15*   < > 15*  --  18*  --  30  --  32 27  GLUCOSE 225*   < > 250*  --  387*  --  322*  --  115* 121*  BUN 57*   < > 57*  --  54*  --  44*  --  29* 19  CREATININE 3.62*   < > 3.57*  --  3.35*  --  2.21*  --  1.50* 1.36*  CALCIUM 7.2*   < > 7.0*  --  6.5*  --  6.3*  --  7.2* 7.4*  MG 1.8  --  2.4  --   --   --  1.6*  --   --  1.7  PHOS 9.4*  --  7.1*  --   --   --  3.3  --   --   --    < > = values in this interval not displayed.   GFR Estimated Creatinine Clearance: 77.1 mL/min (A) (by C-G formula based on SCr of 1.36 mg/dL (H)). Liver Function Tests: Recent Labs  Lab 03/02/20 2001 03/03/20 0547 03/04/20 0438  AST 38 33 18  ALT 26 24 18   ALKPHOS 79 75 56  BILITOT 1.7* 1.8* 0.6  PROT 7.1 6.4* 5.6*  ALBUMIN 3.6 3.2* 2.6*   No results for input(s): LIPASE, AMYLASE in the last 168 hours. No results for input(s): AMMONIA in the last 168 hours. Coagulation profile No results for input(s): INR, PROTIME in the last 168 hours.  CBC: Recent Labs  Lab 03/02/20 2001 03/03/20 0547 03/04/20 0438 03/05/20 0418  WBC 8.0 11.4* 6.0 7.4  NEUTROABS 5.0  --   --   --   HGB 9.9* 10.1* 9.4* 9.3*  HCT 32.6* 31.1* 26.6* 28.8*  MCV 98.2 90.9 83.9 90.3  PLT 171 143* 123* 103*   Cardiac Enzymes: Recent Labs  Lab 03/03/20 0547  CKTOTAL 119   BNP (last 3 results) No results for input(s): PROBNP in the last  8760 hours. CBG: Recent Labs  Lab 03/05/20 1610  03/05/20 1950 03/06/20 0023 03/06/20 0446 03/06/20 0747  GLUCAP 120* 123* 106* 114* 108*   D-Dimer: No results for input(s): DDIMER in the last 72 hours. Hgb A1c: No results for input(s): HGBA1C in the last 72 hours. Lipid Profile: No results for input(s): CHOL, HDL, LDLCALC, TRIG, CHOLHDL, LDLDIRECT in the last 72 hours. Thyroid function studies: No results for input(s): TSH, T4TOTAL, T3FREE, THYROIDAB in the last 72 hours.  Invalid input(s): FREET3 Anemia work up: No results for input(s): VITAMINB12, FOLATE, FERRITIN, TIBC, IRON, RETICCTPCT in the last 72 hours. Sepsis Labs: Recent Labs  Lab 03/02/20 2001 03/02/20 2311 03/03/20 0500 03/03/20 0547 03/04/20 0438 03/05/20 0418  PROCALCITON  --  0.55 0.54 0.82  --   --   WBC 8.0  --   --  11.4* 6.0 7.4  LATICACIDVEN 4.5* 1.4  --   --   --   --     Microbiology Recent Results (from the past 240 hour(s))  Blood Culture (routine x 2)     Status: Abnormal (Preliminary result)   Collection Time: 03/02/20  8:01 PM   Specimen: BLOOD  Result Value Ref Range Status   Specimen Description   Final    BLOOD LEFT ANTECUBITAL Performed at [facility], [address] [place], [name] [address]    Special Requests   Final    BOTTLES DRAWN AEROBIC AND ANAEROBIC Blood Culture adequate volume Performed at [facility], [address] [place], [place] [address]    Culture  Setup Time   Final    GRAM POSITIVE COCCI ANAEROBIC BOTTLE ONLY CRITICAL RESULT CALLED TO, READ BACK BY AND VERIFIED WITH: WALID NAZARI AT 1960 03/03/20 BY ACR.PMF AEROBIC BOTTLE ONLY GRAM POSITIVE RODS CRITICAL RESULT CALLED TO, READ BACK BY AND VERIFIED WITH: SCOTT HALL ON 03/04/20 AT 22[address] [facility] Performed at [facility], [address] [place], [place] [address]    Culture (A)  Final    STAPHYLOCOCCUS SPECIES (COAGULASE NEGATIVE) THE SIGNIFICANCE OF ISOLATING THIS ORGANISM FROM  A SINGLE SET OF BLOOD CULTURES WHEN MULTIPLE SETS ARE DRAWN IS UNCERTAIN. PLEASE NOTIFY THE MICROBIOLOGY DEPARTMENT WITHIN ONE WEEK IF SPECIATION AND SENSITIVITIES ARE REQUIRED. CULTURE REINCUBATED FOR BETTER GROWTH Performed at [facility], [place] [address] [place], [place] [address]    Report Status PENDING  Incomplete  Blood Culture ID Panel (Reflexed)     Status: Abnormal   Collection Time: 03/02/20  8:01 PM  Result Value Ref Range Status   Enterococcus species NOT DETECTED NOT DETECTED Final   Listeria monocytogenes NOT DETECTED NOT DETECTED Final   Staphylococcus species DETECTED (A) NOT DETECTED Final    Comment: Methicillin (oxacillin) susceptible coagulase negative staphylococcus. Possible blood culture contaminant (unless isolated from more than one blood culture draw or clinical case suggests pathogenicity). No antibiotic treatment is indicated for blood  culture contaminants. CRITICAL RESULT CALLED TO, READ BACK BY AND VERIFIED WITH: Rito Ehrlich 03/03/2020 @1958  BY ACR    Staphylococcus aureus (BCID) NOT DETECTED NOT DETECTED Final   Methicillin resistance NOT DETECTED NOT DETECTED Final   Streptococcus species NOT DETECTED NOT DETECTED Final   Streptococcus agalactiae NOT DETECTED NOT DETECTED Final   Streptococcus pneumoniae NOT DETECTED NOT DETECTED Final   Streptococcus pyogenes NOT DETECTED NOT DETECTED Final   Acinetobacter baumannii NOT DETECTED NOT DETECTED Final   Enterobacteriaceae species NOT DETECTED NOT DETECTED Final   Enterobacter cloacae complex NOT DETECTED NOT DETECTED Final   Escherichia coli NOT DETECTED NOT DETECTED  Final   Klebsiella oxytoca NOT DETECTED NOT DETECTED Final   Klebsiella pneumoniae NOT DETECTED NOT DETECTED Final   Proteus species NOT DETECTED NOT DETECTED Final   Serratia marcescens NOT DETECTED NOT DETECTED Final   Haemophilus influenzae NOT DETECTED NOT DETECTED Final   Neisseria meningitidis NOT DETECTED NOT DETECTED Final    Pseudomonas aeruginosa NOT DETECTED NOT DETECTED Final   Candida albicans NOT DETECTED NOT DETECTED Final   Candida glabrata NOT DETECTED NOT DETECTED Final   Candida krusei NOT DETECTED NOT DETECTED Final   Candida parapsilosis NOT DETECTED NOT DETECTED Final   Candida tropicalis NOT DETECTED NOT DETECTED Final    Comment: Performed at [facility], [address] [place], [place] [address]  Blood Culture (routine x 2)     Status: None (Preliminary result)   Collection Time: 03/02/20  8:06 PM   Specimen: BLOOD  Result Value Ref Range Status   Specimen Description BLOOD  Final   Special Requests BOTTLES DRAWN AEROBIC AND ANAEROBIC  Final   Culture   Final    NO GROWTH 4 DAYS Performed at [facility], [address] [place], [place] [address]    Report Status PENDING  Incomplete  SARS Coronavirus 2 by RT PCR (hospital order, performed in [place] hospital lab) Nasopharyngeal Nasopharyngeal Swab     Status: None   Collection Time: 03/02/20  8:55 PM   Specimen: Nasopharyngeal Swab  Result Value Ref Range Status   SARS Coronavirus 2 NEGATIVE NEGATIVE Final    Comment: (NOTE) SARS-CoV-2 target nucleic acids are NOT DETECTED. The SARS-CoV-2 RNA is generally detectable in upper and lower respiratory specimens during the acute phase of infection. The lowest concentration of SARS-CoV-2 viral copies this assay can detect is 250 copies / mL. A negative result does not preclude SARS-CoV-2 infection and should not be used as the sole basis for treatment or other patient management decisions.  A negative result may occur with improper specimen collection / handling, submission of specimen other than nasopharyngeal swab, presence of viral mutation(s) within the areas targeted by this assay, and inadequate number of viral copies (<250 copies / mL). A negative result must be combined with clinical observations, patient history, and epidemiological information. Fact Sheet  for Patients:   StrictlyIdeas.no Fact Sheet for Healthcare Providers: BankingDealers.co.za This test is not yet approved or cleared  by the [place] FDA and has been authorized for detection and/or diagnosis of SARS-CoV-2 by FDA under an Emergency Use Authorization (EUA).  This EUA will remain in effect (meaning this test can be used) for the duration of the COVID-19 declaration under Section 564(b)(1) of the Act, 21 U.S.C. section 360bbb-3(b)(1), unless the authorization is terminated or revoked sooner. Performed at [facility], [address] [place], [place] [address]   MRSA PCR Screening     Status: None   Collection Time: 03/02/20 10:30 PM   Specimen: Nasopharyngeal  Result Value Ref Range Status   MRSA by PCR NEGATIVE NEGATIVE Final    Comment:        The GeneXpert MRSA Assay (FDA approved for NASAL specimens only), is one component of a comprehensive MRSA colonization surveillance program. It is not intended to diagnose MRSA infection nor to guide or monitor treatment for MRSA infections. Performed at [facility], [address] [place], [place] [address]   Urine culture     Status: None   Collection Time: 03/03/20  4:40 AM   Specimen: In/Out Cath Urine  Result Value Ref Range Status   Specimen Description   Final    IN/OUT CATH URINE Performed at [facility], [address] [place], [place] [address]    Special Requests   Final    NONE Performed at [facility], [address] [place], [place] [address]    Culture   Final    NO GROWTH Performed at [facility], [place] [address], [place], [place] [address]    Report Status 03/04/2020 FINAL  Final    Procedures and diagnostic studies:  ECHOCARDIOGRAM COMPLETE  Result Date: 03/05/2020    ECHOCARDIOGRAM REPORT   Patient Name:   Brad Graham Date of Exam: 03/05/2020 Medical Rec #:  [id number]        Height:        72.0 in Accession #:    [id number]       Weight:       263.4 lb Date of Birth:  10-21-1960         BSA:          2.395 m Patient Age:    23 years         BP:           127/86 mmHg Patient Gender: M                HR:           92 bpm. Exam Location:  [facility] Procedure: 2D Echo and Intracardiac Opacification Agent Indications:     Bacteremia R78.81  History:         Patient has prior history of Echocardiogram examinations, most                  recent 01/28/2020.  Sonographer:     Arville Go RDCS Referring Phys:  [id number] Bradly Bienenstock Diagnosing Phys: Kate Sable MD  Sonographer Comments: Technically difficult study due to poor echo windows. Image acquisition challenging due to patient body habitus. IMPRESSIONS  1. Left ventricular ejection fraction, by estimation, is 55 to 60%. The left ventricle has normal function. The left ventricle has no regional wall motion abnormalities. Left ventricular diastolic parameters are consistent with Grade I diastolic dysfunction (impaired relaxation).  2. Right ventricular systolic function is normal. The right ventricular size is normal.  3. The mitral valve is normal in structure. No evidence of mitral valve regurgitation. No evidence of mitral stenosis.  4. The aortic valve is normal in structure. Aortic valve regurgitation is not visualized. No aortic stenosis is present.  5. The inferior vena cava is normal in size with greater than 50% respiratory variability, suggesting right atrial pressure of 3 mmHg. Conclusion(s)/Recommendation(s): No evidence of valvular vegetations on this transthoracic echocardiogram. Would recommend a transesophageal echocardiogram to exclude infective endocarditis if clinically indicated. FINDINGS  Left Ventricle: Left ventricular ejection fraction, by estimation, is 55 to 60%. The left ventricle has normal function. The left ventricle has no regional wall motion abnormalities. Definity contrast agent was given IV to delineate the left  ventricular  endocardial borders. The left ventricular internal cavity size was normal in size. There is no left ventricular hypertrophy. Left ventricular diastolic parameters are consistent with Grade I diastolic dysfunction (impaired relaxation). Right Ventricle: The right ventricular size is normal. No increase in right ventricular wall thickness. Right ventricular systolic function is normal. Left Atrium: Left atrial size was normal in size. Right Atrium: Right atrial size was normal in size. Pericardium: There is no evidence of  pericardial effusion. Mitral Valve: The mitral valve is normal in structure. Normal mobility of the mitral valve leaflets. No evidence of mitral valve regurgitation. No evidence of mitral valve stenosis. Tricuspid Valve: The tricuspid valve is normal in structure. Tricuspid valve regurgitation is mild . No evidence of tricuspid stenosis. Aortic Valve: The aortic valve is normal in structure. Aortic valve regurgitation is not visualized. No aortic stenosis is present. Aortic valve peak gradient measures 4.6 mmHg. Pulmonic Valve: The pulmonic valve was not well visualized. Pulmonic valve regurgitation is not visualized. No evidence of pulmonic stenosis. Aorta: The aortic root is normal in size and structure. Venous: The inferior vena cava is normal in size with greater than 50% respiratory variability, suggesting right atrial pressure of 3 mmHg. IAS/Shunts: No atrial level shunt detected by color flow Doppler.  LEFT VENTRICLE PLAX 2D LVIDd:         3.83 cm  Diastology LVIDs:         2.65 cm  LV e' lateral:   7.94 cm/s LV PW:         1.31 cm  LV E/e' lateral: 6.8 LV IVS:        1.32 cm  LV e' medial:    7.40 cm/s LVOT diam:     2.50 cm  LV E/e' medial:  7.3 LV SV:         86 LV SV Index:   36 LVOT Area:     4.91 cm  RIGHT VENTRICLE RV S prime:     17.30 cm/s LEFT ATRIUM             Index LA diam:        3.70 cm 1.54 cm/m LA Vol (A2C):   35.7 ml 14.90 ml/m LA Vol (A4C):   30.6 ml 12.78  ml/m LA Biplane Vol: 35.3 ml 14.74 ml/m  AORTIC VALVE                PULMONIC VALVE AV Area (Vmax): 3.86 cm    PV Vmax:       0.81 m/s AV Vmax:        107.00 cm/s PV Peak grad:  2.7 mmHg AV Peak Grad:   4.6 mmHg LVOT Vmax:      84.20 cm/s LVOT Vmean:     56.800 cm/s LVOT VTI:       0.176 m  AORTA Ao Root diam: 3.30 cm Ao Asc diam:  3.50 cm MITRAL VALVE               TRICUSPID VALVE MV Area (PHT): 4.06 cm    TV Peak grad:   25.4 mmHg MV Decel Time: 187 msec    TV Vmax:        2.52 m/s MV E velocity: 53.80 cm/s MV A velocity: 74.60 cm/s  SHUNTS MV E/A ratio:  0.72        Systemic VTI:  0.18 m                            Systemic Diam: 2.50 cm Kate Sable MD Electronically signed by Kate Sable MD Signature Date/Time: 03/05/2020/1:47:51 PM    Final     Medications:   . chlorhexidine gluconate (MEDLINE KIT)  15 mL Mouth Rinse BID  . Chlorhexidine Gluconate Cloth  6 each Topical Daily  . insulin aspart  0-20 Units Subcutaneous Q4H  . sodium chloride flush  10-40 mL Intracatheter Q12H   Continuous Infusions: .  sodium chloride 250 mL (03/06/20 0030)  .  ceFAZolin (ANCEF) IV 2 g (03/06/20 0031)  . famotidine (PEPCID) IV 20 mg (03/06/20 1020)  . magnesium sulfate bolus IVPB    . phenytoin (DILANTIN) IV 250 mg (03/06/20 0132)  . potassium chloride       LOS: 4 days   Brad Graham  [facility]     03/06/2020, 10:52 AM

## 2020-03-06 NOTE — Progress Notes (Signed)
Per the department ADON, patient is allowed to have his uncle Marque Rademaker (legal guardian) and his two sisters, Johnnye Sima and Myrene Buddy, as visitors during this admission. The names of these visitors has also been documented under the Daily Cares flowsheet.

## 2020-03-06 NOTE — Progress Notes (Signed)
   03/06/20 1825  Pre-Screen Questions- If "YES" to any of the following questions, STOP the screen, keep NPO, and place order for SLP eval and treat   Home diet required thickened liquids No  Trach tube present No  Radiation to Head/Neck No  Patient Readiness for Screen - If "YES" to any of the following questions, WAIT to screen, keep NPO  Is patient lethargic or unable to stay alert/awake? No  HOB restricted to <30 degrees No  NPO for planned procedure No  Brief Cognitive Screen- Aspiration Risk Assessment  Is patient oriented to name, place, or year? Yes  Able to open mouth, stick out tongue, or smile Yes  Oral Mechanism Exam  Able to seal lips Yes  Able to move tongue from side to side Yes  Face is symmetric Yes  Mandatory Oral Care Performed   Mandatory oral care performed Yes  3 oz Water Swallow Challenge  Does the patient stop drinking? No  Does the patient cough/choke? No  Screening Result Passed

## 2020-03-06 NOTE — Progress Notes (Addendum)
Brad Graham for Phenytoin  Indication: Seizure [facility])   Patient Measurements: Height: 6' (182.9 cm) Weight: 119.5 kg (263 lb 7.2 oz) IBW/kg (Calculated) : 77.6  Vital Signs: Temp: 98 F (36.7 C) (06/13 0447) Temp Source: Oral (06/13 0023) BP: 146/78 (06/13 0447) Pulse Rate: 96 (06/13 0447) Intake/Output from previous day: 06/12 0701 - 06/13 0700 In: 378.1 [I.V.:23; IV Piggyback:355.1] Out: 1650 [Urine:1650] Intake/Output from this shift: No intake/output data recorded.  Labs: Recent Labs    03/03/20 1353 03/04/20 0438 03/05/20 0418  WBC  --  6.0 7.4  HGB  --  9.4* 9.3*  HCT  --  26.6* 28.8*  PLT  --  123* 103*  CREATININE 3.35* 2.21* 1.50*  MG  --  1.6*  --   PHOS  --  3.3  --   ALBUMIN  --  2.6*  --   PROT  --  5.6*  --   AST  --  18  --   ALT  --  18  --   ALKPHOS  --  56  --   BILITOT  --  0.6  --    Estimated Creatinine Clearance: 69.9 mL/min (A) (by C-G formula based on SCr of 1.5 mg/dL (H)).   Microbiology: Recent Results (from the past 720 hour(s))  Blood Culture (routine x 2)     Status: Abnormal (Preliminary result)   Collection Time: 03/02/20  8:01 PM   Specimen: BLOOD  Result Value Ref Range Status   Specimen Description   Final    BLOOD LEFT ANTECUBITAL Performed at [facility], [address], [place], [place] [address]    Special Requests   Final    BOTTLES DRAWN AEROBIC AND ANAEROBIC Blood Culture adequate volume Performed at [facility], [address] [place], [place] [address]    Culture  Setup Time   Final    GRAM POSITIVE COCCI ANAEROBIC BOTTLE ONLY CRITICAL RESULT CALLED TO, READ BACK BY AND VERIFIED WITH: WALID NAZARI AT 1958 03/03/20 BY ACR.PMF AEROBIC BOTTLE ONLY GRAM POSITIVE RODS CRITICAL RESULT CALLED TO, READ BACK BY AND VERIFIED WITH: SCOTT HALL ON 03/04/20 AT [address] [facility] Performed at [facility], [address] [place], [place] [address]     Culture (A)  Final    STAPHYLOCOCCUS SPECIES (COAGULASE NEGATIVE) THE SIGNIFICANCE OF ISOLATING THIS ORGANISM FROM A SINGLE SET OF BLOOD CULTURES WHEN MULTIPLE SETS ARE DRAWN IS UNCERTAIN. PLEASE NOTIFY THE MICROBIOLOGY DEPARTMENT WITHIN ONE WEEK IF SPECIATION AND SENSITIVITIES ARE REQUIRED. CULTURE REINCUBATED FOR BETTER GROWTH Performed at [facility], [place] [address], [place], [place] [address]    Report Status PENDING  Incomplete  Blood Culture ID Panel (Reflexed)     Status: Abnormal   Collection Time: 03/02/20  8:01 PM  Result Value Ref Range Status   Enterococcus species NOT DETECTED NOT DETECTED Final   Listeria monocytogenes NOT DETECTED NOT DETECTED Final   Staphylococcus species DETECTED (A) NOT DETECTED Final    Comment: Methicillin (oxacillin) susceptible coagulase negative staphylococcus. Possible blood culture contaminant (unless isolated from more than one blood culture draw or clinical case suggests pathogenicity). No antibiotic treatment is indicated for blood  culture contaminants. CRITICAL RESULT CALLED TO, READ BACK BY AND VERIFIED WITH: WALID NAZARI 03/03/2020 @1958  BY ACR    Staphylococcus aureus (BCID) NOT DETECTED NOT DETECTED Final   Methicillin resistance NOT DETECTED NOT DETECTED Final   Streptococcus species NOT DETECTED NOT DETECTED Final  Streptococcus agalactiae NOT DETECTED NOT DETECTED Final   Streptococcus pneumoniae NOT DETECTED NOT DETECTED Final   Streptococcus pyogenes NOT DETECTED NOT DETECTED Final   Acinetobacter baumannii NOT DETECTED NOT DETECTED Final   Enterobacteriaceae species NOT DETECTED NOT DETECTED Final   Enterobacter cloacae complex NOT DETECTED NOT DETECTED Final   Escherichia coli NOT DETECTED NOT DETECTED Final   Klebsiella oxytoca NOT DETECTED NOT DETECTED Final   Klebsiella pneumoniae NOT DETECTED NOT DETECTED Final   Proteus species NOT DETECTED NOT DETECTED Final   Serratia marcescens NOT DETECTED NOT DETECTED Final    Haemophilus influenzae NOT DETECTED NOT DETECTED Final   Neisseria meningitidis NOT DETECTED NOT DETECTED Final   Pseudomonas aeruginosa NOT DETECTED NOT DETECTED Final   Candida albicans NOT DETECTED NOT DETECTED Final   Candida glabrata NOT DETECTED NOT DETECTED Final   Candida krusei NOT DETECTED NOT DETECTED Final   Candida parapsilosis NOT DETECTED NOT DETECTED Final   Candida tropicalis NOT DETECTED NOT DETECTED Final    Comment: Performed at [facility], [address] [place], [place] [address]  Blood Culture (routine x 2)     Status: None (Preliminary result)   Collection Time: 03/02/20  8:06 PM   Specimen: BLOOD  Result Value Ref Range Status   Specimen Description BLOOD  Final   Special Requests BOTTLES DRAWN AEROBIC AND ANAEROBIC  Final   Culture   Final    NO GROWTH 4 DAYS Performed at [facility], [address], [place], [place] [address]    Report Status PENDING  Incomplete  SARS Coronavirus 2 by RT PCR (hospital order, performed in [place] hospital lab) Nasopharyngeal Nasopharyngeal Swab     Status: None   Collection Time: 03/02/20  8:55 PM   Specimen: Nasopharyngeal Swab  Result Value Ref Range Status   SARS Coronavirus 2 NEGATIVE NEGATIVE Final    Comment: (NOTE) SARS-CoV-2 target nucleic acids are NOT DETECTED. The SARS-CoV-2 RNA is generally detectable in upper and lower respiratory specimens during the acute phase of infection. The lowest concentration of SARS-CoV-2 viral copies this assay can detect is 250 copies / mL. A negative result does not preclude SARS-CoV-2 infection and should not be used as the sole basis for treatment or other patient management decisions.  A negative result may occur with improper specimen collection / handling, submission of specimen other than nasopharyngeal swab, presence of viral mutation(s) within the areas targeted by this assay, and inadequate number of viral copies (<250 copies / mL). A negative  result must be combined with clinical observations, patient history, and epidemiological information. Fact Sheet for Patients:   StrictlyIdeas.no Fact Sheet for Healthcare Providers: BankingDealers.co.za This test is not yet approved or cleared  by the [place] FDA and has been authorized for detection and/or diagnosis of SARS-CoV-2 by FDA under an Emergency Use Authorization (EUA).  This EUA will remain in effect (meaning this test can be used) for the duration of the COVID-19 declaration under Section 564(b)(1) of the Act, 21 U.S.C. section 360bbb-3(b)(1), unless the authorization is terminated or revoked sooner. Performed at [facility], [address] [place],  [address]   MRSA PCR Screening     Status: None   Collection Time: 03/02/20 10:30 PM   Specimen: Nasopharyngeal  Result Value Ref Range Status   MRSA by PCR NEGATIVE NEGATIVE Final    Comment:        The GeneXpert MRSA Assay (FDA approved for NASAL specimens only), is one  component of a comprehensive MRSA colonization surveillance program. It is not intended to diagnose MRSA infection nor to guide or monitor treatment for MRSA infections. Performed at [facility], [address] [place], [place] [address]   Urine culture     Status: None   Collection Time: 03/03/20  4:40 AM   Specimen: In/Out Cath Urine  Result Value Ref Range Status   Specimen Description   Final    IN/OUT CATH URINE Performed at [facility], [address], [place], [place] [address]    Special Requests   Final    NONE Performed at [facility], [address] [place], [place] [address]    Culture   Final    NO GROWTH Performed at [facility], [place] [address] [place], [place] [address]    Report Status 03/04/2020 FINAL  Final    Medical History: Past Medical History:  Diagnosis Date  . Alcohol abuse   . Arthritis   . H/O:  CVA (cerebrovascular accident)   . Hyperlipidemia   . Hypertension   . Syncope 07/02/2016  . Syncope and collapse 01/27/2020    Medications:  Medications Prior to Admission  Medication Sig Dispense Refill Last Dose  . amLODipine (NORVASC) 10 MG tablet Take 1 tablet (10 mg total) by mouth daily. 30 tablet 0 unknown at unknown  . aspirin EC 81 MG tablet Take 1 tablet (81 mg total) by mouth daily. 30 tablet 0 unknown at unknown  . atorvastatin (LIPITOR) 20 MG tablet Take 1 tablet (20 mg total) by mouth daily at 6 PM. 30 tablet 0 unknown at unknown  . blood glucose meter kit and supplies KIT Dispense based on patient and insurance preference. Use up to four times daily as directed. (FOR ICD-9 250.00, 250.01). 1 each 0   . carvedilol (COREG) 25 MG tablet Take 1 tablet (25 mg total) by mouth 2 (two) times daily with a meal. 60 tablet 0 unknown at unknown  . folic acid (FOLVITE) 1 MG tablet Take 1 tablet (1 mg total) by mouth daily. 30 tablet 0 unknown at unknown  . hydrALAZINE (APRESOLINE) 25 MG tablet Take 1 tablet (25 mg total) by mouth every 6 (six) hours. 120 tablet 0 unknown at unknown  . metFORMIN (GLUCOPHAGE) 1000 MG tablet Take 1 tablet (1,000 mg total) by mouth daily with breakfast. 30 tablet 0 unknwon at [place]  . Multiple Vitamin (MULTIVITAMIN WITH MINERALS) TABS tablet Take 1 tablet by mouth daily. 30 tablet 0 unknown at unknown  . tamsulosin (FLOMAX) 0.4 MG CAPS capsule Take 1 capsule (0.4 mg total) by mouth daily after supper. 30 capsule 0 unknown at unknown  . thiamine 100 MG tablet Take 1 tablet (100 mg total) by mouth daily. 30 tablet 0 unknown at unknown    Assessment: Pharmacy consulted to dose phenytoin in this 59 year old male with history of seizures, pt had grand mal seizure on 6/9 , now resolved.  He was not on any anti epileptics PTA. He was started with phenytoin 600 mg IV Q8H X 3 doses (1800 mg loading dose) followed by phenytoin 250 mg IV Q12H. A level was drawn with this  morning's labs:  Phenytoin Level: 20.4 mcg/L Corrected Phenytoin Level: 25.0 mcg/L  The last dose prior to this level was scheduled for 2200 and administered at 0132  Goal of Therapy:  Total phenytoin 10 - 20 mcg/L (trough)  Plan:   Phenytoin dose was given too close in proximity  to drawn level to be considered a trough level  Continue phenytoin 250 mg IV q12h  Re-draw level with 6/14 am labs  Dallie Piles 03/06/2020,7:13 AM

## 2020-03-06 NOTE — Consult Note (Signed)
PHARMACY CONSULT NOTE  Pharmacy Consult for Electrolyte Monitoring and Replacement   Recent Labs: Potassium (mmol/L)  Date Value  03/06/2020 3.6  05/02/2014 3.6   Magnesium (mg/dL)  Date Value  94/32/0037 1.7   Calcium (mg/dL)  Date Value  94/44/6190 7.4 (L)   Calcium, Total (mg/dL)  Date Value  09/14/4113 8.5   Albumin (g/dL)  Date Value  64/31/4276 2.6 (L)  02/16/2020 4.6  05/02/2014 3.8   Phosphorus (mg/dL)  Date Value  70/07/33 3.3   Sodium (mmol/L)  Date Value  03/06/2020 140  02/16/2020 137  05/02/2014 126 (L)   Corrected Ca: 8.5 mg/dL  Assessment: 59 yo male admitted s/p PEA arrest following a grand mal seizure with acute renal failure with hyperkalemia, severe metabolic acidosis, and cardiogenic shock requiring mechanical intubation and vasopressorsnow extubated and transferred to 1C  Goal of Therapy:  Potassium 4.0 - 5.1 mmol/L Magnesium 2.0 - 2.4 mg/dL All Other Electrolytes WNL  Plan:   IV magnesium sulfate 2 grams x 1  IV KCl 10 mEq x 2  F/u electrolytes am 6/14 and replace as needed  Lowella Bandy ,PharmD Clinical Pharmacist 03/06/2020 9:58 AM

## 2020-03-07 DIAGNOSIS — D649 Anemia, unspecified: Secondary | ICD-10-CM

## 2020-03-07 DIAGNOSIS — D696 Thrombocytopenia, unspecified: Secondary | ICD-10-CM

## 2020-03-07 DIAGNOSIS — R7881 Bacteremia: Secondary | ICD-10-CM

## 2020-03-07 DIAGNOSIS — G40909 Epilepsy, unspecified, not intractable, without status epilepticus: Secondary | ICD-10-CM

## 2020-03-07 LAB — RENAL FUNCTION PANEL
Albumin: 2.8 g/dL — ABNORMAL LOW (ref 3.5–5.0)
Anion gap: 12 (ref 5–15)
BUN: 16 mg/dL (ref 6–20)
CO2: 26 mmol/L (ref 22–32)
Calcium: 7.9 mg/dL — ABNORMAL LOW (ref 8.9–10.3)
Chloride: 98 mmol/L (ref 98–111)
Creatinine, Ser: 1.1 mg/dL (ref 0.61–1.24)
GFR calc Af Amer: >60 mL/min (ref >60)
GFR calc non Af Amer: >60 mL/min (ref >60)
Glucose, Bld: 112 mg/dL — ABNORMAL HIGH (ref 70–99)
Phosphorus: 2.1 mg/dL — ABNORMAL LOW (ref 2.5–4.6)
Potassium: 3.9 mmol/L (ref 3.5–5.1)
Sodium: 136 mmol/L (ref 135–145)

## 2020-03-07 LAB — CBC WITH DIFFERENTIAL/PLATELET
Abs Immature Granulocytes: 0.06 10*3/uL (ref 0.00–0.07)
Basophils Absolute: 0.1 10*3/uL (ref 0.0–0.1)
Basophils Relative: 1 %
Eosinophils Absolute: 0.1 10*3/uL (ref 0.0–0.5)
Eosinophils Relative: 1 %
HCT: 31.1 % — ABNORMAL LOW (ref 39.0–52.0)
Hemoglobin: 10 g/dL — ABNORMAL LOW (ref 13.0–17.0)
Immature Granulocytes: 1 %
Lymphocytes Relative: 18 %
Lymphs Abs: 1.3 10*3/uL (ref 0.7–4.0)
MCH: 29.2 pg (ref 26.0–34.0)
MCHC: 32.2 g/dL (ref 30.0–36.0)
MCV: 90.9 fL (ref 80.0–100.0)
Monocytes Absolute: 0.8 10*3/uL (ref 0.1–1.0)
Monocytes Relative: 11 %
Neutro Abs: 4.9 10*3/uL (ref 1.7–7.7)
Neutrophils Relative %: 68 %
Platelets: 125 10*3/uL — ABNORMAL LOW (ref 150–400)
RBC: 3.42 MIL/uL — ABNORMAL LOW (ref 4.22–5.81)
RDW: 14.6 % (ref 11.5–15.5)
WBC: 7.3 10*3/uL (ref 4.0–10.5)
nRBC: 0 % (ref 0.0–0.2)

## 2020-03-07 LAB — GLUCOSE, CAPILLARY
Glucose-Capillary: 112 mg/dL — ABNORMAL HIGH (ref 70–99)
Glucose-Capillary: 119 mg/dL — ABNORMAL HIGH (ref 70–99)
Glucose-Capillary: 102 mg/dL — ABNORMAL HIGH (ref 70–99)
Glucose-Capillary: 131 mg/dL — ABNORMAL HIGH (ref 70–99)
Glucose-Capillary: 155 mg/dL — ABNORMAL HIGH (ref 70–99)
Glucose-Capillary: 109 mg/dL — ABNORMAL HIGH (ref 70–99)

## 2020-03-07 LAB — CULTURE, BLOOD (ROUTINE X 2): Culture: NO GROWTH

## 2020-03-07 LAB — MAGNESIUM: Magnesium: 1.7 mg/dL (ref 1.7–2.4)

## 2020-03-07 LAB — PHENYTOIN LEVEL, TOTAL: Phenytoin Lvl: 21 ug/mL — ABNORMAL HIGH (ref 10.0–20.0)

## 2020-03-07 MED ORDER — LORAZEPAM 2 MG/ML IJ SOLN: 0.0000 mg | Freq: Two times a day (BID) | INTRAMUSCULAR | Status: AC

## 2020-03-07 MED ORDER — SODIUM CHLORIDE 0.9 % IV SOLN
250.0000 mg | Freq: Two times a day (BID) | INTRAVENOUS | Status: AC
  Administered 2020-03-08: 10:00:00 250 mg via INTRAVENOUS
  Filled 2020-03-07 (×2): qty 5

## 2020-03-07 MED ORDER — LORAZEPAM 2 MG/ML IJ SOLN: 1.0000 mg | INTRAMUSCULAR | Status: DC | PRN

## 2020-03-07 MED ORDER — LORAZEPAM 1 MG PO TABS: 1.0000 mg | ORAL_TABLET | ORAL | Status: DC | PRN

## 2020-03-07 MED ORDER — LORAZEPAM 2 MG/ML IJ SOLN: 0.0000 mg | Freq: Four times a day (QID) | INTRAMUSCULAR | Status: AC

## 2020-03-07 MED ORDER — MAGNESIUM SULFATE 2 GM/50ML IV SOLN
2.0000 g | Freq: Once | INTRAVENOUS | Status: AC
  Administered 2020-03-07: 2 g via INTRAVENOUS
  Filled 2020-03-07: qty 50

## 2020-03-07 MED ORDER — SODIUM CHLORIDE 0.9 % IV SOLN
250.0000 mg | Freq: Two times a day (BID) | INTRAVENOUS | Status: DC
  Filled 2020-03-07: qty 5

## 2020-03-07 MED ORDER — DEXTROSE IN LACTATED RINGERS 5 % IV SOLN: INTRAVENOUS | Status: DC

## 2020-03-07 MED ORDER — POTASSIUM PHOSPHATES 15 MMOLE/5ML IV SOLN
10.0000 mmol | Freq: Once | INTRAVENOUS | Status: AC
  Administered 2020-03-07: 15:00:00 10 mmol via INTRAVENOUS
  Filled 2020-03-07: qty 3.33

## 2020-03-07 NOTE — Progress Notes (Signed)
eeg completed ° °

## 2020-03-07 NOTE — Consult Note (Addendum)
PHARMACY CONSULT NOTE  Pharmacy Consult for Electrolyte Monitoring and Replacement   Recent Labs: Potassium (mmol/L)  Date Value  03/07/2020 3.9  05/02/2014 3.6   Magnesium (mg/dL)  Date Value  03/75/4360 1.7   Calcium (mg/dL)  Date Value  67/70/3403 7.9 (L)   Calcium, Total (mg/dL)  Date Value  52/48/1859 8.5   Albumin (g/dL)  Date Value  09/31/1216 2.8 (L)  02/16/2020 4.6  05/02/2014 3.8   Phosphorus (mg/dL)  Date Value  24/46/9507 2.1 (L)   Sodium (mmol/L)  Date Value  03/07/2020 136  02/16/2020 137  05/02/2014 126 (L)   Corrected Ca: 8.7 mg/dL  Assessment: 59 yo male admitted s/p PEA arrest following a grand mal seizure with acute renal failure with hyperkalemia, severe metabolic acidosis, and cardiogenic shock requiring mechanical intubation and vasopressorsnow extubated and transferred to 1C.    Goal of Therapy:  Potassium 4.0 - 5.1 mmol/L Magnesium 2.0 - 2.4 mg/dL All Other Electrolytes WNL  Plan:   KPhos IV x 1  Mg Sulfate 2g IV x 1  F/u electrolytes am 6/15 and replace as needed  Albina Billet ,PharmD Clinical Pharmacist 03/07/2020 9:56 AM

## 2020-03-07 NOTE — Progress Notes (Signed)
Pt off unit via bed to EEG in stable condition.

## 2020-03-07 NOTE — Progress Notes (Signed)
Rehab Admissions Coordinator Note:  Per PT and OT recommendation, this patient was screened by Cheri Rous for appropriateness for an Inpatient Acute Rehab Consult.  At this time, we will follow for more progress with therapy. Per eval notes, pt is lethargic and unable to progress OOB safely at this time. Will need to see evidence that pt can participate meaningfully prior to requesting consult.    Cheri Rous 03/07/2020, 2:12 PM  I can be reached at [phone].

## 2020-03-07 NOTE — Consult Note (Signed)
Reason for Consult: seizure type of activity Requesting Physician: Dr. Myriam Forehand  CC: shaking    HPI: Brad Graham is an 59 y.o. male PMH of Syncopal Episode, HTN, HLD, CVA,Recently Discharged from [facility] 01/28/2020 following treatment of unresponsivenesssecondary to seizuresctivity suspecteddueto ETOH withdrawal (pt was not discharged on anticonvulsants and pt refused MRI Brain during hospitalization), Arthritis, and Severe ETOH Abuse. He presented to [facility] ER from a Group Home via EMS on 06/9 with altered mental status. Per ER notes when EMSarrived at pts residence he was initially awake,but then began having agonal respirations followed by a grand mal seizure. Initial cardiac rhythm PEA, thereforeACLSprotocol initiatedwith ROSC following 2-3 minutes of CPR.  He was intubated and placed on mechanical ventilation and was admitted to the ICU for further management. Patient is s/p extubation. Suspected possible seizure activity today.   Past Medical History:  Diagnosis Date  . Alcohol abuse   . Arthritis   . H/O: CVA (cerebrovascular accident)   . Hyperlipidemia   . Hypertension   . Syncope 07/02/2016  . Syncope and collapse 01/27/2020    Past Surgical History:  Procedure Laterality Date  . none      Family History  Problem Relation Age of Onset  . Diabetes Mother   . Stroke Mother   . Heart disease Maternal Grandmother   . Heart disease Maternal Grandfather     Social History:  reports that he has been smoking cigarettes. He has been smoking about 0.50 packs per day. He has never used smokeless tobacco. He reports current alcohol use. He reports that he does not use drugs.  No Known Allergies  Medications: I have reviewed the patient's current medications.  ROS: Unable to obtain as somnolent   Physical Examination: Blood pressure (!) 171/80, pulse 83, temperature 98.1 F (36.7 C), temperature source Oral, resp. rate 20, height 6' (1.829 m), weight 119.5 kg, SpO2 96  %.   Neurological Examination   Mental Status: Somnolent, not following commands Cranial Nerves: II: Discs flat bilaterally; Visual fields grossly normal, pupils equal, round, reactive to light and accommodation Motor: withdraw from pain    Laboratory Studies:   Basic Metabolic Panel: Recent Labs  Lab 03/02/20 2311 03/02/20 2311 03/03/20 0547 03/03/20 0547 03/03/20 1353 03/03/20 1353 03/04/20 0438 03/04/20 0438 03/05/20 0418 03/06/20 0417 03/07/20 0517 03/07/20 0924  NA 139   < > 140   < > 139  --  140  --  145 140 136  --   K 6.9*   < > 4.7   < > 4.1  --  3.7  --  3.4* 3.6 3.9  --   CL 106   < > 100   < > 98  --  98  --  101 100 98  --   CO2 15*   < > 15*   < > 18*  --  30  --  32 27 26  --   GLUCOSE 225*   < > 250*   < > 387*  --  322*  --  115* 121* 112*  --   BUN 57*   < > 57*   < > 54*  --  44*  --  29* 19 16  --   CREATININE 3.62*   < > 3.57*   < > 3.35*  --  2.21*  --  1.50* 1.36* 1.10  --   CALCIUM 7.2*   < > 7.0*   < > 6.5*   < > 6.3*   < >  7.2* 7.4* 7.9*  --   MG 1.8  --  2.4  --   --   --  1.6*  --   --  1.7  --  1.7  PHOS 9.4*  --  7.1*  --   --   --  3.3  --   --   --  2.1*  --    < > = values in this interval not displayed.    Liver Function Tests: Recent Labs  Lab 03/02/20 2001 03/03/20 0547 03/04/20 0438 03/07/20 0517  AST 38 33 18  --   ALT --   ALKPHOS 79 75 56  --   BILITOT 1.7* 1.8* 0.6  --   PROT 7.1 6.4* 5.6*  --   ALBUMIN 3.6 3.2* 2.6* 2.8*   No results for input(s): LIPASE, AMYLASE in the last 168 hours. No results for input(s): AMMONIA in the last 168 hours.  CBC: Recent Labs  Lab 03/02/20 2001 03/03/20 0547 03/04/20 0438 03/05/20 0418 03/07/20 0517  WBC 8.0 11.4* 6.0 7.4 7.3  NEUTROABS 5.0  --   --   --  4.9  HGB 9.9* 10.1* 9.4* 9.3* 10.0*  HCT 32.6* 31.1* 26.6* 28.8* 31.1*  MCV 98.2 90.9 83.9 90.3 90.9  PLT 171 143* 123* 103* 125*    Cardiac Enzymes: Recent Labs  Lab 03/03/20 0547  CKTOTAL 119     BNP: Invalid input(s): POCBNP  CBG: Recent Labs  Lab 03/06/20 1626 03/06/20 2100 03/07/20 0055 03/07/20 0330 03/07/20 0800  GLUCAP 91 116* 112* 119* 102*    Microbiology: Results for orders placed or performed during the hospital encounter of 03/02/20  Blood Culture (routine x 2)     Status: Abnormal (Preliminary result)   Collection Time: 03/02/20  8:01 PM   Specimen: BLOOD  Result Value Ref Range Status   Specimen Description   Final    BLOOD LEFT ANTECUBITAL Performed at [facility], [address] [place], [place] [address]    Special Requests   Final    BOTTLES DRAWN AEROBIC AND ANAEROBIC Blood Culture adequate volume Performed at [facility], [address] [place], [place] [address]    Culture  Setup Time   Final    GRAM POSITIVE COCCI ANAEROBIC BOTTLE ONLY CRITICAL RESULT CALLED TO, READ BACK BY AND VERIFIED WITH: WALID NAZARI AT 1958 03/03/20 BY ACR.PMF AEROBIC BOTTLE ONLY GRAM POSITIVE RODS CRITICAL RESULT CALLED TO, READ BACK BY AND VERIFIED WITH: SCOTT HALL ON 03/04/20 AT [address] [place] Performed at [facility], [address] [place], [place] [address]    Culture (A)  Final    STAPHYLOCOCCUS SPECIES (COAGULASE NEGATIVE) THE SIGNIFICANCE OF ISOLATING THIS ORGANISM FROM A SINGLE SET OF BLOOD CULTURES WHEN MULTIPLE SETS ARE DRAWN IS UNCERTAIN. PLEASE NOTIFY THE MICROBIOLOGY DEPARTMENT WITHIN ONE WEEK IF SPECIATION AND SENSITIVITIES ARE REQUIRED. CULTURE REINCUBATED FOR BETTER GROWTH Performed at [facility], [address] [place], [place] [address]    Report Status PENDING  Incomplete  Blood Culture ID Panel (Reflexed)     Status: Abnormal   Collection Time: 03/02/20  8:01 PM  Result Value Ref Range Status   Enterococcus species NOT DETECTED NOT DETECTED Final   Listeria monocytogenes NOT DETECTED NOT DETECTED Final   Staphylococcus species DETECTED (A) NOT DETECTED Final    Comment: Methicillin (oxacillin)  susceptible coagulase negative staphylococcus. Possible blood culture contaminant (unless isolated from more than one blood culture draw or clinical  case suggests pathogenicity). No antibiotic treatment is indicated for blood  culture contaminants. CRITICAL RESULT CALLED TO, READ BACK BY AND VERIFIED WITH: Mila Merry 03/03/2020 @1958  BY ACR    Staphylococcus aureus (BCID) NOT DETECTED NOT DETECTED Final   Methicillin resistance NOT DETECTED NOT DETECTED Final   Streptococcus species NOT DETECTED NOT DETECTED Final   Streptococcus agalactiae NOT DETECTED NOT DETECTED Final   Streptococcus pneumoniae NOT DETECTED NOT DETECTED Final   Streptococcus pyogenes NOT DETECTED NOT DETECTED Final   Acinetobacter baumannii NOT DETECTED NOT DETECTED Final   Enterobacteriaceae species NOT DETECTED NOT DETECTED Final   Enterobacter cloacae complex NOT DETECTED NOT DETECTED Final   Escherichia coli NOT DETECTED NOT DETECTED Final   Klebsiella oxytoca NOT DETECTED NOT DETECTED Final   Klebsiella pneumoniae NOT DETECTED NOT DETECTED Final   Proteus species NOT DETECTED NOT DETECTED Final   Serratia marcescens NOT DETECTED NOT DETECTED Final   Haemophilus influenzae NOT DETECTED NOT DETECTED Final   Neisseria meningitidis NOT DETECTED NOT DETECTED Final   Pseudomonas aeruginosa NOT DETECTED NOT DETECTED Final   Candida albicans NOT DETECTED NOT DETECTED Final   Candida glabrata NOT DETECTED NOT DETECTED Final   Candida krusei NOT DETECTED NOT DETECTED Final   Candida parapsilosis NOT DETECTED NOT DETECTED Final   Candida tropicalis NOT DETECTED NOT DETECTED Final    Comment: Performed at [facility], [address] [place], [place]  Blood Culture (routine x 2)     Status: None   Collection Time: 03/02/20  8:06 PM   Specimen: BLOOD  Result Value Ref Range Status   Specimen Description BLOOD  Final   Special Requests BOTTLES DRAWN AEROBIC AND ANAEROBIC  Final   Culture   Final     NO GROWTH 5 DAYS Performed at [facility], [address], [place], [place]    Report Status 03/07/2020 FINAL  Final  SARS Coronavirus 2 by RT PCR (hospital order, performed in [facility] hospital lab) Nasopharyngeal Nasopharyngeal Swab     Status: None   Collection Time: 03/02/20  8:55 PM   Specimen: Nasopharyngeal Swab  Result Value Ref Range Status   SARS Coronavirus 2 NEGATIVE NEGATIVE Final    Comment: (NOTE) SARS-CoV-2 target nucleic acids are NOT DETECTED. The SARS-CoV-2 RNA is generally detectable in upper and lower respiratory specimens during the acute phase of infection. The lowest concentration of SARS-CoV-2 viral copies this assay can detect is 250 copies / mL. A negative result does not preclude SARS-CoV-2 infection and should not be used as the sole basis for treatment or other patient management decisions.  A negative result may occur with improper specimen collection / handling, submission of specimen other than nasopharyngeal swab, presence of viral mutation(s) within the areas targeted by this assay, and inadequate number of viral copies (<250 copies / mL). A negative result must be combined with clinical observations, patient history, and epidemiological information. Fact Sheet for Patients:   05/02/20 Fact Sheet for Healthcare Providers: BoilerBrush.com.cy This test is not yet approved or cleared  by the https://pope.com/ FDA and has been authorized for detection and/or diagnosis of SARS-CoV-2 by FDA under an Emergency Use Authorization (EUA).  This EUA will remain in effect (meaning this test can be used) for the duration of the COVID-19 declaration under Section 564(b)(1) of the Act, 21 U.S.C. section 360bbb-3(b)(1), unless the authorization is terminated or revoked sooner. Performed at [facility], [address] [place], [place]  MRSA PCR Screening     Status:  None   Collection Time: 03/02/20 10:30 PM   Specimen: Nasopharyngeal  Result Value Ref Range Status   MRSA by PCR NEGATIVE NEGATIVE Final    Comment:        The GeneXpert MRSA Assay (FDA approved for NASAL specimens only), is one component of a comprehensive MRSA colonization surveillance program. It is not intended to diagnose MRSA infection nor to guide or monitor treatment for MRSA infections. Performed at [facility], [address], [place], [place] [address]   Urine culture     Status: None   Collection Time: 03/03/20  4:40 AM   Specimen: In/Out Cath Urine  Result Value Ref Range Status   Specimen Description   Final    IN/OUT CATH URINE Performed at [facility], [address] [place], [place] [address]    Special Requests   Final    NONE Performed at [facility], [address] [place], [place] [address]    Culture   Final    NO GROWTH Performed at [facility], [address], [place], [place] [address]    Report Status 03/04/2020 FINAL  Final    Coagulation Studies: No results for input(s): LABPROT, INR in the last 72 hours.  Urinalysis:  Recent Labs  Lab 03/03/20 0440  COLORURINE AMBER*  LABSPEC 1.021  PHURINE 5.0  GLUCOSEU 50*  HGBUR MODERATE*  BILIRUBINUR NEGATIVE  KETONESUR 5*  PROTEINUR 100*  NITRITE NEGATIVE  LEUKOCYTESUR TRACE*    Lipid Panel:     Component Value Date/Time   CHOL 222 (H) 02/02/2020 1604   CHOL CANCELED 08/15/2015 1340   TRIG 174 (H) 02/02/2020 1604   TRIG CANCELED 08/15/2015 1340   HDL 109 02/02/2020 1604   VLDL 34 (H) 05/10/2015 1534   LDLCALC 85 02/02/2020 1604    HgbA1C:  Lab Results  Component Value Date   HGBA1C 7.2 (H) 01/27/2020    Urine Drug Screen:      Component Value Date/Time   LABOPIA NONE DETECTED 03/03/2020 0440   COCAINSCRNUR NONE DETECTED 03/03/2020 0440   LABBENZ NONE DETECTED 03/03/2020 0440   AMPHETMU NONE DETECTED 03/03/2020 0440   THCU NONE  DETECTED 03/03/2020 0440   LABBARB NONE DETECTED 03/03/2020 0440    Alcohol Level:  Recent Labs  Lab 03/02/20 2027  ETH <10    Other results: EKG: normal EKG, normal sinus rhythm, unchanged from previous tracings.  Imaging: ECHOCARDIOGRAM COMPLETE  Result Date: 03/05/2020    ECHOCARDIOGRAM REPORT   Patient Name:   LEO FRAY Date of Exam: 03/05/2020 Medical Rec #:  [id number]        Height:       72.0 in Accession #:    [id number]       Weight:       263.4 lb Date of Birth:  [birth year]         BSA:          2.395 m Patient Age:    58 years         BP:           127/86 mmHg Patient Gender: M                HR:           92 bpm. Exam Location:  [facility] Procedure: 2D Echo and Intracardiac Opacification Agent Indications:     Bacteremia R78.81  History:  Patient has prior history of Echocardiogram examinations, most                  recent 01/28/2020.  Sonographer:     Arville Go RDCS Referring Phys:  [id number] Bradly Bienenstock Diagnosing Phys: Kate Sable MD  Sonographer Comments: Technically difficult study due to poor echo windows. Image acquisition challenging due to patient body habitus. IMPRESSIONS  1. Left ventricular ejection fraction, by estimation, is 55 to 60%. The left ventricle has normal function. The left ventricle has no regional wall motion abnormalities. Left ventricular diastolic parameters are consistent with Grade I diastolic dysfunction (impaired relaxation).  2. Right ventricular systolic function is normal. The right ventricular size is normal.  3. The mitral valve is normal in structure. No evidence of mitral valve regurgitation. No evidence of mitral stenosis.  4. The aortic valve is normal in structure. Aortic valve regurgitation is not visualized. No aortic stenosis is present.  5. The inferior vena cava is normal in size with greater than 50% respiratory variability, suggesting right atrial pressure of 3 mmHg. Conclusion(s)/Recommendation(s): No evidence of  valvular vegetations on this transthoracic echocardiogram. Would recommend a transesophageal echocardiogram to exclude infective endocarditis if clinically indicated. FINDINGS  Left Ventricle: Left ventricular ejection fraction, by estimation, is 55 to 60%. The left ventricle has normal function. The left ventricle has no regional wall motion abnormalities. Definity contrast agent was given IV to delineate the left ventricular  endocardial borders. The left ventricular internal cavity size was normal in size. There is no left ventricular hypertrophy. Left ventricular diastolic parameters are consistent with Grade I diastolic dysfunction (impaired relaxation). Right Ventricle: The right ventricular size is normal. No increase in right ventricular wall thickness. Right ventricular systolic function is normal. Left Atrium: Left atrial size was normal in size. Right Atrium: Right atrial size was normal in size. Pericardium: There is no evidence of pericardial effusion. Mitral Valve: The mitral valve is normal in structure. Normal mobility of the mitral valve leaflets. No evidence of mitral valve regurgitation. No evidence of mitral valve stenosis. Tricuspid Valve: The tricuspid valve is normal in structure. Tricuspid valve regurgitation is mild . No evidence of tricuspid stenosis. Aortic Valve: The aortic valve is normal in structure. Aortic valve regurgitation is not visualized. No aortic stenosis is present. Aortic valve peak gradient measures 4.6 mmHg. Pulmonic Valve: The pulmonic valve was not well visualized. Pulmonic valve regurgitation is not visualized. No evidence of pulmonic stenosis. Aorta: The aortic root is normal in size and structure. Venous: The inferior vena cava is normal in size with greater than 50% respiratory variability, suggesting right atrial pressure of 3 mmHg. IAS/Shunts: No atrial level shunt detected by color flow Doppler.  LEFT VENTRICLE PLAX 2D LVIDd:         3.83 cm  Diastology LVIDs:          2.65 cm  LV e' lateral:   7.94 cm/s LV PW:         1.31 cm  LV E/e' lateral: 6.8 LV IVS:        1.32 cm  LV e' medial:    7.40 cm/s LVOT diam:     2.50 cm  LV E/e' medial:  7.3 LV SV:         86 LV SV Index:   36 LVOT Area:     4.91 cm  RIGHT VENTRICLE RV S prime:     17.30 cm/s LEFT ATRIUM  Index LA diam:        3.70 cm 1.54 cm/m LA Vol (A2C):   35.7 ml 14.90 ml/m LA Vol (A4C):   30.6 ml 12.78 ml/m LA Biplane Vol: 35.3 ml 14.74 ml/m  AORTIC VALVE                PULMONIC VALVE AV Area (Vmax): 3.86 cm    PV Vmax:       0.81 m/s AV Vmax:        107.00 cm/s PV Peak grad:  2.7 mmHg AV Peak Grad:   4.6 mmHg LVOT Vmax:      84.20 cm/s LVOT Vmean:     56.800 cm/s LVOT VTI:       0.176 m  AORTA Ao Root diam: 3.30 cm Ao Asc diam:  3.50 cm MITRAL VALVE               TRICUSPID VALVE MV Area (PHT): 4.06 cm    TV Peak grad:   25.4 mmHg MV Decel Time: 187 msec    TV Vmax:        2.52 m/s MV E velocity: 53.80 cm/s MV A velocity: 74.60 cm/s  SHUNTS MV E/A ratio:  0.72        Systemic VTI:  0.18 m                            Systemic Diam: 2.50 cm Debbe OdeaBrian Agbor-Etang MD Electronically signed by Debbe OdeaBrian Agbor-Etang MD Signature Date/Time: 03/05/2020/1:47:51 PM    Final      Assessment/Plan:  59 y.o. male PMH of Syncopal Episode, HTN, HLD, CVA,[facility] following treatment of unresponsivenesssecondary to seizuresctivity suspecteddueto ETOH withdrawal (pt was not discharged on anticonvulsants and pt refused MRI Brain during hospitalization), Arthritis, and Severe ETOH Abuse. [facility] via EMS on 06/9 with altered mental status. Per ER notes when EMSarrived at pts residence he was initially awake,but then began having agonal respirations followed by a grand mal seizure. Initial cardiac rhythm PEA, thereforeACLSprotocol initiatedwith ROSC following 2-3 minutes of CPR.  He was intubated and placed on mechanical ventilation and was admitted to  the ICU for further management. Patient is s/p extubation. Suspected possible seizure activity today.  Pt was started on dilantin.  Suspected of possible seizure activity today after which he was agitated.  - Usually post seizure activity you are post ictal not more aggressive. - s/p ativan, sleepy now.   - phenytoin level on high level yesterday 20.4 pharmacy dosing - EEG today to see - CIWA protocol to continue.  03/07/2020, 10:38 AM

## 2020-03-07 NOTE — Progress Notes (Signed)
Progress Note    Brad Graham  ZOX:[id number] DOB: 1961/03/27  DOA: 03/02/2020 PCP: Mardene Celeste I, NP      Brief Narrative:    Medical records reviewed and are as summarized below:  Brad Graham is a 59 y.o. male with a PMH of Syncopal Episode, HTN, HLD, CVA, Recently Discharged from [facility] 01/28/2020 following treatment of unresponsiveness secondary to seizure sctivity suspected due to ETOH withdrawal (pt was not discharged on anticonvulsants and pt refused MRI Brain during hospitalization), Arthritis, and Severe ETOH Abuse.  He presented to [facility] ER from a Group Home via EMS on 06/9 with altered mental status.  Per ER notes when EMS arrived at pts residence he was initially awake, but then began having agonal respirations followed by a grand mal seizure.  Initial cardiac rhythm PEA, therefore ACLS protocol initiated with ROSC following 2-3 minutes of CPR.  He was intubated and placed on mechanical ventilation and was admitted to the ICU for further management.  He also had cardiogenic shock and acute kidney injury complicated by hyperkalemia and metabolic acidosis.  He was treated with IV Dilantin, IV fluids and vasopressors.  He was given empiric IV antibiotics for aspiration pneumonia.  Neurologist was consulted to assist with management.      Assessment/Plan:   Principal Problem:   Cardiopulmonary arrest (HCC) Active Problems:   Seizure (HCC)   Hyperkalemia   Metabolic acidosis   Acute encephalopathy   Cardiogenic shock (HCC)   Hyperglycemia   Acute renal failure (HCC)   Goals of care, counseling/discussion   Palliative care by specialist    Acute hypoxemic and hypercapnic respiratory failure, s/p PEA cardiac arrest, aspiration pneumonia: S/p extubation on 03/04/2020  Seizure disorder EEG did not show any evidence of seizures so epileptiform discharges but it was suggestive of moderate diffuse encephalopathy.  Continue IV Dilantin.  Follow-up with  neurologist.  Aspiration pneumonia, coagulase-negative staph bacteremia in 1 bottle: Continue empiric IV antibiotics.  Follow-up with ID for further recommendations.  Speech therapist recommended n.p.o. status except medications.  AKI complicated by metabolic acidosis and hyperkalemia: Hyperkalemia and acidosis have resolved.  Creatinine is improving.  Continue IV fluids as patient is n.p.o.  Acute metabolic encephalopathy: Continue supportive care  Type 2 diabetes mellitus: NovoLog as needed for hyperglycemia  Thrombocytopenia: Platelet count is improving continue to monitor.  Borderline low magnesium and potassium: Improved.  Mild hypophosphatemia.  Monitor phosphorus level  Probable septic versus cardiogenic shock: Resolved  Alcohol use disorder: Ativan as needed for withdrawal syndrome  Body mass index is 35.73 kg/m.  (Morbid obesity)   Family Communication/Anticipated D/C date and plan/Code Status   DVT prophylaxis: SCD Code Status: DNR Family Communication: Plan discussed with patient Disposition Plan:    Status is: Inpatient  Remains inpatient appropriate because:Unsafe d/c plan, IV treatments appropriate due to intensity of illness or inability to take PO and Inpatient level of care appropriate due to severity of illness   Dispo: The patient is from: Group home              Anticipated d/c is to: Group home              Anticipated d/c date is: 2 days              Patient currently is not medically stable to d/c.           Subjective:   Patient was seen and at this morning and he had no  complaints.  Several minutes later, his nurse reported that she had witnessed seizure-like activity that lasted about 60 seconds while she was seen in his room.  Apparently, he became confused and irate after that.  He was given 2 mg of IV Ativan at that time.  Objective:    Vitals:   03/07/20 0423 03/07/20 0758 03/07/20 0815 03/07/20 1023  BP: (!) 151/84 (!) 164/83  (!) 164/90 (!) 171/80  Pulse: 85  88 83  Resp: (!) 24 20  20   Temp: 98.9 F (37.2 C) 98.1 F (36.7 C)    TempSrc: Oral Oral    SpO2: 98%   96%  Weight:      Height:       No data found.   Intake/Output Summary (Last 24 hours) at 03/07/2020 1553 Last data filed at 03/07/2020 1516 Gross per 24 hour  Intake 1914.2 ml  Output 500 ml  Net 1414.2 ml   Filed Weights   03/03/20 0500 03/04/20 0613 03/05/20 0500  Weight: 117.8 kg 121.6 kg 119.5 kg    Exam:  GEN: NAD SKIN: No rash EYES: No pallor or icterus  ENT: MMM CV: RRR PULM: CTA B ABD: soft, obese, NT, +BS CNS: AAO x 2 (person and place), confused, non focal EXT: No edema or tenderness   Data Reviewed:   I have personally reviewed following labs and imaging studies:  Labs: Labs show the following:   Basic Metabolic Panel: Recent Labs  Lab 03/02/20 2311 03/02/20 2311 03/03/20 0547 03/03/20 0547 03/03/20 1353 03/03/20 1353 03/04/20 0438 03/04/20 0438 03/05/20 0418 03/05/20 0418 03/06/20 0417 03/07/20 0517 03/07/20 0924  NA 139   < > 140   < > 139  --  140  --  145  --  140 136  --   K 6.9*   < > 4.7   < > 4.1   < > 3.7   < > 3.4*   < > 3.6 3.9  --   CL 106   < > 100   < > 98  --  98  --  101  --  100 98  --   CO2 15*   < > 15*   < > 18*  --  30  --  32  --  27 26  --   GLUCOSE 225*   < > 250*   < > 387*  --  322*  --  115*  --  121* 112*  --   BUN 57*   < > 57*   < > 54*  --  44*  --  29*  --  19 16  --   CREATININE 3.62*   < > 3.57*   < > 3.35*  --  2.21*  --  1.50*  --  1.36* 1.10  --   CALCIUM 7.2*   < > 7.0*   < > 6.5*  --  6.3*  --  7.2*  --  7.4* 7.9*  --   MG 1.8  --  2.4  --   --   --  1.6*  --   --   --  1.7  --  1.7  PHOS 9.4*  --  7.1*  --   --   --  3.3  --   --   --   --  2.1*  --    < > = values in this interval not displayed.   GFR Estimated Creatinine Clearance: 95.4 mL/min (by C-G formula  based on SCr of 1.1 mg/dL). Liver Function Tests: Recent Labs  Lab 03/02/20 2001  03/03/20 0547 03/04/20 0438 03/07/20 0517  AST 38 33 18  --   ALT 26 24 18   --   ALKPHOS 79 75 56  --   BILITOT 1.7* 1.8* 0.6  --   PROT 7.1 6.4* 5.6*  --   ALBUMIN 3.6 3.2* 2.6* 2.8*   No results for input(s): LIPASE, AMYLASE in the last 168 hours. No results for input(s): AMMONIA in the last 168 hours. Coagulation profile No results for input(s): INR, PROTIME in the last 168 hours.  CBC: Recent Labs  Lab 03/02/20 2001 03/03/20 0547 03/04/20 0438 03/05/20 0418 03/07/20 0517  WBC 8.0 11.4* 6.0 7.4 7.3  NEUTROABS 5.0  --   --   --  4.9  HGB 9.9* 10.1* 9.4* 9.3* 10.0*  HCT 32.6* 31.1* 26.6* 28.8* 31.1*  MCV 98.2 90.9 83.9 90.3 90.9  PLT 171 143* 123* 103* 125*   Cardiac Enzymes: Recent Labs  Lab 03/03/20 0547  CKTOTAL 119   BNP (last 3 results) No results for input(s): PROBNP in the last 8760 hours. CBG: Recent Labs  Lab 03/06/20 2100 03/07/20 0055 03/07/20 0330 03/07/20 0800 03/07/20 1158  GLUCAP 116* 112* 119* 102* 131*   D-Dimer: No results for input(s): DDIMER in the last 72 hours. Hgb A1c: No results for input(s): HGBA1C in the last 72 hours. Lipid Profile: No results for input(s): CHOL, HDL, LDLCALC, TRIG, CHOLHDL, LDLDIRECT in the last 72 hours. Thyroid function studies: No results for input(s): TSH, T4TOTAL, T3FREE, THYROIDAB in the last 72 hours.  Invalid input(s): FREET3 Anemia work up: No results for input(s): VITAMINB12, FOLATE, FERRITIN, TIBC, IRON, RETICCTPCT in the last 72 hours. Sepsis Labs: Recent Labs  Lab 03/02/20 2001 03/02/20 2001 03/02/20 2311 03/03/20 0500 03/03/20 0547 03/04/20 0438 03/05/20 0418 03/07/20 0517  PROCALCITON  --   --  0.55 0.54 0.82  --   --   --   WBC 8.0   < >  --   --  11.4* 6.0 7.4 7.3  LATICACIDVEN 4.5*  --  1.4  --   --   --   --   --    < > = values in this interval not displayed.    Microbiology Recent Results (from the past 240 hour(s))  Blood Culture (routine x 2)     Status: Abnormal  (Preliminary result)   Collection Time: 03/02/20  8:01 PM   Specimen: BLOOD  Result Value Ref Range Status   Specimen Description   Final    BLOOD LEFT ANTECUBITAL Performed at [facility], [address], [place], [place]    Special Requests   Final    BOTTLES DRAWN AEROBIC AND ANAEROBIC Blood Culture adequate volume Performed at [facility], [address] [place], [place]    Culture  Setup Time   Final    GRAM POSITIVE COCCI ANAEROBIC BOTTLE ONLY CRITICAL RESULT CALLED TO, READ BACK BY AND VERIFIED WITH: WALID NAZARI AT 1958 03/03/20 BY ACR.PMF AEROBIC BOTTLE ONLY GRAM POSITIVE RODS CRITICAL RESULT CALLED TO, READ BACK BY AND VERIFIED WITH: SCOTT HALL ON 03/04/20 AT 2212 [facility] Performed at [facility], [address] [place], [place]    Culture (A)  Final    STAPHYLOCOCCUS SPECIES (COAGULASE NEGATIVE) THE SIGNIFICANCE OF ISOLATING THIS ORGANISM FROM A SINGLE SET OF BLOOD CULTURES WHEN MULTIPLE SETS ARE DRAWN IS UNCERTAIN. PLEASE NOTIFY THE  MICROBIOLOGY DEPARTMENT WITHIN ONE WEEK IF SPECIATION AND SENSITIVITIES ARE REQUIRED. CULTURE REINCUBATED FOR BETTER GROWTH Performed at [facility], [place] [address] [place], [place] [address]    Report Status PENDING  Incomplete  Blood Culture ID Panel (Reflexed)     Status: Abnormal   Collection Time: 03/02/20  8:01 PM  Result Value Ref Range Status   Enterococcus species NOT DETECTED NOT DETECTED Final   Listeria monocytogenes NOT DETECTED NOT DETECTED Final   Staphylococcus species DETECTED (A) NOT DETECTED Final    Comment: Methicillin (oxacillin) susceptible coagulase negative staphylococcus. Possible blood culture contaminant (unless isolated from more than one blood culture draw or clinical case suggests pathogenicity). No antibiotic treatment is indicated for blood  culture contaminants. CRITICAL RESULT CALLED TO, READ BACK BY AND VERIFIED WITH: Rito Ehrlich  03/03/2020 @1958  BY ACR    Staphylococcus aureus (BCID) NOT DETECTED NOT DETECTED Final   Methicillin resistance NOT DETECTED NOT DETECTED Final   Streptococcus species NOT DETECTED NOT DETECTED Final   Streptococcus agalactiae NOT DETECTED NOT DETECTED Final   Streptococcus pneumoniae NOT DETECTED NOT DETECTED Final   Streptococcus pyogenes NOT DETECTED NOT DETECTED Final   Acinetobacter baumannii NOT DETECTED NOT DETECTED Final   Enterobacteriaceae species NOT DETECTED NOT DETECTED Final   Enterobacter cloacae complex NOT DETECTED NOT DETECTED Final   Escherichia coli NOT DETECTED NOT DETECTED Final   Klebsiella oxytoca NOT DETECTED NOT DETECTED Final   Klebsiella pneumoniae NOT DETECTED NOT DETECTED Final   Proteus species NOT DETECTED NOT DETECTED Final   Serratia marcescens NOT DETECTED NOT DETECTED Final   Haemophilus influenzae NOT DETECTED NOT DETECTED Final   Neisseria meningitidis NOT DETECTED NOT DETECTED Final   Pseudomonas aeruginosa NOT DETECTED NOT DETECTED Final   Candida albicans NOT DETECTED NOT DETECTED Final   Candida glabrata NOT DETECTED NOT DETECTED Final   Candida krusei NOT DETECTED NOT DETECTED Final   Candida parapsilosis NOT DETECTED NOT DETECTED Final   Candida tropicalis NOT DETECTED NOT DETECTED Final    Comment: Performed at [facility], [address] [place], [place] [address]  Blood Culture (routine x 2)     Status: None   Collection Time: 03/02/20  8:06 PM   Specimen: BLOOD  Result Value Ref Range Status   Specimen Description BLOOD  Final   Special Requests BOTTLES DRAWN AEROBIC AND ANAEROBIC  Final   Culture   Final    NO GROWTH 5 DAYS Performed at [facility], [address], [place], [place] [address]    Report Status 03/07/2020 FINAL  Final  SARS Coronavirus 2 by RT PCR (hospital order, performed in [facility] hospital lab) Nasopharyngeal Nasopharyngeal Swab     Status: None   Collection Time: 03/02/20  8:55 PM    Specimen: Nasopharyngeal Swab  Result Value Ref Range Status   SARS Coronavirus 2 NEGATIVE NEGATIVE Final    Comment: (NOTE) SARS-CoV-2 target nucleic acids are NOT DETECTED. The SARS-CoV-2 RNA is generally detectable in upper and lower respiratory specimens during the acute phase of infection. The lowest concentration of SARS-CoV-2 viral copies this assay can detect is 250 copies / mL. A negative result does not preclude SARS-CoV-2 infection and should not be used as the sole basis for treatment or other patient management decisions.  A negative result may occur with improper specimen collection / handling, submission of specimen other than nasopharyngeal swab, presence of viral mutation(s) within the areas targeted by this assay, and inadequate number of  viral copies (<250 copies / mL). A negative result must be combined with clinical observations, patient history, and epidemiological information. Fact Sheet for Patients:   BoilerBrush.com.cyhttps://www.fda.gov/media/136312/download Fact Sheet for Healthcare Providers: https://pope.com/https://www.fda.gov/media/136313/download This test is not yet approved or cleared  by the [place] FDA and has been authorized for detection and/or diagnosis of SARS-CoV-2 by FDA under an Emergency Use Authorization (EUA).  This EUA will remain in effect (meaning this test can be used) for the duration of the COVID-19 declaration under Section 564(b)(1) of the Act, 21 U.S.C. section 360bbb-3(b)(1), unless the authorization is terminated or revoked sooner. Performed at [facility], [address] [place], [place] [address]   MRSA PCR Screening     Status: None   Collection Time: 03/02/20 10:30 PM   Specimen: Nasopharyngeal  Result Value Ref Range Status   MRSA by PCR NEGATIVE NEGATIVE Final    Comment:        The GeneXpert MRSA Assay (FDA approved for NASAL specimens only), is one component of a comprehensive MRSA colonization surveillance program. It is  not intended to diagnose MRSA infection nor to guide or monitor treatment for MRSA infections. Performed at [facility], [address] [place], [place] [address]   Urine culture     Status: None   Collection Time: 03/03/20  4:40 AM   Specimen: In/Out Cath Urine  Result Value Ref Range Status   Specimen Description   Final    IN/OUT CATH URINE Performed at [facility], [address] [place], [place] [address]    Special Requests   Final    NONE Performed at [facility], [address] [place], [place] [address]    Culture   Final    NO GROWTH Performed at [facility], [address] [place], [place] [address]    Report Status 03/04/2020 FINAL  Final    Procedures and diagnostic studies:  EEG  Result Date: 03/07/2020 Charlsie QuestYadav, Priyanka O, MD     03/07/2020  2:54 PM Patient Name: Brad DuttonDwayne A Cercone MRN: [id number] Epilepsy Attending: Charlsie QuestPriyanka O Yadav Referring Physician/Provider: Dr. Dianah FieldYuri Zeylikman Date: 03/07/2020 Duration: 22.19 minutes Patient history: 59 year old male with history of syncopal episode prior stroke and seizure suspected due to alcohol withdrawal who presented with altered mental status and grand mal seizure.  EEG to evaluate for seizures. Level of alertness: Awake, asleep AEDs during EEG study: Phenytoin, Ativan Technical aspects: This EEG study was done with scalp electrodes positioned according to the 10-20 International system of electrode placement. Electrical activity was acquired at a sampling rate of 500Hz  and reviewed with a high frequency filter of 70Hz  and a low frequency filter of 1Hz . EEG data were recorded continuously and digitally stored. Description: During awake state, no clear posterior dominant was seen.  Sleep was characterized by vertex waves, sleep spindles (12 to 14 Hz), maximal frontocentral region.  EEG showed continuous generalized 5 to 7 Hz theta slowing with overriding 15 to 18 Hz beta activity with  irregular morphology distributed symmetrically and diffusely.  Physiology photic driving was not seen during photic stimulation.  Hyperventilation was not performed.   ABNORMALITY -Continued slow, generalized -Excessive beta, generalized IMPRESSION: This study is suggestive of moderate diffuse encephalopathy, nonspecific etiology. The excessive beta activity seen in the background is most likely due to the effect of benzodiazepine and is a benign EEG pattern. No seizures or epileptiform discharges were seen throughout the recording. Priyanka Annabelle Harman Yadav    Medications:   .  insulin aspart  0-20 Units Subcutaneous Q4H  . LORazepam  0-4 mg Intravenous Q6H   Followed by  . [START ON 03/09/2020] LORazepam  0-4 mg Intravenous Q12H   Continuous Infusions: . sodium chloride Stopped (03/07/20 1516)  .  ceFAZolin (ANCEF) IV Stopped (03/07/20 1137)  . dextrose 5% lactated ringers 75 mL/hr at 03/07/20 1516  . famotidine (PEPCID) IV Stopped (03/07/20 0818)  . phenytoin (DILANTIN) IV Stopped (03/07/20 0739)  . potassium PHOSPHATE IVPB (in mmol) 10 mmol (03/07/20 1516)     LOS: 5 days   Mervin Ramires  [facility]     03/07/2020, 3:53 PM

## 2020-03-07 NOTE — Procedures (Signed)
Patient Name: Brad Graham  MRN: [id number]  Epilepsy Attending: Charlsie Quest  Referring Physician/Provider: Dr. Dianah Field Date: 03/07/2020 Duration: 22.19 minutes  Patient history: 59 year old male with history of syncopal episode prior stroke and seizure suspected due to alcohol withdrawal who presented with altered mental status and grand mal seizure.  EEG to evaluate for seizures.  Level of alertness: Awake, asleep  AEDs during EEG study: Phenytoin, Ativan  Technical aspects: This EEG study was done with scalp electrodes positioned according to the 10-20 International system of electrode placement. Electrical activity was acquired at a sampling rate of 500Hz  and reviewed with a high frequency filter of 70Hz  and a low frequency filter of 1Hz . EEG data were recorded continuously and digitally stored.   Description: During awake state, no clear posterior dominant was seen.  Sleep was characterized by vertex waves, sleep spindles (12 to 14 Hz), maximal frontocentral region.  EEG showed continuous generalized 5 to 7 Hz theta slowing with overriding 15 to 18 Hz beta activity with irregular morphology distributed symmetrically and diffusely.  Physiology photic driving was not seen during photic stimulation.  Hyperventilation was not performed.     ABNORMALITY -Continued slow, generalized -Excessive beta, generalized  IMPRESSION: This study is suggestive of moderate diffuse encephalopathy, nonspecific etiology. The excessive beta activity seen in the background is most likely due to the effect of benzodiazepine and is a benign EEG pattern. No seizures or epileptiform discharges were seen throughout the recording.  Arturo Freundlich 

## 2020-03-07 NOTE — Evaluation (Signed)
Occupational Therapy Evaluation Patient Details Name: Brad Graham MRN: [id number] DOB: 06-17-1961 Today's Date: 03/07/2020    History of Present Illness admitted for acute hospitalization s/p PEA arrest after grand mal seizures, cardiogenic shock (requiring CPR x2-3 minutes), requiring intubation for airway protection 6/9-6/11.   Clinical Impression   Pt was seen for OT evaluation this date. Prior to hospital admission, pt was Independent c ADLs. Pt lives in a group home c bedroom on second floor. Pt presents to acute OT demonstrating impaired ADL performance and functional mobility 2/2 functionalist balance/ROM/strength deficits, decreased activity tolerance, decreased safety awareness, and decreased LB access. Pt is oriented to self and location, able to follow simple commands - benefits from visual/tactile cues. Pt currently requires MOD A x2 for LBD sitting EOB (assist for sitting balance and LB access). SETUP + MOD A face wshing sitting EOB - assist to maintain balance 2/2 L lateral and posterior lean. MAX A x2 sup<>sit, unsafe to attempt standing trials this date. Pt would benefit from skilled OT to address noted impairments and functional limitations (see below for any additional details) in order to maximize safety and independence while minimizing falls risk and caregiver burden. Upon hospital discharge, recommend CIR to maximize pt safety and return to functional independence during meaningful occupations of daily life.     Follow Up Recommendations  CIR    Equipment Recommendations   (TBD at next venue of care)    Recommendations for Other Services       Precautions / Restrictions Precautions Precautions: Fall;Other (comment) (seizure) Restrictions Weight Bearing Restrictions: No      Mobility Bed Mobility Overal bed mobility: Needs Assistance Bed Mobility: Supine to Sit;Sit to Supine     Supine to sit: Max assist;+2 for physical assistance Sit to supine: Max  assist;+2 for physical assistance   General bed mobility comments: max/total assist for LE management, truncal elevation; hand-over-hand for UE placement on rails to assist as able.  Mod progressing to min assist for unsupported sitting balance (L post/lateral lean)  Transfers                 General transfer comment: unsafe/unable    Balance Overall balance assessment: Needs assistance Sitting-balance support: No upper extremity supported;Feet supported Sitting balance-Leahy Scale: Poor Sitting balance - Comments: L posterior/lateral lean, limited attempts to self-correct initially Postural control: Left lateral lean;Posterior lean                                 ADL either performed or assessed with clinical judgement   ADL Overall ADL's : Needs assistance/impaired                                       General ADL Comments: MOD A x2 LBD sitting EOB. SETUP + MOD A face washing sitting EOB - assist to maintain balance.      Vision         Perception     Praxis      Pertinent Vitals/Pain Pain Assessment: No/denies pain Pain Score: 0-No pain     Hand Dominance Right   Extremity/Trunk Assessment Upper Extremity Assessment Upper Extremity Assessment: LUE deficits/detail (R UE grossly 4-/5 throughout) LUE Deficits / Details: Unable to achieve AROM supination. L UE grossly 3-/5 throughout   Lower Extremity Assessment Lower Extremity Assessment:  (R LE  grossly 4-/5 throughout, L LE grossly 3-/5 throughout)       Communication Communication Communication: No difficulties   Cognition Arousal/Alertness: Awake/alert (Intermittently closing eyes t/o session) Behavior During Therapy: WFL for tasks assessed/performed;Flat affect Overall Cognitive Status: Difficult to assess                                 General Comments: Oriented to self, location as hospital; follows simple commands; pleasant and cooperative throughout  session   General Comments       Exercises Exercises: Other exercises Other Exercises Other Exercises: Pt and caregiver educated re: OT role, d/c recs, AAROM HEP, falls prevention, importance of bed in chair for functional strengthening, energy conservation Other Exercises: LBD, face washing, sup<>sit, sitting balance/tolerance, AAROM BUE, functional reach inside/outside BOS   Shoulder Instructions      Home Living Family/patient expects to be discharged to:: Group home                                 Additional Comments: From previous documentation (May, 2021), confirmed by patient/family in room: Pt has his own bedroom on the second floor of the boarding house.  He uses a shared bathroom with tub/shower unit.  Pt reports there are grab bars by the toilet.  Pt reports getting meals from a food pantry next to the group home.      Prior Functioning/Environment Level of Independence: Independent with assistive device(s)        Comments: Per previous documentation (May, 2021): Pt uses [facility] for household mobility.  He spends most of his days at home, getting meals nearby the group home.  He sponge bathes by sitting near the tub and washing self independently.  He reports being mod I in personal hygiene and cleaning his room.  Pt relies on friends for transportation.  Pt does not take any medications at home other than ibuprofen PRN.  He is on disability.        OT Problem List: Decreased strength;Decreased range of motion;Decreased activity tolerance;Impaired balance (sitting and/or standing);Decreased coordination;Decreased cognition      OT Treatment/Interventions: Self-care/ADL training;Therapeutic exercise;Neuromuscular education;Energy conservation;DME and/or AE instruction;Therapeutic activities;Balance training;Patient/family education    OT Goals(Current goals can be found in the care plan section) Acute Rehab OT Goals Patient Stated Goal: to get better OT Goal  Formulation: With patient/family Time For Goal Achievement: 03/21/20 Potential to Achieve Goals: Good ADL Goals Pt Will Perform Grooming: with set-up;with min assist;sitting Pt Will Perform Lower Body Dressing: with set-up;with min assist;sitting/lateral leans Pt Will Transfer to Toilet: with min assist;with +2 assist (rolling at bed level) Additional ADL Goal #1: Pt will tolerate >5 mins sitting EOB c SBA in preparation for funcitonal ADL tasks  OT Frequency: Min 2X/week   Barriers to D/C: Inaccessible home environment;Decreased caregiver support          Co-evaluation PT/OT/SLP Co-Evaluation/Treatment: Yes Reason for Co-Treatment: Complexity of the patient's impairments (multi-system involvement);Necessary to address cognition/behavior during functional activity;For patient/therapist safety;To address functional/ADL transfers PT goals addressed during session: Mobility/safety with mobility;Balance;Strengthening/ROM OT goals addressed during session: ADL's and self-care      AM-PAC OT "6 Clicks" Daily Activity     Outcome Measure Help from another person eating meals?: A Little Help from another person taking care of personal grooming?: A Little Help from another person toileting, which includes using toliet,  bedpan, or urinal?: A Lot Help from another person bathing (including washing, rinsing, drying)?: A Lot Help from another person to put on and taking off regular upper body clothing?: A Lot Help from another person to put on and taking off regular lower body clothing?: A Lot 6 Click Score: 14   End of Session Nurse Communication: Mobility status  Activity Tolerance: Patient tolerated treatment well Patient left: in bed;with call bell/phone within reach;with bed alarm set;with family/visitor present (bed in chair position)  OT Visit Diagnosis: Unsteadiness on feet (R26.81);Other abnormalities of gait and mobility (R26.89);Muscle weakness (generalized) (M62.81)                 Time: 5732-2025 OT Time Calculation (min): 37 min Charges:  OT General Charges $OT Visit: 1 Visit OT Evaluation $OT Eval High Complexity: 1 High OT Treatments $Therapeutic Activity: 8-22 mins  Dessie Coma, M.S. OTR/L  03/07/20, 1:25 PM

## 2020-03-07 NOTE — Consult Note (Addendum)
NAME: Brad DuttonDwayne A Graham  DOB: 03/15/1961  MRN: [id number]  Date/Time: 03/07/2020 12:54 PM  REQUESTING PROVIDER:ayiku Subjective:  REASON FOR CONSULT: Coag neg staph bacteremia ? Brad Graham is a 59 y.o. male with a history of Alcohol abuse, seizures related to EtOH, CVA, hypertension, BPH, hyperlipidemia was brought in by EMS on 03/02/2020 with PEA with a rate of 20-40. Patient is in a group home and his roommates called 911 because of patient having altered mental status.  When the EMS arrived he was awake but confused and they tried to stand him up but then he started to fall and they put him on the stretcher.  He apparently was noted to have seizures and also became agonal  with a PEA and CPR was performed for 2 to 3 minutes with pulses returning.  Breathing was agonal in the ED and he was intubated.  After intubation patient began opening his eyes and had purposeful movement.  ER vital signs were blood pressure of 61/48, heart rate of 79, respiratory rate of 21 and pulse ox of 92% with temperature of 87 on arrival.  He was admitted to the ICU.  He also was noted to be in acute kidney injury and also had severe lactic acidosis.  ICU clinician started him on sodium bicarbonate drip, held his antihypertensive medicines aggressively fluid resuscitated him and started him on Levophed dopamine And vasopressin to maintain MAP of more than 65.  Because of aspiration pneumonia he was started on Unasyn.  Blood cultures were sent before starting antibiotics.  He had EEG which did not show any seizure activity. He was extubated on 03/04/2020.  He was transferred out of the ICU 03/06/2020. I am asked to see this patient because 1 out of 4 blood culture bottle had coag negative staph.   He recently was in [facility] between 01/27/2020 and 01/28/2020 with an unresponsive episode thought to be due to EtOH withdrawal seizure with postictal..  He also was diagnosed with new diabetes during that hospitalization.  He was asked to  follow-up with his PCP. Past Medical History:  Diagnosis Date  . Alcohol abuse   . Arthritis   . H/O: CVA (cerebrovascular accident)   . Hyperlipidemia   . Hypertension   . Syncope 07/02/2016  . Syncope and collapse 01/27/2020    Past Surgical History:  Procedure Laterality Date  . none      Social History   Socioeconomic History  . Marital status: Single    Spouse name: Not on file  . Number of children: Not on file  . Years of education: Not on file  . Highest education level: Not on file  Occupational History  . Not on file  Tobacco Use  . Smoking status: Current Every Day Smoker    Packs/day: 0.50    Types: Cigarettes  . Smokeless tobacco: Never Used  Vaping Use  . Vaping Use: Never used  Substance and Sexual Activity  . Alcohol use: Yes    Comment: fifth every other day  . Drug use: No  . Sexual activity: Never  Other Topics Concern  . Not on file  Social History Narrative  . Not on file   Social Determinants of Health   Financial Resource Strain:   . Difficulty of Paying Living Expenses:   Food Insecurity:   . Worried About Programme researcher, broadcasting/film/videounning Out of Food in the Last Year:   . Baristaan Out of Food in the Last Year:   Transportation Needs:   .  Lack of Transportation (Medical):   Marland Kitchen Lack of Transportation (Non-Medical):   Physical Activity:   . Days of Exercise per Week:   . Minutes of Exercise per Session:   Stress:   . Feeling of Stress :   Social Connections:   . Frequency of Communication with Friends and Family:   . Frequency of Social Gatherings with Friends and Family:   . Attends Religious Services:   . Active Member of Clubs or Organizations:   . Attends Archivist Meetings:   Marland Kitchen Marital Status:   Intimate Partner Violence:   . Fear of Current or Ex-Partner:   . Emotionally Abused:   Marland Kitchen Physically Abused:   . Sexually Abused:     Family History  Problem Relation Age of Onset  . Diabetes Mother   . Stroke Mother   . Heart disease Maternal  Grandmother   . Heart disease Maternal Grandfather    No Known Allergies  ? Current Facility-Administered Medications  Medication Dose Route Frequency Provider Last Rate Last Admin  . 0.9 %  sodium chloride infusion  250 mL Intravenous Continuous Awilda Bill, NP 10 mL/hr at 03/07/20 0708 250 mL at 03/07/20 0708  . ceFAZolin (ANCEF) IVPB 2g/100 mL premix  2 g Intravenous Q8H Hall, Scott A, RPH 200 mL/hr at 03/07/20 1105 2 g at 03/07/20 1105  . dextrose 5 % in lactated ringers infusion   Intravenous Continuous Jennye Boroughs, MD 75 mL/hr at 03/07/20 0810 New Bag at 03/07/20 0810  . famotidine (PEPCID) IVPB 20 mg premix  20 mg Intravenous Q24H Flora Lipps, MD 100 mL/hr at 03/07/20 0807 20 mg at 03/07/20 0807  . insulin aspart (novoLOG) injection 0-20 Units  0-20 Units Subcutaneous Q4H Flora Lipps, MD   3 Units at 03/06/20 1245  . LORazepam (ATIVAN) injection 2-4 mg  2-4 mg Intravenous Q1H PRN Awilda Bill, NP   2 mg at 03/07/20 1017  . ondansetron (ZOFRAN) injection 4 mg  4 mg Intravenous Q6H PRN Awilda Bill, NP      . phenytoin (DILANTIN) 250 mg in sodium chloride 0.9 % 100 mL IVPB  250 mg Intravenous BID Flora Lipps, MD 210 mL/hr at 03/07/20 0709 250 mg at 03/07/20 0709  . potassium PHOSPHATE 10 mmol in dextrose 5 % 250 mL infusion  10 mmol Intravenous Once Shanlever, Pierce Crane, RPH         Abtx:  Anti-infectives (From admission, onward)   Start     Dose/Rate Route Frequency Ordered Stop   03/05/20 0100  ceFAZolin (ANCEF) IVPB 2g/100 mL premix     Discontinue     2 g 200 mL/hr over 30 Minutes Intravenous Every 8 hours 03/05/20 0040     03/03/20 0100  Ampicillin-Sulbactam (UNASYN) 3 g in sodium chloride 0.9 % 100 mL IVPB  Status:  Discontinued        3 g 200 mL/hr over 30 Minutes Intravenous Every 12 hours 03/03/20 0003 03/03/20 1043      REVIEW OF SYSTEMS: Patient is a poor historian. Const: negative fever, negative chills, negative weight loss Eyes: negative  diplopia or visual changes, negative eye pain ENT: negative coryza, negative sore throat Resp: negative cough, hemoptysis, dyspnea Cards: negative for chest pain, palpitations, lower extremity edema GU: negative for frequency, dysuria and hematuria GI: Negative for abdominal pain, diarrhea, bleeding, constipation Skin: negative for rash and pruritus Heme: negative for easy bruising and gum/nose bleeding MS: Has weakness in pain Neurolo: Some confusion Psych:  negative for feelings of anxiety, depression  Endocrine: negative for thyroid, diabetes Allergy/Immunology- negative for any medication or food allergies ?  Objective:  VITALS:  BP (!) 171/80 (BP Location: Right Arm)   Pulse 83   Temp 98.1 F (36.7 C) (Oral)   Resp 20   Ht 6' (1.829 m)   Wt 119.5 kg   SpO2 96%   BMI 35.73 kg/m  PHYSICAL EXAM:  General: Patient is lying with his eyes closed.  On calling his name and talking to him he is oriented in person, place and knows the month.  Responds to questions appropriately most of the time.  Some confusion. Head: Normocephalic, without obvious abnormality, atraumatic. Eyes: Conjunctivae clear, anicteric sclerae. Pupils are equal ENT Nares normal. No drainage or sinus tenderness. Lips, mucosa, and tongue normal.  Poor dentition neck: Supple, symmetrical, no adenopathy, thyroid: non tender no carotid bruit and no JVD. Back: Did not examine. Lungs: Bilateral air entry Heart: S1-S2 Abdomen: Soft, non-tender,not distended. Bowel sounds normal. No masses Extremities: atraumatic, no cyanosis. No edema. No clubbing Skin: No rashes or lesions. Or bruising Lymph: Cervical, supraclavicular normal. Neurologic: Moves all extremities Pertinent Labs Lab Results CBC    Component Value Date/Time   WBC 7.3 03/07/2020 0517   RBC 3.42 (L) 03/07/2020 0517   HGB 10.0 (L) 03/07/2020 0517   HGB 11.7 (L) 02/16/2020 1426   HCT 31.1 (L) 03/07/2020 0517   HCT 35.0 (L) 02/16/2020 1426   PLT  125 (L) 03/07/2020 0517   PLT 192 02/16/2020 1426   MCV 90.9 03/07/2020 0517   MCV 88 02/16/2020 1426   MCV 94 05/02/2014 0134   MCH 29.2 03/07/2020 0517   MCHC 32.2 03/07/2020 0517   RDW 14.6 03/07/2020 0517   RDW 13.3 02/16/2020 1426   RDW 13.8 05/02/2014 0134   LYMPHSABS 1.3 03/07/2020 0517   LYMPHSABS 1.0 02/16/2020 1426   MONOABS 0.8 03/07/2020 0517   EOSABS 0.1 03/07/2020 0517   EOSABS 0.0 02/16/2020 1426   BASOSABS 0.1 03/07/2020 0517   BASOSABS 0.1 02/16/2020 1426    CMP Latest Ref Rng & Units 03/07/2020 03/06/2020 03/05/2020  Glucose 70 - 99 mg/dL 166(A) 630(Z) 601(U)  BUN 6 - 20 mg/dL 16 19 93(A)  Creatinine 0.61 - 1.24 mg/dL 3.55 7.32(K) 0.25(K)  Sodium 135 - 145 mmol/L 136 140 145  Potassium 3.5 - 5.1 mmol/L 3.9 3.6 3.4(L)  Chloride 98 - 111 mmol/L 98 100 101  CO2 22 - 32 mmol/L 26 27 32  Calcium 8.9 - 10.3 mg/dL 7.9(L) 7.4(L) 7.2(L)  Total Protein 6.5 - 8.1 g/dL - - -  Total Bilirubin 0.3 - 1.2 mg/dL - - -  Alkaline Phos 38 - 126 U/L - - -  AST 15 - 41 U/L - - -  ALT 0 - 44 U/L - - -      Microbiology: Recent Results (from the past 240 hour(s))  Blood Culture (routine x 2)     Status: Abnormal (Preliminary result)   Collection Time: 03/02/20  8:01 PM   Specimen: BLOOD  Result Value Ref Range Status   Specimen Description   Final    BLOOD LEFT ANTECUBITAL Performed at [facility], [address] [place], [place] [address]    Special Requests   Final    BOTTLES DRAWN AEROBIC AND ANAEROBIC Blood Culture adequate volume Performed at [facility], [address] [place], [place] [address]    Culture  Setup Time   Final  GRAM POSITIVE COCCI ANAEROBIC BOTTLE ONLY CRITICAL RESULT CALLED TO, READ BACK BY AND VERIFIED WITH: WALID NAZARI AT 1958 03/03/20 BY ACR.PMF AEROBIC BOTTLE ONLY GRAM POSITIVE RODS CRITICAL RESULT CALLED TO, READ BACK BY AND VERIFIED WITH: SCOTT HALL ON 03/04/20 AT [address] [facility] Performed at [facility], [address] [place], [place] [address]    Culture (A)  Final    STAPHYLOCOCCUS SPECIES (COAGULASE NEGATIVE) THE SIGNIFICANCE OF ISOLATING THIS ORGANISM FROM A SINGLE SET OF BLOOD CULTURES WHEN MULTIPLE SETS ARE DRAWN IS UNCERTAIN. PLEASE NOTIFY THE MICROBIOLOGY DEPARTMENT WITHIN ONE WEEK IF SPECIATION AND SENSITIVITIES ARE REQUIRED. CULTURE REINCUBATED FOR BETTER GROWTH Performed at [facility], [address], [place], [place] [address]    Report Status PENDING  Incomplete  Blood Culture ID Panel (Reflexed)     Status: Abnormal   Collection Time: 03/02/20  8:01 PM  Result Value Ref Range Status   Enterococcus species NOT DETECTED NOT DETECTED Final   Listeria monocytogenes NOT DETECTED NOT DETECTED Final   Staphylococcus species DETECTED (A) NOT DETECTED Final    Comment: Methicillin (oxacillin) susceptible coagulase negative staphylococcus. Possible blood culture contaminant (unless isolated from more than one blood culture draw or clinical case suggests pathogenicity). No antibiotic treatment is indicated for blood  culture contaminants. CRITICAL RESULT CALLED TO, READ BACK BY AND VERIFIED WITH: Mila Merry 03/03/2020 @1958  BY ACR    Staphylococcus aureus (BCID) NOT DETECTED NOT DETECTED Final   Methicillin resistance NOT DETECTED NOT DETECTED Final   Streptococcus species NOT DETECTED NOT DETECTED Final   Streptococcus agalactiae NOT DETECTED NOT DETECTED Final   Streptococcus pneumoniae NOT DETECTED NOT DETECTED Final   Streptococcus pyogenes NOT DETECTED NOT DETECTED Final   Acinetobacter baumannii NOT DETECTED NOT DETECTED Final   Enterobacteriaceae species NOT DETECTED NOT DETECTED Final   Enterobacter cloacae complex NOT DETECTED NOT DETECTED Final   Escherichia coli NOT DETECTED NOT DETECTED Final   Klebsiella oxytoca NOT DETECTED NOT DETECTED Final   Klebsiella pneumoniae NOT DETECTED NOT DETECTED Final   Proteus species NOT DETECTED NOT DETECTED Final   Serratia  marcescens NOT DETECTED NOT DETECTED Final   Haemophilus influenzae NOT DETECTED NOT DETECTED Final   Neisseria meningitidis NOT DETECTED NOT DETECTED Final   Pseudomonas aeruginosa NOT DETECTED NOT DETECTED Final   Candida albicans NOT DETECTED NOT DETECTED Final   Candida glabrata NOT DETECTED NOT DETECTED Final   Candida krusei NOT DETECTED NOT DETECTED Final   Candida parapsilosis NOT DETECTED NOT DETECTED Final   Candida tropicalis NOT DETECTED NOT DETECTED Final    Comment: Performed at [facility], [address] [place], [place]  Blood Culture (routine x 2)     Status: None   Collection Time: 03/02/20  8:06 PM   Specimen: BLOOD  Result Value Ref Range Status   Specimen Description BLOOD  Final   Special Requests BOTTLES DRAWN AEROBIC AND ANAEROBIC  Final   Culture   Final    NO GROWTH 5 DAYS Performed at [facility], [address], [place], [place]    Report Status 03/07/2020 FINAL  Final  SARS Coronavirus 2 by RT PCR (hospital order, performed in [facility] hospital lab) Nasopharyngeal Nasopharyngeal Swab     Status: None   Collection Time: 03/02/20  8:55 PM   Specimen: Nasopharyngeal Swab  Result Value Ref Range Status   SARS Coronavirus 2 NEGATIVE NEGATIVE Final    Comment: (NOTE) SARS-CoV-2 target nucleic acids  are NOT DETECTED. The SARS-CoV-2 RNA is generally detectable in upper and lower respiratory specimens during the acute phase of infection. The lowest concentration of SARS-CoV-2 viral copies this assay can detect is 250 copies / mL. A negative result does not preclude SARS-CoV-2 infection and should not be used as the sole basis for treatment or other patient management decisions.  A negative result may occur with improper specimen collection / handling, submission of specimen other than nasopharyngeal swab, presence of viral mutation(s) within the areas targeted by this assay, and inadequate number of viral  copies (<250 copies / mL). A negative result must be combined with clinical observations, patient history, and epidemiological information. Fact Sheet for Patients:   BoilerBrush.com.cy Fact Sheet for Healthcare Providers: https://pope.com/ This test is not yet approved or cleared  by the [place] FDA and has been authorized for detection and/or diagnosis of SARS-CoV-2 by FDA under an Emergency Use Authorization (EUA).  This EUA will remain in effect (meaning this test can be used) for the duration of the COVID-19 declaration under Section 564(b)(1) of the Act, 21 U.S.C. section 360bbb-3(b)(1), unless the authorization is terminated or revoked sooner. Performed at [facility], [address] [place], [place] [address]   MRSA PCR Screening     Status: None   Collection Time: 03/02/20 10:30 PM   Specimen: Nasopharyngeal  Result Value Ref Range Status   MRSA by PCR NEGATIVE NEGATIVE Final    Comment:        The GeneXpert MRSA Assay (FDA approved for NASAL specimens only), is one component of a comprehensive MRSA colonization surveillance program. It is not intended to diagnose MRSA infection nor to guide or monitor treatment for MRSA infections. Performed at [facility], [address] [place], [place] [address]   Urine culture     Status: None   Collection Time: 03/03/20  4:40 AM   Specimen: In/Out Cath Urine  Result Value Ref Range Status   Specimen Description   Final    IN/OUT CATH URINE Performed at [facility], [address] [place], [place] [address]    Special Requests   Final    NONE Performed at [facility], [address], [place], [place] [address]    Culture   Final    NO GROWTH Performed at [facility], [address] [place], [place] [address]    Report Status 03/04/2020 FINAL  Final    IMAGING RESULTS: No acute intracranial abnormality. Stable  non contrast CT appearance of white matter small vessel disease since last month. 2. New mild to moderate sinus inflammation.  I have personally reviewed the films ?   Impression/Recommendation ?Coag negative staph in 1 of 4 blood culture bottle.  This is skin contaminant.  Does not need any treatment.  This is not the reason for his presentation. No hardware  Status post PEA cardiac arrest with acute hypoxemic and hypercapnic respiratory failure needing intubation.  Has been extubated on 03/04/2020.  Seizure disorder: Likely related to alcohol.  On phenytoin and Ativan een by neurologist.  EEG shows moderate diffuse encephalopathy.    AKI resolved Lactic acidosis resolved Anemia Thrombocytopenia could be from the alcohol  ID will sign off call if needed.  Note:  This document was prepared using Dragon voice recognition software and may include unintentional dictation errors.

## 2020-03-07 NOTE — Progress Notes (Signed)
Pt returned from EEG in stable condition. Pt does seem to be agitated.

## 2020-03-07 NOTE — Progress Notes (Signed)
Chart reviewed. Visited Pt in hopes of reassessing swallowing for possible Po's. Nsg present with Pt and reports episode of hallucination, confusion and aggressive behavior. Ativan given to calm. Pt is confused, lethargic, congested shallow breathing with weak cough similar to assessment on Saturday, high risk for aspiration. Rec consider temporary alternative method of nutrition until Pt is able to safely take PO's. F/u in am in hopes of improvement for bedside assessment and possible MBSS

## 2020-03-07 NOTE — Evaluation (Signed)
Physical Therapy Evaluation Patient Details Name: Brad Graham MRN: [id number] DOB: September 21, 1961 Today's Date: 03/07/2020   History of Present Illness  admitted for acute hospitalization s/p PEA arrest after grand mal seizures, cardiogenic shock (requiring CPR x2-3 minutes), requiring intubation for airway protection 6/9-6/11.  Clinical Impression  Patient slightly lethargic upon arrival to session, but awakens with cuing/interaction with therapist throughout session.  Speech largely garbled, but intelligible (improved with over-articulation/ennunciation).  Oriented to self, location as hospital (in [place]); follows simple commands; pleasant and cooperative, good participation throughout session.  Generally weak and deconditioned, but with noted weakness L (3-/5) > R (4-/5) hemi-body.  Patient reports mild baseline weakness in L due to previous CVA; visitor unaware/unable to confirm. Mild inattention to L UE, worsened with divided attention.   Currently requiring max/total assist +2 for bed mobility; mod progressing to min assist for unsupported sitting balance (L posterior/lateral lean, mild pushing).  Standing attempts deferred this session due to emphasis on sitting balance and core stability.  Will continue to assess/progress as medically appropriate in subsequent sessions. Would benefit from skilled PT to address above deficits and promote optimal return to PLOF.; recommend transition to acute inpatient rehab upon discharge for high-intensity, post-acute rehab services.      Follow Up Recommendations CIR    Equipment Recommendations  Rolling walker with 5" wheels;3in1 (PT)    Recommendations for Other Services       Precautions / Restrictions Precautions Precautions: (P) Fall;Other (comment) (seizure) Restrictions Weight Bearing Restrictions: (P) No      Mobility  Bed Mobility Overal bed mobility: Needs Assistance Bed Mobility: Supine to Sit;Sit to Supine     Supine to  sit: Max assist;+2 for physical assistance Sit to supine: Max assist;+2 for physical assistance   General bed mobility comments: max/total assist for LE management, truncal elevation; hand-over-hand for UE placement on rails to assist as able.  Mod progressing to min assist for unsupported sitting balance (L post/lateral lean)  Transfers                 General transfer comment: unsafe/unable  Ambulation/Gait             General Gait Details: unsafe/unable  Stairs            Wheelchair Mobility    Modified Rankin (Stroke Patients Only)       Balance Overall balance assessment: Needs assistance Sitting-balance support: No upper extremity supported;Feet supported Sitting balance-Leahy Scale: Poor Sitting balance - Comments: L posterior/lateral lean, limited attempts to self-correct initially                                     Pertinent Vitals/Pain Pain Assessment: (P) No/denies pain Pain Score: 0-No pain    Home Living Family/patient expects to be discharged to:: (P) Group home                 Additional Comments: (P) From previous documentation (May, 2021), confirmed by patient/family in room: Pt has his own bedroom on the second floor of the boarding house.  He uses a shared bathroom with tub/shower unit.  Pt reports there are grab bars by the toilet.  Pt reports getting meals from a food pantry next to the group home.    Prior Function Level of Independence: (P) Independent with assistive device(s)         Comments: (P) Per previous documentation (May, 2021):  Pt uses [facility] for household mobility.  He spends most of his days at home, getting meals nearby the group home.  He sponge bathes by sitting near the tub and washing self independently.  He reports being mod I in personal hygiene and cleaning his room.  Pt relies on friends for transportation.  Pt does not take any medications at home other than ibuprofen PRN.  He is on  disability.     Hand Dominance   Dominant Hand: (P) Right    Extremity/Trunk Assessment   Upper Extremity Assessment Upper Extremity Assessment: (P)  (R UE grossly 4-/5 throughout, L UE grossly 3-/5 throughout)    Lower Extremity Assessment Lower Extremity Assessment: (P)  (R LE grossly 4-/5 throughout, L LE grossly 3-/5 throughout)       Communication   Communication: (P) No difficulties  Cognition Arousal/Alertness: (P) Awake/alert (Intermittently closing eyes t/o session) Behavior During Therapy: (P) WFL for tasks assessed/performed;Flat affect Overall Cognitive Status: (P) Difficult to assess                                 General Comments: (P) Oriented to self, location as hospital; follows simple commands; pleasant and cooperative throughout session      General Comments      Exercises Other Exercises Other Exercises: Unsupported sitting, worked on dynamic reaching activities, all planes, to promote increased awareness of midline orientation and balance/righting reactions.  Initially requiring heavy mod assist to maintain midline (with mild pushing tendancies towards L), progressing to close sup with training. Other Exercises: Placed in chair position in bed end of session, good midline and overall positioning noted.  Family member at bedside for visit; RN informed/aware of position and to provide frequent checks.   Assessment/Plan    PT Assessment Patient needs continued PT services  PT Problem List Decreased strength;Decreased range of motion;Decreased cognition;Decreased balance;Decreased activity tolerance;Decreased mobility;Decreased coordination;Decreased knowledge of use of DME;Decreased safety awareness;Decreased knowledge of precautions;Cardiopulmonary status limiting activity       PT Treatment Interventions DME instruction;Gait training;Stair training;Functional mobility training;Therapeutic activities;Therapeutic exercise;Balance  training;Neuromuscular re-education;Cognitive remediation;Patient/family education    PT Goals (Current goals can be found in the Care Plan section)  Acute Rehab PT Goals Patient Stated Goal: to get better PT Goal Formulation: With patient/family Time For Goal Achievement: 03/21/20 Potential to Achieve Goals: Good    Frequency Min 2X/week   Barriers to discharge Decreased caregiver support      Co-evaluation PT/OT/SLP Co-Evaluation/Treatment: Yes Reason for Co-Treatment: (P) Complexity of the patient's impairments (multi-system involvement);Necessary to address cognition/behavior during functional activity;For patient/therapist safety;To address functional/ADL transfers PT goals addressed during session: (P) Mobility/safety with mobility;Balance;Strengthening/ROM OT goals addressed during session: (P) ADL's and self-care       AM-PAC PT "6 Clicks" Mobility  Outcome Measure Help needed turning from your back to your side while in a flat bed without using bedrails?: Total Help needed moving from lying on your back to sitting on the side of a flat bed without using bedrails?: Total Help needed moving to and from a bed to a chair (including a wheelchair)?: Total Help needed standing up from a chair using your arms (e.g., wheelchair or bedside chair)?: Total Help needed to walk in hospital room?: Total Help needed climbing 3-5 steps with a railing? : Total 6 Click Score: 6    End of Session Equipment Utilized During Treatment: Gait belt Activity Tolerance: Patient tolerated treatment well  Patient left: in bed;with call bell/phone within reach;with bed alarm set Nurse Communication: Mobility status PT Visit Diagnosis: Difficulty in walking, not elsewhere classified (R26.2);Other symptoms and signs involving the nervous system (R29.898)    Time: 4665-9935 PT Time Calculation (min) (ACUTE ONLY): 27 min   Charges:   PT Evaluation $PT Eval High Complexity: 1 High PT  Treatments $Neuromuscular Re-education: 8-22 mins       Tiahna Cure H. Manson Passey, PT, DPT, NCS 03/07/20, 1:19 PM [phone]

## 2020-03-08 DIAGNOSIS — E875 Hyperkalemia: Secondary | ICD-10-CM

## 2020-03-08 DIAGNOSIS — E872 Acidosis: Secondary | ICD-10-CM

## 2020-03-08 LAB — COMPREHENSIVE METABOLIC PANEL
ALT: 13 U/L (ref 0–44)
AST: 39 U/L (ref 15–41)
Albumin: 2.7 g/dL — ABNORMAL LOW (ref 3.5–5.0)
Alkaline Phosphatase: 79 U/L (ref 38–126)
Anion gap: 10 (ref 5–15)
BUN: 15 mg/dL (ref 6–20)
CO2: 25 mmol/L (ref 22–32)
Calcium: 8.1 mg/dL — ABNORMAL LOW (ref 8.9–10.3)
Chloride: 98 mmol/L (ref 98–111)
Creatinine, Ser: 1.04 mg/dL (ref 0.61–1.24)
GFR calc Af Amer: >60 mL/min (ref >60)
GFR calc non Af Amer: >60 mL/min (ref >60)
Glucose, Bld: 153 mg/dL — ABNORMAL HIGH (ref 70–99)
Potassium: 4 mmol/L (ref 3.5–5.1)
Sodium: 133 mmol/L — ABNORMAL LOW (ref 135–145)
Total Bilirubin: 0.8 mg/dL (ref 0.3–1.2)
Total Protein: 6.8 g/dL (ref 6.5–8.1)

## 2020-03-08 LAB — GLUCOSE, CAPILLARY
Glucose-Capillary: 152 mg/dL — ABNORMAL HIGH (ref 70–99)
Glucose-Capillary: 154 mg/dL — ABNORMAL HIGH (ref 70–99)
Glucose-Capillary: 197 mg/dL — ABNORMAL HIGH (ref 70–99)
Glucose-Capillary: 131 mg/dL — ABNORMAL HIGH (ref 70–99)
Glucose-Capillary: 193 mg/dL — ABNORMAL HIGH (ref 70–99)

## 2020-03-08 LAB — CULTURE, BLOOD (ROUTINE X 2): Special Requests: ADEQUATE

## 2020-03-08 LAB — CBC WITH DIFFERENTIAL/PLATELET
Abs Immature Granulocytes: 0.12 10*3/uL — ABNORMAL HIGH (ref 0.00–0.07)
Basophils Absolute: 0.1 10*3/uL (ref 0.0–0.1)
Basophils Relative: 1 %
Eosinophils Absolute: 0.1 10*3/uL (ref 0.0–0.5)
Eosinophils Relative: 1 %
HCT: 29.9 % — ABNORMAL LOW (ref 39.0–52.0)
Hemoglobin: 10.1 g/dL — ABNORMAL LOW (ref 13.0–17.0)
Immature Granulocytes: 2 %
Lymphocytes Relative: 22 %
Lymphs Abs: 1.3 10*3/uL (ref 0.7–4.0)
MCH: 29.2 pg (ref 26.0–34.0)
MCHC: 33.8 g/dL (ref 30.0–36.0)
MCV: 86.4 fL (ref 80.0–100.0)
Monocytes Absolute: 1 10*3/uL (ref 0.1–1.0)
Monocytes Relative: 17 %
Neutro Abs: 3.2 10*3/uL (ref 1.7–7.7)
Neutrophils Relative %: 57 %
Platelets: 134 10*3/uL — ABNORMAL LOW (ref 150–400)
RBC: 3.46 MIL/uL — ABNORMAL LOW (ref 4.22–5.81)
RDW: 14.4 % (ref 11.5–15.5)
WBC: 5.7 10*3/uL (ref 4.0–10.5)
nRBC: 0 % (ref 0.0–0.2)

## 2020-03-08 LAB — PHOSPHORUS: Phosphorus: 2.7 mg/dL (ref 2.5–4.6)

## 2020-03-08 LAB — MAGNESIUM: Magnesium: 1.7 mg/dL (ref 1.7–2.4)

## 2020-03-08 MED ORDER — PHENYTOIN SODIUM EXTENDED 100 MG PO CAPS
200.0000 mg | ORAL_CAPSULE | Freq: Every morning | ORAL | Status: DC
  Administered 2020-03-09 – 2020-03-13 (×4): 200 mg via ORAL
  Filled 2020-03-08 (×6): qty 2

## 2020-03-08 MED ORDER — CARVEDILOL 25 MG PO TABS
25.0000 mg | ORAL_TABLET | Freq: Two times a day (BID) | ORAL | Status: DC
  Administered 2020-03-08 – 2020-03-14 (×10): 25 mg via ORAL
  Filled 2020-03-08 (×11): qty 1

## 2020-03-08 MED ORDER — PHENYTOIN SODIUM EXTENDED 100 MG PO CAPS
300.0000 mg | ORAL_CAPSULE | Freq: Every day | ORAL | Status: DC
  Administered 2020-03-08 – 2020-03-13 (×6): 300 mg via ORAL
  Filled 2020-03-08 (×8): qty 3

## 2020-03-08 MED ORDER — AMOXICILLIN-POT CLAVULANATE 875-125 MG PO TABS
1.0000 | ORAL_TABLET | Freq: Two times a day (BID) | ORAL | Status: AC
  Administered 2020-03-08 – 2020-03-12 (×9): 1 via ORAL
  Filled 2020-03-08 (×9): qty 1

## 2020-03-08 MED ORDER — TAMSULOSIN HCL 0.4 MG PO CAPS
0.4000 mg | ORAL_CAPSULE | Freq: Every day | ORAL | Status: DC
  Administered 2020-03-08 – 2020-03-13 (×5): 0.4 mg via ORAL
  Filled 2020-03-08 (×5): qty 1

## 2020-03-08 MED ORDER — INSULIN ASPART 100 UNIT/ML ~~LOC~~ SOLN
0.0000 [IU] | Freq: Three times a day (TID) | SUBCUTANEOUS | Status: DC
  Administered 2020-03-08: 4 [IU] via SUBCUTANEOUS
  Administered 2020-03-08: 3 [IU] via SUBCUTANEOUS
  Administered 2020-03-08 – 2020-03-09 (×3): 4 [IU] via SUBCUTANEOUS
  Administered 2020-03-09 – 2020-03-10 (×3): 3 [IU] via SUBCUTANEOUS
  Administered 2020-03-10 (×2): 4 [IU] via SUBCUTANEOUS
  Administered 2020-03-11: 13:00:00 7 [IU] via SUBCUTANEOUS
  Administered 2020-03-11: 17:00:00 3 [IU] via SUBCUTANEOUS
  Administered 2020-03-12: 20 [IU] via SUBCUTANEOUS
  Administered 2020-03-12: 21:00:00 4 [IU] via SUBCUTANEOUS
  Administered 2020-03-13 (×2): 3 [IU] via SUBCUTANEOUS
  Administered 2020-03-13: 4 [IU] via SUBCUTANEOUS
  Filled 2020-03-08 (×18): qty 1

## 2020-03-08 MED ORDER — MAGNESIUM SULFATE 2 GM/50ML IV SOLN
2.0000 g | Freq: Once | INTRAVENOUS | Status: AC
  Administered 2020-03-08: 13:00:00 2 g via INTRAVENOUS
  Filled 2020-03-08: qty 50

## 2020-03-08 MED ORDER — AMLODIPINE BESYLATE 5 MG PO TABS
5.0000 mg | ORAL_TABLET | Freq: Every day | ORAL | Status: DC
  Administered 2020-03-08 – 2020-03-14 (×7): 5 mg via ORAL
  Filled 2020-03-08 (×7): qty 1

## 2020-03-08 MED ORDER — HYDRALAZINE HCL 20 MG/ML IJ SOLN: 10.0000 mg | Freq: Four times a day (QID) | INTRAMUSCULAR | Status: DC | PRN

## 2020-03-08 NOTE — Progress Notes (Addendum)
Progress Note    Brad Graham  HGD:[id number] DOB: 1961/02/26  DOA: 03/02/2020 PCP: Mardene Celeste I, NP      Brief Narrative:    Medical records reviewed and are as summarized below:  Brad Graham is a 59 y.o. male with a PMH of Syncopal Episode, HTN, HLD, CVA, Recently Discharged from [facility] 01/28/2020 following treatment of unresponsiveness secondary to seizure sctivity suspected due to ETOH withdrawal (pt was not discharged on anticonvulsants and pt refused MRI Brain during hospitalization), Arthritis, and Severe ETOH Abuse.  He presented to [facility] ER from a Group Home via EMS on 06/9 with altered mental status.  Per ER notes when EMS arrived at pts residence he was initially awake, but then began having agonal respirations followed by a grand mal seizure.  Initial cardiac rhythm PEA, therefore ACLS protocol initiated with ROSC following 2-3 minutes of CPR.  He was intubated and placed on mechanical ventilation and was admitted to the ICU for further management.  It is not clear whether he had cardiogenic shock versus septic shock.  He also had  acute kidney injury complicated by hyperkalemia and metabolic acidosis.  He was treated with IV Dilantin, IV fluids and vasopressors.  He was given empiric IV antibiotics for aspiration pneumonia.  Neurologist was consulted to assist with management.  ID was consulted because of coagulase-negative staph isolated in 1 blood culture bottle.  This was deemed to be a contaminant.  He was evaluated by PT and OT recommended further rehabilitation at the inpatient rehab center.      Assessment/Plan:   Principal Problem:   Cardiopulmonary arrest (HCC) Active Problems:   Seizure (HCC)   Hyperkalemia   Metabolic acidosis   Acute encephalopathy   Cardiogenic shock (HCC)   Hyperglycemia   Acute renal failure (HCC)   Goals of care, counseling/discussion   Palliative care by specialist    Acute hypoxemic and hypercapnic respiratory  failure, s/p PEA cardiac arrest, aspiration pneumonia: S/p extubation on 03/04/2020  Seizure disorder EEG did not show any evidence of seizures so epileptiform discharges but it was suggestive of moderate diffuse encephalopathy.  Continue IV Dilantin.  Follow-up with neurologist.  Aspiration pneumonia, coagulase-negative staph bacteremia in 1 bottle: Change IV surface of to oral Augmentin. CONS in 1 blood culture bottle is a contaminant per ID.   Speech therapist recommended mechanical soft diet for dysphagia  AKI complicated by metabolic acidosis and hyperkalemia: Resolved.  Discontinue IV fluids   Acute metabolic encephalopathy: Continue supportive care  Type 2 diabetes mellitus: NovoLog as needed for hyperglycemia  Thrombocytopenia: Platelet count is improving continue to monitor.  Magnesium, potassium and phosphorus levels are within normal limits.  Probable septic versus cardiogenic shock: Resolved  Alcohol use disorder: Ativan as needed for withdrawal syndrome  Body mass index is 35.73 kg/m.  (Morbid obesity)   Family Communication/Anticipated D/C date and plan/Code Status   DVT prophylaxis: SCD Code Status: DNR Family Communication: Plan discussed with patient's sister, Brad Graham.  Disposition Plan:    Status is: Inpatient  Remains inpatient appropriate because:Unsafe d/c plan   Dispo: The patient is from: Group home              Anticipated d/c is to: CIR              Anticipated d/c date is: 1 day              Patient currently is medically stable to d/c.  Subjective:   He has no complaints.  He feels better this morning.  He was able to participate in swallow evaluation.  Objective:    Vitals:   03/08/20 0449 03/08/20 0800 03/08/20 0821 03/08/20 1159  BP: (!) 152/75 (!) 197/79 (!) 176/72 (!) 170/89  Pulse: 92 94 92 97  Resp: (!) 21 18  20   Temp: 98.1 F (36.7 C) 98.4 F (36.9 C)  98.2 F (36.8 C)  TempSrc:  Axillary  Oral  SpO2:   98%  98%  Weight:      Height:       No data found.   Intake/Output Summary (Last 24 hours) at 03/08/2020 1238 Last data filed at 03/08/2020 1000 Gross per 24 hour  Intake 1181 ml  Output 2100 ml  Net -919 ml   Filed Weights   03/03/20 0500 03/04/20 0613 03/05/20 0500  Weight: 117.8 kg 121.6 kg 119.5 kg    Exam:  GEN: NAD SKIN: No rash EYES: No pallor or icterus  ENT: MMM CV: RRR PULM: No wheezing or rales heard ABD: soft, obese, NT, +BS CNS: AAO x 3, slurred speech at baseline, non focal EXT: No edema or tenderness   Data Reviewed:   I have personally reviewed following labs and imaging studies:  Labs: Labs show the following:   Basic Metabolic Panel: Recent Labs  Lab 03/02/20 2311 03/02/20 2311 03/03/20 0547 03/03/20 1353 03/04/20 0438 03/04/20 0438 03/05/20 0418 03/05/20 0418 03/06/20 0417 03/06/20 0417 03/07/20 0517 03/07/20 0924 03/08/20 0509  NA 139   < > 140   < > 140  --  145  --  140  --  136  --  133*  K 6.9*   < > 4.7   < > 3.7   < > 3.4*   < > 3.6   < > 3.9  --  4.0  CL 106   < > 100   < > 98  --  101  --  100  --  98  --  98  CO2 15*   < > 15*   < > 30  --  32  --  27  --  26  --  25  GLUCOSE 225*   < > 250*   < > 322*  --  115*  --  121*  --  112*  --  153*  BUN 57*   < > 57*   < > 44*  --  29*  --  19  --  16  --  15  CREATININE 3.62*   < > 3.57*   < > 2.21*  --  1.50*  --  1.36*  --  1.10  --  1.04  CALCIUM 7.2*   < > 7.0*   < > 6.3*  --  7.2*  --  7.4*  --  7.9*  --  8.1*  MG 1.8   < > 2.4  --  1.6*  --   --   --  1.7  --   --  1.7 1.7  PHOS 9.4*  --  7.1*  --  3.3  --   --   --   --   --  2.1*  --  2.7   < > = values in this interval not displayed.   GFR Estimated Creatinine Clearance: 100.9 mL/min (by C-G formula based on SCr of 1.04 mg/dL). Liver Function Tests: Recent Labs  Lab 03/02/20 2001 03/03/20 0547 03/04/20 0438 03/07/20 0517 03/08/20  0509  AST 38 33 18  --  39  ALT 26 24 18   --  13  ALKPHOS 79 75 56  --  79    BILITOT 1.7* 1.8* 0.6  --  0.8  PROT 7.1 6.4* 5.6*  --  6.8  ALBUMIN 3.6 3.2* 2.6* 2.8* 2.7*   No results for input(s): LIPASE, AMYLASE in the last 168 hours. No results for input(s): AMMONIA in the last 168 hours. Coagulation profile No results for input(s): INR, PROTIME in the last 168 hours.  CBC: Recent Labs  Lab 03/02/20 2001 03/02/20 2001 03/03/20 0547 03/04/20 0438 03/05/20 0418 03/07/20 0517 03/08/20 0509  WBC 8.0   < > 11.4* 6.0 7.4 7.3 5.7  NEUTROABS 5.0  --   --   --   --  4.9 3.2  HGB 9.9*   < > 10.1* 9.4* 9.3* 10.0* 10.1*  HCT 32.6*   < > 31.1* 26.6* 28.8* 31.1* 29.9*  MCV 98.2   < > 90.9 83.9 90.3 90.9 86.4  PLT 171   < > 143* 123* 103* 125* 134*   < > = values in this interval not displayed.   Cardiac Enzymes: Recent Labs  Lab 03/03/20 0547  CKTOTAL 119   BNP (last 3 results) No results for input(s): PROBNP in the last 8760 hours. CBG: Recent Labs  Lab 03/07/20 1627 03/07/20 2040 03/08/20 0429 03/08/20 0759 03/08/20 1231  GLUCAP 155* 109* 152* 154* 197*   D-Dimer: No results for input(s): DDIMER in the last 72 hours. Hgb A1c: No results for input(s): HGBA1C in the last 72 hours. Lipid Profile: No results for input(s): CHOL, HDL, LDLCALC, TRIG, CHOLHDL, LDLDIRECT in the last 72 hours. Thyroid function studies: No results for input(s): TSH, T4TOTAL, T3FREE, THYROIDAB in the last 72 hours.  Invalid input(s): FREET3 Anemia work up: No results for input(s): VITAMINB12, FOLATE, FERRITIN, TIBC, IRON, RETICCTPCT in the last 72 hours. Sepsis Labs: Recent Labs  Lab 03/02/20 2001 03/02/20 2001 03/02/20 2311 03/03/20 0500 03/03/20 0547 03/03/20 0547 03/04/20 0438 03/05/20 0418 03/07/20 0517 03/08/20 0509  PROCALCITON  --   --  0.55 0.54 0.82  --   --   --   --   --   WBC 8.0   < >  --   --  11.4*   < > 6.0 7.4 7.3 5.7  LATICACIDVEN 4.5*  --  1.4  --   --   --   --   --   --   --    < > = values in this interval not displayed.     Microbiology Recent Results (from the past 240 hour(s))  Blood Culture (routine x 2)     Status: Abnormal (Preliminary result)   Collection Time: 03/02/20  8:01 PM   Specimen: BLOOD  Result Value Ref Range Status   Specimen Description   Final    BLOOD LEFT ANTECUBITAL Performed at [facility], [address] [place], [place]    Special Requests   Final    BOTTLES DRAWN AEROBIC AND ANAEROBIC Blood Culture adequate volume Performed at [facility], [address] [place], [place]    Culture  Setup Time   Final    GRAM POSITIVE COCCI ANAEROBIC BOTTLE ONLY CRITICAL RESULT CALLED TO, READ BACK BY AND VERIFIED WITH: Brad Graham AT 1958 03/03/20 BY ACR.PMF AEROBIC BOTTLE ONLY GRAM POSITIVE RODS CRITICAL RESULT CALLED TO, READ BACK BY AND VERIFIED WITH: Brad Graham ON 03/04/20  AT [address] [facility] Performed at [facility], [address] [place], [place] [address]    Culture (A)  Final    STAPHYLOCOCCUS SPECIES (COAGULASE NEGATIVE) THE SIGNIFICANCE OF ISOLATING THIS ORGANISM FROM A SINGLE SET OF BLOOD CULTURES WHEN MULTIPLE SETS ARE DRAWN IS UNCERTAIN. PLEASE NOTIFY THE MICROBIOLOGY DEPARTMENT WITHIN ONE WEEK IF SPECIATION AND SENSITIVITIES ARE REQUIRED. CULTURE REINCUBATED FOR BETTER GROWTH Performed at [facility], [address] [place], [place] [address]    Report Status PENDING  Incomplete  Blood Culture ID Panel (Reflexed)     Status: Abnormal   Collection Time: 03/02/20  8:01 PM  Result Value Ref Range Status   Enterococcus species NOT DETECTED NOT DETECTED Final   Listeria monocytogenes NOT DETECTED NOT DETECTED Final   Staphylococcus species DETECTED (A) NOT DETECTED Final    Comment: Methicillin (oxacillin) susceptible coagulase negative staphylococcus. Possible blood culture contaminant (unless isolated from more than one blood culture draw or clinical case suggests pathogenicity). No antibiotic treatment is indicated  for blood  culture contaminants. CRITICAL RESULT CALLED TO, READ BACK BY AND VERIFIED WITH: Brad Graham 03/03/2020  BY ACR    Staphylococcus aureus (BCID) NOT DETECTED NOT DETECTED Final   Methicillin resistance NOT DETECTED NOT DETECTED Final   Streptococcus species NOT DETECTED NOT DETECTED Final   Streptococcus agalactiae NOT DETECTED NOT DETECTED Final   Streptococcus pneumoniae NOT DETECTED NOT DETECTED Final   Streptococcus pyogenes NOT DETECTED NOT DETECTED Final   Acinetobacter baumannii NOT DETECTED NOT DETECTED Final   Enterobacteriaceae species NOT DETECTED NOT DETECTED Final   Enterobacter cloacae complex NOT DETECTED NOT DETECTED Final   Escherichia coli NOT DETECTED NOT DETECTED Final   Klebsiella oxytoca NOT DETECTED NOT DETECTED Final   Klebsiella pneumoniae NOT DETECTED NOT DETECTED Final   Proteus species NOT DETECTED NOT DETECTED Final   Serratia marcescens NOT DETECTED NOT DETECTED Final   Haemophilus influenzae NOT DETECTED NOT DETECTED Final   Neisseria meningitidis NOT DETECTED NOT DETECTED Final   Pseudomonas aeruginosa NOT DETECTED NOT DETECTED Final   Candida albicans NOT DETECTED NOT DETECTED Final   Candida glabrata NOT DETECTED NOT DETECTED Final   Candida krusei NOT DETECTED NOT DETECTED Final   Candida parapsilosis NOT DETECTED NOT DETECTED Final   Candida tropicalis NOT DETECTED NOT DETECTED Final    Comment: Performed at [facility], [address] [place], [place] [address]  Blood Culture (routine x 2)     Status: None   Collection Time: 03/02/20  8:06 PM   Specimen: BLOOD  Result Value Ref Range Status   Specimen Description BLOOD  Final   Special Requests BOTTLES DRAWN AEROBIC AND ANAEROBIC  Final   Culture   Final    NO GROWTH 5 DAYS Performed at [facility], [address], [place], [place] [address]    Report Status 03/07/2020 FINAL  Final  SARS Coronavirus 2 by RT PCR (hospital order, performed in [facility]  hospital lab) Nasopharyngeal Nasopharyngeal Swab     Status: None   Collection Time: 03/02/20  8:55 PM   Specimen: Nasopharyngeal Swab  Result Value Ref Range Status   SARS Coronavirus 2 NEGATIVE NEGATIVE Final    Comment: (NOTE) SARS-CoV-2 target nucleic acids are NOT DETECTED. The SARS-CoV-2 RNA is generally detectable in upper and lower respiratory specimens during the acute phase of infection. The lowest concentration of SARS-CoV-2 viral copies this assay can detect is 250 copies / mL. A negative result does not preclude  SARS-CoV-2 infection and should not be used as the sole basis for treatment or other patient management decisions.  A negative result may occur with improper specimen collection / handling, submission of specimen other than nasopharyngeal swab, presence of viral mutation(s) within the areas targeted by this assay, and inadequate number of viral copies (<250 copies / mL). A negative result must be combined with clinical observations, patient history, and epidemiological information. Fact Sheet for Patients:   BoilerBrush.com.cy Fact Sheet for Healthcare Providers: https://pope.com/ This test is not yet approved or cleared  by the [place] FDA and has been authorized for detection and/or diagnosis of SARS-CoV-2 by FDA under an Emergency Use Authorization (EUA).  This EUA will remain in effect (meaning this test can be used) for the duration of the COVID-19 declaration under Section 564(b)(1) of the Act, 21 U.S.C. section 360bbb-3(b)(1), unless the authorization is terminated or revoked sooner. Performed at [facility], [address] [place], [place] [address]   MRSA PCR Screening     Status: None   Collection Time: 03/02/20 10:30 PM   Specimen: Nasopharyngeal  Result Value Ref Range Status   MRSA by PCR NEGATIVE NEGATIVE Final    Comment:        The GeneXpert MRSA Assay (FDA approved for NASAL  specimens only), is one component of a comprehensive MRSA colonization surveillance program. It is not intended to diagnose MRSA infection nor to guide or monitor treatment for MRSA infections. Performed at [facility], [address] [place], [place] [address]   Urine culture     Status: None   Collection Time: 03/03/20  4:40 AM   Specimen: In/Out Cath Urine  Result Value Ref Range Status   Specimen Description   Final    IN/OUT CATH URINE Performed at [facility], [address] [place], [place] [address]    Special Requests   Final    NONE Performed at [facility], [address] [place], [place] [address]    Culture   Final    NO GROWTH Performed at [facility], [address] [place], [place] [address]    Report Status 03/04/2020 FINAL  Final    Procedures and diagnostic studies:  EEG  Result Date: 03/07/2020 Brad Quest, MD     03/07/2020  2:54 PM Patient Name: Brad Graham MRN: [id number] Epilepsy Attending: Charlsie Graham Referring Physician/Provider: Dr. Dianah Field Date: 03/07/2020 Duration: 22.19 minutes Patient history: 59 year old male with history of syncopal episode prior stroke and seizure suspected due to alcohol withdrawal who presented with altered mental status and grand mal seizure.  EEG to evaluate for seizures. Level of alertness: Awake, asleep AEDs during EEG study: Phenytoin, Ativan Technical aspects: This EEG study was done with scalp electrodes positioned according to the 10-20 International system of electrode placement. Electrical activity was acquired at a sampling rate of 500Hz  and reviewed with a high frequency filter of 70Hz  and a low frequency filter of 1Hz . EEG data were recorded continuously and digitally stored. Description: During awake state, no clear posterior dominant was seen.  Sleep was characterized by vertex waves, sleep spindles (12 to 14 Hz), maximal frontocentral region.  EEG  showed continuous generalized 5 to 7 Hz theta slowing with overriding 15 to 18 Hz beta activity with irregular morphology distributed symmetrically and diffusely.  Physiology photic driving was not seen during photic stimulation.  Hyperventilation was not performed.   ABNORMALITY -Continued slow, generalized -Excessive beta, generalized  IMPRESSION: This study is suggestive of moderate diffuse encephalopathy, nonspecific etiology. The excessive beta activity seen in the background is most likely due to the effect of benzodiazepine and is a benign EEG pattern. No seizures or epileptiform discharges were seen throughout the recording. Brad Graham    Medications:   . amLODipine  5 mg Oral Daily  . carvedilol  25 mg Oral BID WC  . insulin aspart  0-20 Units Subcutaneous TID AC & HS  . LORazepam  0-4 mg Intravenous Q6H   Followed by  . [START ON 03/09/2020] LORazepam  0-4 mg Intravenous Q12H  . phenytoin  300 mg Oral QHS   And  . [START ON 03/09/2020] phenytoin  200 mg Oral q AM  . tamsulosin  0.4 mg Oral QPC supper   Continuous Infusions: . sodium chloride Stopped (03/07/20 1516)  .  ceFAZolin (ANCEF) IV 2 g (03/08/20 1025)  . magnesium sulfate bolus IVPB       LOS: 6 days   Brad Graham  [facility]     03/08/2020, 12:38 PM

## 2020-03-08 NOTE — Progress Notes (Addendum)
[place] NOTE  Pharmacy Consult for Phenytoin  Indication: Seizure [facility])   Patient Measurements: Height: 6' (182.9 cm) Weight: 119.5 kg (263 lb 7.2 oz) IBW/kg (Calculated) : 77.6  Vital Signs: Temp: 98.4 F (36.9 C) (06/15 0800) Temp Source: Axillary (06/15 0800) BP: 176/72 (06/15 0821) Pulse Rate: 92 (06/15 0821) Intake/Output from previous day: 06/14 0701 - 06/15 0700 In: 821 [I.V.:468.1; IV Piggyback:352.9] Out: 2100 [Urine:2100] Intake/Output from this shift: Total I/O In: 360 [P.O.:360] Out: -   Labs: Recent Labs    03/07/20 0924 03/08/20 0509 03/09/20 0811  WBC  --  5.7  --   HGB  --  10.1*  --   HCT  --  29.9*  --   PLT  --  134*  --   CREATININE  --  1.04 1.24  MG 1.7 1.7 1.9  PHOS  --  2.7 3.6  ALBUMIN  --  2.7*  --   PROT  --  6.8  --   AST  --  39  --   ALT  --  13  --   ALKPHOS  --  79  --   BILITOT  --  0.8  --    Estimated Creatinine Clearance: 100.9 mL/min (by C-G formula based on SCr of 1.04 mg/dL).    Medical History: Past Medical History:  Diagnosis Date  . Alcohol abuse   . Arthritis   . H/O: CVA (cerebrovascular accident)   . Hyperlipidemia   . Hypertension   . Syncope 07/02/2016  . Syncope and collapse 01/27/2020    Medications:  Medications Prior to Admission  Medication Sig Dispense Refill Last Dose  . amLODipine (NORVASC) 10 MG tablet Take 1 tablet (10 mg total) by mouth daily. 30 tablet 0 unknown at unknown  . aspirin EC 81 MG tablet Take 1 tablet (81 mg total) by mouth daily. 30 tablet 0 unknown at unknown  . atorvastatin (LIPITOR) 20 MG tablet Take 1 tablet (20 mg total) by mouth daily at 6 PM. 30 tablet 0 unknown at unknown  . blood glucose meter kit and supplies KIT Dispense based on patient and insurance preference. Use up to four times daily as directed. (FOR ICD-9 250.00, 250.01). 1 each 0   . carvedilol (COREG) 25 MG tablet Take 1 tablet (25 mg total) by mouth 2 (two) times daily with a meal. 60  tablet 0 unknown at unknown  . folic acid (FOLVITE) 1 MG tablet Take 1 tablet (1 mg total) by mouth daily. 30 tablet 0 unknown at unknown  . hydrALAZINE (APRESOLINE) 25 MG tablet Take 1 tablet (25 mg total) by mouth every 6 (six) hours. 120 tablet 0 unknown at unknown  . metFORMIN (GLUCOPHAGE) 1000 MG tablet Take 1 tablet (1,000 mg total) by mouth daily with breakfast. 30 tablet 0 unknwon at [facility]  . Multiple Vitamin (MULTIVITAMIN WITH MINERALS) TABS tablet Take 1 tablet by mouth daily. 30 tablet 0 unknown at unknown  . tamsulosin (FLOMAX) 0.4 MG CAPS capsule Take 1 capsule (0.4 mg total) by mouth daily after supper. 30 capsule 0 unknown at unknown  . thiamine 100 MG tablet Take 1 tablet (100 mg total) by mouth daily. 30 tablet 0 unknown at unknown    Assessment: Pharmacy consulted to dose phenytoin in this 59 year old male with history of seizures, pt had grand mal seizure on 6/9 , now resolved.  He was not on any anti epileptics PTA. He was started with phenytoin 600 mg IV  Q8H X 3 doses (1800 mg loading dose) followed by phenytoin 250 mg IV Q12H. A second level was drawn 6/14 @ 1909, with a previous dose given at 1705.  Pharmacy now consulted to convert from IV to PO.   Phenytoin Level: 21.0 mcg/L Corrected Phenytoin Level: 24.9 mcg/L  Goal of Therapy:  Total phenytoin 10 - 20 mcg/L (trough)  Plan:   Phenytoin dose was given too close in proximity to drawn level to be considered a trough level  Discussed with Dr Irish Elders and will keep the same total daily dose of 590m, changing to 3057mER qpm and 20010mR qam, with anticipated drop of ~ 10% in IV to po conversion   Will follow free phenytoin level for further adjustment   (Addendum: Free phenytoin returned within therapeutic range - no further intervention required at this time - consider f/u level in 1 week as outpatient or sooner if s/sx of toxicity)  ChaLu DuffelharmD, BCPS Clinical Pharmacist 03/08/2020 11:51  AM

## 2020-03-08 NOTE — Consult Note (Signed)
PHARMACY CONSULT NOTE  Pharmacy Consult for Electrolyte Monitoring and Replacement   Recent Labs: Potassium (mmol/L)  Date Value  03/08/2020 4.0  05/02/2014 3.6   Magnesium (mg/dL)  Date Value  16/06/9603 1.7   Calcium (mg/dL)  Date Value  54/05/8118 8.1 (L)   Calcium, Total (mg/dL)  Date Value  14/78/2956 8.5   Albumin (g/dL)  Date Value  21/30/8657 2.7 (L)  02/16/2020 4.6  05/02/2014 3.8   Phosphorus (mg/dL)  Date Value  84/69/6295 2.7   Sodium (mmol/L)  Date Value  03/08/2020 133 (L)  02/16/2020 137  05/02/2014 126 (L)   Corrected Ca: 8.9 mg/dL  Assessment: 59 yo male admitted s/p PEA arrest following a grand mal seizure with acute renal failure with hyperkalemia, severe metabolic acidosis, and cardiogenic shock requiring mechanical intubation and vasopressorsnow extubated and transferred to 1C.    Goal of Therapy:  Potassium 4.0 - 5.1 mmol/L Magnesium 2.0 - 2.4 mg/dL All Other Electrolytes WNL  Plan:   Mg Sulfate 2g IV x 1  F/u electrolytes am 6/16 and replace as needed  Albina Billet ,PharmD Clinical Pharmacist 03/08/2020 12:21 PM

## 2020-03-08 NOTE — TOC Progression Note (Signed)
Transition of Care [facility]) - Progression Note    Patient Details  Name: Brad Graham MRN: [id number] Date of Birth: 1961/05/21  Transition of Care [facility]) CM/SW Contact  Krysta Bloomfield, Lemar Livings, LCSW Phone Number: 03/08/2020, 1:56 PM  Clinical Narrative: Spoke with [name] via telephone to discuss discharge plan when medically stable. Discussed if someone or family member could assist him once he was discharged from CIR. He reports there is no family who can assist him and he lives alone in a boarding house. Discussed to be able to go to CIR he will need a discharge disposition to be eligible to go to CIR. He reports his sister's are looking for a place for him to go close to them. [facility] but would need to find a Medicaid bed due to this is pt's insurance. Will call [name] to discuss plan.         Expected Discharge Plan and Services                                                 Social Determinants of Health (SDOH) Interventions    Readmission Risk Interventions No flowsheet data found.

## 2020-03-08 NOTE — Progress Notes (Signed)
Subjective: Much more awake, follows commands  Past Medical History:  Diagnosis Date  . Alcohol abuse   . Arthritis   . H/O: CVA (cerebrovascular accident)   . Hyperlipidemia   . Hypertension   . Syncope 07/02/2016  . Syncope and collapse 01/27/2020    Past Surgical History:  Procedure Laterality Date  . none      Family History  Problem Relation Age of Onset  . Diabetes Mother   . Stroke Mother   . Heart disease Maternal Grandmother   . Heart disease Maternal Grandfather     Social History:  reports that he has been smoking cigarettes. He has been smoking about 0.50 packs per day. He has never used smokeless tobacco. He reports current alcohol use. He reports that he does not use drugs.  No Known Allergies  Medications: I have reviewed the patient's current medications.  ROS: Unable to obtain as somnolent   Physical Examination: Blood pressure (!) 176/72, pulse 92, temperature 98.4 F (36.9 C), temperature source Axillary, resp. rate 18, height 6' (1.829 m), weight 119.5 kg, SpO2 98 %.     Neurological Examination   Mental Status: Alert, oriented, thought content appropriate. \ Cranial Nerves: II: Discs flat bilaterally; Visual fields grossly normal, pupils equal, round, reactive to light and accommodation III,IV, VI: ptosis not present, extra-ocular motions intact bilaterally V,VII: smile symmetric, facial light touch sensation normal bilaterally VIII: hearing normal bilaterally XI: bilateral shoulder shrug XII: midline tongue extension Motor: Generalized weakness more so in LE Sensory: Pinprick and light touch intact throughout, bilaterally Deep Tendon Reflexes: 1+ and symmetric throughout Plantars: Right: downgoing   Left: downgoing Cerebellar: normal finger-to-nose       Laboratory Studies:   Basic Metabolic Panel: Recent Labs  Lab 03/02/20 2311 03/02/20 2311 03/03/20 0547 03/03/20 1353 03/04/20 0438 03/04/20 0438 03/05/20 0418  03/05/20 0418 03/06/20 0417 03/07/20 0517 03/07/20 0924 03/08/20 0509  NA 139   < > 140   < > 140  --  145  --  140 136  --  133*  K 6.9*   < > 4.7   < > 3.7  --  3.4*  --  3.6 3.9  --  4.0  CL 106   < > 100   < > 98  --  101  --  100 98  --  98  CO2 15*   < > 15*   < > 30  --  32  --  27 26  --  25  GLUCOSE 225*   < > 250*   < > 322*  --  115*  --  121* 112*  --  153*  BUN 57*   < > 57*   < > 44*  --  29*  --  19 16  --  15  CREATININE 3.62*   < > 3.57*   < > 2.21*  --  1.50*  --  1.36* 1.10  --  1.04  CALCIUM 7.2*   < > 7.0*   < > 6.3*   < > 7.2*   < > 7.4* 7.9*  --  8.1*  MG 1.8   < > 2.4  --  1.6*  --   --   --  1.7  --  1.7 1.7  PHOS 9.4*  --  7.1*  --  3.3  --   --   --   --  2.1*  --  2.7   < > = values in this  interval not displayed.    Liver Function Tests: Recent Labs  Lab 03/02/20 2001 03/03/20 0547 03/04/20 0438 03/07/20 0517 03/08/20 0509  AST 38 33 18  --  39  ALT 26 24 18   --  13  ALKPHOS 79 75 56  --  79  BILITOT 1.7* 1.8* 0.6  --  0.8  PROT 7.1 6.4* 5.6*  --  6.8  ALBUMIN 3.6 3.2* 2.6* 2.8* 2.7*   No results for input(s): LIPASE, AMYLASE in the last 168 hours. No results for input(s): AMMONIA in the last 168 hours.  CBC: Recent Labs  Lab 03/02/20 2001 03/02/20 2001 03/03/20 0547 03/04/20 0438 03/05/20 0418 03/07/20 0517 03/08/20 0509  WBC 8.0   < > 11.4* 6.0 7.4 7.3 5.7  NEUTROABS 5.0  --   --   --   --  4.9 3.2  HGB 9.9*   < > 10.1* 9.4* 9.3* 10.0* 10.1*  HCT 32.6*   < > 31.1* 26.6* 28.8* 31.1* 29.9*  MCV 98.2   < > 90.9 83.9 90.3 90.9 86.4  PLT 171   < > 143* 123* 103* 125* 134*   < > = values in this interval not displayed.    Cardiac Enzymes: Recent Labs  Lab 03/03/20 0547  CKTOTAL 119    BNP: Invalid input(s): POCBNP  CBG: Recent Labs  Lab 03/07/20 1158 03/07/20 1627 03/07/20 2040 03/08/20 0429 03/08/20 0759  GLUCAP 131* 155* 109* 152* 154*    Microbiology: Results for orders placed or performed during the hospital  encounter of 03/02/20  Blood Culture (routine x 2)     Status: Abnormal (Preliminary result)   Collection Time: 03/02/20  8:01 PM   Specimen: BLOOD  Result Value Ref Range Status   Specimen Description   Final    BLOOD LEFT ANTECUBITAL Performed at [facility], [address] [place], [place] [address]    Special Requests   Final    BOTTLES DRAWN AEROBIC AND ANAEROBIC Blood Culture adequate volume Performed at [facility], [address] [place], [place] [address]    Culture  Setup Time   Final    GRAM POSITIVE COCCI ANAEROBIC BOTTLE ONLY CRITICAL RESULT CALLED TO, READ BACK BY AND VERIFIED WITH: WALID NAZARI AT 1958 03/03/20 BY ACR.PMF AEROBIC BOTTLE ONLY GRAM POSITIVE RODS CRITICAL RESULT CALLED TO, READ BACK BY AND VERIFIED WITH: SCOTT HALL ON 03/04/20 AT [address] Performed at [facility], [address] [place], [place] [address]    Culture (A)  Final    STAPHYLOCOCCUS SPECIES (COAGULASE NEGATIVE) THE SIGNIFICANCE OF ISOLATING THIS ORGANISM FROM A SINGLE SET OF BLOOD CULTURES WHEN MULTIPLE SETS ARE DRAWN IS UNCERTAIN. PLEASE NOTIFY THE MICROBIOLOGY DEPARTMENT WITHIN ONE WEEK IF SPECIATION AND SENSITIVITIES ARE REQUIRED. CULTURE REINCUBATED FOR BETTER GROWTH Performed at [facility], [address] [place], [place] [address]    Report Status PENDING  Incomplete  Blood Culture ID Panel (Reflexed)     Status: Abnormal   Collection Time: 03/02/20  8:01 PM  Result Value Ref Range Status   Enterococcus species NOT DETECTED NOT DETECTED Final   Listeria monocytogenes NOT DETECTED NOT DETECTED Final   Staphylococcus species DETECTED (A) NOT DETECTED Final    Comment: Methicillin (oxacillin) susceptible coagulase negative staphylococcus. Possible blood culture contaminant (unless isolated from more than one blood culture draw or clinical case suggests pathogenicity). No antibiotic treatment is indicated for blood  culture  contaminants. CRITICAL RESULT CALLED TO, READ BACK BY  AND VERIFIED WITH: Mila Merry 03/03/2020 @1958  BY ACR    Staphylococcus aureus (BCID) NOT DETECTED NOT DETECTED Final   Methicillin resistance NOT DETECTED NOT DETECTED Final   Streptococcus species NOT DETECTED NOT DETECTED Final   Streptococcus agalactiae NOT DETECTED NOT DETECTED Final   Streptococcus pneumoniae NOT DETECTED NOT DETECTED Final   Streptococcus pyogenes NOT DETECTED NOT DETECTED Final   Acinetobacter baumannii NOT DETECTED NOT DETECTED Final   Enterobacteriaceae species NOT DETECTED NOT DETECTED Final   Enterobacter cloacae complex NOT DETECTED NOT DETECTED Final   Escherichia coli NOT DETECTED NOT DETECTED Final   Klebsiella oxytoca NOT DETECTED NOT DETECTED Final   Klebsiella pneumoniae NOT DETECTED NOT DETECTED Final   Proteus species NOT DETECTED NOT DETECTED Final   Serratia marcescens NOT DETECTED NOT DETECTED Final   Haemophilus influenzae NOT DETECTED NOT DETECTED Final   Neisseria meningitidis NOT DETECTED NOT DETECTED Final   Pseudomonas aeruginosa NOT DETECTED NOT DETECTED Final   Candida albicans NOT DETECTED NOT DETECTED Final   Candida glabrata NOT DETECTED NOT DETECTED Final   Candida krusei NOT DETECTED NOT DETECTED Final   Candida parapsilosis NOT DETECTED NOT DETECTED Final   Candida tropicalis NOT DETECTED NOT DETECTED Final    Comment: Performed at [facility], [address] [place], [place]  Blood Culture (routine x 2)     Status: None   Collection Time: 03/02/20  8:06 PM   Specimen: BLOOD  Result Value Ref Range Status   Specimen Description BLOOD  Final   Special Requests BOTTLES DRAWN AEROBIC AND ANAEROBIC  Final   Culture   Final    NO GROWTH 5 DAYS Performed at [facility], [address] [place], [place]    Report Status 03/07/2020 FINAL  Final  SARS Coronavirus 2 by RT PCR (hospital order, performed in [facility] hospital lab)  Nasopharyngeal Nasopharyngeal Swab     Status: None   Collection Time: 03/02/20  8:55 PM   Specimen: Nasopharyngeal Swab  Result Value Ref Range Status   SARS Coronavirus 2 NEGATIVE NEGATIVE Final    Comment: (NOTE) SARS-CoV-2 target nucleic acids are NOT DETECTED. The SARS-CoV-2 RNA is generally detectable in upper and lower respiratory specimens during the acute phase of infection. The lowest concentration of SARS-CoV-2 viral copies this assay can detect is 250 copies / mL. A negative result does not preclude SARS-CoV-2 infection and should not be used as the sole basis for treatment or other patient management decisions.  A negative result may occur with improper specimen collection / handling, submission of specimen other than nasopharyngeal swab, presence of viral mutation(s) within the areas targeted by this assay, and inadequate number of viral copies (<250 copies / mL). A negative result must be combined with clinical observations, patient history, and epidemiological information. Fact Sheet for Patients:   05/02/20 Fact Sheet for Healthcare Providers: BoilerBrush.com.cy This test is not yet approved or cleared  by the https://pope.com/ FDA and has been authorized for detection and/or diagnosis of SARS-CoV-2 by FDA under an Emergency Use Authorization (EUA).  This EUA will remain in effect (meaning this test can be used) for the duration of the COVID-19 declaration under Section 564(b)(1) of the Act, 21 U.S.C. section 360bbb-3(b)(1), unless the authorization is terminated or revoked sooner. Performed at [facility], [address] [place], [place]   MRSA PCR Screening     Status: None   Collection Time: 03/02/20 10:30 PM   Specimen:  Nasopharyngeal  Result Value Ref Range Status   MRSA by PCR NEGATIVE NEGATIVE Final    Comment:        The GeneXpert MRSA Assay (FDA approved for NASAL  specimens only), is one component of a comprehensive MRSA colonization surveillance program. It is not intended to diagnose MRSA infection nor to guide or monitor treatment for MRSA infections. Performed at [facility], [address], [place], [place] [address]   Urine culture     Status: None   Collection Time: 03/03/20  4:40 AM   Specimen: In/Out Cath Urine  Result Value Ref Range Status   Specimen Description   Final    IN/OUT CATH URINE Performed at [facility], [address], [place], [place] [address]    Special Requests   Final    NONE Performed at [facility], [address] [place], [place] [address]    Culture   Final    NO GROWTH Performed at [facility], [address], [place], [place] [address]    Report Status 03/04/2020 FINAL  Final    Coagulation Studies: No results for input(s): LABPROT, INR in the last 72 hours.  Urinalysis:  Recent Labs  Lab 03/03/20 0440  COLORURINE AMBER*  LABSPEC 1.021  PHURINE 5.0  GLUCOSEU 50*  HGBUR MODERATE*  BILIRUBINUR NEGATIVE  KETONESUR 5*  PROTEINUR 100*  NITRITE NEGATIVE  LEUKOCYTESUR TRACE*    Lipid Panel:     Component Value Date/Time   CHOL 222 (H) 02/02/2020 1604   CHOL CANCELED 08/15/2015 1340   TRIG 174 (H) 02/02/2020 1604   TRIG CANCELED 08/15/2015 1340   HDL 109 02/02/2020 1604   VLDL 34 (H) 05/10/2015 1534   LDLCALC 85 02/02/2020 1604    HgbA1C:  Lab Results  Component Value Date   HGBA1C 7.2 (H) 01/27/2020    Urine Drug Screen:      Component Value Date/Time   LABOPIA NONE DETECTED 03/03/2020 0440   COCAINSCRNUR NONE DETECTED 03/03/2020 0440   LABBENZ NONE DETECTED 03/03/2020 0440   AMPHETMU NONE DETECTED 03/03/2020 0440   THCU NONE DETECTED 03/03/2020 0440   LABBARB NONE DETECTED 03/03/2020 0440    Alcohol Level:  Recent Labs  Lab 03/02/20 2027  ETH <10    Other results: EKG: normal EKG, normal sinus rhythm, unchanged from previous  tracings.  Imaging: EEG  Result Date: 03/07/2020 Charlsie Quest, MD     03/07/2020  2:54 PM Patient Name: Brad Graham MRN: [id number] Epilepsy Attending: Charlsie Quest Referring Physician/Provider: Dr. Dianah Field Date: 03/07/2020 Duration: 22.19 minutes Patient history: 59 year old male with history of syncopal episode prior stroke and seizure suspected due to alcohol withdrawal who presented with altered mental status and grand mal seizure.  EEG to evaluate for seizures. Level of alertness: Awake, asleep AEDs during EEG study: Phenytoin, Ativan Technical aspects: This EEG study was done with scalp electrodes positioned according to the 10-20 International system of electrode placement. Electrical activity was acquired at a sampling rate of 500Hz  and reviewed with a high frequency filter of 70Hz  and a low frequency filter of 1Hz . EEG data were recorded continuously and digitally stored. Description: During awake state, no clear posterior dominant was seen.  Sleep was characterized by vertex waves, sleep spindles (12 to 14 Hz), maximal frontocentral region.  EEG showed continuous generalized 5 to 7 Hz theta slowing with overriding 15 to 18 Hz beta activity with irregular morphology distributed symmetrically and diffusely.  Physiology photic  driving was not seen during photic stimulation.  Hyperventilation was not performed.   ABNORMALITY -Continued slow, generalized -Excessive beta, generalized IMPRESSION: This study is suggestive of moderate diffuse encephalopathy, nonspecific etiology. The excessive beta activity seen in the background is most likely due to the effect of benzodiazepine and is a benign EEG pattern. No seizures or epileptiform discharges were seen throughout the recording. Lora Havens     Assessment/Plan:  59 y.o. male PMH of Syncopal Episode, HTN, HLD, CVA,Recently Discharged from [facility] 01/28/2020 following treatment of unresponsivenesssecondary to seizuresctivity  suspecteddueto ETOH withdrawal (pt was not discharged on anticonvulsants and pt refused MRI Brain during hospitalization), Arthritis, and Severe ETOH Abuse. He presented to [facility] ER from a [place] via EMS on 06/9 with altered mental status. Per ER notes when EMSarrived at pts residence he was initially awake,but then began having agonal respirations followed by a grand mal seizure. Initial cardiac rhythm PEA, thereforeACLSprotocol initiatedwith ROSC following 2-3 minutes of CPR.  He was intubated and placed on mechanical ventilation and was admitted to the ICU for further management. Patient is s/p extubation. Suspected possible seizure activity today.  Pt was started on dilantin.  Suspected of possible seizure activity today after which he was agitated.  Mentation much improved and able to have full conversation - EEG no seizures done yesterday or epileptiform activity - passed PO diet - dilantin can be switched to PO, 1:1 PO to IV conversion  - call if any further questions.   03/08/2020, 11:26 AM

## 2020-03-08 NOTE — Progress Notes (Addendum)
Speech Language Pathology Treatment: Dysphagia  Patient Details Name: Brad Graham MRN: [id number] DOB: May 26, 1961 Today's Date: 03/08/2020 Time: 7564-3329 SLP Time Calculation (min) (ACUTE ONLY): 50 min  Assessment / Plan / Recommendation Clinical Impression  Pt seen today for ongoing assessment of swallowing and potential upgrade to oral diet. Pt has been drowsy but appears more alert today; verbally engaged w/ SLP and general questions re: self. Noted Baseline congested cough - Pt was orally intubated for airway protection 6/9-6/11. Pt required min verbal cues to ensure follow through w/ general precautions. Pt is on RA; wbc wnl.  Pt explained general aspiration precautions and agreed verbally to the need for following them especially sitting upright for all oral intake. Pt assisted w/ positioning d/t weakness then given trials of thin liquids, ice chips, purees and soft solids. NO overt clinical s/s of aspiration were noted w/ any consistency; respiratory status remained calm and unlabored, vocal quality clear b/t trials; O2 sats 98%. Pt held Cup when drinking following instructions for single, small sips slowly. NO straws were utiilized for better oral control. Oral phase appeared grossly [facility] for bolus management and timely A-P transfer for swallowing; oral clearing achieved w/ all consistencies. Noted min increased oral phase time w/ increased textured trials(solids though moistened) suspect d/t Missing Dentition, overall weakness. Belching+. Recommend upgrade to Dysphagia level 3 diet (mech soft) w/ gravies added to moisten foods; Thin liquids. Recommend general aspiration precautions; Pills Whole in Puree for cohesion and safer swallowing currently; tray setup and positioning assistance for meals w/ support as needed. REFLUX precautions recommended d/t ETOH abuse.  ST services will continue to f/u w/ pt for toleration of diet and education as needed while admitted. MD/NSG updated. Precautions  posted at bedside.   HPI HPI: Pt is a 59 yo male with a PMH of Syncopal Episode, HTN, HLD, CVA, Recently Discharged from [facility] 01/28/2020 following treatment of unresponsiveness secondary to seizure activity suspected due to ETOH withdrawal (pt was not discharged on anticonvulsants and pt refused MRI Brain during hospitalization), Arthritis, and Severe ETOH Abuse.  He presented to [facility] ER from a [place] via EMS on 06/9 with altered mental status.  Per ER notes when EMS arrived at pts residence he was initially awake, but then began having agonal respirations followed by a grand mal seizure.  Initial cardiac rhythm PEA, therefore ACLS protocol initiated with ROSC following 2-3 minutes of CPR.  Upon arrival to the ER pt noted to have agonal respirations requiring mechanical intubation.  Per ER physician following intubation pt began opening his eyes and purposeful movement present (attempted to follow commands). CXR and COVID-19 negative.  Pt was orally intubated for airway protection 6/9-6/11.      SLP Plan  Continue with current plan of care (po check)       Recommendations  Diet recommendations: Dysphagia 3 (mechanical soft);Thin liquid Liquids provided via: Cup;No straw Medication Administration: Whole meds with puree (for safer swallowing) Supervision: Patient able to self feed;Staff to assist with self feeding;Intermittent supervision to cue for compensatory strategies Compensations: Minimize environmental distractions;Slow rate;Small sips/bites;Lingual sweep for clearance of pocketing;Follow solids with liquid Postural Changes and/or Swallow Maneuvers: Seated upright 90 degrees;Upright 30-60 min after meal (Reflux precautions)                General recommendations:  (Dietician f/u) Oral Care Recommendations: Oral care BID;Oral care before and after PO;Staff/trained caregiver to provide oral care (pt support) Follow up Recommendations: Skilled Nursing facility (TBD) SLP Visit  Diagnosis: Dysphagia, unspecified (R13.10) Plan: Continue with current plan of care (po check)       GO               Brad Som, MS, CCC-SLP Jenasis Straley 03/08/2020, 11:54 AM

## 2020-03-09 LAB — GLUCOSE, CAPILLARY
Glucose-Capillary: 107 mg/dL — ABNORMAL HIGH (ref 70–99)
Glucose-Capillary: 162 mg/dL — ABNORMAL HIGH (ref 70–99)
Glucose-Capillary: 144 mg/dL — ABNORMAL HIGH (ref 70–99)
Glucose-Capillary: 168 mg/dL — ABNORMAL HIGH (ref 70–99)

## 2020-03-09 LAB — PHOSPHORUS: Phosphorus: 3.6 mg/dL (ref 2.5–4.6)

## 2020-03-09 LAB — BASIC METABOLIC PANEL
Anion gap: 10 (ref 5–15)
BUN: 17 mg/dL (ref 6–20)
CO2: 24 mmol/L (ref 22–32)
Calcium: 8.2 mg/dL — ABNORMAL LOW (ref 8.9–10.3)
Chloride: 96 mmol/L — ABNORMAL LOW (ref 98–111)
Creatinine, Ser: 1.24 mg/dL (ref 0.61–1.24)
GFR calc Af Amer: >60 mL/min (ref >60)
GFR calc non Af Amer: >60 mL/min (ref >60)
Glucose, Bld: 123 mg/dL — ABNORMAL HIGH (ref 70–99)
Potassium: 4 mmol/L (ref 3.5–5.1)
Sodium: 130 mmol/L — ABNORMAL LOW (ref 135–145)

## 2020-03-09 LAB — MAGNESIUM: Magnesium: 1.9 mg/dL (ref 1.7–2.4)

## 2020-03-09 LAB — PHENYTOIN LEVEL, FREE AND TOTAL
Phenytoin, Free: 1.9 ug/mL (ref 1.0–2.0)
Phenytoin, Total: 18.6 ug/mL (ref 10.0–20.0)

## 2020-03-09 MED ORDER — ASPIRIN EC 81 MG PO TBEC
81.0000 mg | DELAYED_RELEASE_TABLET | Freq: Every day | ORAL | Status: DC
  Administered 2020-03-09 – 2020-03-14 (×6): 81 mg via ORAL
  Filled 2020-03-09 (×6): qty 1

## 2020-03-09 MED ORDER — ATORVASTATIN CALCIUM 20 MG PO TABS
20.0000 mg | ORAL_TABLET | Freq: Every day | ORAL | Status: DC
  Administered 2020-03-09 – 2020-03-14 (×5): 20 mg via ORAL
  Filled 2020-03-09 (×5): qty 1

## 2020-03-09 NOTE — [place] FL2 (Signed)
MEDICAID FL2 LEVEL OF CARE SCREENING TOOL     IDENTIFICATION  Patient Name: Brad Graham Birthdate: 1961/09/08 Sex: male Admission Date (Current Location): 03/02/2020  [place] and [place] Number:  [address] [id number] [facility] Facility and Address:  [facility], [address] [place], [place] [address]      Provider Number: [id number]  Attending Physician Name and Address:  Enedina Finner, MD  Relative Name and Phone Number:  Kaliq Lege  [phone]    Current Level of Care: Hospital Recommended Level of Care: Skilled Nursing Facility Prior Approval Number:    Date Approved/Denied:   PASRR Number: [id number]  Discharge Plan: SNF    Current Diagnoses: Patient Active Problem List   Diagnosis Date Noted  . Hyperkalemia 03/03/2020  . Metabolic acidosis 03/03/2020  . Acute encephalopathy 03/03/2020  . Cardiogenic shock (HCC) 03/03/2020  . Hyperglycemia 03/03/2020  . Acute renal failure (HCC)   . Goals of care, counseling/discussion   . Palliative care by specialist   . Cardiopulmonary arrest (HCC) 03/02/2020  . Unresponsive episode 01/28/2020  . Seizure (HCC) 08/02/2016  . Essential hypertension 05/10/2015  . Diabetes mellitus without complication (HCC) 05/10/2015  . Hyperlipidemia 05/10/2015  . Proteinuria 05/10/2015    Orientation RESPIRATION BLADDER Height & Weight     Self, Situation, Place  Normal External catheter Weight: 263 lb 7.2 oz (119.5 kg) Height:  6' (182.9 cm)  BEHAVIORAL SYMPTOMS/MOOD NEUROLOGICAL BOWEL NUTRITION STATUS    Convulsions/Seizures (had upon admission to the hospital) Continent Diet (Dys 3 thin liquids)  AMBULATORY STATUS COMMUNICATION OF NEEDS Skin   Limited Assist Verbally Normal                       Personal Care Assistance Level of Assistance  Bathing, Dressing Bathing Assistance: Limited assistance   Dressing Assistance: Limited assistance     Functional Limitations Info   Speech     Speech Info: Impaired    SPECIAL CARE FACTORS FREQUENCY  Speech therapy     PT Frequency: 5x week OT Frequency: 3-5 x week     Speech Therapy Frequency: 2-3 x per week      Contractures Contractures Info: Not present    Additional Factors Info  Code Status, Allergies Code Status Info: DNR Allergies Info: NKDA           Current Medications (03/09/2020):  This is the current hospital active medication list Current Facility-Administered Medications  Medication Dose Route Frequency Provider Last Rate Last Admin  . 0.9 %  sodium chloride infusion  250 mL Intravenous Continuous Eugenie Norrie, NP   Stopped at 03/07/20 1516  . amLODipine (NORVASC) tablet 5 mg  5 mg Oral Daily Lurene Shadow, MD   5 mg at 03/09/20 0826  . amoxicillin-clavulanate (AUGMENTIN) 875-125 MG per tablet 1 tablet  1 tablet Oral Q12H Enedina Finner, MD   1 tablet at 03/09/20 0826  . aspirin EC tablet 81 mg  81 mg Oral Daily Lurene Shadow, MD   81 mg at 03/09/20 0825  . atorvastatin (LIPITOR) tablet 20 mg  20 mg Oral Daily Lurene Shadow, MD   20 mg at 03/09/20 0826  . carvedilol (COREG) tablet 25 mg  25 mg Oral BID WC Lurene Shadow, MD   25 mg at 03/09/20 0826  . hydrALAZINE (APRESOLINE) injection 10 mg  10 mg Intravenous Q6H PRN Lurene Shadow, MD      . insulin aspart (novoLOG) injection 0-20 Units  0-20 Units Subcutaneous  TID AC & HS Jennye Boroughs, MD   4 Units at 03/09/20 1240  . LORazepam (ATIVAN) injection 0-4 mg  0-4 mg Intravenous Q6H Jennye Boroughs, MD       Followed by  . LORazepam (ATIVAN) injection 0-4 mg  0-4 mg Intravenous Q12H Jennye Boroughs, MD      . LORazepam (ATIVAN) tablet 1-4 mg  1-4 mg Oral Q1H PRN Jennye Boroughs, MD       Or  . LORazepam (ATIVAN) injection 1-4 mg  1-4 mg Intravenous Q1H PRN Jennye Boroughs, MD      . LORazepam (ATIVAN) injection 2-4 mg  2-4 mg Intravenous Q1H PRN Awilda Bill, NP   2 mg at 03/07/20 1017  . ondansetron (ZOFRAN) injection 4 mg  4 mg  Intravenous Q6H PRN Awilda Bill, NP      . phenytoin (DILANTIN) ER capsule 300 mg  300 mg Oral QHS Leotis Pain, MD   300 mg at 03/08/20 2218   And  . phenytoin (DILANTIN) ER capsule 200 mg  200 mg Oral q AM Leotis Pain, MD   200 mg at 03/09/20 0923  . tamsulosin (FLOMAX) capsule 0.4 mg  0.4 mg Oral QPC supper Jennye Boroughs, MD   0.4 mg at 03/08/20 1707     Discharge Medications: Please see discharge summary for a list of discharge medications.  Relevant Imaging Results:  Relevant Lab Results:   Additional Information SSN: [id number]  Kathline Banbury, Gardiner Rhyme, LCSW

## 2020-03-09 NOTE — Consult Note (Addendum)
PHARMACY CONSULT NOTE  Pharmacy Consult for Electrolyte Monitoring and Replacement   Recent Labs: Potassium (mmol/L)  Date Value  03/09/2020 4.0  05/02/2014 3.6   Magnesium (mg/dL)  Date Value  33/54/5625 1.9   Calcium (mg/dL)  Date Value  63/89/3734 8.2 (L)   Calcium, Total (mg/dL)  Date Value  28/76/8115 8.5   Albumin (g/dL)  Date Value  72/62/0355 2.7 (L)  02/16/2020 4.6  05/02/2014 3.8   Phosphorus (mg/dL)  Date Value  97/41/6384 3.6   Sodium (mmol/L)  Date Value  03/09/2020 130 (L)  02/16/2020 137  05/02/2014 126 (L)   Corrected Ca: 9.0 mg/dL  Assessment: 59 yo male admitted s/p PEA arrest following a grand mal seizure with acute renal failure with hyperkalemia, severe metabolic acidosis, and cardiogenic shock requiring mechanical intubation and vasopressorsnow extubated and transferred to 1C.    Goal of Therapy:  Potassium 4.0 - 5.1 mmol/L Magnesium 2.0 - 2.4 mg/dL All Other Electrolytes WNL  Plan:   F/u electrolytes am 6/18 and replace as needed  No replenishment warranted today  Albina Billet ,PharmD Clinical Pharmacist 03/09/2020 9:17 AM

## 2020-03-09 NOTE — Progress Notes (Signed)
[facility]  - [place] at [facility]   PATIENT NAME: Brad Graham    MR#:  [id number]  DATE OF BIRTH:  1961/06/04  SUBJECTIVE:   Patient requesting to discharge. Per RN he is to person assist. He has some slurring speech which appears chronic. Tolerated PO diet. REVIEW OF SYSTEMS:   Review of Systems  Constitutional: Negative for chills, fever and weight loss.  HENT: Negative for ear discharge, ear pain and nosebleeds.   Eyes: Negative for blurred vision, pain and discharge.  Respiratory: Negative for sputum production, shortness of breath, wheezing and stridor.   Cardiovascular: Negative for chest pain, palpitations, orthopnea and PND.  Gastrointestinal: Negative for abdominal pain, diarrhea, nausea and vomiting.  Genitourinary: Negative for frequency and urgency.  Musculoskeletal: Negative for back pain and joint pain.  Neurological: Negative for sensory change, speech change, focal weakness and weakness.  Psychiatric/Behavioral: Negative for depression and hallucinations. The patient is not nervous/anxious.    Tolerating Diet:YES Tolerating PT: REC CIR  DRUG ALLERGIES:  No Known Allergies  VITALS:  Blood pressure (!) 154/99, pulse 93, temperature 97.8 F (36.6 C), temperature source Oral, resp. rate 17, height 6' (1.829 m), weight 119.5 kg, SpO2 98 %.  PHYSICAL EXAMINATION:   Physical Exam  GENERAL:  59 y.o.-year-old patient lying in the bed with no acute distress.  EYES: Pupils equal, round, reactive to light and accommodation. No scleral icterus.   HEENT: Head atraumatic, normocephalic. Oropharynx and nasopharynx clear.  NECK:  Supple, no jugular venous distention. No thyroid enlargement, no tenderness.  LUNGS: Normal breath sounds bilaterally, no wheezing, rales, rhonchi. No use of accessory muscles of respiration.  CARDIOVASCULAR: S1, S2 normal. No murmurs, rubs, or gallops.  ABDOMEN: Soft, nontender, nondistended. Bowel sounds present. No  organomegaly or mass.  EXTREMITIES: No cyanosis, clubbing or edema b/l.    NEUROLOGIC: Cranial nerves II through XII are intact. No focal Motor or sensory deficits b/l.  Slurred speech, weak PSYCHIATRIC:  patient is alert and oriented x 2.  SKIN: No obvious rash, lesion, or ulcer.   LABORATORY PANEL:  CBC Recent Labs  Lab 03/08/20 0509  WBC 5.7  HGB 10.1*  HCT 29.9*  PLT 134*    Chemistries  Recent Labs  Lab 03/08/20 0509 03/08/20 0509 03/09/20 0811  NA 133*   < > 130*  K 4.0   < > 4.0  CL 98   < > 96*  CO2 25   < > 24  GLUCOSE 153*   < > 123*  BUN 15   < > 17  CREATININE 1.04   < > 1.24  CALCIUM 8.1*   < > 8.2*  MG 1.7   < > 1.9  AST 39  --   --   ALT 13  --   --   ALKPHOS 79  --   --   BILITOT 0.8  --   --    < > = values in this interval not displayed.   Cardiac Enzymes No results for input(s): TROPONINI in the last 168 hours. RADIOLOGY:  No results found. ASSESSMENT AND PLAN:  SHIVANSH HARDAWAY is a 59 y.o. male with a PMH of Syncopal Episode, HTN, HLD, CVA,Recently Discharged from [facility] 01/28/2020 following treatment of unresponsivenesssecondary to seizuresctivity suspecteddueto ETOH withdrawal (pt was not discharged on anticonvulsants and pt refused MRI Brain during hospitalization), Arthritis, and Severe ETOH Abuse. He presented to [facility] ER from a Group Home via EMS on 06/9 with altered mental status  *  Acute hypoxemic and hypercapnic respiratory failure, s/p PEA cardiac arrest, aspiration pneumonia: -S/p extubation on 03/04/2020 -hemodynamically stable  *Seizure disorder -EEG did not show any evidence of seizures so epileptiform discharges but it was suggestive of moderate diffuse encephalopathy. -  Continue po Dilantin.  Follow-up with neurologist  *Aspiration pneumonia, coagulase-negative staph bacteremia in 1 bottle:  -now on oral Augmentin.  -CONS in 1 blood culture bottle is a contaminant per ID.    -Speech therapist recommended mechanical soft  diet for dysphagia  *AKI complicated by metabolic acidosis and hyperkalemia: Resolved.  Discontinue IV fluids   *Acute metabolic encephalopathy: Continue supportive care  *Type 2 diabetes mellitus: NovoLog as needed for hyperglycemia  *Thrombocytopenia: Platelet count is improving continue to monitor.  *Probable septic versus cardiogenic shock: Resolved  *Alcohol use disorder: Ativan as needed for withdrawal syndrome  *Body mass index is 35.73 kg/m.  (Morbid obesity)    DVT prophylaxis: SCD Code Status: DNR Family Communication:  patient's sister, Hoyle Sauer IS AWARE  Disposition Plan: tbd   Status is: Inpatient  Remains inpatient appropriate because:Unsafe d/c plan   Dispo: The patient is from: Group home  Anticipated d/c is to: CIR OR snf  Anticipated d/c date is: 1 day  Patient currently is medically stable to d/c.   TOTAL TIME TAKING CARE OF THIS PATIENT: 30 minutes.  >50% time spent on counselling and coordination of care  Note: This dictation was prepared with Dragon dictation along with smaller phrase technology. Any transcriptional errors that result from this process are unintentional.  Fritzi Mandes M.D    [facility]   CC: Primary care physician; Carnella Guadalajara I, NPPatient ID: [name], male   DOB: 01/06/1961, 17 y.o.   MRN: [id number]

## 2020-03-09 NOTE — Progress Notes (Signed)
Speech Language Pathology Treatment: Dysphagia  Patient Details Name: Brad Graham MRN: [id number] DOB: 11-19-1960 Today's Date: 03/09/2020 Time: 1950-9326 SLP Time Calculation (min) (ACUTE ONLY): 45 min  Assessment / Plan / Recommendation Clinical Impression  Pt seen today for ongoing assessment of swallowing and potential upgrade to oral diet. Pt continues to present w/ more alertness per chart notes; he verbally engaged w/ SLP and answered general questions re: self this morning. He appeared fixated on the idea he was "leaving today" to go home and that he had "someone coming to pick me up" if we just called him to do so. Noted min Baseline congested cough - Pt was orally intubated for airway protection 6/9-6/11. Pt required min verbal cues to ensure follow through w/ general precautions, and positioning more midline in bed. Pt is on RA; wbc wnl.  Pt re-explained general aspiration precautions which he implemented as he fed himself a few more bites/sips of his breakfast meal on the tray. He consumed trials of thin liquids Via Cup and soft solids. No overt clinical s/s of aspiration were noted w/ any consistency; respiratory status remained calm and unlabored, vocal quality clear b/t trials. Pt held Cup when drinking following instructions for single, small sips slowly. NO straws were utiilized for better oral control while drinking. Oral phase appeared grossly [facility] for bolus management and timely A-P transfer for swallowing; oral clearing achieved w/ all consistencies. Noted min increased oral phase time w/ increased textured trials(solids though moistened) suspect d/t Missing Dentition, overall weakness. Belching+. Recommend continue a Dysphagia level 3 diet (mech soft w/ Cut meats) w/ gravies added to moisten foods; Thin liquids. Recommend general aspiration precautions; Pills Whole in Puree for cohesion and safer swallowing currently; tray setup and positioning assistance for meals w/ support as  needed. REFLUX precautions recommended d/t ETOH abuse. ST services can be available for any new needs while admitted. He appears close to/at his baseline re: swallowing. Precautions posted at bedside. NSG updated. Pt was given education on diet consistency and food options/preparation for ease of mastication d/t Missing Dentition; education on general aspiration and Reflux precautions including fully sitting up during/after meals -- in chair is best; small bites/sips Slowly. And, Reflux discussion. Handouts given on above education.  Unsure of pt's Baseline Cognitive status -- due to pt's history of Severe ETOH Abuse(as per chart notes), and recent possible Seizure activity, impact on cognitive-linguistic functioning is possible. Pt resides in a [place] w/ some assistance w/ higher cognitive functioning tasks(bills, shopping). Recommend a formal cognitive-linguistic assessment post discharge(to a structured setting) if any new deficits noted from Baseline in order to address his functional ADLs.      HPI HPI: Pt is a 59 yo male with a PMH of Syncopal Episode, HTN, HLD, CVA, Recently Discharged from [facility] 01/28/2020 following treatment of unresponsiveness secondary to seizure activity suspected due to ETOH withdrawal (pt was not discharged on anticonvulsants and pt refused MRI Brain during hospitalization), Arthritis, and Severe ETOH Abuse.  He presented to [facility] ER from a [place] via EMS on 06/9 with altered mental status.  Per ER notes when EMS arrived at pts residence he was initially awake, but then began having agonal respirations followed by a grand mal seizure.  Initial cardiac rhythm PEA, therefore ACLS protocol initiated with ROSC following 2-3 minutes of CPR.  Upon arrival to the ER pt noted to have agonal respirations requiring mechanical intubation.  Per ER physician following intubation pt began opening his eyes and purposeful  movement present (attempted to follow commands). CXR and COVID-19  negative.  Pt was orally intubated for airway protection 6/9-6/11.      SLP Plan  All goals met       Recommendations  Diet recommendations: Dysphagia 3 (mechanical soft);Thin liquid Liquids provided via: Cup;No straw Medication Administration: Whole meds with puree (for safer swallowing overall) Supervision: Patient able to self feed;Staff to assist with self feeding;Intermittent supervision to cue for compensatory strategies (setup) Compensations: Minimize environmental distractions;Slow rate;Small sips/bites;Lingual sweep for clearance of pocketing;Follow solids with liquid Postural Changes and/or Swallow Maneuvers: Seated upright 90 degrees;Upright 30-60 min after meal (Reflux precautions)                General recommendations:  (Dietician support) Oral Care Recommendations: Oral care BID;Oral care before and after PO;Staff/trained caregiver to provide oral care (pt support) Follow up Recommendations:  (TBD) SLP Visit Diagnosis: Dysphagia, unspecified (R13.10) Plan: All goals met       [place], MS, CCC-SLP Donalda Job 03/09/2020, 11:20 AM

## 2020-03-09 NOTE — TOC Progression Note (Addendum)
Transition of Care [facility]) - Progression Note    Patient Details  Name: SUE MCALEXANDER MRN: [id number] Date of Birth: 1961/04/07  [facility] CM/SW Contact  Jadyn Barge, Lemar Livings, LCSW Phone Number: 03/09/2020, 1:12 PM  Clinical Narrative:   Spoke with Carolyn-sister who reports her other sister Juliette Alcide is looking into a place for him. Have left a message for Juliette Alcide and will work on plan. CIR will probably not take if he has no discharge plan when ready for DC from there. Will discuss with her when she calls back, pt will also need to sign his check over if he goes to a SNF.   2:15 PM Spoke with Juliette Alcide to discuss plan and what facility she is looking into. She will talk with pt about this and is aware he will need assist at DC       Expected Discharge Plan and Services                                                 Social Determinants of Health (SDOH) Interventions    Readmission Risk Interventions No flowsheet data found.

## 2020-03-10 LAB — GLUCOSE, CAPILLARY
Glucose-Capillary: 130 mg/dL — ABNORMAL HIGH (ref 70–99)
Glucose-Capillary: 192 mg/dL — ABNORMAL HIGH (ref 70–99)
Glucose-Capillary: 195 mg/dL — ABNORMAL HIGH (ref 70–99)
Glucose-Capillary: 150 mg/dL — ABNORMAL HIGH (ref 70–99)

## 2020-03-10 MED ORDER — ACETAMINOPHEN 325 MG PO TABS
650.0000 mg | ORAL_TABLET | Freq: Four times a day (QID) | ORAL | Status: DC | PRN
  Administered 2020-03-10: 18:00:00 650 mg via ORAL
  Filled 2020-03-10: qty 2

## 2020-03-10 NOTE — Progress Notes (Signed)
Pt uncle larry called and notified that patient was moved from 122 to 116. Peyton Najjar stated that he would let the other family members knows.

## 2020-03-10 NOTE — Consult Note (Signed)
WOC Nurse Consult Note: Reason for Consult:Moisture associated skin damage to gluteal fold.   Wound type:MASD Pressure Injury POA: NA Measurement: 2 cm linear breakdown to apex of gluteal cleft Wound BBU:YZJQ and moist Drainage (amount, consistency, odor) scant weeping Periwound:intact  Frequently moist Dressing procedure/placement/frequency: Barrier cream twice daily and PRN soilage  Will not follow at this time.  Please re-consult if needed.  Maple Hudson MSN, RN, FNP-BC CWON Wound, Ostomy, Continence Nurse Pager [phone]

## 2020-03-10 NOTE — Progress Notes (Signed)
Physical Therapy Treatment Patient Details Name: Brad Graham MRN: [id number] DOB: 13-Jul-1961 Today's Date: 03/10/2020    History of Present Illness admitted for acute hospitalization s/p PEA arrest after grand mal seizures, cardiogenic shock (requiring CPR x2-3 minutes), requiring intubation for airway protection 6/9-6/11.    PT Comments    Pt was long sitting in bed upon arriving. He is alert and oriented x 3. Agrees to PT sessio and is cooperative throughout. Pt's supportive sister arrived during session. He required max assist to exit L side of bed and max assist to stand 2 x EOB. Poor sitting balance and unable to stand completely erect 2/2 L side weakness. Stood ~ 20 sec each trial but pt fatigued quickly. Once returned to supine in bed with HOB elevated, he performed strengthening and coordination exercises to promote L side improvements. Overall tolerated session well with vitals stable throughout however will need extensive rehab to return to PLOF. Pt was supine in bed with HOB elevated, call bell in reach, bed alarm in place and RN aware of pt's abilities.      Follow Up Recommendations  CIR;SNF     Equipment Recommendations  Rolling walker with 5" wheels;3in1 (PT)    Recommendations for Other Services       Precautions / Restrictions Precautions Precautions: Fall Restrictions Weight Bearing Restrictions: No    Mobility  Bed Mobility Overal bed mobility: Needs Assistance Bed Mobility: Supine to Sit;Sit to Supine     Supine to sit: Max assist;HOB elevated Sit to supine: Max assist   General bed mobility comments: Pt required max assist of one to progress from long sitting to short sit EOB. Max vcs and increased time required. Performed to L side of bed.  Transfers Overall transfer level: Needs assistance Equipment used: Rolling walker (2 wheeled) Transfers: Sit to/from Stand Sit to Stand: Max assist;From elevated surface         General transfer comment:  Pt was able to STS EOB 2 x with max vcs. Pt unable to fully extend LLE 2/2 to previous history of CVA with L side deficits.  Ambulation/Gait             General Gait Details: unsafe/unable to progress   Stairs             Wheelchair Mobility    Modified Rankin (Stroke Patients Only)       Balance Overall balance assessment: Needs assistance Sitting-balance support: Bilateral upper extremity supported;Feet supported Sitting balance-Leahy Scale: Fair Sitting balance - Comments: Pt has posterior left lean but with vcs is able to correct for short period prior to reverting back. CGA at first progressed to close SBA.                                    Cognition Arousal/Alertness: Awake/alert Behavior During Therapy: WFL for tasks assessed/performed;Flat affect Overall Cognitive Status: Impaired/Different from baseline                                 General Comments: pt is alert throughout and oriented x 3. cooperative throughout but did fatigue quickly      Exercises Other Exercises Other Exercises: pt performed ther ex in bed to promote LLE/LUE strengthening and improve coordination. Continues to have residual deficits from previous stroke    General Comments  Pertinent Vitals/Pain Pain Assessment: No/denies pain    Home Living                      Prior Function            PT Goals (current goals can now be found in the care plan section) Acute Rehab PT Goals Patient Stated Goal: to get better Progress towards PT goals: Progressing toward goals    Frequency    Min 2X/week      PT Plan Current plan remains appropriate    Co-evaluation     PT goals addressed during session: Mobility/safety with mobility;Balance        AM-PAC PT "6 Clicks" Mobility   Outcome Measure  Help needed turning from your back to your side while in a flat bed without using bedrails?: A Lot Help needed moving from  lying on your back to sitting on the side of a flat bed without using bedrails?: A Lot Help needed moving to and from a bed to a chair (including a wheelchair)?: A Lot Help needed standing up from a chair using your arms (e.g., wheelchair or bedside chair)?: A Lot Help needed to walk in hospital room?: Total Help needed climbing 3-5 steps with a railing? : Total 6 Click Score: 10    End of Session Equipment Utilized During Treatment: Gait belt Activity Tolerance: Patient tolerated treatment well;Patient limited by fatigue Patient left: in bed;with call bell/phone within reach;with bed alarm set Nurse Communication: Mobility status PT Visit Diagnosis: Difficulty in walking, not elsewhere classified (R26.2);Other symptoms and signs involving the nervous system (R29.898)     Time: 1110-1140 PT Time Calculation (min) (ACUTE ONLY): 30 min  Charges:  $Therapeutic Exercise: 8-22 mins $Therapeutic Activity: 8-22 mins                     Jetta Lout PTA 03/10/20, 11:56 AM

## 2020-03-10 NOTE — Progress Notes (Signed)
Occupational Therapy Treatment Patient Details Name: Brad Graham MRN: [id number] DOB: 1961/01/01 Today's Date: 03/10/2020    History of present illness admitted for acute hospitalization s/p PEA arrest after grand mal seizures, cardiogenic shock (requiring CPR x2-3 minutes), requiring intubation for airway protection 6/9-6/11.   OT comments  Brad Graham presents this morning agreeable to treatment and able to recall performance in earlier PT session. O2 sats remained 91-95% throughout. He required Max A x2 for overall bed mobility, heavy assist needed for LLE mgt. With multimodal cues, pt used BUE to assist with positioning and scooting. He sat at EOB ~12 min, becoming fatigued with increased time. Pt demonstrates posterior left lateral lean throughout EOB sitting, requiring consistent multimodal cues for posture correction (visual in mirror, tactile for specific hand placement) and CGA-close SBA for maintaining balance. He washed his face and applied lotion with Setup A and physical assist only for posture/balance as described. Therapist facilitated right side reaching inside/outside BOS at different levels for balance training; pt responded well. Max A x2 for lateral scoot along side of bed. Pt returned to bed with HOB elevated in preparation for lunch; instructed in use of call bell, with good immediate and delayed recall of information. Pt will continue to benefit from skilled acute OT services. Continue to recommend CIR upon discharge to maximize pt outcomes; if unable, recommend SNF.           Follow Up Recommendations  CIR;SNF    Equipment Recommendations  Other (comment) (TBD at next venue of care)    Recommendations for Other Services      Precautions / Restrictions Precautions Precautions: Fall Restrictions Weight Bearing Restrictions: No       Mobility Bed Mobility Overal bed mobility: Needs Assistance Bed Mobility: Supine to Sit;Sit to Supine     Supine to sit: Max  assist;HOB elevated;Mod assist;+2 for physical assistance Sit to supine: +2 for physical assistance;Max assist   General bed mobility comments: with heavy vc's for UE use, pt Mod-Max A x2 for sitting up to L side of bed, additional assist needed for LLE mgt during sit <> supine, Max A x2 for repositioning in bed  Transfers Overall transfer level: Needs assistance Equipment used: Rolling walker (2 wheeled) Transfers: Lateral/Scoot Transfers Sit to Stand: Max assist;From elevated surface        Lateral/Scoot Transfers: +2 physical assistance;From elevated surface;Max assist General transfer comment: Pt requires multimodal cues for positioning and body mechanics throughout, additional assist for LLE 2/2 previous h/o CVA    Balance Overall balance assessment: Needs assistance Sitting-balance support: Feet supported;Single extremity supported;Bilateral upper extremity supported;No upper extremity supported Sitting balance-Leahy Scale: Fair Sitting balance - Comments: pt has posterior left lean but with multimodal cues is able to correct for short period of time, alternating CGA and close Supervision throughout; verbal cues for hand placement to assist with balance Postural control: Left lateral lean;Posterior lean                                 ADL either performed or assessed with clinical judgement   ADL Overall ADL's : Needs assistance/impaired     Grooming: Wash/dry face;Sitting;Cueing for sequencing;Cueing for compensatory techniques;Cueing for safety;Minimal assistance Grooming Details (indicate cue type and reason): pt needed cues throughout and Min A primarily for correcting sitting posture/maintaining balance during EOB hygiene  General ADL Comments: pt on room air, O2 levels 91-95% throughout session     Vision   Additional Comments: some visual difficulties when using tv remote, assess further   Perception      Praxis      Cognition Arousal/Alertness: Awake/alert Behavior During Therapy: WFL for tasks assessed/performed;Flat affect Overall Cognitive Status: No family/caregiver present to determine baseline cognitive functioning                                 General Comments: pt alert to himself, followed simple commands with cues, Mod multimodal cues required for more complex commands        Exercises Exercises: Other exercises Other Exercises Other Exercises: Instructed pt in use of call bell, incorporating immediate and delayed recall during instructions Other Exercises: Therapist facilitated R side reach inside/outside BOS at different levels for balance training   Shoulder Instructions       General Comments      Pertinent Vitals/ Pain       Pain Assessment: Faces Faces Pain Scale: Hurts little more Pain Location: LLE Pain Descriptors / Indicators: Tightness;Discomfort;Grimacing Pain Intervention(s): Limited activity within patient's tolerance;Monitored during session;Repositioned  Home Living                                          Prior Functioning/Environment              Frequency  Min 2X/week        Progress Toward Goals  OT Goals(current goals can now be found in the care plan section)  Progress towards OT goals: Progressing toward goals  Acute Rehab OT Goals Patient Stated Goal: to get better OT Goal Formulation: With patient/family Time For Goal Achievement: 03/21/20 Potential to Achieve Goals: Good  Plan Frequency remains appropriate;Discharge plan remains appropriate    Co-evaluation        PT goals addressed during session: Mobility/safety with mobility;Balance        AM-PAC OT "6 Clicks" Daily Activity     Outcome Measure   Help from another person eating meals?: A Little Help from another person taking care of personal grooming?: A Little Help from another person toileting, which includes using  toliet, bedpan, or urinal?: A Lot Help from another person bathing (including washing, rinsing, drying)?: A Lot Help from another person to put on and taking off regular upper body clothing?: A Lot Help from another person to put on and taking off regular lower body clothing?: A Lot 6 Click Score: 14    End of Session    OT Visit Diagnosis: Unsteadiness on feet (R26.81);Other abnormalities of gait and mobility (R26.89);Muscle weakness (generalized) (M62.81)   Activity Tolerance Patient tolerated treatment well   Patient Left in bed;with call bell/phone within reach;with bed alarm set   Nurse Communication Mobility status        Time: 1610-9604 OT Time Calculation (min): 44 min  Charges: OT General Charges $OT Visit: 1 Visit OT Treatments $Self Care/Home Management : 23-37 mins $Therapeutic Activity: 8-22 mins   Maurilio Lovely, OTS 03/10/20, 1:48 PM

## 2020-03-10 NOTE — TOC Progression Note (Signed)
Transition of Care [facility]) - Progression Note    Patient Details  Name: Brad Graham MRN: [id number] Date of Birth: April 02, 1961  Transition of Care [facility] CM/SW Contact  Anshi Jalloh, Lemar Livings, LCSW Phone Number: 03/10/2020, 9:33 AM  Clinical Narrative: Expanded bed search and currently do not have a bed offer for this gentleman. [name] aware of need to expand search due to Medicaid will be difficult to find a bed. Pt is aware of the plan but would still rather go home from here.           Expected Discharge Plan and Services                                                 Social Determinants of Health (SDOH) Interventions    Readmission Risk Interventions No flowsheet data found.

## 2020-03-10 NOTE — Progress Notes (Signed)
[facility]  - [place] [facility]   PATIENT NAME: Brad Graham    MR#:  [id number]  DATE OF BIRTH:  28-Jun-1961  SUBJECTIVE:   Patient requesting to discharge. Per RN he is two person assist. He has some slurring speech which appears chronic. Tolerated PO diet. REVIEW OF SYSTEMS:   Review of Systems  Constitutional: Negative for chills, fever and weight loss.  HENT: Negative for ear discharge, ear pain and nosebleeds.   Eyes: Negative for blurred vision, pain and discharge.  Respiratory: Negative for sputum production, shortness of breath, wheezing and stridor.   Cardiovascular: Negative for chest pain, palpitations, orthopnea and PND.  Gastrointestinal: Negative for abdominal pain, diarrhea, nausea and vomiting.  Genitourinary: Negative for frequency and urgency.  Musculoskeletal: Negative for back pain and joint pain.  Neurological: Negative for sensory change, speech change, focal weakness and weakness.  Psychiatric/Behavioral: Negative for depression and hallucinations. The patient is not nervous/anxious.    Tolerating Diet:YES Tolerating PT: CIR or SNF  DRUG ALLERGIES:  No Known Allergies  VITALS:  Blood pressure (!) 149/92, pulse 97, temperature 98.8 F (37.1 C), temperature source Oral, resp. rate 20, height 6' (1.829 m), weight 119.5 kg, SpO2 91 %.  PHYSICAL EXAMINATION:   Physical Exam  GENERAL:  59 y.o.-year-old patient lying in the bed with no acute distress.  EYES: Pupils equal, round, reactive to light and accommodation. No scleral icterus.   HEENT: Head atraumatic, normocephalic. Oropharynx and nasopharynx clear.  NECK:  Supple, no jugular venous distention. No thyroid enlargement, no tenderness.  LUNGS: Normal breath sounds bilaterally, no wheezing, rales, rhonchi. No use of accessory muscles of respiration.  CARDIOVASCULAR: S1, S2 normal. No murmurs, rubs, or gallops.  ABDOMEN: Soft, nontender, nondistended. Bowel sounds present. No  organomegaly or mass.  EXTREMITIES: No cyanosis, clubbing or edema b/l.    NEUROLOGIC: Cranial nerves II through XII are intact. No focal Motor or sensory deficits b/l.  Slurred speech, weak PSYCHIATRIC:  patient is alert and oriented x 2.  SKIN: 2 cm linear breakdown to apex of gluteal cleft Wound NWG:NFAO and moist Drainage (amount, consistency, odor) scant weeping Periwound:intact  Frequently moist Dressing procedure/placement/frequency: Barrier cream twice daily and PRN soilage   LABORATORY PANEL:  CBC Recent Labs  Lab 03/08/20 0509  WBC 5.7  HGB 10.1*  HCT 29.9*  PLT 134*    Chemistries  Recent Labs  Lab 03/08/20 0509 03/08/20 0509 03/09/20 0811  NA 133*   < > 130*  K 4.0   < > 4.0  CL 98   < > 96*  CO2 25   < > 24  GLUCOSE 153*   < > 123*  BUN 15   < > 17  CREATININE 1.04   < > 1.24  CALCIUM 8.1*   < > 8.2*  MG 1.7   < > 1.9  AST 39  --   --   ALT 13  --   --   ALKPHOS 79  --   --   BILITOT 0.8  --   --    < > = values in this interval not displayed.   Cardiac Enzymes No results for input(s): TROPONINI in the last 168 hours. RADIOLOGY:  No results found. ASSESSMENT AND PLAN:  Brad Graham is a 59 y.o. male with a PMH of Syncopal Episode, HTN, HLD, CVA,Recently Discharged from [facility] 01/28/2020 following treatment of unresponsivenesssecondary to seizuresctivity suspecteddueto ETOH withdrawal (pt was not discharged on anticonvulsants and pt refused  MRI Brain during hospitalization), Arthritis, and Severe ETOH Abuse. He presented to [facility] ER from a [facility] via EMS on 06/9 with altered mental status  *Acute hypoxemic and hypercapnic respiratory failure, s/p PEA cardiac arrest, aspiration pneumonia: -S/p extubation on 03/04/2020 -hemodynamically stable  *Seizure disorder -EEG did not show any evidence of seizures so epileptiform discharges but it was suggestive of moderate diffuse encephalopathy. -  Continue po Dilantin.  Follow-up with  neurologist  *Aspiration pneumonia, coagulase-negative staph bacteremia in 1 bottle:  -now on oral Augmentin.  -CONS in 1 blood culture bottle is a contaminant per ID.    -Speech therapist recommended mechanical soft diet for dysphagia  *AKI complicated by metabolic acidosis and hyperkalemia: Resolved.  Discontinue IV fluids   *Acute metabolic encephalopathy: Continue supportive care  *Type 2 diabetes mellitus: NovoLog as needed for hyperglycemia  *Thrombocytopenia: Platelet count is improving continue to monitor.  *Probable septic versus cardiogenic shock: Resolved  *Alcohol use disorder: Ativan as needed for withdrawal syndrome  *Body mass index is 35.73 kg/m.  (Morbid obesity)    DVT prophylaxis: SCD Code Status: DNR Family Communication:  patient's sister, Hoyle Sauer IS AWARE  Disposition Plan: tbd SNF vs CIR   Status is: Inpatient  Remains inpatient appropriate because:Unsafe d/c plan   Dispo: The patient is from: Group home  Anticipated d/c is to: CIR OR snf  Anticipated d/c date is: medically stable at present. Waiting for bed availbility  Patient currently is medically stable to d/c.   TOTAL TIME TAKING CARE OF THIS PATIENT: 25 minutes.  >50% time spent on counselling and coordination of care  Note: This dictation was prepared with Dragon dictation along with smaller phrase technology. Any transcriptional errors that result from this process are unintentional.  Fritzi Mandes M.D    [facility]   CC: Primary care physician; Carnella Guadalajara I, NPPatient ID: Brad Graham, male   DOB: [birth year], 59 y.o.   MRN: [id number]

## 2020-03-10 NOTE — Progress Notes (Signed)
Rehab Admissions Coordinator Note:  Confirmed with TOC SW that the patient does not have the recommended support needed for an IP Rehab stay. Will not pursue consult order for this patient at this time.   Cheri Rous 03/10/2020, 3:57 PM  I can be reached at [phone].

## 2020-03-11 LAB — BASIC METABOLIC PANEL
Anion gap: 11 (ref 5–15)
BUN: 16 mg/dL (ref 6–20)
CO2: 22 mmol/L (ref 22–32)
Calcium: 8.5 mg/dL — ABNORMAL LOW (ref 8.9–10.3)
Chloride: 97 mmol/L — ABNORMAL LOW (ref 98–111)
Creatinine, Ser: 1.11 mg/dL (ref 0.61–1.24)
GFR calc Af Amer: >60 mL/min (ref >60)
GFR calc non Af Amer: >60 mL/min (ref >60)
Glucose, Bld: 124 mg/dL — ABNORMAL HIGH (ref 70–99)
Potassium: 3.7 mmol/L (ref 3.5–5.1)
Sodium: 130 mmol/L — ABNORMAL LOW (ref 135–145)

## 2020-03-11 LAB — MAGNESIUM: Magnesium: 1.7 mg/dL (ref 1.7–2.4)

## 2020-03-11 LAB — PHOSPHORUS: Phosphorus: 3.4 mg/dL (ref 2.5–4.6)

## 2020-03-11 LAB — GLUCOSE, CAPILLARY
Glucose-Capillary: 115 mg/dL — ABNORMAL HIGH (ref 70–99)
Glucose-Capillary: 235 mg/dL — ABNORMAL HIGH (ref 70–99)
Glucose-Capillary: 139 mg/dL — ABNORMAL HIGH (ref 70–99)
Glucose-Capillary: 113 mg/dL — ABNORMAL HIGH (ref 70–99)

## 2020-03-11 MED ORDER — LORAZEPAM 2 MG/ML IJ SOLN: 1.0000 mg | INTRAMUSCULAR | Status: DC | PRN

## 2020-03-11 NOTE — Progress Notes (Signed)
[place] [facility] [place] NAME: Brad Graham    MR#:  [id number]  DATE OF BIRTH:  10-02-1960  SUBJECTIVE:   Patient requesting to discharge. Per RN he is two person assist. He has some slurring speech which appears chronic. Tolerated PO diet.  UNcle [name] in the room REVIEW OF SYSTEMS:   Review of Systems  Constitutional: Negative for chills, fever and weight loss.  HENT: Negative for ear discharge, ear pain and nosebleeds.   Eyes: Negative for blurred vision, pain and discharge.  Respiratory: Negative for sputum production, shortness of breath, wheezing and stridor.   Cardiovascular: Negative for chest pain, palpitations, orthopnea and PND.  Gastrointestinal: Negative for abdominal pain, diarrhea, nausea and vomiting.  Genitourinary: Negative for frequency and urgency.  Musculoskeletal: Negative for back pain and joint pain.  Neurological: Negative for sensory change, speech change, focal weakness and weakness.  Psychiatric/Behavioral: Negative for depression and hallucinations. The patient is not nervous/anxious.    Tolerating Diet:YES Tolerating PT: CIR or SNF  DRUG ALLERGIES:  No Known Allergies  VITALS:  Blood pressure 139/90, pulse 94, temperature 99.2 F (37.3 C), temperature source Oral, resp. rate 20, height 6' (1.829 m), weight 119.5 kg, SpO2 98 %.  PHYSICAL EXAMINATION:   Physical Exam  GENERAL:  59 y.o.-year-old patient lying in the bed with no acute distress.  EYES: Pupils equal, round, reactive to light and accommodation. No scleral icterus.   HEENT: Head atraumatic, normocephalic. Oropharynx and nasopharynx clear.  NECK:  Supple, no jugular venous distention. No thyroid enlargement, no tenderness.  LUNGS: Normal breath sounds bilaterally, no wheezing, rales, rhonchi. No use of accessory muscles of respiration.  CARDIOVASCULAR: S1, S2 normal. No murmurs, rubs, or gallops.  ABDOMEN: Soft, nontender, nondistended. Bowel  sounds present. No organomegaly or mass.  EXTREMITIES: No cyanosis, clubbing or edema b/l.    NEUROLOGIC: Cranial nerves II through XII are intact. No focal Motor or sensory deficits b/l.  Chronic Slurred speech, weak PSYCHIATRIC:  patient is alert and oriented x 2.  SKIN: 2 cm linear breakdown to apex of gluteal cleft Wound FOY:DXAJ and moist Drainage (amount, consistency, odor) scant weeping Periwound:intact  Frequently moist Dressing procedure/placement/frequency: Barrier cream twice daily and PRN soilage   LABORATORY PANEL:  CBC Recent Labs  Lab 03/08/20 0509  WBC 5.7  HGB 10.1*  HCT 29.9*  PLT 134*    Chemistries  Recent Labs  Lab 03/08/20 0509 03/09/20 0811 03/11/20 0358  NA 133*   < > 130*  K 4.0   < > 3.7  CL 98   < > 97*  CO2 25   < > 22  GLUCOSE 153*   < > 124*  BUN 15   < > 16  CREATININE 1.04   < > 1.11  CALCIUM 8.1*   < > 8.5*  MG 1.7   < > 1.7  AST 39  --   --   ALT 13  --   --   ALKPHOS 79  --   --   BILITOT 0.8  --   --    < > = values in this interval not displayed.   Cardiac Enzymes No results for input(s): TROPONINI in the last 168 hours. RADIOLOGY:  No results found. ASSESSMENT AND PLAN:  Brad Graham is a 60 y.o. male with a PMH of Syncopal Episode, HTN, HLD, CVA,Recently Discharged from [facility] 01/28/2020 following treatment of unresponsivenesssecondary to seizuresctivity suspecteddueto ETOH withdrawal (pt was not  discharged on anticonvulsants and pt refused MRI Brain during hospitalization), Arthritis, and Severe ETOH Abuse. He presented to [facility] ER from a Group Home via EMS on 06/9 with altered mental status  *Acute hypoxemic and hypercapnic respiratory failure, s/p PEA cardiac arrest, aspiration pneumonia: -S/p extubation on 03/04/2020 -hemodynamically stable  *Seizure disorder -EEG did not show any evidence of seizures so epileptiform discharges but it was suggestive of moderate diffuse encephalopathy. -  Continue po Dilantin.   Follow-up with neurologist  *Aspiration pneumonia, coagulase-negative staph bacteremia in 1 bottle:  -now on oral Augmentin.  -CONS in 1 blood culture bottle is a contaminant per ID.    -Speech therapist recommended mechanical soft diet for dysphagia  *AKI complicated by metabolic acidosis and hyperkalemia: Resolved.  Discontinue IV fluids   *Acute metabolic encephalopathy: Continue supportive care  *Type 2 diabetes mellitus: NovoLog as needed for hyperglycemia  *Thrombocytopenia: Platelet count is improving continue to monitor.  *Probable septic versus cardiogenic shock: Resolved  *Alcohol use disorder: Ativan as needed for withdrawal syndrome  *Body mass index is 35.73 kg/m.  (Morbid obesity)    DVT prophylaxis: SCD Code Status: DNR Family Communication:  patient's sister, [name] IS AWARE  Disposition Plan: tbd SNF vs CIR   Status is: Inpatient  Remains inpatient appropriate because:Unsafe d/c plan   Dispo: The patient is from: Group home  Anticipated d/c is to: CIR OR snf  Anticipated d/c date is: medically stable at present. Waiting for bed availbility  Patient currently is medically stable to d/c.   TOTAL TIME TAKING CARE OF THIS PATIENT: 25 minutes.  >50% time spent on counselling and coordination of care  Note: This dictation was prepared with Dragon dictation along with smaller phrase technology. Any transcriptional errors that result from this process are unintentional.  Enedina Finner M.D    [facility]   CC: Primary care physician; Mardene Celeste I, NPPatient ID: Brad Graham, male   DOB: Jun 27, 1961, 59 y.o.   MRN: [id number]

## 2020-03-11 NOTE — Progress Notes (Signed)
Physical Therapy Treatment Patient Details Name: Brad Graham MRN: [id number] DOB: December 30, 1960 Today's Date: 03/11/2020    History of Present Illness admitted for acute hospitalization s/p PEA arrest after grand mal seizures, cardiogenic shock (requiring CPR x2-3 minutes), requiring intubation for airway protection 6/9-6/11.    PT Comments    Pt was long sitting in bed upon arriving. He agrees to PT/OT co-treat which is required 2/2 to pt fatigues quickly and requires +2 assist for all OOB activity. Pt was cooperative and motivated throughout. Pt required O2 throughout session to maintain sao2 > 90%. On 2 L [place] upon arriving but required increase supply during standing activity.  Increased to 4 L during OOB activity but returned to 2L at conclusion of session with sao2 93%. He required increased time and mod assist to exit bed. Pt does have sever L lateral lean. More so in standing than sitting. Max assist to stand to RW with max assist to maintain balance. Severe L lateral lean with max vcs and assist to wt shift R. Pt is SOB with minimal activity. Fatigues quickly. Unable to take side steps safely. Overall pt tolerated session well but still will require extensive skilled PT going forward. Pt will benefit from continued PT to address deficits and improve independence. At conclusion of session, call bell was in reach and RN aware of pt's abilities.    Follow Up Recommendations  CIR;SNF     Equipment Recommendations  Rolling walker with 5" wheels;3in1 (PT)    Recommendations for Other Services       Precautions / Restrictions Precautions Precautions: Fall Restrictions Weight Bearing Restrictions: No    Mobility  Bed Mobility Overal bed mobility: Needs Assistance Bed Mobility: Supine to Sit;Sit to Supine     Supine to sit: +2 for safety/equipment;HOB elevated;Mod assist Sit to supine: Max assist   General bed mobility comments: pt requires MOd assist to exit Bed but max to  return after OOB activity. +2 assist during session for safety   Transfers Overall transfer level: Needs assistance Equipment used: Rolling walker (2 wheeled) Transfers: Sit to/from Stand Sit to Stand: +2 physical assistance;Mod assist;From elevated surface         General transfer comment: pt requires +2 assist to safely stand ~ 4 x EOB. heavy L lateral lean with poor ability to correct. Pt is at hiigh fall risk. Did not have pt pivot to chair 2/2 to safety concerns with RN staff returning to bed.   Ambulation/Gait             General Gait Details: unable to progress 2/2 to poor static standing balance with BUE support   Stairs             Wheelchair Mobility    Modified Rankin (Stroke Patients Only)       Balance Overall balance assessment: Needs assistance Sitting-balance support: No upper extremity supported;Feet supported Sitting balance-Leahy Scale: Fair Sitting balance - Comments: fair sitting balance with slight L lateral lean Postural control: Left lateral lean;Posterior lean Standing balance support: Bilateral upper extremity supported Standing balance-Leahy Scale: Zero Standing balance comment: requires max assist to maintain balance with BUE support throughout. +2 assist for safety. therapist protetcing LLE to prevent buckling                            Cognition Arousal/Alertness: Awake/alert Behavior During Therapy: WFL for tasks assessed/performed Overall Cognitive Status: History of cognitive impairments - at baseline  General Comments: Pt was A and O x 4 today and agreeable and motivated for PT/OT co-treat      Exercises Other Exercises Other Exercises: Pt educated re: proper posture for ADLs, safe t/f technique, falls prevention, PLB  Other Exercises: PLB, sup<>sit, sit<>stand x3, sitting/standing balance/tolerance, BUE AAROM     General Comments        Pertinent Vitals/Pain  Pain Assessment: No/denies pain    Home Living Family/patient expects to be discharged to:: Group home                    Prior Function            PT Goals (current goals can now be found in the care plan section) Acute Rehab PT Goals Patient Stated Goal: to get better Progress towards PT goals: Progressing toward goals    Frequency    Min 2X/week      PT Plan Current plan remains appropriate    Co-evaluation   Reason for Co-Treatment: Complexity of the patient's impairments (multi-system involvement) PT goals addressed during session: Mobility/safety with mobility;Balance;Strengthening/ROM OT goals addressed during session: ADL's and self-care;Proper use of Adaptive equipment and DME      AM-PAC PT "6 Clicks" Mobility   Outcome Measure  Help needed turning from your back to your side while in a flat bed without using bedrails?: A Lot Help needed moving from lying on your back to sitting on the side of a flat bed without using bedrails?: A Lot Help needed moving to and from a bed to a chair (including a wheelchair)?: A Lot Help needed standing up from a chair using your arms (e.g., wheelchair or bedside chair)?: A Lot Help needed to walk in hospital room?: Total Help needed climbing 3-5 steps with a railing? : Total 6 Click Score: 10    End of Session Equipment Utilized During Treatment: Gait belt Activity Tolerance: Patient tolerated treatment well;Patient limited by fatigue Patient left: in bed;with call bell/phone within reach;with bed alarm set Nurse Communication: Mobility status PT Visit Diagnosis: Difficulty in walking, not elsewhere classified (R26.2);Other symptoms and signs involving the nervous system (R29.898)     Time: 4970-2637 PT Time Calculation (min) (ACUTE ONLY): 28 min  Charges:  $Therapeutic Activity: 8-22 mins                     Jetta Lout PTA 03/11/20, 2:48 PM

## 2020-03-11 NOTE — Progress Notes (Signed)
Occupational Therapy Treatment Patient Details Name: Brad Graham MRN: [id number] DOB: April 24, 1961 Today's Date: 03/11/2020    History of present illness admitted for acute hospitalization s/p PEA arrest after grand mal seizures, cardiogenic shock (requiring CPR x2-3 minutes), requiring intubation for airway protection 6/9-6/11.   OT comments  Brad Graham was seen for OT/PT co-treatment on this date. Upon arrival to room pt awake eating lunch, agreeable to OT/PT tx. Requiring SETUP for self-feeding at bed level c MIN A to adjust trunk posture. SBA sitting EOB ~5 mins for BUE AAROM in preparation for bimanual grooming tasks. MIN A don/doff gown seated EOB. MAX A x2 + RW sit<>stand at EOB and ~104ft side steps along EOB - pt requires MAX verbal/tactile cues for upright posture, unable to correst L lateral lean and weight shifting. Pt making good progress toward goals. Pt continues to benefit from skilled OT services to maximize return to PLOF and minimize risk of future falls, injury, caregiver burden, and readmission. Will continue to follow POC. Discharge recommendation remains appropriate.    Follow Up Recommendations  CIR;SNF    Equipment Recommendations  Other (comment) (TBD at next venue of care)    Recommendations for Other Services      Precautions / Restrictions Precautions Precautions: Fall Restrictions Weight Bearing Restrictions: No       Mobility Bed Mobility Overal bed mobility: Needs Assistance Bed Mobility: Supine to Sit;Sit to Supine     Supine to sit: Mod assist;HOB elevated;+2 for physical assistance Sit to supine: Max assist      Transfers Overall transfer level: Needs assistance Equipment used: Rolling walker (2 wheeled) Transfers: Sit to/from Stand Sit to Stand: +2 physical assistance;Max assist              Balance Overall balance assessment: Needs assistance Sitting-balance support: No upper extremity supported;Feet supported Sitting  balance-Leahy Scale: Good     Standing balance support: Bilateral upper extremity supported Standing balance-Leahy Scale: Zero                             ADL either performed or assessed with clinical judgement   ADL Overall ADL's : Needs assistance/impaired                                       General ADL Comments: SETUP self-feeding at bed level c MIN A to adjust posture. SBA sitting EOB ~5 mins for THEREX w/o UE support in preparation for grooming tasks. MIN A don/doff gown seated EOB      Vision       Perception     Praxis      Cognition Arousal/Alertness: Awake/alert Behavior During Therapy: WFL for tasks assessed/performed;Flat affect Overall Cognitive Status: History of cognitive impairments - at baseline                                          Exercises Exercises: Other exercises Other Exercises Other Exercises: Pt educated re: proper posture for ADLs, safe t/f technique, falls prevention, PLB  Other Exercises: PLB, sup<>sit, sit<>stand x3, sitting/standing balance/tolerance, BUE AAROM    Shoulder Instructions       General Comments      Pertinent Vitals/ Pain       Pain Assessment: No/denies  pain  Home Living Family/patient expects to be discharged to:: Group home                                        Prior Functioning/Environment              Frequency  Min 2X/week        Progress Toward Goals  OT Goals(current goals can now be found in the care plan section)  Progress towards OT goals: Progressing toward goals  Acute Rehab OT Goals Patient Stated Goal: to get better OT Goal Formulation: With patient/family Time For Goal Achievement: 03/21/20 Potential to Achieve Goals: Good ADL Goals Pt Will Perform Grooming: with set-up;with min assist;sitting Pt Will Perform Lower Body Dressing: with set-up;with min assist;sitting/lateral leans Pt Will Transfer to Toilet: with min  assist;with +2 assist (rolling at bed level) Additional ADL Goal #1: Pt will tolerate >5 mins sitting EOB c SBA in preparation for funcitonal ADL tasks  Plan Frequency remains appropriate;Discharge plan remains appropriate    Co-evaluation    PT/OT/SLP Co-Evaluation/Treatment: Yes Reason for Co-Treatment: Complexity of the patient's impairments (multi-system involvement);To address functional/ADL transfers;For patient/therapist safety PT goals addressed during session: Mobility/safety with mobility;Proper use of DME OT goals addressed during session: ADL's and self-care;Proper use of Adaptive equipment and DME      AM-PAC OT "6 Clicks" Daily Activity     Outcome Measure   Help from another person eating meals?: A Little Help from another person taking care of personal grooming?: A Little Help from another person toileting, which includes using toliet, bedpan, or urinal?: A Lot Help from another person bathing (including washing, rinsing, drying)?: A Lot Help from another person to put on and taking off regular upper body clothing?: A Lot Help from another person to put on and taking off regular lower body clothing?: A Lot 6 Click Score: 14    End of Session Equipment Utilized During Treatment: Gait belt;Rolling walker;Oxygen (3 L O2)  OT Visit Diagnosis: Unsteadiness on feet (R26.81);Other abnormalities of gait and mobility (R26.89);Muscle weakness (generalized) (M62.81)   Activity Tolerance Patient tolerated treatment well   Patient Left in bed;with call bell/phone within reach;with bed alarm set   Nurse Communication          Time: 4174-0814 OT Time Calculation (min): 28 min  Charges: OT General Charges $OT Visit: 1 Visit OT Treatments $Therapeutic Activity: 8-22 mins  Dessie Coma, M.S. OTR/L  03/11/20, 2:26 PM

## 2020-03-11 NOTE — Consult Note (Signed)
PHARMACY CONSULT NOTE  Pharmacy Consult for Electrolyte Monitoring and Replacement   Recent Labs: Potassium (mmol/L)  Date Value  03/11/2020 3.7  05/02/2014 3.6   Magnesium (mg/dL)  Date Value  28/00/3491 1.7   Calcium (mg/dL)  Date Value  79/15/0569 8.5 (L)   Calcium, Total (mg/dL)  Date Value  79/48/0165 8.5   Albumin (g/dL)  Date Value  53/74/8270 2.7 (L)  02/16/2020 4.6  05/02/2014 3.8   Phosphorus (mg/dL)  Date Value  78/67/5449 3.4   Sodium (mmol/L)  Date Value  03/11/2020 130 (L)  02/16/2020 137  05/02/2014 126 (L)   Corrected Ca: 9.0 mg/dL  Assessment: 58 yo male admitted s/p PEA arrest following a grand mal seizure with acute renal failure with hyperkalemia, severe metabolic acidosis, and cardiogenic shock requiring mechanical intubation and vasopressorsnow extubated and transferred to 1C.    Goal of Therapy:  Potassium 4.0 - 5.1 mmol/L Magnesium 2.0 - 2.4 mg/dL All Other Electrolytes WNL  Plan:   F/u electrolytes am 6/19 and replace as needed  No replenishment warranted today  Clovia Cuff, PharmD, BCPS 03/11/2020 3:54 PM

## 2020-03-12 LAB — BASIC METABOLIC PANEL
Anion gap: 11 (ref 5–15)
BUN: 13 mg/dL (ref 6–20)
CO2: 22 mmol/L (ref 22–32)
Calcium: 8.7 mg/dL — ABNORMAL LOW (ref 8.9–10.3)
Chloride: 95 mmol/L — ABNORMAL LOW (ref 98–111)
Creatinine, Ser: 0.98 mg/dL (ref 0.61–1.24)
GFR calc Af Amer: >60 mL/min (ref >60)
GFR calc non Af Amer: >60 mL/min (ref >60)
Glucose, Bld: 159 mg/dL — ABNORMAL HIGH (ref 70–99)
Potassium: 3.6 mmol/L (ref 3.5–5.1)
Sodium: 128 mmol/L — ABNORMAL LOW (ref 135–145)

## 2020-03-12 LAB — GLUCOSE, CAPILLARY
Glucose-Capillary: 137 mg/dL — ABNORMAL HIGH (ref 70–99)
Glucose-Capillary: 127 mg/dL — ABNORMAL HIGH (ref 70–99)
Glucose-Capillary: 62 mg/dL — ABNORMAL LOW (ref 70–99)
Glucose-Capillary: 133 mg/dL — ABNORMAL HIGH (ref 70–99)
Glucose-Capillary: 190 mg/dL — ABNORMAL HIGH (ref 70–99)

## 2020-03-12 LAB — MAGNESIUM: Magnesium: 1.6 mg/dL — ABNORMAL LOW (ref 1.7–2.4)

## 2020-03-12 MED ORDER — MAGNESIUM SULFATE 2 GM/50ML IV SOLN
2.0000 g | Freq: Once | INTRAVENOUS | Status: DC
  Filled 2020-03-12: qty 50

## 2020-03-12 MED ORDER — PHENYTOIN SODIUM EXTENDED 100 MG PO CAPS: ORAL_CAPSULE | ORAL | 0 refills | Status: DC

## 2020-03-12 MED ORDER — POTASSIUM CHLORIDE CRYS ER 20 MEQ PO TBCR
40.0000 meq | EXTENDED_RELEASE_TABLET | Freq: Once | ORAL | Status: DC
  Filled 2020-03-12: qty 2

## 2020-03-12 MED ORDER — ADULT MULTIVITAMIN W/MINERALS CH
1.0000 | ORAL_TABLET | Freq: Every day | ORAL | Status: DC
  Administered 2020-03-12 – 2020-03-14 (×3): 1 via ORAL
  Filled 2020-03-12 (×3): qty 1

## 2020-03-12 MED ORDER — HYDRALAZINE HCL 25 MG PO TABS: 25.0000 mg | ORAL_TABLET | Freq: Two times a day (BID) | ORAL | 0 refills | Status: DC

## 2020-03-12 MED ORDER — MAGNESIUM OXIDE 400 (241.3 MG) MG PO TABS
400.0000 mg | ORAL_TABLET | Freq: Two times a day (BID) | ORAL | Status: DC
  Administered 2020-03-12 – 2020-03-14 (×4): 400 mg via ORAL
  Filled 2020-03-12 (×4): qty 1

## 2020-03-12 MED ORDER — MAGNESIUM OXIDE 400 (241.3 MG) MG PO TABS: 400.0000 mg | ORAL_TABLET | Freq: Two times a day (BID) | ORAL | 0 refills | Status: DC

## 2020-03-12 NOTE — Progress Notes (Signed)
Patient ID: Brad Graham, male   DOB: 1960-12-28, 59 y.o.   MRN: [id number] Per CSW pt needs to show his vaccine record to go to the facility--pt cannot locate his card. The bed in the [facility] will be available on Monday. Will d/c on Monday. CSW will update family

## 2020-03-12 NOTE — Consult Note (Addendum)
PHARMACY CONSULT NOTE  Pharmacy Consult for Electrolyte Monitoring and Replacement   Recent Labs: Potassium (mmol/L)  Date Value  03/12/2020 3.6  05/02/2014 3.6   Magnesium (mg/dL)  Date Value  99/24/2683 1.6 (L)   Calcium (mg/dL)  Date Value  41/96/2229 8.7 (L)   Calcium, Total (mg/dL)  Date Value  79/89/2119 8.5   Albumin (g/dL)  Date Value  41/74/0814 2.7 (L)  02/16/2020 4.6  05/02/2014 3.8   Phosphorus (mg/dL)  Date Value  48/18/5631 3.4   Sodium (mmol/L)  Date Value  03/12/2020 128 (L)  02/16/2020 137  05/02/2014 126 (L)   Corrected Ca: 9.0 mg/dL  Assessment: 59 yo male admitted s/p PEA arrest following a grand mal seizure with acute renal failure with hyperkalemia, severe metabolic acidosis, and cardiogenic shock requiring mechanical intubation and vasopressorsnow extubated and transferred to 1C.    Goal of Therapy:  Potassium 4.0 - 5.1 mmol/L Magnesium 2.0 - 2.4 mg/dL All Other Electrolytes WNL  Plan:  Will give Mg 2 g IV x 1 and KCl 40 mEq x 1 PO.   F/u electrolytes am 6/19 and replace as needed  No replenishment warranted today  Paschal Dopp, PharmD, BCPS 03/12/2020 7:38 AM

## 2020-03-12 NOTE — Discharge Summary (Signed)
[place] [facility] [place] NAME: Brad Graham    MR#:  [id number]  DATE OF BIRTH:  Oct 26, 1960  DATE OF ADMISSION:  03/02/2020 ADMITTING PHYSICIAN: Earleen Newport, MD  DATE OF DISCHARGE: 03/12/2020  PRIMARY CARE PHYSICIAN: Carnella Guadalajara I, NP    ADMISSION DIAGNOSIS:  Cardiac arrest ([place]) [I46.9] Cardiopulmonary arrest ([place]) [I46.9] Acidemia [E87.2] Encounter for intubation [Z01.818] Acute renal failure, unspecified acute renal failure type ([place]) [N17.9]  DISCHARGE DIAGNOSIS:  Acute hypoxic hyper cardiac respiratory failure status post PEA cardiac arrest requiring mechanical intubation Aspiration pneumonia completed antibiotic Seizure disorder Acute kidney injury complicated by metabolic acidosis and hyperkalemia resolved Acute metabolic encephalopathy improved Type II diabetes  SECONDARY DIAGNOSIS:   Past Medical History:  Diagnosis Date  . Alcohol abuse   . Arthritis   . H/O: CVA (cerebrovascular accident)   . Hyperlipidemia   . Hypertension   . Syncope 07/02/2016  . Syncope and collapse 01/27/2020    HOSPITAL COURSE:  Brad A Baldwinis a 59 y.o.malewith a PMH of Syncopal Episode, HTN, HLD, CVA,Recently Discharged from [facility] 01/28/2020 following treatment of unresponsivenesssecondary to seizuresctivity suspecteddueto ETOH withdrawal (pt was not discharged on anticonvulsants and pt refused MRI Brain during hospitalization), Arthritis, and Severe ETOH Abuse. He presented to [facility] ER from a [place] via EMS on 06/9 with altered mental status  *Acute hypoxemic and hypercapnic respiratory failure, s/p PEA cardiac arrest, aspiration pneumonia: -S/p extubation on 03/04/2020 -hemodynamically stable  *Seizure disorder -EEG did not show any evidence of seizures so epileptiform discharges but it was suggestive of moderate diffuse encephalopathy. - Continue po Dilantin. Follow-up with neurologist -check dilantin levels  periodically  *Aspiration pneumonia, coagulase-negative staph bacteremia in 1 bottle: -now on oral Augmentin--completed course -CONSin 1 blood culture bottle is a contaminant per ID. -Speech therapist recommended mechanical soft diet for dysphagia  *AKI complicated by metabolic acidosis and hyperkalemia:Resolved.   *Acute metabolic encephalopathy: Continue supportive care  *Type 2 diabetes mellitus: NovoLog as needed for hyperglycemia Resumed metformin  *Thrombocytopenia: Platelet count is improving continue to monitor.  *HTN Cont amlodipine, hydralazine and coreg  *Alcohol use disorder -MVI, thiamine and folate  *Body mass index is 35.73 kg/m.(Morbid obesity)    DVT prophylaxis: SCD Code Status:DNR Family Communication:none today Disposition Plan: [facility] rehab   Status is: Inpatient   Dispo: The patient is from: Group home Anticipated d[place] [facility] [employer] Anticipated d/c date is: medically stable at present. Patient currently is medically stable to d/c.  CONSULTS OBTAINED:  Treatment Team:  Thayer Headings, MD Leotis Pain, MD  DRUG ALLERGIES:  No Known Allergies  DISCHARGE MEDICATIONS:   Allergies as of 03/12/2020   No Known Allergies     Medication List    TAKE these medications   amLODipine 10 MG tablet Commonly known as: NORVASC Take 1 tablet (10 mg total) by mouth daily.   aspirin EC 81 MG tablet Take 1 tablet (81 mg total) by mouth daily.   atorvastatin 20 MG tablet Commonly known as: LIPITOR Take 1 tablet (20 mg total) by mouth daily at 6 PM.   blood glucose meter kit and supplies Kit Dispense based on patient and insurance preference. Use up to four times daily as directed. (FOR ICD-9 250.00, 250.01).   carvedilol 25 MG tablet Commonly known as: COREG Take 1 tablet (25 mg total) by mouth 2 (two) times daily with a meal.   folic acid 1 MG  tablet Commonly known as: [employer]  Take 1 tablet (1 mg total) by mouth daily.   hydrALAZINE 25 MG tablet Commonly known as: APRESOLINE Take 1 tablet (25 mg total) by mouth 2 (two) times daily. What changed: when to take this   magnesium oxide 400 (241.3 Mg) MG tablet Commonly known as: MAG-OX Take 1 tablet (400 mg total) by mouth 2 (two) times daily.   metFORMIN 1000 MG tablet Commonly known as: Glucophage Take 1 tablet (1,000 mg total) by mouth daily with breakfast.   multivitamin with minerals Tabs tablet Take 1 tablet by mouth daily.   phenytoin 100 MG ER capsule Commonly known as: DILANTIN Take 300 mg at bedtime and 200 mg at daytime   tamsulosin 0.4 MG Caps capsule Commonly known as: FLOMAX Take 1 capsule (0.4 mg total) by mouth daily after supper.   thiamine 100 MG tablet Take 1 tablet (100 mg total) by mouth daily.            Discharge Care Instructions  (From admission, onward)         Start     Ordered   03/12/20 0000  Discharge wound care:       Comments: Dressing procedure/placement/frequency: Barrier cream twice daily and PRN soilage over the buttocks   03/12/20 1040          If you experience worsening of your admission symptoms, develop shortness of breath, life threatening emergency, suicidal or homicidal thoughts you must seek medical attention immediately by calling 911 or calling your MD immediately  if symptoms less severe.  You Must read complete instructions/literature along with all the possible adverse reactions/side effects for all the Medicines you take and that have been prescribed to you. Take any new Medicines after you have completely understood and accept all the possible adverse reactions/side effects.   Please note  You were cared for by a hospitalist during your hospital stay. If you have any questions about your discharge medications or the care you received while you were in the hospital after you are discharged, you can call  the unit and asked to speak with the hospitalist on call if the hospitalist that took care of you is not available. Once you are discharged, your primary care physician will handle any further medical issues. Please note that NO REFILLS for any discharge medications will be authorized once you are discharged, as it is imperative that you return to your primary care physician (or establish a relationship with a primary care physician if you do not have one) for your aftercare needs so that they can reassess your need for medications and monitor your lab values. Today   SUBJECTIVE   Doing well  No complaints No family in room today   VITAL SIGNS:  Blood pressure (!) 152/89, pulse 93, temperature 98.2 F (36.8 C), temperature source Oral, resp. rate 18, height 6' (1.829 m), weight 119.5 kg, SpO2 98 %.  I/O:    Intake/Output Summary (Last 24 hours) at 03/12/2020 1042 Last data filed at 03/12/2020 1034 Gross per 24 hour  Intake 0 ml  Output 427 ml  Net -427 ml    PHYSICAL EXAMINATION:  GENERAL:  59 y.o.-year-old patient lying in the bed with no acute distress.  EYES: Pupils equal, round, reactive to light and accommodation. No scleral icterus.  HEENT: Head atraumatic, normocephalic. Oropharynx and nasopharynx clear.  .  LUNGS: Normal breath sounds bilaterally, no wheezing, rales,rhonchi or crepitation. No use of accessory muscles of respiration.  CARDIOVASCULAR: S1, S2 normal. No murmurs,  rubs, or gallops.  ABDOMEN: Soft, non-tender, non-distended. Bowel sounds present. No organomegaly or mass.  EXTREMITIES: No pedal edema, cyanosis, or clubbing.  NEUROLOGIC: Cranial nerves II through XII are intact. Muscle strength 4+/5 in all extremities. Sensation intact. Gait not checked. Mild dysarthria PSYCHIATRIC:  patient is alert and oriented x 3.  SKIN:Wound type:MASD Pressure Injury POA: NA Measurement: 2 cm linear breakdown to apex of gluteal cleft   DATA REVIEW:   CBC  Recent Labs   Lab 03/08/20 0509  WBC 5.7  HGB 10.1*  HCT 29.9*  PLT 134*    Chemistries  Recent Labs  Lab 03/08/20 0509 03/09/20 0811 03/12/20 0622  NA 133*   < > 128*  K 4.0   < > 3.6  CL 98   < > 95*  CO2 25   < > 22  GLUCOSE 153*   < > 159*  BUN 15   < > 13  CREATININE 1.04   < > 0.98  CALCIUM 8.1*   < > 8.7*  MG 1.7   < > 1.6*  AST 39  --   --   ALT 13  --   --   ALKPHOS 79  --   --   BILITOT 0.8  --   --    < > = values in this interval not displayed.    Microbiology Results   Recent Results (from the past 240 hour(s))  Blood Culture (routine x 2)     Status: Abnormal   Collection Time: 03/02/20  8:01 PM   Specimen: BLOOD  Result Value Ref Range Status   Specimen Description   Final    BLOOD LEFT ANTECUBITAL Performed at [facility], [address] [place], [place] [address]    Special Requests   Final    BOTTLES DRAWN AEROBIC AND ANAEROBIC Blood Culture adequate volume Performed at [facility], [address] [place], [place] [address]    Culture  Setup Time   Final    GRAM POSITIVE COCCI ANAEROBIC BOTTLE ONLY CRITICAL RESULT CALLED TO, READ BACK BY AND VERIFIED WITH: WALID NAZARI AT 1960 03/03/20 BY ACR.PMF AEROBIC BOTTLE ONLY GRAM POSITIVE RODS CRITICAL RESULT CALLED TO, READ BACK BY AND VERIFIED WITH: SCOTT HALL ON 03/04/20 AT 2212 [facility] Performed at [facility], [address] [place], [place] [address]    Culture (A)  Final    STAPHYLOCOCCUS SPECIES (COAGULASE NEGATIVE) THE SIGNIFICANCE OF ISOLATING THIS ORGANISM FROM A SINGLE SET OF BLOOD CULTURES WHEN MULTIPLE SETS ARE DRAWN IS UNCERTAIN. PLEASE NOTIFY THE MICROBIOLOGY DEPARTMENT WITHIN ONE WEEK IF SPECIATION AND SENSITIVITIES ARE REQUIRED. DIPHTHEROIDS(CORYNEBACTERIUM SPECIES) Standardized susceptibility testing for this organism is not available. Performed at [facility], [place] [address], [place], [place] [address]    Report Status 03/08/2020 FINAL  Final  Blood  Culture ID Panel (Reflexed)     Status: Abnormal   Collection Time: 03/02/20  8:01 PM  Result Value Ref Range Status   Enterococcus species NOT DETECTED NOT DETECTED Final   Listeria monocytogenes NOT DETECTED NOT DETECTED Final   Staphylococcus species DETECTED (A) NOT DETECTED Final    Comment: Methicillin (oxacillin) susceptible coagulase negative staphylococcus. Possible blood culture contaminant (unless isolated from more than one blood culture draw or clinical case suggests pathogenicity). No antibiotic treatment is indicated for blood  culture contaminants. CRITICAL RESULT CALLED TO, READ BACK BY AND VERIFIED WITH: WALID NAZARI 03/03/2020 @1960  BY ACR    Staphylococcus aureus (BCID) NOT  DETECTED NOT DETECTED Final   Methicillin resistance NOT DETECTED NOT DETECTED Final   Streptococcus species NOT DETECTED NOT DETECTED Final   Streptococcus agalactiae NOT DETECTED NOT DETECTED Final   Streptococcus pneumoniae NOT DETECTED NOT DETECTED Final   Streptococcus pyogenes NOT DETECTED NOT DETECTED Final   Acinetobacter baumannii NOT DETECTED NOT DETECTED Final   Enterobacteriaceae species NOT DETECTED NOT DETECTED Final   Enterobacter cloacae complex NOT DETECTED NOT DETECTED Final   Escherichia coli NOT DETECTED NOT DETECTED Final   Klebsiella oxytoca NOT DETECTED NOT DETECTED Final   Klebsiella pneumoniae NOT DETECTED NOT DETECTED Final   Proteus species NOT DETECTED NOT DETECTED Final   Serratia marcescens NOT DETECTED NOT DETECTED Final   Haemophilus influenzae NOT DETECTED NOT DETECTED Final   Neisseria meningitidis NOT DETECTED NOT DETECTED Final   Pseudomonas aeruginosa NOT DETECTED NOT DETECTED Final   Candida albicans NOT DETECTED NOT DETECTED Final   Candida glabrata NOT DETECTED NOT DETECTED Final   Candida krusei NOT DETECTED NOT DETECTED Final   Candida parapsilosis NOT DETECTED NOT DETECTED Final   Candida tropicalis NOT DETECTED NOT DETECTED Final    Comment: Performed  at [facility], [address] [place], [place] [address]  Blood Culture (routine x 2)     Status: None   Collection Time: 03/02/20  8:06 PM   Specimen: BLOOD  Result Value Ref Range Status   Specimen Description BLOOD  Final   Special Requests BOTTLES DRAWN AEROBIC AND ANAEROBIC  Final   Culture   Final    NO GROWTH 5 DAYS Performed at [facility], [address], [place], [place] [address]    Report Status 03/07/2020 FINAL  Final  SARS Coronavirus 2 by RT PCR (hospital order, performed in [facility] hospital lab) Nasopharyngeal Nasopharyngeal Swab     Status: None   Collection Time: 03/02/20  8:55 PM   Specimen: Nasopharyngeal Swab  Result Value Ref Range Status   SARS Coronavirus 2 NEGATIVE NEGATIVE Final    Comment: (NOTE) SARS-CoV-2 target nucleic acids are NOT DETECTED. The SARS-CoV-2 RNA is generally detectable in upper and lower respiratory specimens during the acute phase of infection. The lowest concentration of SARS-CoV-2 viral copies this assay can detect is 250 copies / mL. A negative result does not preclude SARS-CoV-2 infection and should not be used as the sole basis for treatment or other patient management decisions.  A negative result may occur with improper specimen collection / handling, submission of specimen other than nasopharyngeal swab, presence of viral mutation(s) within the areas targeted by this assay, and inadequate number of viral copies (<250 copies / mL). A negative result must be combined with clinical observations, patient history, and epidemiological information. Fact Sheet for Patients:   StrictlyIdeas.no Fact Sheet for Healthcare Providers: BankingDealers.co.za This test is not yet approved or cleared  by the [place] FDA and has been authorized for detection and/or diagnosis of SARS-CoV-2 by FDA under an Emergency Use Authorization (EUA).  This EUA will remain in  effect (meaning this test can be used) for the duration of the COVID-19 declaration under Section 564(b)(1) of the Act, 21 U.S.C. section 360bbb-3(b)(1), unless the authorization is terminated or revoked sooner. Performed at [facility], [address] [place], [place] [address]   MRSA PCR Screening     Status: None   Collection Time: 03/02/20 10:30 PM   Specimen: Nasopharyngeal  Result Value Ref Range Status   MRSA by PCR NEGATIVE NEGATIVE Final  Comment:        The GeneXpert MRSA Assay (FDA approved for NASAL specimens only), is one component of a comprehensive MRSA colonization surveillance program. It is not intended to diagnose MRSA infection nor to guide or monitor treatment for MRSA infections. Performed at [facility], [address] [place], [place] [address]   Urine culture     Status: None   Collection Time: 03/03/20  4:40 AM   Specimen: In/Out Cath Urine  Result Value Ref Range Status   Specimen Description   Final    IN/OUT CATH URINE Performed at [facility], [address], [place], [place] [address]    Special Requests   Final    NONE Performed at [facility], [address], [place], [place] [address]    Culture   Final    NO GROWTH Performed at [facility], [place] [address] [place], [place] [address]    Report Status 03/04/2020 FINAL  Final    RADIOLOGY:  No results found.   CODE STATUS:     Code Status Orders  (From admission, onward)         Start     Ordered   03/03/20 0845  Do not attempt resuscitation (DNR)  Continuous       Question Answer Comment  In the event of cardiac or respiratory ARREST Do not call a "code blue"   In the event of cardiac or respiratory ARREST Do not perform Intubation, CPR, defibrillation or ACLS   In the event of cardiac or respiratory ARREST Use medication by any route, position, wound care, and other measures to relive pain and suffering. May use oxygen,  suction and manual treatment of airway obstruction as needed for comfort.      03/03/20 0844        Code Status History    Date Active Date Inactive Code Status Order ID Comments User Context   03/02/2020 2055 03/03/2020 0844 Full Code [id number]  Awilda Bill, NP ED   01/27/2020 1815 01/29/2020 0006 Full Code [id number]  [place], [place], DO ED   08/02/2016 2342 08/04/2016 1559 Full Code [id number]  Harvie Bridge, DO Inpatient   07/02/2016 1354 07/03/2016 1903 Full Code [id number]  Dustin Flock, MD ED   Advance Care Planning Activity       TOTAL TIME TAKING CARE OF THIS PATIENT: 40  minutes.    Fritzi Mandes M.D  [facility]    CC: Primary care physician; Helayne [place], NP

## 2020-03-12 NOTE — Progress Notes (Addendum)
Patient ID: Brad Graham, male   DOB: 12/30/1960, 59 y.o.   MRN: [id number]  Spoke with Uncle larry on the phone. Discussed with him about [place] needing Rehab and given his insurance so far [facility] has offered him a bed. Peyton Najjar tells me he needs to get in touch with pt's sisters [name] and Juliette Alcide since [facility]. Peyton Najjar understands they need to decide today. Other option is pt goes [facility] with [facility] with family.  CSW aware of above

## 2020-03-12 NOTE — TOC Transition Note (Signed)
Transition of Care [facility]) - CM/SW Discharge Note   Patient Details  Name: CHORD TAKAHASHI MRN: [id number] Date of Birth: [birth year]  Transition of Care [facility]) CM/SW Contact:  Eilleen Kempf, LCSW Phone Number: 03/12/2020, 7:58 AM   Clinical Narrative:    CSW called patient's uncle, Perlie Gold and both sisters to discuss options for patient's discharge which is likely going to happen on Saturday 6/19. Pt has one bed offer from [facility] in [place]. Family is aware options are taking pt home or sending him to [facility] at least for now. Pretty sure they will send him to [facility]. His uncle Perlie Gold is pt's guardian. Call Admission's director, Ms. Senaida Ores on her work cell to arrange transfer [phone].          Patient Goals and CMS Choice        Discharge Placement                       Discharge Plan and Services                                     Social Determinants of Health (SDOH) Interventions     Readmission Risk Interventions No flowsheet data found.

## 2020-03-12 NOTE — Discharge Instructions (Signed)
Dysphagia level 3 diet (mech soft w/ Cut meats) w/ gravies added to moisten foods d/t Missing Dentition; Thin liquids Via Cup. General aspiration precautions; Pills Whole in Puree for cohesion and safer swallowing currently; tray setup and positioning assistance for meals w/ support as needed. REFLUX precautions.

## 2020-03-13 LAB — GLUCOSE, CAPILLARY
Glucose-Capillary: 118 mg/dL — ABNORMAL HIGH (ref 70–99)
Glucose-Capillary: 122 mg/dL — ABNORMAL HIGH (ref 70–99)
Glucose-Capillary: 130 mg/dL — ABNORMAL HIGH (ref 70–99)
Glucose-Capillary: 142 mg/dL — ABNORMAL HIGH (ref 70–99)
Glucose-Capillary: 172 mg/dL — ABNORMAL HIGH (ref 70–99)

## 2020-03-13 LAB — BASIC METABOLIC PANEL
Anion gap: 11 (ref 5–15)
BUN: 12 mg/dL (ref 6–20)
CO2: 24 mmol/L (ref 22–32)
Calcium: 8.9 mg/dL (ref 8.9–10.3)
Chloride: 93 mmol/L — ABNORMAL LOW (ref 98–111)
Creatinine, Ser: 0.9 mg/dL (ref 0.61–1.24)
GFR calc Af Amer: >60 mL/min (ref >60)
GFR calc non Af Amer: >60 mL/min (ref >60)
Glucose, Bld: 118 mg/dL — ABNORMAL HIGH (ref 70–99)
Potassium: 3.9 mmol/L (ref 3.5–5.1)
Sodium: 128 mmol/L — ABNORMAL LOW (ref 135–145)

## 2020-03-13 LAB — MAGNESIUM: Magnesium: 1.6 mg/dL — ABNORMAL LOW (ref 1.7–2.4)

## 2020-03-13 MED ORDER — MAGNESIUM SULFATE 2 GM/50ML IV SOLN
2.0000 g | Freq: Once | INTRAVENOUS | Status: DC
  Filled 2020-03-13: qty 50

## 2020-03-13 NOTE — TOC Transition Note (Signed)
Transition of Care [facility]) - CM/SW Discharge Note   Patient Details  Name: Brad Graham MRN: [id number] Date of Birth: 06-Aug-1961  Transition of Care [facility]) CM/SW Contact:  Maud Deed, LCSW Phone Number: 03/13/2020, 11:33 AM   Clinical Narrative:    Pt medically stable for discharge per MD. [facility] call to report number is [phone], Pt will be going to room 417. CSW notified Peyton Najjar pt's legal guardian of discharge. CSW will arrange transport once nursing staff is ready.   Final next level of care: Skilled Nursing Facility Barriers to Discharge: No Barriers Identified   Patient Goals and CMS Choice        Discharge Placement              Patient chooses bed at: [facility] Patient to be transferred to facility by: EMS Name of family member notified: Peyton Najjar Patient and family notified of of transfer: 03/13/20  Discharge Plan and Services                                     Social Determinants of Health (SDOH) Interventions     Readmission Risk Interventions No flowsheet data found.

## 2020-03-13 NOTE — Progress Notes (Signed)
Patient ID: Brad Graham, male   DOB: 1961/05/23, 59 y.o.   MRN: [id number]  Pt's discharge held since CSW is unable to find any Ambulance service today to transport to [place], [place] Will d/c  On monday

## 2020-03-13 NOTE — Consult Note (Signed)
PHARMACY CONSULT NOTE  Pharmacy Consult for Electrolyte Monitoring and Replacement   Recent Labs: Potassium (mmol/L)  Date Value  03/13/2020 3.9  05/02/2014 3.6   Magnesium (mg/dL)  Date Value  34/14/4360 1.6 (L)   Calcium (mg/dL)  Date Value  16/58/0063 8.9   Calcium, Total (mg/dL)  Date Value  49/49/4473 8.5   Albumin (g/dL)  Date Value  95/84/4171 2.7 (L)  02/16/2020 4.6  05/02/2014 3.8   Phosphorus (mg/dL)  Date Value  27/87/1836 3.4   Sodium (mmol/L)  Date Value  03/13/2020 128 (L)  02/16/2020 137  05/02/2014 126 (L)   Corrected Ca: 9.0 mg/dL  Assessment: 59 yo male admitted s/p PEA arrest following a grand mal seizure with acute renal failure with hyperkalemia, severe metabolic acidosis, and cardiogenic shock requiring mechanical intubation and vasopressorsnow extubated and transferred to 1C.    Goal of Therapy:  Potassium 4.0 - 5.1 mmol/L Magnesium 2.0 - 2.4 mg/dL All Other Electrolytes WNL  Plan:  Will give Mg 2 g IV x 1   F/u electrolytes am 6/21 and replace as needed  Paschal Dopp, PharmD, BCPS 03/13/2020 7:45 AM

## 2020-03-13 NOTE — TOC Progression Note (Signed)
Transition of Care [facility]) - Progression Note    Patient Details  Name: Brad Graham MRN: [id number] Date of Birth: 1961-02-24  Transition of Care [facility]) CM/SW Contact  Maud Deed, LCSW Phone Number: 03/13/2020, 8:42 AM  Clinical Narrative:    CSW was consulted by MD to arrange discharge for pt to [facility]. CSW attempted to make contact with pt's legal guardian aware and discuss the decision for the pt to go home or to SNF. CSW spoke to legal guardian and they came to the agreement that the pt would go to [facility] for rehab. CSW contacted Melissa with [facility], she notified CSW that there was currently no bed  availability and the pt would have to wait until Monday.  12:30 CSW received a call from [facility] and she stated "we are shifting some room assignment to offer him a bed tomorrow 6/20. CSW notified MD. Pt will discharge tomorrow 6/20 to [facility].        Expected Discharge Plan and Services           Expected Discharge Date: 03/13/20                                     Social Determinants of Health (SDOH) Interventions    Readmission Risk Interventions No flowsheet data found.

## 2020-03-13 NOTE — TOC Progression Note (Addendum)
Transition of Care [facility]) - Progression Note    Patient Details  Name: Brad Graham MRN: [id number] Date of Birth: 11/09/1960  Transition of Care [name]) CM/SW Contact  Maud Deed, LCSW Phone Number: 03/13/2020, 2:59 PM  Clinical Narrative:    CSW attempted to arrange transport with [facility] and they were unable to transport , [facility] unable to transport to [place], [place], [facility]. CSW contacted on call [facility] supervisor for advice, no answer at this time and unable to leave voicemail.  CSW notified Melissa with [facility] about barrier she advised that he be transported tomorrow. Please arrange transport for pt first thing and contact Melissa at [phone].  TOC will continue to follow   Barriers to Discharge: No Barriers Identified  Expected Discharge Plan and Services           Expected Discharge Date: 03/13/20                                     Social Determinants of Health (SDOH) Interventions    Readmission Risk Interventions No flowsheet data found.

## 2020-03-13 NOTE — Discharge Summary (Signed)
[place] [facility] [place] NAME: Brad Graham    MR#:  [id number]  DATE OF BIRTH:  1961/01/04  DATE OF ADMISSION:  03/02/2020 ADMITTING PHYSICIAN: Earleen Newport, MD  DATE OF DISCHARGE: 03/13/2020  PRIMARY CARE PHYSICIAN: Carnella Guadalajara I, NP    ADMISSION DIAGNOSIS:  Cardiac arrest ([place]) [I46.9] Cardiopulmonary arrest ([place]) [I46.9] Acidemia [E87.2] Encounter for intubation [Z01.818] Acute renal failure, unspecified acute renal failure type ([place]) [N17.9]  DISCHARGE DIAGNOSIS:  Acute hypoxic hyper cardiac respiratory failure status post PEA cardiac arrest requiring mechanical intubation Aspiration pneumonia completed antibiotic Seizure disorder Acute kidney injury complicated by metabolic acidosis and hyperkalemia resolved Acute metabolic encephalopathy improved Type II diabetes  SECONDARY DIAGNOSIS:   Past Medical History:  Diagnosis Date  . Alcohol abuse   . Arthritis   . H/O: CVA (cerebrovascular accident)   . Hyperlipidemia   . Hypertension   . Syncope 07/02/2016  . Syncope and collapse 01/27/2020    HOSPITAL COURSE:  Brad A Baldwinis a 59 y.o.malewith a PMH of Syncopal Episode, HTN, HLD, CVA,Recently Discharged from [facility] 01/28/2020 following treatment of unresponsivenesssecondary to seizuresctivity suspecteddueto ETOH withdrawal (pt was not discharged on anticonvulsants and pt refused MRI Brain during hospitalization), Arthritis, and Severe ETOH Abuse. He presented to [facility] ER from a [place] via EMS on 06/9 with altered mental status  *Acute hypoxemic and hypercapnic respiratory failure, s/p PEA cardiac arrest, aspiration pneumonia: -S/p extubation on 03/04/2020 -hemodynamically stable  *Seizure disorder -EEG did not show any evidence of seizures so epileptiform discharges but it was suggestive of moderate diffuse encephalopathy. - Continue po Dilantin. Follow-up with neurologist -check dilantin levels  periodically  *Aspiration pneumonia, coagulase-negative staph bacteremia in 1 bottle: -now on oral Augmentin--completed course -CONSin 1 blood culture bottle is a contaminant per ID. -Speech therapist recommended mechanical soft diet for dysphagia  *AKI complicated by metabolic acidosis and hyperkalemia:Resolved.  H/o chronic hyponatremia--mentation at baseline  *Acute metabolic encephalopathy: Continue supportive care  *Type 2 diabetes mellitus: NovoLog as needed for hyperglycemia Resumed metformin Cont sugar checks at the facility and adjust po meds accordingly  *Thrombocytopenia: Platelet count is improving continue to monitor.  *HTN Cont amlodipine, hydralazine and coreg  *Alcohol use disorder -MVI, thiamine and folate  *Body mass index is 35.73 kg/m.(Morbid obesity)    DVT prophylaxis: SCD Code Status:DNR Family Communication:uncle larry aware--spoke yday Disposition Plan: [facility] rehab   Status is: Inpatient   Dispo:  patient is from: Group home Anticipated d[place]c is [facility], [employer] Anticipated d/c date is: medically stable at present. Patient currently is medically stable to d/c.  CONSULTS OBTAINED:  Treatment Team:  Thayer Headings, MD Leotis Pain, MD  DRUG ALLERGIES:  No Known Allergies  DISCHARGE MEDICATIONS:   Allergies as of 03/13/2020   No Known Allergies     Medication List    TAKE these medications   amLODipine 10 MG tablet Commonly known as: NORVASC Take 1 tablet (10 mg total) by mouth daily.   aspirin EC 81 MG tablet Take 1 tablet (81 mg total) by mouth daily.   atorvastatin 20 MG tablet Commonly known as: LIPITOR Take 1 tablet (20 mg total) by mouth daily at 6 PM.   blood glucose meter kit and supplies Kit Dispense based on patient and insurance preference. Use up to four times daily as directed. (FOR ICD-9 250.00, 250.01).   carvedilol 25 MG  tablet Commonly known as: COREG Take 1 tablet (25 mg total) by mouth  2 (two) times daily with a meal.   folic acid 1 MG tablet Commonly known as: FOLVITE Take 1 tablet (1 mg total) by mouth daily.   hydrALAZINE 25 MG tablet Commonly known as: APRESOLINE Take 1 tablet (25 mg total) by mouth 2 (two) times daily. What changed: when to take this   magnesium oxide 400 (241.3 Mg) MG tablet Commonly known as: MAG-OX Take 1 tablet (400 mg total) by mouth 2 (two) times daily.   metFORMIN 1000 MG tablet Commonly known as: Glucophage Take 1 tablet (1,000 mg total) by mouth daily with breakfast.   multivitamin with minerals Tabs tablet Take 1 tablet by mouth daily.   phenytoin 100 MG ER capsule Commonly known as: DILANTIN Take 300 mg at bedtime and 200 mg at daytime   tamsulosin 0.4 MG Caps capsule Commonly known as: FLOMAX Take 1 capsule (0.4 mg total) by mouth daily after supper.   thiamine 100 MG tablet Take 1 tablet (100 mg total) by mouth daily.            Discharge Care Instructions  (From admission, onward)         Start     Ordered   03/12/20 0000  Discharge wound care:       Comments: Dressing procedure/placement/frequency: Barrier cream twice daily and PRN soilage over the buttocks   03/12/20 1040          If you experience worsening of your admission symptoms, develop shortness of breath, life threatening emergency, suicidal or homicidal thoughts you must seek medical attention immediately by calling 911 or calling your MD immediately  if symptoms less severe.  You Must read complete instructions/literature along with all the possible adverse reactions/side effects for all the Medicines you take and that have been prescribed to you. Take any new Medicines after you have completely understood and accept all the possible adverse reactions/side effects.   Please note  You were cared for by a hospitalist during your hospital stay. If you have any questions about  your discharge medications or the care you received while you were in the hospital after you are discharged, you can call the unit and asked to speak with the hospitalist on call if the hospitalist that took care of you is not available. Once you are discharged, your primary care physician will handle any further medical issues. Please note that NO REFILLS for any discharge medications will be authorized once you are discharged, as it is imperative that you return to your primary care physician (or establish a relationship with a primary care physician if you do not have one) for your aftercare needs so that they can reassess your need for medications and monitor your lab values. Today   SUBJECTIVE   Doing well  No complaints No issues per RN   VITAL SIGNS:  Blood pressure (!) 168/96, pulse 89, temperature 99.6 F (37.6 C), temperature source Oral, resp. rate (!) 22, height 6' (1.829 m), weight 119.5 kg, SpO2 93 %.  I/O:    Intake/Output Summary (Last 24 hours) at 03/13/2020 0740 Last data filed at 03/13/2020 0032 Gross per 24 hour  Intake 480 ml  Output 1500 ml  Net -1020 ml    PHYSICAL EXAMINATION:  GENERAL:  59 y.o.-year-old patient lying in the bed with no acute distress.  EYES: Pupils equal, round, reactive to light and accommodation. No scleral icterus.  HEENT: Head atraumatic, normocephalic. Oropharynx and nasopharynx clear.  .  LUNGS: Normal breath sounds bilaterally, no  wheezing, rales,rhonchi or crepitation. No use of accessory muscles of respiration.  CARDIOVASCULAR: S1, S2 normal. No murmurs, rubs, or gallops.  ABDOMEN: Soft, non-tender, non-distended. Bowel sounds present. No organomegaly or mass.  EXTREMITIES: No pedal edema, cyanosis, or clubbing.  NEUROLOGIC: gen weakness, Mild dysarthria PSYCHIATRIC:  patient is alert , mood and affect normal.  SKIN:Wound type:MASD Pressure Injury POA: NA Measurement: 2 cm linear breakdown to apex of gluteal cleft   DATA REVIEW:    CBC  Recent Labs  Lab 03/08/20 0509  WBC 5.7  HGB 10.1*  HCT 29.9*  PLT 134*    Chemistries  Recent Labs  Lab 03/08/20 0509 03/09/20 0811 03/13/20 0331  NA 133*   < > 128*  K 4.0   < > 3.9  CL 98   < > 93*  CO2 25   < > 24  GLUCOSE 153*   < > 118*  BUN 15   < > 12  CREATININE 1.04   < > 0.90  CALCIUM 8.1*   < > 8.9  MG 1.7   < > 1.6*  AST 39  --   --   ALT 13  --   --   ALKPHOS 79  --   --   BILITOT 0.8  --   --    < > = values in this interval not displayed.    Microbiology Results   No results found for this or any previous visit (from the past 240 hour(s)).  RADIOLOGY:  No results found.   CODE STATUS:     Code Status Orders  (From admission, onward)         Start     Ordered   03/03/20 0845  Do not attempt resuscitation (DNR)  Continuous       Question Answer Comment  In the event of cardiac or respiratory ARREST Do not call a "code blue"   In the event of cardiac or respiratory ARREST Do not perform Intubation, CPR, defibrillation or ACLS   In the event of cardiac or respiratory ARREST Use medication by any route, position, wound care, and other measures to relive pain and suffering. May use oxygen, suction and manual treatment of airway obstruction as needed for comfort.      03/03/20 0844        Code Status History    Date Active Date Inactive Code Status Order ID Comments User Context   03/02/2020 2055 03/03/2020 0844 Full Code [id number]  Awilda Bill, NP ED   01/27/2020 1815 01/29/2020 0006 Full Code [id number]  [place], [place], DO ED   08/02/2016 2342 08/04/2016 1559 Full Code [id number]  Harvie Bridge, DO Inpatient   07/02/2016 1354 07/03/2016 1903 Full Code [id number]  Dustin Flock, MD ED   Advance Care Planning Activity       TOTAL TIME TAKING CARE OF THIS PATIENT: 40  minutes.    Fritzi Mandes M.D  [facility]    CC: Primary care physician; Helayne Seminole, NP

## 2020-03-14 LAB — SARS CORONAVIRUS 2 BY RT PCR (HOSPITAL ORDER, PERFORMED IN ~~LOC~~ HOSPITAL LAB): SARS Coronavirus 2: NEGATIVE

## 2020-03-14 LAB — GLUCOSE, CAPILLARY
Glucose-Capillary: 132 mg/dL — ABNORMAL HIGH (ref 70–99)
Glucose-Capillary: 129 mg/dL — ABNORMAL HIGH (ref 70–99)

## 2020-03-14 LAB — BASIC METABOLIC PANEL
Anion gap: 9 (ref 5–15)
BUN: 12 mg/dL (ref 6–20)
CO2: 26 mmol/L (ref 22–32)
Calcium: 9.1 mg/dL (ref 8.9–10.3)
Chloride: 95 mmol/L — ABNORMAL LOW (ref 98–111)
Creatinine, Ser: 0.88 mg/dL (ref 0.61–1.24)
GFR calc Af Amer: >60 mL/min (ref >60)
GFR calc non Af Amer: >60 mL/min (ref >60)
Glucose, Bld: 145 mg/dL — ABNORMAL HIGH (ref 70–99)
Potassium: 4.1 mmol/L (ref 3.5–5.1)
Sodium: 130 mmol/L — ABNORMAL LOW (ref 135–145)

## 2020-03-14 LAB — MAGNESIUM: Magnesium: 1.7 mg/dL (ref 1.7–2.4)

## 2020-03-14 MED ORDER — METFORMIN HCL 500 MG PO TABS: 1000.0000 mg | ORAL_TABLET | Freq: Every day | ORAL | Status: DC

## 2020-03-14 MED ORDER — ADULT MULTIVITAMIN W/MINERALS CH
1.0000 | ORAL_TABLET | Freq: Every day | ORAL | Status: DC
  Filled 2020-03-14: qty 1

## 2020-03-14 MED ORDER — TAMSULOSIN HCL 0.4 MG PO CAPS: 0.4000 mg | ORAL_CAPSULE | Freq: Every day | ORAL | Status: DC

## 2020-03-14 NOTE — Discharge Summary (Signed)
[facility] [place] NAME: Brad Graham    MR#:  [id number]  DATE OF BIRTH:  03-May-1961  DATE OF ADMISSION:  03/02/2020 ADMITTING PHYSICIAN: Brad Newport, MD  DATE OF DISCHARGE: 03/14/2020  PRIMARY CARE PHYSICIAN: Brad Guadalajara I, NP    ADMISSION DIAGNOSIS:  Cardiac arrest ([place]) [I46.9] Cardiopulmonary arrest ([place]) [I46.9] Acidemia [E87.2] Encounter for intubation [Z01.818] Acute renal failure, unspecified acute renal failure type ([place]) [N17.9]  DISCHARGE DIAGNOSIS:  Acute hypoxic hyper cardiac respiratory failure status post PEA cardiac arrest requiring mechanical intubation Aspiration pneumonia completed antibiotic Seizure disorder Acute kidney injury complicated by metabolic acidosis and hyperkalemia resolved Acute metabolic encephalopathy improved Type II diabetes  SECONDARY DIAGNOSIS:   Past Medical History:  Diagnosis Date  . Alcohol abuse   . Arthritis   . H/O: CVA (cerebrovascular accident)   . Hyperlipidemia   . Hypertension   . Syncope 07/02/2016  . Syncope and collapse 01/27/2020    HOSPITAL COURSE:  Brad A Baldwinis a 59 y.o.malewith a PMH of Syncopal Episode, HTN, HLD, CVA,Recently Discharged from [facility] 01/28/2020 following treatment of unresponsivenesssecondary to seizuresctivity suspecteddueto ETOH withdrawal (pt was not discharged on anticonvulsants and pt refused MRI Brain during hospitalization), Arthritis, and Severe ETOH Abuse. He presented to [facility] ER from a [place] via EMS on 06/9 with altered mental status  *Acute hypoxemic and hypercapnic respiratory failure, s/p PEA cardiac arrest, aspiration pneumonia: -S/p extubation on 03/04/2020 -hemodynamically stable  *Seizure disorder -EEG did not show any evidence of seizures so epileptiform discharges but it was suggestive of moderate diffuse encephalopathy. - Continue po Dilantin. Follow-up with neurologist -check dilantin levels  periodically  *Aspiration pneumonia, coagulase-negative staph bacteremia in 1 bottle: -now on oral Augmentin--completed course -CONSin 1 blood culture bottle is a contaminant per ID. -Speech therapist recommended mechanical soft diet for dysphagia  *AKI complicated by metabolic acidosis and hyperkalemia:Resolved.  H/o chronic hyponatremia--mentation at baseline  *Acute metabolic encephalopathy: Continue supportive care  *Type 2 diabetes mellitus: NovoLog as needed for hyperglycemia Resumed metformin Cont sugar checks at the facility and adjust po meds accordingly  *Thrombocytopenia: Platelet count is improving continue to monitor.  *HTN Cont amlodipine, hydralazine and coreg  *Alcohol use disorder -MVI, thiamine and folate  *Body mass index is 35.73 kg/m.(Morbid obesity)    DVT prophylaxis: SCD Code Status:DNR Family Communication:uncle larry aware--spoke yday Disposition Plan: [facility] rehab   Status is: Inpatient   Dispo:  patient is from: Group home Anticipated d/c is [place]:[facility] [employer] Anticipated d/c date is: medically stable at present. Patient currently is medically stable to d/c. Repeat COVID ordered  CONSULTS OBTAINED:  Treatment Team:  Thayer Headings, MD Leotis Pain, MD  DRUG ALLERGIES:  No Known Allergies  DISCHARGE MEDICATIONS:   Allergies as of 03/14/2020   No Known Allergies     Medication List    TAKE these medications   amLODipine 10 MG tablet Commonly known as: NORVASC Take 1 tablet (10 mg total) by mouth daily.   aspirin EC 81 MG tablet Take 1 tablet (81 mg total) by mouth daily.   atorvastatin 20 MG tablet Commonly known as: LIPITOR Take 1 tablet (20 mg total) by mouth daily at 6 PM.   blood glucose meter kit and supplies Kit Dispense based on patient and insurance preference. Use up to four times daily as directed. (FOR ICD-9 250.00, 250.01).    carvedilol 25 MG tablet Commonly known as: COREG Take 1 tablet (25 mg  total) by mouth 2 (two) times daily with a meal.   folic acid 1 MG tablet Commonly known as: FOLVITE Take 1 tablet (1 mg total) by mouth daily.   hydrALAZINE 25 MG tablet Commonly known as: APRESOLINE Take 1 tablet (25 mg total) by mouth 2 (two) times daily. What changed: when to take this   magnesium oxide 400 (241.3 Mg) MG tablet Commonly known as: MAG-OX Take 1 tablet (400 mg total) by mouth 2 (two) times daily.   metFORMIN 1000 MG tablet Commonly known as: Glucophage Take 1 tablet (1,000 mg total) by mouth daily with breakfast.   multivitamin with minerals Tabs tablet Take 1 tablet by mouth daily.   phenytoin 100 MG ER capsule Commonly known as: DILANTIN Take 300 mg at bedtime and 200 mg at daytime   tamsulosin 0.4 MG Caps capsule Commonly known as: FLOMAX Take 1 capsule (0.4 mg total) by mouth daily after supper.   thiamine 100 MG tablet Take 1 tablet (100 mg total) by mouth daily.            Discharge Care Instructions  (From admission, onward)         Start     Ordered   03/12/20 0000  Discharge wound care:       Comments: Dressing procedure/placement/frequency: Barrier cream twice daily and PRN soilage over the buttocks   03/12/20 1040          If you experience worsening of your admission symptoms, develop shortness of breath, life threatening emergency, suicidal or homicidal thoughts you must seek medical attention immediately by calling 911 or calling your MD immediately  if symptoms less severe.  You Must read complete instructions/literature along with all the possible adverse reactions/side effects for all the Medicines you take and that have been prescribed to you. Take any new Medicines after you have completely understood and accept all the possible adverse reactions/side effects.   Please note  You were cared for by a hospitalist during your hospital stay. If you have  any questions about your discharge medications or the care you received while you were in the hospital after you are discharged, you can call the unit and asked to speak with the hospitalist on call if the hospitalist that took care of you is not available. Once you are discharged, your primary care physician will handle any further medical issues. Please note that NO REFILLS for any discharge medications will be authorized once you are discharged, as it is imperative that you return to your primary care physician (or establish a relationship with a primary care physician if you do not have one) for your aftercare needs so that they can reassess your need for medications and monitor your lab values. Today   SUBJECTIVE   Doing well  No complaints No issues per RN   VITAL SIGNS:  Blood pressure (!) 157/94, pulse 93, temperature 99.8 F (37.7 C), temperature source Oral, resp. rate 18, height 6' (1.829 m), weight 119.5 kg, SpO2 91 %.  Graham/O:    Intake/Output Summary (Last 24 hours) at 03/14/2020 0936 Last data filed at 03/14/2020 0439 Gross per 24 hour  Intake --  Output 700 ml  Net -700 ml    PHYSICAL EXAMINATION:  GENERAL:  59 y.o.-year-old patient lying in the bed with no acute distress.  EYES: Pupils equal, round, reactive to light and accommodation. No scleral icterus.  HEENT: Head atraumatic, normocephalic. Oropharynx and nasopharynx clear.  .  LUNGS: Normal breath sounds bilaterally,  no wheezing, rales,rhonchi or crepitation. No use of accessory muscles of respiration.  CARDIOVASCULAR: S1, S2 normal. No murmurs, rubs, or gallops.  ABDOMEN: Soft, non-tender, non-distended. Bowel sounds present. No organomegaly or mass.  EXTREMITIES: No pedal edema, cyanosis, or clubbing.  NEUROLOGIC: gen weakness, Mild dysarthria PSYCHIATRIC:  patient is alert , mood and affect normal.  SKIN:Wound type:MASD Pressure Injury POA: NA Measurement: 2 cm linear breakdown to apex of gluteal cleft    DATA REVIEW:   CBC  Recent Labs  Lab 03/08/20 0509  WBC 5.7  HGB 10.1*  HCT 29.9*  PLT 134*    Chemistries  Recent Labs  Lab 03/08/20 0509 03/09/20 0811 03/14/20 0528  NA 133*   < > 130*  K 4.0   < > 4.1  CL 98   < > 95*  CO2 25   < > 26  GLUCOSE 153*   < > 145*  BUN 15   < > 12  CREATININE 1.04   < > 0.88  CALCIUM 8.1*   < > 9.1  MG 1.7   < > 1.7  AST 39  --   --   ALT 13  --   --   ALKPHOS 79  --   --   BILITOT 0.8  --   --    < > = values in this interval not displayed.    Microbiology Results   No results found for this or any previous visit (from the past 240 hour(s)).  RADIOLOGY:  No results found.   CODE STATUS:     Code Status Orders  (From admission, onward)         Start     Ordered   03/03/20 0845  Do not attempt resuscitation (DNR)  Continuous       Question Answer Comment  In the event of cardiac or respiratory ARREST Do not call a "code blue"   In the event of cardiac or respiratory ARREST Do not perform Intubation, CPR, defibrillation or ACLS   In the event of cardiac or respiratory ARREST Use medication by any route, position, wound care, and other measures to relive pain and suffering. May use oxygen, suction and manual treatment of airway obstruction as needed for comfort.      03/03/20 0844        Code Status History    Date Active Date Inactive Code Status Order ID Comments User Context   03/02/2020 2055 03/03/2020 0844 Full Code [id number]  Awilda Bill, NP ED   01/27/2020 1815 01/29/2020 0006 Full Code [id number]  [place], [place], DO ED   08/02/2016 2342 08/04/2016 1559 Full Code [id number]  Harvie Bridge, DO Inpatient   07/02/2016 1354 07/03/2016 1903 Full Code [id number]  Dustin Flock, MD ED   Advance Care Planning Activity       TOTAL TIME TAKING CARE OF THIS PATIENT: 40  minutes.    Fritzi Mandes M.D  [facility]    CC: Primary care physician; Helayne Seminole, NP

## 2020-03-14 NOTE — TOC Transition Note (Signed)
Transition of Care [facility]) - CM/SW Discharge Note   Patient Details  Name: Brad Graham MRN: [id number] Date of Birth: [birth year]  Transition of Care [facility]) CM/SW Contact:  Lucy Chris, LCSW Phone Number: 03/14/2020, 10:01 AM   Clinical Narrative:  Transportation set up via [facility] paperwork completed and pt to transfer to [facility] today. Melissa[facility] at [facility] aware of this plan. Bedside RN to call report to [phone]. COVID test back negative. Set for transfer today.    Final next level of care: Skilled Nursing Facility Barriers to Discharge: Barriers Resolved   Patient Goals and CMS Choice   CMS Medicare.gov Compare Post Acute Care list provided to:: Patient Represenative (must comment) (HCPOA)    Discharge Placement PASRR number recieved: 03/09/20            Patient chooses bed at: [facility] Patient to be transferred to facility by: EMS Name of family member notified: Rito Ehrlich Patient and family notified of of transfer: 03/14/20  Discharge Plan and Services                                     Social Determinants of Health (SDOH) Interventions     Readmission Risk Interventions No flowsheet data found.

## 2020-03-14 NOTE — TOC Progression Note (Signed)
Transition of Care [facility]) - Progression Note    Patient Details  Name: Brad Graham MRN: [id number] Date of Birth: 03-22-1961  Transition of Care [facility]) CM/SW Contact  Demarkus Remmel, Lemar Livings, LCSW Phone Number: 03/14/2020, 8:27 AM  Clinical Narrative:  Spoke with [facility] to set up transport for 12:00 hopefully COVID test results will be back. Have asked AC to order rapid COVID test for gentleman-she has and will work toward transfer today. [facility] aware of plan.     Barriers to Discharge: No Barriers Identified  Expected Discharge Plan and Services           Expected Discharge Date: 03/13/20                                     Social Determinants of Health (SDOH) Interventions    Readmission Risk Interventions No flowsheet data found.

## 2020-03-15 ENCOUNTER — Ambulatory Visit: Payer: Medicaid Other | Admitting: Nurse Practitioner

## 2020-03-30 ENCOUNTER — Other Ambulatory Visit: Payer: Self-pay

## 2020-03-30 ENCOUNTER — Encounter (HOSPITAL_COMMUNITY): Payer: Self-pay | Admitting: Emergency Medicine

## 2020-03-30 ENCOUNTER — Inpatient Hospital Stay (HOSPITAL_COMMUNITY)
Admission: EM | Admit: 2020-03-30 | Discharge: 2020-04-15 | DRG: 640 | Disposition: A | Discharge status: Skilled Nursing Facility | Payer: Medicaid Other | Source: Skilled Nursing Facility | Attending: Internal Medicine | Admitting: Internal Medicine

## 2020-03-30 ENCOUNTER — Emergency Department (HOSPITAL_COMMUNITY): Payer: Medicaid Other

## 2020-03-30 DIAGNOSIS — I5033 Acute on chronic diastolic (congestive) heart failure: Secondary | ICD-10-CM

## 2020-03-30 DIAGNOSIS — I69354 Hemiplegia and hemiparesis following cerebral infarction affecting left non-dominant side: Secondary | ICD-10-CM

## 2020-03-30 DIAGNOSIS — R600 Localized edema: Secondary | ICD-10-CM | POA: Diagnosis present

## 2020-03-30 DIAGNOSIS — Z66 Do not resuscitate: Secondary | ICD-10-CM | POA: Diagnosis present

## 2020-03-30 DIAGNOSIS — F1010 Alcohol abuse, uncomplicated: Secondary | ICD-10-CM | POA: Diagnosis present

## 2020-03-30 DIAGNOSIS — Z6833 Body mass index (BMI) 33.0-33.9, adult: Secondary | ICD-10-CM

## 2020-03-30 DIAGNOSIS — G8191 Hemiplegia, unspecified affecting right dominant side: Secondary | ICD-10-CM | POA: Diagnosis not present

## 2020-03-30 DIAGNOSIS — I469 Cardiac arrest, cause unspecified: Secondary | ICD-10-CM | POA: Diagnosis present

## 2020-03-30 DIAGNOSIS — G40409 Other generalized epilepsy and epileptic syndromes, not intractable, without status epilepticus: Secondary | ICD-10-CM | POA: Diagnosis present

## 2020-03-30 DIAGNOSIS — Z7984 Long term (current) use of oral hypoglycemic drugs: Secondary | ICD-10-CM

## 2020-03-30 DIAGNOSIS — E1151 Type 2 diabetes mellitus with diabetic peripheral angiopathy without gangrene: Secondary | ICD-10-CM | POA: Diagnosis present

## 2020-03-30 DIAGNOSIS — I639 Cerebral infarction, unspecified: Secondary | ICD-10-CM | POA: Diagnosis not present

## 2020-03-30 DIAGNOSIS — D638 Anemia in other chronic diseases classified elsewhere: Secondary | ICD-10-CM | POA: Diagnosis present

## 2020-03-30 DIAGNOSIS — E1165 Type 2 diabetes mellitus with hyperglycemia: Secondary | ICD-10-CM | POA: Diagnosis present

## 2020-03-30 DIAGNOSIS — E871 Hypo-osmolality and hyponatremia: Secondary | ICD-10-CM | POA: Diagnosis present

## 2020-03-30 DIAGNOSIS — Z9981 Dependence on supplemental oxygen: Secondary | ICD-10-CM | POA: Diagnosis not present

## 2020-03-30 DIAGNOSIS — I110 Hypertensive heart disease with heart failure: Secondary | ICD-10-CM | POA: Diagnosis present

## 2020-03-30 DIAGNOSIS — Z7982 Long term (current) use of aspirin: Secondary | ICD-10-CM | POA: Diagnosis not present

## 2020-03-30 DIAGNOSIS — R809 Proteinuria, unspecified: Secondary | ICD-10-CM | POA: Diagnosis present

## 2020-03-30 DIAGNOSIS — Z79899 Other long term (current) drug therapy: Secondary | ICD-10-CM

## 2020-03-30 DIAGNOSIS — N390 Urinary tract infection, site not specified: Secondary | ICD-10-CM | POA: Diagnosis present

## 2020-03-30 DIAGNOSIS — E669 Obesity, unspecified: Secondary | ICD-10-CM | POA: Diagnosis present

## 2020-03-30 DIAGNOSIS — E46 Unspecified protein-calorie malnutrition: Secondary | ICD-10-CM | POA: Diagnosis present

## 2020-03-30 DIAGNOSIS — E876 Hypokalemia: Secondary | ICD-10-CM | POA: Diagnosis present

## 2020-03-30 DIAGNOSIS — R000 Tachycardia, unspecified: Secondary | ICD-10-CM | POA: Diagnosis not present

## 2020-03-30 DIAGNOSIS — B9620 Unspecified Escherichia coli [E. coli] as the cause of diseases classified elsewhere: Secondary | ICD-10-CM | POA: Diagnosis present

## 2020-03-30 DIAGNOSIS — Z20822 Contact with and (suspected) exposure to covid-19: Secondary | ICD-10-CM | POA: Diagnosis present

## 2020-03-30 DIAGNOSIS — G9341 Metabolic encephalopathy: Secondary | ICD-10-CM | POA: Diagnosis present

## 2020-03-30 DIAGNOSIS — E785 Hyperlipidemia, unspecified: Secondary | ICD-10-CM | POA: Diagnosis present

## 2020-03-30 DIAGNOSIS — R0989 Other specified symptoms and signs involving the circulatory and respiratory systems: Secondary | ICD-10-CM

## 2020-03-30 DIAGNOSIS — R739 Hyperglycemia, unspecified: Secondary | ICD-10-CM | POA: Diagnosis present

## 2020-03-30 DIAGNOSIS — G9340 Encephalopathy, unspecified: Secondary | ICD-10-CM | POA: Diagnosis not present

## 2020-03-30 DIAGNOSIS — I10 Essential (primary) hypertension: Secondary | ICD-10-CM

## 2020-03-30 DIAGNOSIS — R066 Hiccough: Secondary | ICD-10-CM | POA: Diagnosis present

## 2020-03-30 DIAGNOSIS — R509 Fever, unspecified: Secondary | ICD-10-CM

## 2020-03-30 DIAGNOSIS — R569 Unspecified convulsions: Secondary | ICD-10-CM | POA: Diagnosis not present

## 2020-03-30 DIAGNOSIS — I6330 Cerebral infarction due to thrombosis of unspecified cerebral artery: Secondary | ICD-10-CM | POA: Insufficient documentation

## 2020-03-30 DIAGNOSIS — N3000 Acute cystitis without hematuria: Secondary | ICD-10-CM | POA: Diagnosis not present

## 2020-03-30 DIAGNOSIS — F17210 Nicotine dependence, cigarettes, uncomplicated: Secondary | ICD-10-CM | POA: Diagnosis present

## 2020-03-30 DIAGNOSIS — E782 Mixed hyperlipidemia: Secondary | ICD-10-CM | POA: Diagnosis not present

## 2020-03-30 DIAGNOSIS — E8770 Fluid overload, unspecified: Secondary | ICD-10-CM | POA: Diagnosis not present

## 2020-03-30 DIAGNOSIS — R29810 Facial weakness: Secondary | ICD-10-CM | POA: Diagnosis not present

## 2020-03-30 DIAGNOSIS — E119 Type 2 diabetes mellitus without complications: Secondary | ICD-10-CM

## 2020-03-30 DIAGNOSIS — I361 Nonrheumatic tricuspid (valve) insufficiency: Secondary | ICD-10-CM | POA: Diagnosis not present

## 2020-03-30 HISTORY — DX: Other generalized epilepsy and epileptic syndromes, not intractable, without status epilepticus: G40.409

## 2020-03-30 HISTORY — DX: Alcohol abuse, uncomplicated: F10.10

## 2020-03-30 HISTORY — DX: Hemiplegia and hemiparesis following cerebral infarction affecting left non-dominant side: I69.354

## 2020-03-30 HISTORY — DX: Type 2 diabetes mellitus without complications: E11.9

## 2020-03-30 LAB — SODIUM
Sodium: 109 mmol/L — CL (ref 135–145)
Sodium: 111 mmol/L — CL (ref 135–145)
Sodium: 111 mmol/L — CL (ref 135–145)
Sodium: 110 mmol/L — CL (ref 135–145)

## 2020-03-30 LAB — CBC
HCT: 28.5 % — ABNORMAL LOW (ref 39.0–52.0)
Hemoglobin: 9.8 g/dL — ABNORMAL LOW (ref 13.0–17.0)
MCH: 27.8 pg (ref 26.0–34.0)
MCHC: 34.4 g/dL (ref 30.0–36.0)
MCV: 81 fL (ref 80.0–100.0)
Platelets: 244 10*3/uL (ref 150–400)
RBC: 3.52 MIL/uL — ABNORMAL LOW (ref 4.22–5.81)
RDW: 13.2 % (ref 11.5–15.5)
WBC: 5.3 10*3/uL (ref 4.0–10.5)
nRBC: 0 % (ref 0.0–0.2)

## 2020-03-30 LAB — URINALYSIS, ROUTINE W REFLEX MICROSCOPIC
Bacteria, UA: NONE SEEN
Bilirubin Urine: NEGATIVE
Glucose, UA: NEGATIVE mg/dL
Ketones, ur: 20 mg/dL — AB
Nitrite: NEGATIVE
Protein, ur: NEGATIVE mg/dL
Specific Gravity, Urine: 1.009 (ref 1.005–1.030)
WBC, UA: >50 WBC/hpf — ABNORMAL HIGH (ref 0–5)
pH: 5 (ref 5.0–8.0)

## 2020-03-30 LAB — COMPREHENSIVE METABOLIC PANEL
ALT: 13 U/L (ref 0–44)
AST: 17 U/L (ref 15–41)
Albumin: 2.7 g/dL — ABNORMAL LOW (ref 3.5–5.0)
Alkaline Phosphatase: 172 U/L — ABNORMAL HIGH (ref 38–126)
Anion gap: 13 (ref 5–15)
BUN: 5 mg/dL — ABNORMAL LOW (ref 6–20)
CO2: 22 mmol/L (ref 22–32)
Calcium: 8.3 mg/dL — ABNORMAL LOW (ref 8.9–10.3)
Chloride: 74 mmol/L — ABNORMAL LOW (ref 98–111)
Creatinine, Ser: 0.74 mg/dL (ref 0.61–1.24)
GFR calc Af Amer: >60 mL/min (ref >60)
GFR calc non Af Amer: >60 mL/min (ref >60)
Glucose, Bld: 90 mg/dL (ref 70–99)
Potassium: 3.5 mmol/L (ref 3.5–5.1)
Sodium: 109 mmol/L — CL (ref 135–145)
Total Bilirubin: 0.8 mg/dL (ref 0.3–1.2)
Total Protein: 6.9 g/dL (ref 6.5–8.1)

## 2020-03-30 LAB — VITAMIN D 25 HYDROXY (VIT D DEFICIENCY, FRACTURES): Vit D, 25-Hydroxy: 11.8 ng/mL — ABNORMAL LOW (ref 30–100)

## 2020-03-30 LAB — RAPID URINE DRUG SCREEN, HOSP PERFORMED
Amphetamines: NOT DETECTED
Barbiturates: NOT DETECTED
Benzodiazepines: POSITIVE — AB
Cocaine: NOT DETECTED
Opiates: NOT DETECTED
Tetrahydrocannabinol: NOT DETECTED

## 2020-03-30 LAB — SARS CORONAVIRUS 2 BY RT PCR (HOSPITAL ORDER, PERFORMED IN ~~LOC~~ HOSPITAL LAB): SARS Coronavirus 2: NEGATIVE

## 2020-03-30 LAB — GLUCOSE, CAPILLARY: Glucose-Capillary: 81 mg/dL (ref 70–99)

## 2020-03-30 LAB — SODIUM, URINE, RANDOM: Sodium, Ur: 38 mmol/L

## 2020-03-30 LAB — TSH: TSH: 2.642 u[IU]/mL (ref 0.350–4.500)

## 2020-03-30 LAB — MRSA PCR SCREENING: MRSA by PCR: NEGATIVE

## 2020-03-30 LAB — CBG MONITORING, ED: Glucose-Capillary: 90 mg/dL (ref 70–99)

## 2020-03-30 LAB — ETHANOL: Alcohol, Ethyl (B): <10 mg/dL (ref <10)

## 2020-03-30 LAB — OSMOLALITY, URINE: Osmolality, Ur: 301 mOsm/kg (ref 300–900)

## 2020-03-30 LAB — OSMOLALITY: Osmolality: 230 mOsm/kg — CL (ref 275–295)

## 2020-03-30 LAB — PHENYTOIN LEVEL, TOTAL: Phenytoin Lvl: 12 ug/mL (ref 10.0–20.0)

## 2020-03-30 MED ORDER — FUROSEMIDE 10 MG/ML IJ SOLN
40.0000 mg | Freq: Once | INTRAMUSCULAR | Status: AC
  Administered 2020-03-30: 40 mg via INTRAVENOUS
  Filled 2020-03-30: qty 4

## 2020-03-30 MED ORDER — SODIUM CHLORIDE 0.9 % IV SOLN
200.0000 mg | Freq: Every day | INTRAVENOUS | Status: DC
  Filled 2020-03-30: qty 4

## 2020-03-30 MED ORDER — CHLORHEXIDINE GLUCONATE CLOTH 2 % EX PADS
6.0000 | MEDICATED_PAD | Freq: Every day | CUTANEOUS | Status: DC
  Administered 2020-03-31 – 2020-04-15 (×15): 6 via TOPICAL

## 2020-03-30 MED ORDER — ONDANSETRON HCL 4 MG PO TABS: 4.0000 mg | ORAL_TABLET | Freq: Four times a day (QID) | ORAL | Status: DC | PRN

## 2020-03-30 MED ORDER — INSULIN ASPART 100 UNIT/ML ~~LOC~~ SOLN
0.0000 [IU] | Freq: Four times a day (QID) | SUBCUTANEOUS | Status: DC
  Administered 2020-04-01: 2 [IU] via SUBCUTANEOUS
  Administered 2020-04-02 (×2): 1 [IU] via SUBCUTANEOUS

## 2020-03-30 MED ORDER — PANTOPRAZOLE SODIUM 40 MG IV SOLR
40.0000 mg | INTRAVENOUS | Status: DC
  Administered 2020-03-30 – 2020-04-03 (×5): 40 mg via INTRAVENOUS
  Filled 2020-03-30 (×5): qty 40

## 2020-03-30 MED ORDER — ACETAMINOPHEN 650 MG RE SUPP: 650.0000 mg | Freq: Four times a day (QID) | RECTAL | Status: DC | PRN

## 2020-03-30 MED ORDER — SODIUM CHLORIDE 0.9 % IV SOLN
1.0000 g | INTRAVENOUS | Status: AC
  Administered 2020-03-30 – 2020-04-02 (×4): 1 g via INTRAVENOUS
  Filled 2020-03-30 (×4): qty 10

## 2020-03-30 MED ORDER — SODIUM CHLORIDE 0.9 % IV SOLN
250.0000 mg | Freq: Two times a day (BID) | INTRAVENOUS | Status: DC
  Administered 2020-03-30 – 2020-04-01 (×4): 250 mg via INTRAVENOUS
  Filled 2020-03-30 (×12): qty 5

## 2020-03-30 MED ORDER — SODIUM CHLORIDE 3 % IV SOLN
INTRAVENOUS | Status: DC
  Filled 2020-03-30: qty 500

## 2020-03-30 MED ORDER — SODIUM CHLORIDE 3 % IV SOLN
INTRAVENOUS | Status: AC
  Filled 2020-03-30: qty 500

## 2020-03-30 MED ORDER — ONDANSETRON HCL 4 MG/2ML IJ SOLN
4.0000 mg | Freq: Four times a day (QID) | INTRAMUSCULAR | Status: DC | PRN
  Administered 2020-03-31 – 2020-04-01 (×2): 4 mg via INTRAVENOUS
  Filled 2020-03-30 (×2): qty 2

## 2020-03-30 MED ORDER — ENOXAPARIN SODIUM 60 MG/0.6ML ~~LOC~~ SOLN
55.0000 mg | SUBCUTANEOUS | Status: DC
  Administered 2020-03-30 – 2020-04-14 (×16): 55 mg via SUBCUTANEOUS
  Filled 2020-03-30 (×17): qty 0.6

## 2020-03-30 MED ORDER — SODIUM CHLORIDE 3 % IV SOLN
INTRAVENOUS | Status: DC
Start: 2020-03-30 — End: 2020-03-30

## 2020-03-30 MED ORDER — KETOROLAC TROMETHAMINE 15 MG/ML IJ SOLN: 15.0000 mg | Freq: Four times a day (QID) | INTRAMUSCULAR | Status: DC | PRN

## 2020-03-30 MED ORDER — SODIUM CHLORIDE 0.9 % IV SOLN
300.0000 mg | Freq: Every day | INTRAVENOUS | Status: DC
  Filled 2020-03-30: qty 6

## 2020-03-30 MED ORDER — SODIUM CHLORIDE 3 % IV SOLN
INTRAVENOUS | Status: AC
  Filled 2020-03-30 (×2): qty 500

## 2020-03-30 MED ORDER — METOPROLOL TARTRATE 5 MG/5ML IV SOLN
5.0000 mg | Freq: Four times a day (QID) | INTRAVENOUS | Status: DC
  Administered 2020-03-30 – 2020-03-31 (×4): 5 mg via INTRAVENOUS
  Filled 2020-03-30 (×4): qty 5

## 2020-03-30 MED ORDER — ACETAMINOPHEN 325 MG PO TABS
650.0000 mg | ORAL_TABLET | Freq: Four times a day (QID) | ORAL | Status: DC | PRN
  Administered 2020-04-05: 650 mg via ORAL
  Filled 2020-03-30: qty 2

## 2020-03-30 MED ORDER — SODIUM CHLORIDE 3 % IV SOLN
INTRAVENOUS | Status: DC
  Filled 2020-03-30 (×2): qty 500

## 2020-03-30 NOTE — ED Provider Notes (Signed)
[facility] Provider Note   CSN: [id number] Arrival date & time: 03/30/20  9622     History Chief Complaint  Patient presents with  . Altered Mental Status    Brad Graham is a 59 y.o. male. Level 5 caveat due to altered mental status. HPI Patient presents with altered mental status.  Reportedly normal last night which she is alert and oriented although chronic left-sided weakness from previous stroke.  This morning more confused.  More unresponsive for them.  For me will follow some commands and answer some questions but overall does appear somewhat confused.  Reportedly was tested and found to be hyponatremic recently.  Patient refused IV fluids but has been taking salt tablets.  However nursing home did not send this information with the patient.  Does have some large amount of swelling in his extremities.  States his legs are normally like this.  Does have history of seizures also reportedly due to alcohol abuse.  Had a cardiac arrest around a month ago at [facility].    Past Medical History:  Diagnosis Date  . Alcohol abuse   . Alcohol abuse, continuous   . Arthritis   . 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 Diabetes mellitus without complication ([place])   . H/O: CVA (cerebrovascular accident)   . Hemiparesis affecting left side as late effect of cerebrovascular accident ([place])   . Hyperlipidemia   . Hypertension   . Syncope 07/02/2016  . Syncope and collapse 01/27/2020    Patient Active Problem List   Diagnosis Date Noted  . Hyperkalemia 03/03/2020  . Metabolic acidosis 29/79/8921  . Acute encephalopathy 03/03/2020  . Cardiogenic shock ([place]) 03/03/2020  . Hyperglycemia 03/03/2020  . Acute renal failure ([place])   . Goals of care, counseling/discussion   . Palliative care by specialist   . Cardiopulmonary arrest ([place]) 03/02/2020  . Unresponsive episode 01/28/2020  . Seizure ([place]) 08/02/2016  . Essential hypertension 05/10/2015  . Diabetes mellitus  without complication ([place]) 19/41/7408  . Hyperlipidemia 05/10/2015  . Proteinuria 05/10/2015    Past Surgical History:  Procedure Laterality Date  . none         Family History  Problem Relation Age of Onset  . Diabetes Mother   . Stroke Mother   . Heart disease Maternal Grandmother   . Heart disease Maternal Grandfather     Social History   Tobacco Use  . Smoking status: Current Every Day Smoker    Packs/day: 0.50    Types: Cigarettes  . Smokeless tobacco: Never Used  Vaping Use  . Vaping Use: Never used  Substance Use Topics  . Alcohol use: Not Currently    Comment: fifth every other day  . Drug use: No    Home Medications Prior to Admission medications   Medication Sig Start Date End Date Taking? Authorizing Provider  amLODipine (NORVASC) 10 MG tablet Take 1 tablet (10 mg total) by mouth daily. 02/16/20   Carnella Guadalajara I, NP  aspirin EC 81 MG tablet Take 1 tablet (81 mg total) by mouth daily. 02/02/20   Carnella Guadalajara I, NP  atorvastatin (LIPITOR) 20 MG tablet Take 1 tablet (20 mg total) by mouth daily at 6 PM. 02/02/20   Carnella Guadalajara I, NP  blood glucose meter kit and supplies KIT Dispense based on patient and insurance preference. Use up to four times daily as directed. (FOR ICD-9 250.00, 250.01). 01/28/20   Swayze, Ava, DO  carvedilol (COREG) 25 MG tablet Take 1 tablet (  25 mg total) by mouth 2 (two) times daily with a meal. 02/02/20   Carnella Guadalajara I, NP  folic acid (FOLVITE) 1 MG tablet Take 1 tablet (1 mg total) by mouth daily. 02/02/20   Carnella Guadalajara I, NP  hydrALAZINE (APRESOLINE) 25 MG tablet Take 1 tablet (25 mg total) by mouth 2 (two) times daily. 03/12/20   Fritzi Mandes, MD  magnesium oxide (MAG-OX) 400 (241.3 Mg) MG tablet Take 1 tablet (400 mg total) by mouth 2 (two) times daily. 03/12/20   Fritzi Mandes, MD  metFORMIN (GLUCOPHAGE) 1000 MG tablet Take 1 tablet (1,000 mg total) by mouth daily with breakfast. 02/02/20 02/01/21  Carnella Guadalajara I, NP  Multiple  Vitamin (MULTIVITAMIN WITH MINERALS) TABS tablet Take 1 tablet by mouth daily. 02/02/20   Carnella Guadalajara I, NP  phenytoin (DILANTIN) 100 MG ER capsule Take 300 mg at bedtime and 200 mg at daytime 03/12/20   Fritzi Mandes, MD  tamsulosin (FLOMAX) 0.4 MG CAPS capsule Take 1 capsule (0.4 mg total) by mouth daily after supper. 01/28/20   Swayze, Ava, DO  thiamine 100 MG tablet Take 1 tablet (100 mg total) by mouth daily. 02/02/20   Carnella Guadalajara I, NP    Allergies    Patient has no known allergies.  Review of Systems   Review of Systems  Unable to perform ROS: Mental status change    Physical Exam Updated Vital Signs BP (!) 152/78   Pulse (!) 104   Temp 98.9 F (37.2 C) (Oral)   Resp 20   Ht 6' (1.829 m)   Wt 119.5 kg   SpO2 99%   BMI 35.73 kg/m   Physical Exam Vitals reviewed.  Constitutional:      Comments: Sitting in bed with eyes closed but will open and answer some questions.  Follow some commands.  Also some confusion.  HENT:     Head: Atraumatic.  Eyes:     Pupils: Pupils are equal, round, and reactive to light.  Cardiovascular:     Rate and Rhythm: Tachycardia present.  Pulmonary:     Breath sounds: No wheezing or rhonchi.  Abdominal:     Tenderness: There is no abdominal tenderness.  Musculoskeletal:     Comments: Moderate to severe pitting edema bilateral lower extremities.  Skin:    Capillary Refill: Capillary refill takes less than 2 seconds.  Neurological:     Comments: Confused but answers some questions.  Chronic left-sided weakness with some contractions now.  No seizure activity seen.     ED Results / Procedures / Treatments   Labs (all labs ordered are listed, but only abnormal results are displayed) Labs Reviewed  URINALYSIS, ROUTINE W REFLEX MICROSCOPIC - Abnormal; Notable for the following components:      Result Value   APPearance HAZY (*)    Hgb urine dipstick MODERATE (*)    Ketones, ur 20 (*)    Leukocytes,Ua LARGE (*)    WBC, UA >50 (*)     All other components within normal limits  COMPREHENSIVE METABOLIC PANEL - Abnormal; Notable for the following components:   Sodium 109 (*)    Chloride 74 (*)    BUN 5 (*)    Calcium 8.3 (*)    Albumin 2.7 (*)    Alkaline Phosphatase 172 (*)    All other components within normal limits  CBC - Abnormal; Notable for the following components:   RBC 3.52 (*)    Hemoglobin 9.8 (*)    HCT  28.5 (*)    All other components within normal limits  RAPID URINE DRUG SCREEN, HOSP PERFORMED - Abnormal; Notable for the following components:   Benzodiazepines POSITIVE (*)    All other components within normal limits  ETHANOL  PHENYTOIN LEVEL, TOTAL  CBG MONITORING, ED    EKG EKG Interpretation  Date/Time:  Wednesday March 30 2020 09:36:29 EDT Ventricular Rate:  103 PR Interval:    QRS Duration: 102 QT Interval:  365 QTC Calculation: 478 R Axis:   21 Text Interpretation: Sinus tachycardia Borderline T abnormalities, inferior leads Borderline prolonged QT interval Baseline wander in lead(s) V6 Confirmed by Davonna Belling [phone]) on 03/30/2020 11:02:04 AM   Radiology CT Head Wo Contrast  Result Date: 03/30/2020 CLINICAL DATA:  Encephalopathy. Additional provided: Elevated sodium levels, lethargic. EXAM: CT HEAD WITHOUT CONTRAST TECHNIQUE: Contiguous axial images were obtained from the base of the skull through the vertex without intravenous contrast. COMPARISON:  Head CT examination 03/03/2020 and earlier FINDINGS: Brain: The examination is mild to moderately motion degraded. Stable, mild generalized parenchymal atrophy. 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 Redemonstrated chronic infarct within the right external capsule. Unchanged moderate patchy hypodensity within the cerebral white matter which is nonspecific, but consistent with chronic small vessel ischemic disease. There is no acute intracranial hemorrhage. No demarcated cortical infarct is  identified. No extra-axial fluid collection. No evidence of intracranial mass. No midline shift. Incidentally noted cavum septum pellucidum and cavum vergae. Vascular: No hyperdense vessel.  Atherosclerotic calcifications. Skull: Normal. Negative for fracture or focal lesion. Sinuses/Orbits: Visualized orbits show no acute finding. Mild ethmoid sinus mucosal thickening. Moderate right maxillary sinus mucosal thickening. No significant mastoid effusion. IMPRESSION: Mild-to-moderate motion degradation. No evidence of acute intracranial abnormality. Stable generalized parenchymal atrophy and chronic small vessel ischemic disease. Paranasal sinus mucosal thickening as described, most notably right maxillary. Electronically Signed   By: Kellie Simmering DO   On: 03/30/2020 10:34   DG Chest Portable 1 View  Result Date: 03/30/2020 CLINICAL DATA:  Altered mental status. EXAM: PORTABLE CHEST 1 VIEW COMPARISON:  Prior chest radiograph 03/02/2020 FINDINGS: Mild cardiomegaly, unchanged. Minimal left basilar atelectasis. No appreciable airspace consolidation or frank pulmonary edema. No evidence of pleural effusion or pneumothorax. No acute bony abnormality identified. IMPRESSION: Minimal left basilar atelectasis.  Lungs otherwise clear. Mild cardiomegaly, unchanged. Electronically Signed   By: Kellie Simmering DO   On: 03/30/2020 10:36    Procedures Procedures (including critical care time)  Medications Ordered in ED Medications - No data to display  ED Course  I have reviewed the triage vital signs and the nursing notes.  Pertinent labs & imaging results that were available during my care of the patient were reviewed by me and considered in my medical decision making (see chart for details).    MDM Rules/Calculators/A&P                          Patient brought in for mental status change.  Reportedly has been dealing with hyponatremia recently but unknown level as that information did not come with the patient.   However found to be severely hyponatremic here with a sodium of 109.  Think likely acute on chronic.  However with mental status changes will need more urgent correction.  Head CT reassuring.  Urine shows white cells but no bacteria.  Will admit to hospitalist.  CRITICAL CARE Performed by: Davonna Belling Total critical care time:  30 minutes Critical care time was exclusive of separately billable procedures and treating other patients. Critical care was necessary to treat or prevent imminent or life-threatening deterioration. Critical care was time spent personally by me on the following activities: development of treatment plan with patient and/or surrogate as well as nursing, discussions with consultants, evaluation of patient's response to treatment, examination of patient, obtaining history from patient or surrogate, ordering and performing treatments and interventions, ordering and review of laboratory studies, ordering and review of radiographic studies, pulse oximetry and re-evaluation of patient's condition.  Final Clinical Impression(s) / ED Diagnoses Final diagnoses:  Hyponatremia  Encephalopathy    Rx / DC Orders ED Discharge Orders    None       Davonna Belling, MD 03/30/20 1102

## 2020-03-30 NOTE — Consult Note (Signed)
[facility] Renal Consultation Note  Requesting MD: Standley Dakins, MD Indication for Consultation:  hyponatremia  Chief complaint: AMS  HPI:  Brad Graham is a 59 y.o. male with a history including EtOH abuse, prior stroke, HTN, and DM who presented from SNF with AMS.  Found to be hyponatremic.  He's not on a diuretic per his admission med rec.  Na on consult 109 and was 111 on most recent check.  Paged while in clinic regarding this consult - seen after [place] clinic.  Patient received 3% saline at 30 ml/hr x ordered 4 hours for symptomatic hyponatremia. (he's actually been on the 3% saline for 6 hours).  He has had no observed seizure activity here.  He was on salt tablets at the SNF per med list.  During his last admission at [facility] he was admitted after PEA arrest following a grand mal seizure.  That seizure was in the setting of alcohol withdrawal per charting.  Note that his creatinine is currently normal however had a hx of AKI in 02/2020 as below. Course complicated by cardiogenic shock, resp failure requiring mechanical ventilation, and aspiration PNA.  Per his admission med rec he wasn't on a diuretic prior to that admission.  covid screen negative.  CT head no acute process.  Creatinine  Date/Time Value Ref Range Status  05/02/2014 09:21 AM 1.22 0.60 - 1.30 mg/dL Final  84/53/6468 03:21 AM 1.34 (H) 0.60 - 1.30 mg/dL Final   Creatinine, Ser  Date/Time Value Ref Range Status  03/30/2020 09:49 AM 0.74 0.61 - 1.24 mg/dL Final  22/48/2500 37:04 AM 0.88 0.61 - 1.24 mg/dL Final  88/89/1694 50:38 AM 0.90 0.61 - 1.24 mg/dL Final  88/28/0034 91:79 AM 0.98 0.61 - 1.24 mg/dL Final  15/01/6978 48:01 AM 1.11 0.61 - 1.24 mg/dL Final  65/53/7482 70:78 AM 1.24 0.61 - 1.24 mg/dL Final  67/54/4920 10:07 AM 1.04 0.61 - 1.24 mg/dL Final  09/12/7587 32:54 AM 1.10 0.61 - 1.24 mg/dL Final  98/26/4158 30:94 AM 1.36 (H) 0.61 - 1.24 mg/dL Final  07/68/0881 10:31 AM 1.50 (H)  0.61 - 1.24 mg/dL Final  59/45/8592 92:44 AM 2.21 (H) 0.61 - 1.24 mg/dL Final  62/86/3817 71:16 PM 3.35 (H) 0.61 - 1.24 mg/dL Final  57/90/3833 38:32 AM 3.57 (H) 0.61 - 1.24 mg/dL Final  91/91/6606 00:45 PM 3.62 (H) 0.61 - 1.24 mg/dL Final  99/77/4142 39:53 PM 3.93 (H) 0.61 - 1.24 mg/dL Final  20/23/3435 68:61 PM 3.95 (H) 0.61 - 1.24 mg/dL Final  68/37/2902 11:15 PM 1.49 (H) 0.76 - 1.27 mg/dL Final  52/04/222 36:12 PM 1.57 (H) 0.76 - 1.27 mg/dL Final  24/49/7530 05:11 AM 1.52 (H) 0.61 - 1.24 mg/dL Final  11/04/1733 67:01 PM 1.96 (H) 0.61 - 1.24 mg/dL Final  41/11/129 43:88 AM 1.14 0.61 - 1.24 mg/dL Final  87/57/9728 20:60 AM 1.05 0.61 - 1.24 mg/dL Final  15/61/5379 43:27 PM 1.66 (H) 0.61 - 1.24 mg/dL Final  61/47/0929 57:47 AM 1.37 (H) 0.61 - 1.24 mg/dL Final  34/11/7094 43:83 PM 2.10 (H) 0.61 - 1.24 mg/dL Final  81/84/0375 43:60 PM 2.36 (H) 0.76 - 1.27 mg/dL Final     PMHx:   Past Medical History:  Diagnosis Date  . Alcohol abuse   . Alcohol abuse, continuous   . Arthritis   . Centrencephalic epilepsy ([facility])   . Diabetes mellitus without complication ([facility])   . H/O: CVA (cerebrovascular accident)   . Hemiparesis affecting left side as late effect of cerebrovascular accident (  [facility])   . Hyperlipidemia   . Hypertension   . Syncope 07/02/2016  . Syncope and collapse 01/27/2020    Past Surgical History:  Procedure Laterality Date  . none      Family Hx:  Family History  Problem Relation Age of Onset  . Diabetes Mother   . Stroke Mother   . Heart disease Maternal Grandmother   . Heart disease Maternal Grandfather     Social History:  reports that he has been smoking cigarettes. He has been smoking about 0.50 packs per day. He has never used smokeless tobacco. He reports previous alcohol use. He reports that he does not use drugs.  Allergies: No Known Allergies  Medications: Prior to Admission medications   Medication Sig Start Date End Date Taking? Authorizing Provider   amLODipine (NORVASC) 10 MG tablet Take 1 tablet (10 mg total) by mouth daily. 02/16/20  Yes Mardene Celeste I, NP  aspirin EC 81 MG tablet Take 1 tablet (81 mg total) by mouth daily. 02/02/20  Yes Mardene Celeste I, NP  atorvastatin (LIPITOR) 20 MG tablet Take 1 tablet (20 mg total) by mouth daily at 6 PM. 02/02/20  Yes Mardene Celeste I, NP  carvedilol (COREG) 25 MG tablet Take 1 tablet (25 mg total) by mouth 2 (two) times daily with a meal. 02/02/20  Yes Mardene Celeste I, NP  Cholecalciferol (VITAMIN D) 50 MCG (2000 UT) CAPS Take 1 capsule by mouth every morning.   Yes [provider]  folic acid (FOLVITE) 1 MG tablet Take 1 tablet (1 mg total) by mouth daily. 02/02/20  Yes Mardene Celeste I, NP  hydrALAZINE (APRESOLINE) 25 MG tablet Take 1 tablet (25 mg total) by mouth 2 (two) times daily. 03/12/20  Yes Enedina Finner, MD  LORazepam (ATIVAN) 2 MG/ML injection Inject 1 mg into the muscle once as needed for seizure.   Yes [provider]  magnesium oxide (MAG-OX) 400 (241.3 Mg) MG tablet Take 1 tablet (400 mg total) by mouth 2 (two) times daily. 03/12/20  Yes Enedina Finner, MD  metFORMIN (GLUCOPHAGE) 1000 MG tablet Take 1 tablet (1,000 mg total) by mouth daily with breakfast. 02/02/20 02/01/21 Yes Mardene Celeste I, NP  OXYGEN Inhale 2 L into the lungs daily as needed.   Yes [provider]  phenytoin (DILANTIN INFATABS) 50 MG tablet Chew 200-300 mg by mouth See admin instructions. 200mg  in the morning and 300mg  at bedtime   Yes [provider]  sodium chloride 1 g tablet Take 1 g by mouth 2 (two) times daily with a meal.   Yes [provider]  tamsulosin (FLOMAX) 0.4 MG CAPS capsule Take 1 capsule (0.4 mg total) by mouth daily after supper. 01/28/20  Yes Swayze, Ava, DO  thiamine 100 MG tablet Take 1 tablet (100 mg total) by mouth daily. 02/02/20  Yes 03/29/20 I, NP    I have reviewed the patient's current and reported prior to admission medications.  Labs:  BMP  Latest Ref Rng & Units 03/30/2020 03/30/2020 03/30/2020  Glucose 70 - 99 mg/dL - - 90  BUN 6 - 20 mg/dL - - 5(L)  Creatinine 05/31/2020 - 1.24 mg/dL - - 05/31/2020  BUN/Creat Ratio 10 - 24 - - -  Sodium 135 - 145 mmol/L 111(LL) 109(LL) 109(LL)  Potassium 3.5 - 5.1 mmol/L - - 3.5  Chloride 98 - 111 mmol/L - - 74(L)  CO2 22 - 32 mmol/L - - 22  Calcium 8.9 - 10.3 mg/dL - -  8.3(L)    Urinalysis    Component Value Date/Time   COLORURINE YELLOW 03/30/2020 0949   APPEARANCEUR HAZY (A) 03/30/2020 0949   APPEARANCEUR Clear 05/02/2014 0134   LABSPEC 1.009 03/30/2020 0949   LABSPEC 1.004 05/02/2014 0134   PHURINE 5.0 03/30/2020 0949   GLUCOSEU NEGATIVE 03/30/2020 0949   GLUCOSEU Negative 05/02/2014 0134   HGBUR MODERATE (A) 03/30/2020 0949   BILIRUBINUR NEGATIVE 03/30/2020 0949   BILIRUBINUR Negative 05/02/2014 0134   KETONESUR 20 (A) 03/30/2020 0949   PROTEINUR NEGATIVE 03/30/2020 0949   NITRITE NEGATIVE 03/30/2020 0949   LEUKOCYTESUR LARGE (A) 03/30/2020 0949   LEUKOCYTESUR Negative 05/02/2014 0134     ROS:  Unable to obtain 2/2 AMS   Physical Exam: Vitals:   03/30/20 1600 03/30/20 1621  BP: (!) 166/97   Pulse: (!) 111 (!) 107  Resp: (!) 21 (!) 22  Temp:  99.9 F (37.7 C)  SpO2: 98% 99%     General: adult male in bed  HEENT:  NCAT Eyes:  EOMI sclera anicteric Neck: supple trachea midline Heart: S1S2 tachycardia; no rub Lungs: reduced but clear; unlabored on room air Abdomen: soft/NT for me/ ND Extremities: 2-3+ edema bilateral lower extremities no cyanosis or clubbing Skin: no rash on extremities exposed Neuro: mostly nonverbal; doesn't follow commands or provide hx   Assessment/Plan:  # Hyponatremia  - symptomatic and with volume overload.  Urine Na 38 and urine osm 301 - Continue the 3% saline at 30 ml/hr x 4 additional hours (ending at 10 pm tonight) - Lasix 40 mg IV once now - Check Na every 3 hours x 24 hours for now  - Contact nephrology MD on call if sodium rises to 118  or higher before 7am on 7/8 (or greater than 6 mL over 4 hours)  - Pt is NPO  - Continue ICU level monitoring - Strict ins/outs   - discontinued toradol PRN order  # AMS  - Secondary likely to hyponatremia - on 3% saline as above  - Note also UTI which team is going to treat  # Hx seizure disorder - Anti-epileptics  - continue Dilantin per primary team   # Hx EtOH abuse - Has been at SNF for two weeks per report  - Defer monitoring to primary team   # HTN  - Lasix as above and monitor for withdrawal - note he has been in a facility so would be less likely  - not able to take PO - team has ordered IV metoprolol scheduled  # UTI  - primary team is starting abx and urine culture is pending  # Normocytic anemia  - no acute indication for PRBC's. If etoh hx may have nutrient deficiency  # Hx AKI  - Cr normal now this admission - Checking post-void residual and place foley if indicated  - RBC in urine noted with concurrent UTI suspected; neg protein   Estanislado Emms, MD 03/30/2020, 6:35 PM

## 2020-03-30 NOTE — ED Notes (Addendum)
CRITICAL VALUE ALERT  Critical Value:  Sodium 109  Date & Time Notied:  03/30/20, 1012  Provider Notified: Dr. Rubin Payor  Orders Received/Actions taken: no verbal orders received.

## 2020-03-30 NOTE — H&P (Addendum)
History and Physical  [facility]  SYLUS STGERMAIN ZOX:[id number] DOB: 1961/01/10 DOA: 03/30/2020  PCP: Carnella Guadalajara I, NP  Patient coming from: [facility]   I have personally briefly reviewed patient's old medical records in [place]  Chief Complaint: altered mental status   Historian: records, ED providers, SNF (pt unable to give any history)  HPI: Brad Graham is a 59 y.o. male with medical history significant for a recent hospitalization at [facility] for cardiac arrest requiring intubation acute renal failure aspiration pneumonia who was treated in the ICU for several days and subsequently extubated and eventually discharged to [facility].  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  He has a history of chronic alcohol abuse and he went through alcohol withdrawal during last hospitalization.  He has a chronic seizure disorder and is maintained on Dilantin.  He has type 2 diabetes mellitus and hypertension.  These are treated with oral medications.  He reportedly does have a history of hyponatremia and his sodium was 130 on 03/14/2020 when he was discharged from the hospital.  His sodium levels were being monitored at the SNF reportedly and they had recommended giving him IV fluids however the patient refused and he was given oral sodium tablets.  He had been taking those reportedly during the last week.  Unfortunately his mentation continued to deteriorate and he was sent to ED for evaluation.  At baseline he is able to care for himself for the most part and normally is able to vocalize his needs.  Over the last couple of days he has been mostly nonvocal confused and this appears to be outside of his baseline.    ED Course: Temperature 98.9, pulse 104, blood pressure 143/75, pulse ox 99% on room air, sodium 109, potassium 3.5, chloride 74, calcium 8.3, alk phos 172, albumin 2.7, total protein 6.9, total bilirubin  0.8, WBC 5.3, hemoglobin 9.8, hematocrit 28.5, platelet count 244.  Phenytoin level 12.0.  Urinalysis with large leukocytes and ketones, UDS positive for benzodiazepines, CT head without contrast no evidence of acute findings, chest x-ray with findings of atelectasis but no other acute infiltrates.  I discussed with our nephrologist Dr. Royce Macadamia and she is recommending starting hypertonic saline 30 cc/h for 4 hours with close sodium checks.  The patient is being admitted to the intensive care unit.  Review of Systems: Unable to obtain due to patient's confusion and altered mentation and encephalopathy   Past Medical History:  Diagnosis Date  . Alcohol abuse   . Alcohol abuse, continuous   . Arthritis   . 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 Diabetes mellitus without complication ([place])   . H/O: CVA (cerebrovascular accident)   . Hemiparesis affecting left side as late effect of cerebrovascular accident ([place])   . Hyperlipidemia   . Hypertension   . Syncope 07/02/2016  . Syncope and collapse 01/27/2020    Past Surgical History:  Procedure Laterality Date  . none       reports that he has been smoking cigarettes. He has been smoking about 0.50 packs per day. He has never used smokeless tobacco. He reports previous alcohol use. He reports that he does not use drugs.  No Known Allergies  Family History  Problem Relation Age of Onset  . Diabetes Mother   . Stroke Mother   . Heart disease Maternal Grandmother   . Heart disease Maternal Grandfather  Prior to Admission medications   Medication Sig Start Date End Date Taking? Authorizing Provider  amLODipine (NORVASC) 10 MG tablet Take 1 tablet (10 mg total) by mouth daily. 02/16/20   Carnella Guadalajara I, NP  aspirin EC 81 MG tablet Take 1 tablet (81 mg total) by mouth daily. 02/02/20   Carnella Guadalajara I, NP  atorvastatin (LIPITOR) 20 MG tablet Take 1 tablet (20 mg total) by mouth daily at 6 PM. 02/02/20   Carnella Guadalajara I, NP  blood glucose  meter kit and supplies KIT Dispense based on patient and insurance preference. Use up to four times daily as directed. (FOR ICD-9 250.00, 250.01). 01/28/20   Swayze, Ava, DO  carvedilol (COREG) 25 MG tablet Take 1 tablet (25 mg total) by mouth 2 (two) times daily with a meal. 02/02/20   Carnella Guadalajara I, NP  folic acid (FOLVITE) 1 MG tablet Take 1 tablet (1 mg total) by mouth daily. 02/02/20   Carnella Guadalajara I, NP  hydrALAZINE (APRESOLINE) 25 MG tablet Take 1 tablet (25 mg total) by mouth 2 (two) times daily. 03/12/20   Fritzi Mandes, MD  magnesium oxide (MAG-OX) 400 (241.3 Mg) MG tablet Take 1 tablet (400 mg total) by mouth 2 (two) times daily. 03/12/20   Fritzi Mandes, MD  metFORMIN (GLUCOPHAGE) 1000 MG tablet Take 1 tablet (1,000 mg total) by mouth daily with breakfast. 02/02/20 02/01/21  Carnella Guadalajara I, NP  Multiple Vitamin (MULTIVITAMIN WITH MINERALS) TABS tablet Take 1 tablet by mouth daily. 02/02/20   Carnella Guadalajara I, NP  phenytoin (DILANTIN) 100 MG ER capsule Take 300 mg at bedtime and 200 mg at daytime 03/12/20   Fritzi Mandes, MD  tamsulosin (FLOMAX) 0.4 MG CAPS capsule Take 1 capsule (0.4 mg total) by mouth daily after supper. 01/28/20   Swayze, Ava, DO  thiamine 100 MG tablet Take 1 tablet (100 mg total) by mouth daily. 02/02/20   Carnella Guadalajara I, NP    Physical Exam: Vitals:   03/30/20 1000 03/30/20 1030 03/30/20 1100 03/30/20 1130  BP: (!) 143/75 (!) 151/83 (!) 152/78 (!) 147/91  Pulse: (!) 104 (!) 105 (!) 104 (!) 108  Resp: 20 20 20  (!) 29  Temp:      TempSrc:      SpO2: 99% 99% 99% 100%  Weight:      Height:        Constitutional: Pt appears chronically ill, he is very confused, and nonvocal, he is somnolent but arousable.  Eyes: PERRL, lids and conjunctivae normal ENMT: Mucous membranes are pale and dry. Posterior pharynx clear of any exudate or lesions. Poor dentition.  Neck: normal, supple, no masses, no thyromegaly Respiratory: clear to auscultation bilaterally, no wheezing, no  crackles. Normal respiratory effort. No accessory muscle use.  Cardiovascular: tachycardic, no murmurs / rubs / gallops. 2++ pitting lower extremity edema. 2+ pedal pulses. No carotid bruits.  Abdomen: obese, no tenderness, no masses palpated. No hepatosplenomegaly. Bowel sounds positive.  Musculoskeletal: 2++ pitting edema BLEs, no clubbing / cyanosis. No joint deformity upper and lower extremities. Good ROM, no contractures. Normal muscle tone.  Skin: no rashes, lesions, ulcers. No induration Neurologic: CN 2-12 grossly intact. Sensation intact, DTR normal. Strength 5/5 in all 4.  Psychiatric: UTO.   Labs on Admission: I have personally reviewed following labs and imaging studies  CBC: Recent Labs  Lab 03/30/20 0949  WBC 5.3  HGB 9.8*  HCT 28.5*  MCV 81.0  PLT 338   Basic Metabolic Panel: Recent Labs  Lab 03/30/20 0949  NA 109*  K 3.5  CL 74*  CO2 22  GLUCOSE 90  BUN 5*  CREATININE 0.74  CALCIUM 8.3*   GFR: Estimated Creatinine Clearance: 131.1 mL/min (by C-G formula based on SCr of 0.74 mg/dL). Liver Function Tests: Recent Labs  Lab 03/30/20 0949  AST 17  ALT 13  ALKPHOS 172*  BILITOT 0.8  PROT 6.9  ALBUMIN 2.7*   No results for input(s): LIPASE, AMYLASE in the last 168 hours. No results for input(s): AMMONIA in the last 168 hours. Coagulation Profile: No results for input(s): INR, PROTIME in the last 168 hours. Cardiac Enzymes: No results for input(s): CKTOTAL, CKMB, CKMBINDEX, TROPONINI in the last 168 hours. BNP (last 3 results) No results for input(s): PROBNP in the last 8760 hours. HbA1C: No results for input(s): HGBA1C in the last 72 hours. CBG: Recent Labs  Lab 03/30/20 0934  GLUCAP 90   Lipid Profile: No results for input(s): CHOL, HDL, LDLCALC, TRIG, CHOLHDL, LDLDIRECT in the last 72 hours. Thyroid Function Tests: No results for input(s): TSH, T4TOTAL, FREET4, T3FREE, THYROIDAB in the last 72 hours. Anemia Panel: No results for input(s):  VITAMINB12, FOLATE, FERRITIN, TIBC, IRON, RETICCTPCT in the last 72 hours. Urine analysis:    Component Value Date/Time   COLORURINE YELLOW 03/30/2020 0949   APPEARANCEUR HAZY (A) 03/30/2020 0949   APPEARANCEUR Clear 05/02/2014 0134   LABSPEC 1.009 03/30/2020 0949   LABSPEC 1.004 05/02/2014 0134   PHURINE 5.0 03/30/2020 0949   GLUCOSEU NEGATIVE 03/30/2020 0949   GLUCOSEU Negative 05/02/2014 0134   HGBUR MODERATE (A) 03/30/2020 0949   BILIRUBINUR NEGATIVE 03/30/2020 0949   BILIRUBINUR Negative 05/02/2014 0134   KETONESUR 20 (A) 03/30/2020 0949   PROTEINUR NEGATIVE 03/30/2020 0949   NITRITE NEGATIVE 03/30/2020 0949   LEUKOCYTESUR LARGE (A) 03/30/2020 0949   LEUKOCYTESUR Negative 05/02/2014 0134    Radiological Exams on Admission: CT Head Wo Contrast  Result Date: 03/30/2020 CLINICAL DATA:  Encephalopathy. Additional provided: Elevated sodium levels, lethargic. EXAM: CT HEAD WITHOUT CONTRAST TECHNIQUE: Contiguous axial images were obtained from the base of the skull through the vertex without intravenous contrast. COMPARISON:  Head CT examination 03/03/2020 and earlier FINDINGS: Brain: The examination is mild to moderately motion degraded. Stable, mild generalized parenchymal atrophy. 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 Redemonstrated chronic infarct within the right external capsule. Unchanged moderate patchy hypodensity within the cerebral white matter which is nonspecific, but consistent with chronic small vessel ischemic disease. There is no acute intracranial hemorrhage. No demarcated cortical infarct is identified. No extra-axial fluid collection. No evidence of intracranial mass. No midline shift. Incidentally noted cavum septum pellucidum and cavum vergae. Vascular: No hyperdense vessel.  Atherosclerotic calcifications. Skull: Normal. Negative for fracture or focal lesion. Sinuses/Orbits: Visualized orbits show no acute finding. Mild  ethmoid sinus mucosal thickening. Moderate right maxillary sinus mucosal thickening. No significant mastoid effusion. IMPRESSION: Mild-to-moderate motion degradation. No evidence of acute intracranial abnormality. Stable generalized parenchymal atrophy and chronic small vessel ischemic disease. Paranasal sinus mucosal thickening as described, most notably right maxillary. Electronically Signed   By: Kellie Simmering DO   On: 03/30/2020 10:34   DG Chest Portable 1 View  Result Date: 03/30/2020 CLINICAL DATA:  Altered mental status. EXAM: PORTABLE CHEST 1 VIEW COMPARISON:  Prior chest radiograph 03/02/2020 FINDINGS: Mild cardiomegaly, unchanged. Minimal left basilar atelectasis. No appreciable airspace consolidation or frank pulmonary edema. No evidence of pleural effusion or pneumothorax. No acute bony abnormality  identified. IMPRESSION: Minimal left basilar atelectasis.  Lungs otherwise clear. Mild cardiomegaly, unchanged. Electronically Signed   By: Kellie Simmering DO   On: 03/30/2020 10:36    EKG: Independently reviewed. 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 Severe hyponatremia - Pt presents with acute on chronic hyponatremia, with a marked drop in sodium from recent testing.  Sodium was 130 about 2 weeks ago and now down to a critically low level of 109 and he is symptomatic.  I have spoken with Dr Royce Macadamia nephrologist and will start hypertonic saline 3% at 30 ml per hour x 4 hours with frequent sodium testing.  Nephrology will consult.  Pt is being admitted to ICU.   2. DNR -  present on admission.  3. Metabolic encephalopathy - neuro checks as ordered.  4. Volume overload - NPO for now.   5. Epilepsy - IV dilantin per pharmacy while NPO.  6. Type 2 Diabetes mellitus with vascular complications - SSI coverage and frequent CBG testing.  7. Alcohol abuse - He has been dry for the past several weeks while a resident at [place] and presents today with negative alcohol levels.  8. Sinus tachycardia - rebound tachycardia from beta blockers, IV lopressor ordered every 6 hours.  9. Essential hypertension - IV lopressor ordered, holding home oral meds for now.  10. UTI - IV ceftriaxone ordered and urine culture sent.   DVT prophylaxis: lovenox   Code Status: DNR   Family Communication:   Disposition Plan: [employer] called: nephrology   Admission status: INP    Critical Care Procedure Note Authorized and Performed by: Murvin Natal MD  Total Critical Care time:  70 mins Due to a high probability of clinically significant, life threatening deterioration, the patient required my highest level of preparedness to intervene emergently and I personally spent this critical care time directly and personally managing the patient.  This critical care time included obtaining a history; examining the patient, pulse oximetry; ordering and review of studies; arranging urgent treatment with development of a management plan; evaluation of patient's response of treatment; frequent reassessment; and discussions with other providers.  This critical care time was performed to assess and manage the high probability of imminent and life threatening deterioration that could result in multi-organ failure.  It was exclusive of separately billable procedures and treating other patients and teaching time.   Irwin Brakeman MD [facility] How to contact the [facility] Attending or Consulting provider [place] or covering provider during after hours [place], for this patient?  1. Check  the care team in [facility] and look for a) attending/consulting [facility] provider listed and b) the [facility] team listed 2. Log into [email or web address] and use [place]'s universal password to access. If you do not have the password, please contact the hospital operator. 3. Locate the [facility] provider you are looking for under [facility] and page to a number that you can be directly reached. 4. If you still have difficulty reaching the provider, please page the [facility] (Director on Call) for the Hospitalists  listed on amion for assistance.   If 7PM-7AM, please contact night-coverage [email or web address] Password [email or web address]  03/30/2020, 12:11 PM

## 2020-03-30 NOTE — Progress Notes (Signed)
CRITICAL VALUE ALERT  Critical Value: Na 111 Date & Time Notied:  03/30/20 1605  Provider Notified: Dr Laural Benes  Orders Received/Actions taken: advised Dr. Malen Gauze would be by to see pt. Only wanted 3% to infuse for 4 hours. MD will decided to continue or not when MD sees patient.

## 2020-03-30 NOTE — ED Triage Notes (Signed)
PT brought in by [facility] from [facility] today for AMS. Nursing staff at SNF reported to EMS pt is normally alert and oriented and last known that way last night. EMS reports pt had a low sodium level last week but pt refused IV fluids and was given sodium tablets. PT withdrawing from pain upon arrival and keeps his eyes closed when asked questions and answers them inappropriately.

## 2020-03-31 DIAGNOSIS — Z66 Do not resuscitate: Secondary | ICD-10-CM

## 2020-03-31 LAB — MAGNESIUM: Magnesium: 1 mg/dL — ABNORMAL LOW (ref 1.7–2.4)

## 2020-03-31 LAB — BASIC METABOLIC PANEL
Anion gap: 16 — ABNORMAL HIGH (ref 5–15)
Anion gap: 15 (ref 5–15)
Anion gap: 14 (ref 5–15)
Anion gap: 11 (ref 5–15)
Anion gap: 14 (ref 5–15)
BUN: 5 mg/dL — ABNORMAL LOW (ref 8–23)
BUN: 5 mg/dL — ABNORMAL LOW (ref 8–23)
BUN: 5 mg/dL — ABNORMAL LOW (ref 8–23)
BUN: 5 mg/dL — ABNORMAL LOW (ref 8–23)
BUN: 5 mg/dL — ABNORMAL LOW (ref 8–23)
CO2: 21 mmol/L — ABNORMAL LOW (ref 22–32)
CO2: 20 mmol/L — ABNORMAL LOW (ref 22–32)
CO2: 21 mmol/L — ABNORMAL LOW (ref 22–32)
CO2: 24 mmol/L (ref 22–32)
CO2: 24 mmol/L (ref 22–32)
Calcium: 8.2 mg/dL — ABNORMAL LOW (ref 8.9–10.3)
Calcium: 8.2 mg/dL — ABNORMAL LOW (ref 8.9–10.3)
Calcium: 8.1 mg/dL — ABNORMAL LOW (ref 8.9–10.3)
Calcium: 8.2 mg/dL — ABNORMAL LOW (ref 8.9–10.3)
Calcium: 8.5 mg/dL — ABNORMAL LOW (ref 8.9–10.3)
Chloride: 81 mmol/L — ABNORMAL LOW (ref 98–111)
Chloride: 77 mmol/L — ABNORMAL LOW (ref 98–111)
Chloride: 83 mmol/L — ABNORMAL LOW (ref 98–111)
Chloride: 85 mmol/L — ABNORMAL LOW (ref 98–111)
Chloride: 86 mmol/L — ABNORMAL LOW (ref 98–111)
Creatinine, Ser: 0.77 mg/dL (ref 0.61–1.24)
Creatinine, Ser: 0.71 mg/dL (ref 0.61–1.24)
Creatinine, Ser: 0.8 mg/dL (ref 0.61–1.24)
Creatinine, Ser: 0.78 mg/dL (ref 0.61–1.24)
Creatinine, Ser: 0.74 mg/dL (ref 0.61–1.24)
GFR calc Af Amer: >60 mL/min (ref >60)
GFR calc Af Amer: >60 mL/min (ref >60)
GFR calc Af Amer: >60 mL/min (ref >60)
GFR calc Af Amer: >60 mL/min (ref >60)
GFR calc Af Amer: >60 mL/min (ref >60)
GFR calc non Af Amer: >60 mL/min (ref >60)
GFR calc non Af Amer: >60 mL/min (ref >60)
GFR calc non Af Amer: >60 mL/min (ref >60)
GFR calc non Af Amer: >60 mL/min (ref >60)
GFR calc non Af Amer: >60 mL/min (ref >60)
Glucose, Bld: 74 mg/dL (ref 70–99)
Glucose, Bld: 64 mg/dL — ABNORMAL LOW (ref 70–99)
Glucose, Bld: 74 mg/dL (ref 70–99)
Glucose, Bld: 100 mg/dL — ABNORMAL HIGH (ref 70–99)
Glucose, Bld: 95 mg/dL (ref 70–99)
Potassium: 3.1 mmol/L — ABNORMAL LOW (ref 3.5–5.1)
Potassium: 3.7 mmol/L (ref 3.5–5.1)
Potassium: 3.4 mmol/L — ABNORMAL LOW (ref 3.5–5.1)
Potassium: 3.2 mmol/L — ABNORMAL LOW (ref 3.5–5.1)
Potassium: 3.5 mmol/L (ref 3.5–5.1)
Sodium: 118 mmol/L — CL (ref 135–145)
Sodium: 112 mmol/L — CL (ref 135–145)
Sodium: 118 mmol/L — CL (ref 135–145)
Sodium: 120 mmol/L — ABNORMAL LOW (ref 135–145)
Sodium: 124 mmol/L — ABNORMAL LOW (ref 135–145)

## 2020-03-31 LAB — GLUCOSE, CAPILLARY
Glucose-Capillary: 70 mg/dL (ref 70–99)
Glucose-Capillary: 71 mg/dL (ref 70–99)
Glucose-Capillary: 73 mg/dL (ref 70–99)
Glucose-Capillary: 94 mg/dL (ref 70–99)

## 2020-03-31 LAB — CBC WITH DIFFERENTIAL/PLATELET
Abs Immature Granulocytes: 0.04 10*3/uL (ref 0.00–0.07)
Basophils Absolute: 0 10*3/uL (ref 0.0–0.1)
Basophils Relative: 1 %
Eosinophils Absolute: 0.1 10*3/uL (ref 0.0–0.5)
Eosinophils Relative: 1 %
HCT: 26.8 % — ABNORMAL LOW (ref 39.0–52.0)
Hemoglobin: 8.9 g/dL — ABNORMAL LOW (ref 13.0–17.0)
Immature Granulocytes: 1 %
Lymphocytes Relative: 14 %
Lymphs Abs: 0.9 10*3/uL (ref 0.7–4.0)
MCH: 27.5 pg (ref 26.0–34.0)
MCHC: 33.2 g/dL (ref 30.0–36.0)
MCV: 82.7 fL (ref 80.0–100.0)
Monocytes Absolute: 0.8 10*3/uL (ref 0.1–1.0)
Monocytes Relative: 12 %
Neutro Abs: 5 10*3/uL (ref 1.7–7.7)
Neutrophils Relative %: 71 %
Platelets: 269 10*3/uL (ref 150–400)
RBC: 3.24 MIL/uL — ABNORMAL LOW (ref 4.22–5.81)
RDW: 13.5 % (ref 11.5–15.5)
WBC: 6.9 10*3/uL (ref 4.0–10.5)
nRBC: 0 % (ref 0.0–0.2)

## 2020-03-31 LAB — SODIUM
Sodium: 115 mmol/L — CL (ref 135–145)
Sodium: 118 mmol/L — CL (ref 135–145)
Sodium: 136 mmol/L (ref 135–145)
Sodium: 123 mmol/L — ABNORMAL LOW (ref 135–145)

## 2020-03-31 MED ORDER — POTASSIUM CHLORIDE 10 MEQ/100ML IV SOLN
10.0000 meq | INTRAVENOUS | Status: AC
  Administered 2020-03-31 (×4): 10 meq via INTRAVENOUS
  Filled 2020-03-31 (×4): qty 100

## 2020-03-31 MED ORDER — TOLVAPTAN 15 MG PO TABS
15.0000 mg | ORAL_TABLET | ORAL | Status: DC
  Filled 2020-03-31 (×2): qty 1

## 2020-03-31 MED ORDER — POTASSIUM CHLORIDE 10 MEQ/100ML IV SOLN
10.0000 meq | INTRAVENOUS | Status: AC
  Administered 2020-03-31 (×4): 10 meq via INTRAVENOUS
  Filled 2020-03-31 (×2): qty 100

## 2020-03-31 MED ORDER — SODIUM CHLORIDE 3 % IV SOLN
INTRAVENOUS | Status: DC
  Filled 2020-03-31 (×3): qty 500

## 2020-03-31 MED ORDER — MAGNESIUM OXIDE 400 (241.3 MG) MG PO TABS
800.0000 mg | ORAL_TABLET | Freq: Two times a day (BID) | ORAL | Status: DC
  Administered 2020-03-31 – 2020-04-15 (×30): 800 mg via ORAL
  Filled 2020-03-31 (×30): qty 2

## 2020-03-31 MED ORDER — MAGNESIUM SULFATE 4 GM/100ML IV SOLN
4.0000 g | Freq: Once | INTRAVENOUS | Status: AC
  Administered 2020-03-31: 4 g via INTRAVENOUS
  Filled 2020-03-31: qty 100

## 2020-03-31 MED ORDER — FUROSEMIDE 10 MG/ML IJ SOLN
40.0000 mg | Freq: Once | INTRAMUSCULAR | Status: AC
  Administered 2020-03-31: 40 mg via INTRAVENOUS
  Filled 2020-03-31: qty 4

## 2020-03-31 MED ORDER — METOPROLOL TARTRATE 5 MG/5ML IV SOLN
10.0000 mg | Freq: Four times a day (QID) | INTRAVENOUS | Status: DC
  Administered 2020-03-31 – 2020-04-01 (×4): 10 mg via INTRAVENOUS
  Filled 2020-03-31 (×4): qty 10

## 2020-03-31 NOTE — Progress Notes (Signed)
Patient ID: Brad Graham, male   DOB: 11/24/1960, 59 y.o.   MRN: [id number] [facility] Progress Note   Assessment/ Plan:   1. Hyponatremia: Hypervolemic based on exam.  This appears to be possibly acute on chronic based on the available data.  Overnight with hypertonic saline administration and corresponding stress/acceptable improvement of sodium to 118.  Hypertonic saline discontinued and today will give tolvaptan (unless the patient's mental status precludes oral intake of fluid, in which case, we will manage with furosemide intravenously).  He is hypervolemic on exam and has inappropriately elevated urine osmolality indicating inappropriate ADH state.  Will avoid concomitant administration of furosemide at this time to limit rapid rise of sodium levels.  Replace potassium and magnesium to help improve osmolality with the former. 2.  Hypokalemia: Suspect secondary to underlying nutritional deficiency with history of alcoholism.  Exacerbated by diuretic use, ongoing intravenous replacement. 3.  Hypomagnesemia: Secondary to poor oral intake possibly associated with underlying lifestyle.  Replace via intravenous and oral route. 4.  History of seizure disorder: Continue antiepileptic therapy with Dilantin and ongoing monitoring for seizures. 5.  Urinary tract infection: Suspected based on initial urinalysis, started on intravenous ceftriaxone by primary service, continue to await urine cultures.  Subjective:   Intermittently awake overnight, with possible sleep apnea noted by bedside nurse.  Birthday today.   Objective:   BP (!) 148/75   Pulse (!) 110   Temp 99.6 F (37.6 C) (Oral)   Resp (!) 24   Ht 6' (1.829 m)   Wt 111.1 kg   SpO2 98%   BMI 33.22 kg/m   Intake/Output Summary (Last 24 hours) at 03/31/2020 0830 Last data filed at 03/31/2020 0500 Gross per 24 hour  Intake 487.3 ml  Output 5050 ml  Net -4562.7 ml   Weight change:   Physical Exam: Gen: Appears somnolent  resting in bed, awakens and calling his name and reports that he is "feeling better". CVS: Pulse regular tachycardia, S1 and S2 normal Resp: Clear to auscultation anteriorly, no rales/rhonchi Abd: Soft, obese, nontender, bowel sounds normal Ext: 2+-3+ lower extremity edema  Imaging: CT Head Wo Contrast  Result Date: 03/30/2020 CLINICAL DATA:  Encephalopathy. Additional provided: Elevated sodium levels, lethargic. EXAM: CT HEAD WITHOUT CONTRAST TECHNIQUE: Contiguous axial images were obtained from the base of the skull through the vertex without intravenous contrast. COMPARISON:  Head CT examination 03/03/2020 and earlier FINDINGS: Brain: The examination is mild to moderately motion degraded. Stable, mild generalized parenchymal atrophy. 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 Redemonstrated chronic infarct within the right external capsule. Unchanged moderate patchy hypodensity within the cerebral white matter which is nonspecific, but consistent with chronic small vessel ischemic disease. There is no acute intracranial hemorrhage. No demarcated cortical infarct is identified. No extra-axial fluid collection. No evidence of intracranial mass. No midline shift. Incidentally noted cavum septum pellucidum and cavum vergae. Vascular: No hyperdense vessel.  Atherosclerotic calcifications. Skull: Normal. Negative for fracture or focal lesion. Sinuses/Orbits: Visualized orbits show no acute finding. Mild ethmoid sinus mucosal thickening. Moderate right maxillary sinus mucosal thickening. No significant mastoid effusion. IMPRESSION: Mild-to-moderate motion degradation. No evidence of acute intracranial abnormality. Stable generalized parenchymal atrophy and chronic small vessel ischemic disease. Paranasal sinus mucosal thickening as described, most notably right maxillary. Electronically Signed   By: Jackey Loge DO   On: 03/30/2020 10:34   DG Chest Portable 1  View  Result Date: 03/30/2020 CLINICAL DATA:  Altered mental  status. EXAM: PORTABLE CHEST 1 VIEW COMPARISON:  Prior chest radiograph 03/02/2020 FINDINGS: Mild cardiomegaly, unchanged. Minimal left basilar atelectasis. No appreciable airspace consolidation or frank pulmonary edema. No evidence of pleural effusion or pneumothorax. No acute bony abnormality identified. IMPRESSION: Minimal left basilar atelectasis.  Lungs otherwise clear. Mild cardiomegaly, unchanged. Electronically Signed   By: Jackey Loge DO   On: 03/30/2020 10:36    Labs: BMET Recent Labs  Lab 03/30/20 0165 03/30/20 0949 03/30/20 1346 03/30/20 1455 03/30/20 1831 03/30/20 2110 03/31/20 0011 03/31/20 0341 03/31/20 0641  NA 109*   < > 109* 111* 111* 110* 115* 118* 118*  K 3.5  --   --   --   --   --   --  3.1*  --   CL 74*  --   --   --   --   --   --  81*  --   CO2 22  --   --   --   --   --   --  21*  --   GLUCOSE 90  --   --   --   --   --   --  74  --   BUN 5*  --   --   --   --   --   --  5*  --   CREATININE 0.74  --   --   --   --   --   --  0.77  --   CALCIUM 8.3*  --   --   --   --   --   --  8.2*  --    < > = values in this interval not displayed.   CBC Recent Labs  Lab 03/30/20 0949 03/31/20 0341  WBC 5.3 6.9  NEUTROABS  --  5.0  HGB 9.8* 8.9*  HCT 28.5* 26.8*  MCV 81.0 82.7  PLT 244 269    Medications:    . Chlorhexidine Gluconate Cloth  6 each Topical Daily  . enoxaparin (LOVENOX) injection  55 mg Subcutaneous Q24H  . insulin aspart  0-9 Units Subcutaneous Q6H  . magnesium oxide  800 mg Oral BID  . metoprolol tartrate  10 mg Intravenous Q6H  . pantoprazole (PROTONIX) IV  40 mg Intravenous Q24H  . tolvaptan  15 mg Oral Q24H   Zetta Bills, MD 03/31/2020, 8:30 AM

## 2020-03-31 NOTE — TOC Initial Note (Signed)
Transition of Care [facility]) - Initial/Assessment Note    Patient Details  Name: Brad Graham MRN: [id number] Date of Birth: 02-Jan-1961  Transition of [facility]) CM/SW Contact:    Elliot Gault, LCSW Phone Number: 03/31/2020, 1:08 PM  Clinical Narrative:                  Pt admitted from [facility]. He has reportedly only been there for about two weeks. Pt transferred to [facility] after a long stay at [facility] Spoke with Melissa at [facility] today to update. Per Melissa, pt can return at dc.  TOC will follow.  Expected Discharge Plan: Long Term Nursing Home Barriers to Discharge: Continued Medical Work up   Patient Goals and CMS Choice        Expected Discharge Plan and Services Expected Discharge Plan: Long Term Nursing Home In-house Referral: Clinical Social Work   Post Acute Care Choice: Resumption of Svcs/PTA Provider Living arrangements for the past 2 months: Skilled Nursing Facility                                      Prior Living Arrangements/Services Living arrangements for the past 2 months: Skilled Nursing Facility Lives with:: Facility Resident   Do you feel safe going back to the place where you live?: Yes      Need for Family Participation in Patient Care: No (Comment) Care giver support system in place?: Yes (comment)   Criminal Activity/Legal Involvement Pertinent to Current Situation/Hospitalization: No - Comment as needed  Activities of Daily Living Home Assistive Devices/Equipment: Other (Comment) (unknown) ADL Screening (condition at time of admission) Patient's cognitive ability adequate to safely complete daily activities?: No Is the patient deaf or have difficulty hearing?: No Does the patient have difficulty seeing, even when wearing glasses/contacts?: No Does the patient have difficulty concentrating, remembering, or making decisions?: Yes (pt altered mental status) Patient able to express need for assistance with ADLs?: No  (pt altered mental status) Does the patient have difficulty dressing or bathing?: Yes (altered mental status) Independently performs ADLs?: No Communication: Needs assistance Is this a change from baseline?: Change from baseline, expected to last <3 days Dressing (OT): Needs assistance Is this a change from baseline?: Change from baseline, expected to last <3days Grooming: Needs assistance Is this a change from baseline?: Change from baseline, expected to last <3 days Feeding: Needs assistance Is this a change from baseline?: Change from baseline, expected to last <3 days Bathing: Needs assistance Is this a change from baseline?: Change from baseline, expected to last <3 days Toileting: Needs assistance Is this a change from baseline?: Change from baseline, expected to last <3 days In/Out Bed: Needs assistance Is this a change from baseline?: Change from baseline, expected to last <3 days Walks in Home: Needs assistance Is this a change from baseline?: Change from baseline, expected to last <3 days Does the patient have difficulty walking or climbing stairs?: Yes Weakness of Legs: Both Weakness of Arms/Hands: Both  Permission Sought/Granted                  Emotional Assessment       Orientation: : Oriented to Self, Oriented to Place Alcohol / Substance Use: Not Applicable Psych Involvement: No (comment)  Admission diagnosis:  Hyponatremia [E87.1] Encephalopathy [G93.40] Patient Active Problem List   Diagnosis Date Noted  . Hyponatremia 03/30/2020  . Volume overload  03/30/2020  . UTI (urinary tract infection) 03/30/2020  . DNR (do not resuscitate) 03/30/2020  . Alcohol abuse   . Centrencephalic epilepsy (HCC)   . Hemiparesis affecting left side as late effect of cerebrovascular accident   . Bilateral leg edema   . Sinus tachycardia   . Hyperkalemia 03/03/2020  . Metabolic acidosis 03/03/2020  . Acute encephalopathy 03/03/2020  . Cardiogenic shock (HCC)  03/03/2020  . Hyperglycemia 03/03/2020  . Acute renal failure (HCC)   . Goals of care, counseling/discussion   . Palliative care by specialist   . History of Cardiopulmonary arrest  03/02/2020  . Unresponsive episode 01/28/2020  . Seizure (HCC) 08/02/2016  . Essential hypertension 05/10/2015  . Diabetes mellitus without complication (HCC) 05/10/2015  . Hyperlipidemia 05/10/2015  . Proteinuria 05/10/2015   PCP:  [name], NP Pharmacy:   [facility] - [place], [place] - [address] [place] [address] Phone: [phone] Fax: [phone]     Social Determinants of Health (SDOH) Interventions    Readmission Risk Interventions Readmission Risk Prevention Plan 03/31/2020  Transportation Screening Complete  Medication Review (RN CM) Complete  Some recent data might be hidden

## 2020-03-31 NOTE — Progress Notes (Signed)
PROGRESS NOTE   Brad Graham  EUM:[id number] DOB: 06-23-1961 DOA: 03/30/2020 PCP: [name], NP   Chief Complaint  Patient presents with  . Altered Mental Status    Brief Admission History:  59 y.o. male with medical history significant for a recent hospitalization at [facility] hospital for cardiac arrest requiring intubation acute renal failure aspiration pneumonia who was treated in the ICU for several days and subsequently extubated and eventually discharged to [facility].  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  He has a history of chronic alcohol abuse and he went through alcohol withdrawal during last hospitalization.  He has a chronic seizure disorder and is maintained on Dilantin.  He has type 2 diabetes mellitus and hypertension.  These are treated with oral medications.  He was admitted with acute mental status changes and severe hyponatremia with sodium of 109.     Assessment & Plan:   Principal Problem:   Hyponatremia Active Problems:   Essential hypertension   Diabetes mellitus without complication (HCC)   Hyperlipidemia   Proteinuria   History of Cardiopulmonary arrest    Acute encephalopathy   Hyperglycemia   Volume overload   Alcohol abuse   Centrencephalic epilepsy (HCC)   Hemiparesis affecting left side as late effect of cerebrovascular accident   Bilateral leg edema   Sinus tachycardia   UTI (urinary tract infection)   DNR (do not resuscitate)  1. Severe hyponatremia - Pt presented with acute on chronic hyponatremia, with a marked drop in sodium from recent testing.  Sodium was 130 about 2 weeks ago and now down to a critically low level of 109 and he is symptomatic.  His sodium is slowly improving.  He has been started on hypertonic saline orderset bundle with frequent monitoring of sodium levels.   Nephrology is following and managing the hypertonic saline.  His mentation is slowly improving but not  back to reported baseline.  Pt is being followed in ICU.   2. DNR - present on admission.  3. Metabolic encephalopathy - slowly improving, continue to treat as above.   4. Volume overload -  He was given IV lasix and has diuresed well, he remains on hypertonic saline now.  Follow.    5. Epilepsy - IV dilantin per pharmacy until his oral intake improves.  6. Type 2 Diabetes mellitus with vascular complications - SSI coverage and frequent CBG testing.  He has had low blood sugars and not requiring insulin at this time.    7. Alcohol abuse - He has been dry for the past several weeks while a resident at [facility] and presented with negative alcohol levels.  8. Sinus tachycardia - rebound tachycardia from beta blockers, IV lopressor ordered every 6 hours increased to 10 mg with holding parameters.  9. Essential hypertension - IV lopressor ordered, holding home oral meds for now until his mentation improves.   10. UTI - IV ceftriaxone ordered and urine culture sent.   DVT prophylaxis: lovenox   Code Status: DNR   Family Communication:  sister [name] updated telephone Disposition Plan: [employer] called: nephrology   Admission status: INP   Status is: Inpatient  Remains inpatient appropriate because:Persistent severe electrolyte disturbances, IV treatments appropriate due to intensity of illness or inability to take PO and Inpatient level of care appropriate due to severity of illness   Dispo: The patient is from: SNF  Anticipated d/c is to: SNF              Anticipated d/c date is: > 3 days              Patient currently is not medically stable to d/c. Consultants:   Nephrology   Procedures:     Antimicrobials:  Ceftriaxone 7/7>>   Subjective: Pt a little more alert today but remains somnolent, nonverbal, drifts in and out of sleepiness.   Objective: Vitals:   03/31/20 0500 03/31/20 0600 03/31/20 0700 03/31/20 0724  BP: (!) 156/76 (!) 141/77 (!)  148/75   Pulse: (!) 110 (!) 109 (!) 111 (!) 110  Resp: (!) 23 (!) 25 20 (!) 24  Temp:    99.6 F (37.6 C)  TempSrc:    Oral  SpO2: 99% 98% 96% 98%  Weight: 111.1 kg     Height:        Intake/Output Summary (Last 24 hours) at 03/31/2020 0928 Last data filed at 03/31/2020 0500 Gross per 24 hour  Intake 487.3 ml  Output 5050 ml  Net -4562.7 ml   Filed Weights   03/30/20 0940 03/30/20 1413 03/31/20 0500  Weight: 119.5 kg 114.3 kg 111.1 kg   Examination:  General exam: somnolent, but arousable, Appears calm and comfortable  Respiratory system: Clear to auscultation. Respiratory effort normal. Cardiovascular system: tachycardic rate, S1 & S2 heard. No JVD, murmurs, rubs, gallops or clicks. 2+ pedal edema. Gastrointestinal system: Abdomen is nondistended, soft and nontender. No organomegaly or masses felt. Normal bowel sounds heard. Central nervous system: Alert and oriented. No focal neurological deficits. Extremities: Symmetric 5 x 5 power. Skin: 2+ edema BLEs, No rashes, lesions or ulcers Psychiatry: Judgement and insight appear normal. Mood & affect appropriate.   Data Reviewed: I have personally reviewed following labs and imaging studies  CBC: Recent Labs  Lab 03/30/20 0949 03/31/20 0341  WBC 5.3 6.9  NEUTROABS  --  5.0  HGB 9.8* 8.9*  HCT 28.5* 26.8*  MCV 81.0 82.7  PLT 244 269    Basic Metabolic Panel: Recent Labs  Lab 03/30/20 0949 03/30/20 1346 03/30/20 1831 03/30/20 2110 03/31/20 0011 03/31/20 0341 03/31/20 0641  NA 109*   < > 111* 110* 115* 118* 112*  118*  K 3.5  --   --   --   --  3.1* 3.7  CL 74*  --   --   --   --  81* 77*  CO2 22  --   --   --   --  21* 20*  GLUCOSE 90  --   --   --   --  74 64*  BUN 5*  --   --   --   --  5* 5*  CREATININE 0.74  --   --   --   --  0.77 0.71  CALCIUM 8.3*  --   --   --   --  8.2* 8.2*  MG  --   --   --   --   --  1.0*  --    < > = values in this interval not displayed.    GFR: Estimated Creatinine  Clearance: 124.8 mL/min (by C-G formula based on SCr of 0.71 mg/dL).  Liver Function Tests: Recent Labs  Lab 03/30/20 0949  AST 17  ALT 13  ALKPHOS 172*  BILITOT 0.8  PROT 6.9  ALBUMIN 2.7*    CBG: Recent Labs  Lab 03/30/20 0934 03/30/20 1623 03/30/20  2359  GLUCAP 90 81 71    Recent Results (from the past 240 hour(s))  SARS Coronavirus 2 by RT PCR (hospital order, performed in [facility] hospital lab) Nasopharyngeal Nasopharyngeal Swab     Status: None   Collection Time: 03/30/20 12:04 PM   Specimen: Nasopharyngeal Swab  Result Value Ref Range Status   SARS Coronavirus 2 NEGATIVE NEGATIVE Final    Comment: (NOTE) SARS-CoV-2 target nucleic acids are NOT DETECTED.  The SARS-CoV-2 RNA is generally detectable in upper and lower respiratory specimens during the acute phase of infection. The lowest concentration of SARS-CoV-2 viral copies this assay can detect is 250 copies / mL. A negative result does not preclude SARS-CoV-2 infection and should not be used as the sole basis for treatment or other patient management decisions.  A negative result may occur with improper specimen collection / handling, submission of specimen other than nasopharyngeal swab, presence of viral mutation(s) within the areas targeted by this assay, and inadequate number of viral copies (<250 copies / mL). A negative result must be combined with clinical observations, patient history, and epidemiological information.  Fact Sheet for Patients:   BoilerBrush.com.cyhttps://www.fda.gov/media/136312/download  Fact Sheet for Healthcare Providers: https://pope.com/https://www.fda.gov/media/136313/download  This test is not yet approved or  cleared by the [place] FDA and has been authorized for detection and/or diagnosis of SARS-CoV-2 by FDA under an Emergency Use Authorization (EUA).  This EUA will remain in effect (meaning this test can be used) for the duration of the COVID-19 declaration under Section 564(b)(1) of the Act,  21 U.S.C. section 360bbb-3(b)(1), unless the authorization is terminated or revoked sooner.  Performed at [facility], [address] [place], [place] [address]   MRSA PCR Screening     Status: None   Collection Time: 03/30/20 12:35 PM   Specimen: Nasal Mucosa; Nasopharyngeal  Result Value Ref Range Status   MRSA by PCR NEGATIVE NEGATIVE Final    Comment:        The GeneXpert MRSA Assay (FDA approved for NASAL specimens only), is one component of a comprehensive MRSA colonization surveillance program. It is not intended to diagnose MRSA infection nor to guide or monitor treatment for MRSA infections. Performed at [facility], [address] [place], [place] [address]      Radiology Studies: CT Head Wo Contrast  Result Date: 03/30/2020 CLINICAL DATA:  Encephalopathy. Additional provided: Elevated sodium levels, lethargic. EXAM: CT HEAD WITHOUT CONTRAST TECHNIQUE: Contiguous axial images were obtained from the base of the skull through the vertex without intravenous contrast. COMPARISON:  Head CT examination 03/03/2020 and earlier FINDINGS: Brain: The examination is mild to moderately motion degraded. Stable, mild generalized parenchymal atrophy. 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 Redemonstrated chronic infarct within the right external capsule. Unchanged moderate patchy hypodensity within the cerebral white matter which is nonspecific, but consistent with chronic small vessel ischemic disease. There is no acute intracranial hemorrhage. No demarcated cortical infarct is identified. No extra-axial fluid collection. No evidence of intracranial mass. No midline shift. Incidentally noted cavum septum pellucidum and cavum vergae. Vascular: No hyperdense vessel.  Atherosclerotic calcifications. Skull: Normal. Negative for fracture or focal lesion. Sinuses/Orbits: Visualized orbits show no acute finding. Mild ethmoid sinus mucosal thickening.  Moderate right maxillary sinus mucosal thickening. No significant mastoid effusion. IMPRESSION: Mild-to-moderate motion degradation. No evidence of acute intracranial abnormality. Stable generalized parenchymal atrophy and chronic small vessel ischemic disease. Paranasal sinus mucosal thickening as described, most notably right maxillary. Electronically  Signed   By: Jackey Loge DO   On: 03/30/2020 10:34   DG Chest Portable 1 View  Result Date: 03/30/2020 CLINICAL DATA:  Altered mental status. EXAM: PORTABLE CHEST 1 VIEW COMPARISON:  Prior chest radiograph 03/02/2020 FINDINGS: Mild cardiomegaly, unchanged. Minimal left basilar atelectasis. No appreciable airspace consolidation or frank pulmonary edema. No evidence of pleural effusion or pneumothorax. No acute bony abnormality identified. IMPRESSION: Minimal left basilar atelectasis.  Lungs otherwise clear. Mild cardiomegaly, unchanged. Electronically Signed   By: Jackey Loge DO   On: 03/30/2020 10:36     Scheduled Meds: . Chlorhexidine Gluconate Cloth  6 each Topical Daily  . enoxaparin (LOVENOX) injection  55 mg Subcutaneous Q24H  . furosemide  40 mg Intravenous Once  . insulin aspart  0-9 Units Subcutaneous Q6H  . magnesium oxide  800 mg Oral BID  . metoprolol tartrate  10 mg Intravenous Q6H  . pantoprazole (PROTONIX) IV  40 mg Intravenous Q24H   Continuous Infusions: . cefTRIAXone (ROCEPHIN)  IV Stopped (03/30/20 1901)  . magnesium sulfate bolus IVPB 4 g (03/31/20 0808)  . phenytoin (DILANTIN) IV Stopped (03/30/20 2219)  . potassium chloride 10 mEq (03/31/20 0912)  . sodium chloride (hypertonic)       LOS: 1 day   Critical Care Procedure Note Authorized and Performed by: Maryln Manuel MD  Total Critical Care time:  34 minutes  Due to a high probability of clinically significant, life threatening deterioration, the patient required my highest level of preparedness to intervene emergently and I personally spent this critical care time  directly and personally managing the patient.  This critical care time included obtaining a history; examining the patient, pulse oximetry; ordering and review of studies; arranging urgent treatment with development of a management plan; evaluation of patient's response of treatment; frequent reassessment; and discussions with other providers.  This critical care time was performed to assess and manage the high probability of imminent and life threatening deterioration that could result in multi-organ failure.  It was exclusive of separately billable procedures and treating other patients and teaching time.    Standley Dakins, MD How to contact the [facility] Attending or Consulting provider 7A - 7P or covering provider during after hours 7P -7A, for this patient?  1. Check the care team in [facility] and look for a) attending/consulting TRH provider listed and b) the [facility] team listed 2. Log into [email or web address] and use [place]'s universal password to access. If you do not have the password, please contact the hospital operator. 3. Locate the [facility] provider you are looking for under [facility] and page to a number that you can be directly reached. 4. If you still have difficulty reaching the provider, please page the [facility] (Director on Call) for the Hospitalists listed on amion for assistance.  03/31/2020, 9:28 AM

## 2020-03-31 NOTE — Progress Notes (Addendum)
Patient ID: Brad Graham, male   DOB: 12-17-1960, 59 y.o.   MRN: [id number] Update/addendum to patient seen earlier. Repeat labs show sodium level now back down to 112 after previously being 118.  Recheck labs and if sodium level still remains this low- Discontinue tolvaptan and restart 3% saline drip with every 3 hour sodium level.  Will give furosemide intravenously. Continue to monitor mental status/in ICU.  Zetta Bills MD [facility]. Office # [phone] Pager # [phone] 9:11 AM

## 2020-03-31 NOTE — [place] FL2 (Signed)
[employer] MEDICAID FL2 LEVEL OF CARE SCREENING TOOL     IDENTIFICATION  Patient Name: Brad Graham Birthdate: May 24, 1961 Sex: male Admission Date (Current Location): 03/30/2020  [facility] and [place] Number:  [employer] and Address:  [facility],  [address], [place] [address]      Provider Number: [id number]  Attending Physician Name and Address:  [name], MD  Relative Name and Phone Number:       Current Level of Care: Hospital Recommended Level of Care: Skilled Nursing Facility Prior Approval Number:    Date Approved/Denied:   PASRR Number: [id number]  Discharge Plan: SNF    Current Diagnoses: Patient Active Problem List   Diagnosis Date Noted  . Hyponatremia 03/30/2020  . Volume overload 03/30/2020  . UTI (urinary tract infection) 03/30/2020  . DNR (do not resuscitate) 03/30/2020  . Alcohol abuse   . Centrencephalic epilepsy (HCC)   . Hemiparesis affecting left side as late effect of cerebrovascular accident   . Bilateral leg edema   . Sinus tachycardia   . Hyperkalemia 03/03/2020  . Metabolic acidosis 03/03/2020  . Acute encephalopathy 03/03/2020  . Cardiogenic shock (HCC) 03/03/2020  . Hyperglycemia 03/03/2020  . Acute renal failure (HCC)   . Goals of care, counseling/discussion   . Palliative care by specialist   . History of Cardiopulmonary arrest  03/02/2020  . Unresponsive episode 01/28/2020  . Seizure (HCC) 08/02/2016  . Essential hypertension 05/10/2015  . Diabetes mellitus without complication (HCC) 05/10/2015  . Hyperlipidemia 05/10/2015  . Proteinuria 05/10/2015    Orientation RESPIRATION BLADDER Height & Weight     Self, Place  Normal Continent Weight: 244 lb 14.9 oz (111.1 kg) Height:  6' (182.9 cm)  BEHAVIORAL SYMPTOMS/MOOD NEUROLOGICAL BOWEL NUTRITION STATUS    Convulsions/Seizures Continent Diet (see dc summary)  AMBULATORY STATUS COMMUNICATION OF NEEDS Skin   Limited Assist Verbally  Normal                       Personal Care Assistance Level of Assistance    Bathing Assistance: Limited assistance   Dressing Assistance: Limited assistance     Functional Limitations Info  Speech, Sight, Hearing Sight Info: Adequate Hearing Info: Adequate Speech Info: Impaired    SPECIAL CARE FACTORS FREQUENCY  PT (By licensed PT), OT (By licensed OT), Speech therapy     PT Frequency: 5x week OT Frequency: 3x week     Speech Therapy Frequency: 2-3x week      Contractures Contractures Info: Not present    Additional Factors Info    Code Status Info: DNR Allergies Info: NKA           Current Medications (03/31/2020):  This is the current hospital active medication list Current Facility-Administered Medications  Medication Dose Route Frequency Provider Last Rate Last Admin  . acetaminophen (TYLENOL) tablet 650 mg  650 mg Oral Q6H PRN Johnson, Clanford L, MD       Or  . acetaminophen (TYLENOL) suppository 650 mg  650 mg Rectal Q6H PRN Johnson, Clanford L, MD      . cefTRIAXone (ROCEPHIN) 1 g in sodium chloride 0.9 % 100 mL IVPB  1 g Intravenous Q24H [name], MD   Stopped at 03/30/20 1901  . Chlorhexidine Gluconate Cloth 2 % PADS 6 each  6 each Topical Daily Johnson, Clanford L, MD      . enoxaparin (LOVENOX) injection 55 mg  55 mg Subcutaneous Q24H Johnson, Clanford  L, MD   55 mg at 03/30/20 1756  . furosemide (LASIX) injection 40 mg  40 mg Intravenous Once Zetta Bills, MD      . insulin aspart (novoLOG) injection 0-9 Units  0-9 Units Subcutaneous Q6H Johnson, Clanford L, MD      . magnesium oxide (MAG-OX) tablet 800 mg  800 mg Oral BID Zetta Bills, MD      . metoprolol tartrate (LOPRESSOR) injection 10 mg  10 mg Intravenous Q6H Johnson, Clanford L, MD   10 mg at 03/31/20 1131  . ondansetron (ZOFRAN) tablet 4 mg  4 mg Oral Q6H PRN Johnson, Clanford L, MD       Or  . ondansetron (ZOFRAN) injection 4 mg  4 mg Intravenous Q6H PRN Johnson, Clanford L, MD       . pantoprazole (PROTONIX) injection 40 mg  40 mg Intravenous Q24H Johnson, Clanford L, MD   40 mg at 03/30/20 1754  . phenytoin (DILANTIN) 250 mg in sodium chloride 0.9 % 100 mL IVPB  250 mg Intravenous BID [name], MD   Stopped at 03/30/20 2219  . sodium chloride (hypertonic) 3 % solution   Intravenous Continuous Zetta Bills, MD 30 mL/hr at 03/31/20 1200 Rate Verify at 03/31/20 1200     Discharge Medications: Please see discharge summary for a list of discharge medications.  Relevant Imaging Results:  Relevant Lab Results:   Additional Information SSN: [id number]  Elliot Gault, LCSW

## 2020-03-31 NOTE — Progress Notes (Signed)
0641 NA 118. Cmet was ordered after 8am and added on to lab draw. Na resulted as 112. Pt Na was redrawn and Na was 118. Dr. Allena Katz made aware. Orders received and will continue to monitor.

## 2020-03-31 NOTE — Progress Notes (Signed)
NA 136 Dr. Allena Katz updated. New orders received will continue to monitor.

## 2020-03-31 NOTE — Progress Notes (Signed)
CRITICAL VALUE ALERT  Critical Value:  Urine osmolality 301  Date & Time Notied:  03/31/2020 1621  Provider Notified: Dr Laural Benes  Orders Received/Actions taken: none at this time

## 2020-04-01 ENCOUNTER — Inpatient Hospital Stay (HOSPITAL_COMMUNITY): Payer: Medicaid Other

## 2020-04-01 DIAGNOSIS — E8770 Fluid overload, unspecified: Secondary | ICD-10-CM

## 2020-04-01 DIAGNOSIS — N390 Urinary tract infection, site not specified: Secondary | ICD-10-CM

## 2020-04-01 LAB — GLUCOSE, CAPILLARY
Glucose-Capillary: 169 mg/dL — ABNORMAL HIGH (ref 70–99)
Glucose-Capillary: 103 mg/dL — ABNORMAL HIGH (ref 70–99)
Glucose-Capillary: 111 mg/dL — ABNORMAL HIGH (ref 70–99)
Glucose-Capillary: 113 mg/dL — ABNORMAL HIGH (ref 70–99)

## 2020-04-01 LAB — CBC WITH DIFFERENTIAL/PLATELET
Abs Immature Granulocytes: 0.03 10*3/uL (ref 0.00–0.07)
Basophils Absolute: 0 10*3/uL (ref 0.0–0.1)
Basophils Relative: 1 %
Eosinophils Absolute: 0.1 10*3/uL (ref 0.0–0.5)
Eosinophils Relative: 1 %
HCT: 26.2 % — ABNORMAL LOW (ref 39.0–52.0)
Hemoglobin: 8.4 g/dL — ABNORMAL LOW (ref 13.0–17.0)
Immature Granulocytes: 1 %
Lymphocytes Relative: 18 %
Lymphs Abs: 0.8 10*3/uL (ref 0.7–4.0)
MCH: 26.8 pg (ref 26.0–34.0)
MCHC: 32.1 g/dL (ref 30.0–36.0)
MCV: 83.7 fL (ref 80.0–100.0)
Monocytes Absolute: 0.6 10*3/uL (ref 0.1–1.0)
Monocytes Relative: 13 %
Neutro Abs: 2.9 10*3/uL (ref 1.7–7.7)
Neutrophils Relative %: 66 %
Platelets: 246 10*3/uL (ref 150–400)
RBC: 3.13 MIL/uL — ABNORMAL LOW (ref 4.22–5.81)
RDW: 13.6 % (ref 11.5–15.5)
WBC: 4.4 10*3/uL (ref 4.0–10.5)
nRBC: 0 % (ref 0.0–0.2)

## 2020-04-01 LAB — BASIC METABOLIC PANEL
Anion gap: 10 (ref 5–15)
Anion gap: 10 (ref 5–15)
BUN: <5 mg/dL — ABNORMAL LOW (ref 8–23)
BUN: 5 mg/dL — ABNORMAL LOW (ref 8–23)
CO2: 26 mmol/L (ref 22–32)
CO2: 25 mmol/L (ref 22–32)
Calcium: 8.2 mg/dL — ABNORMAL LOW (ref 8.9–10.3)
Calcium: 8.1 mg/dL — ABNORMAL LOW (ref 8.9–10.3)
Chloride: 85 mmol/L — ABNORMAL LOW (ref 98–111)
Chloride: 87 mmol/L — ABNORMAL LOW (ref 98–111)
Creatinine, Ser: 0.7 mg/dL (ref 0.61–1.24)
Creatinine, Ser: 0.77 mg/dL (ref 0.61–1.24)
GFR calc Af Amer: >60 mL/min (ref >60)
GFR calc Af Amer: >60 mL/min (ref >60)
GFR calc non Af Amer: >60 mL/min (ref >60)
GFR calc non Af Amer: >60 mL/min (ref >60)
Glucose, Bld: 162 mg/dL — ABNORMAL HIGH (ref 70–99)
Glucose, Bld: 172 mg/dL — ABNORMAL HIGH (ref 70–99)
Potassium: 3.5 mmol/L (ref 3.5–5.1)
Potassium: 4.1 mmol/L (ref 3.5–5.1)
Sodium: 121 mmol/L — ABNORMAL LOW (ref 135–145)
Sodium: 122 mmol/L — ABNORMAL LOW (ref 135–145)

## 2020-04-01 LAB — MAGNESIUM: Magnesium: 1.4 mg/dL — ABNORMAL LOW (ref 1.7–2.4)

## 2020-04-01 LAB — URINE CULTURE: Culture: >=100000 — AB

## 2020-04-01 MED ORDER — FUROSEMIDE 10 MG/ML IJ SOLN
40.0000 mg | Freq: Two times a day (BID) | INTRAMUSCULAR | Status: AC
  Administered 2020-04-01 – 2020-04-02 (×3): 40 mg via INTRAVENOUS
  Filled 2020-04-01 (×3): qty 4

## 2020-04-01 MED ORDER — ASPIRIN EC 81 MG PO TBEC
81.0000 mg | DELAYED_RELEASE_TABLET | Freq: Every day | ORAL | Status: DC
  Administered 2020-04-01 – 2020-04-15 (×15): 81 mg via ORAL
  Filled 2020-04-01 (×15): qty 1

## 2020-04-01 MED ORDER — VITAMIN D 25 MCG (1000 UNIT) PO TABS
2000.0000 [IU] | ORAL_TABLET | Freq: Every morning | ORAL | Status: DC
  Administered 2020-04-01 – 2020-04-15 (×15): 2000 [IU] via ORAL
  Filled 2020-04-01 (×15): qty 2

## 2020-04-01 MED ORDER — CARVEDILOL 25 MG PO TABS
25.0000 mg | ORAL_TABLET | Freq: Two times a day (BID) | ORAL | Status: DC
  Administered 2020-04-01 – 2020-04-15 (×29): 25 mg via ORAL
  Filled 2020-04-01: qty 1
  Filled 2020-04-01: qty 2
  Filled 2020-04-01: qty 1
  Filled 2020-04-01 (×2): qty 2
  Filled 2020-04-01 (×5): qty 1
  Filled 2020-04-01 (×2): qty 2
  Filled 2020-04-01: qty 1
  Filled 2020-04-01: qty 2
  Filled 2020-04-01: qty 1
  Filled 2020-04-01 (×2): qty 2
  Filled 2020-04-01: qty 1
  Filled 2020-04-01: qty 2
  Filled 2020-04-01: qty 1
  Filled 2020-04-01 (×6): qty 2
  Filled 2020-04-01: qty 1
  Filled 2020-04-01: qty 2

## 2020-04-01 MED ORDER — THIAMINE HCL 100 MG PO TABS
100.0000 mg | ORAL_TABLET | Freq: Every day | ORAL | Status: DC
  Administered 2020-04-01 – 2020-04-15 (×15): 100 mg via ORAL
  Filled 2020-04-01 (×15): qty 1

## 2020-04-01 MED ORDER — PHENYTOIN 50 MG PO CHEW: 200.0000 mg | CHEWABLE_TABLET | Freq: Every day | ORAL | Status: DC

## 2020-04-01 MED ORDER — MAGNESIUM SULFATE 4 GM/100ML IV SOLN
4.0000 g | Freq: Once | INTRAVENOUS | Status: AC
  Administered 2020-04-01: 4 g via INTRAVENOUS
  Filled 2020-04-01: qty 100

## 2020-04-01 MED ORDER — CHLORPROMAZINE HCL 25 MG PO TABS
25.0000 mg | ORAL_TABLET | Freq: Three times a day (TID) | ORAL | Status: DC | PRN
  Administered 2020-04-01 – 2020-04-09 (×3): 25 mg via ORAL
  Filled 2020-04-01 (×4): qty 1

## 2020-04-01 MED ORDER — SODIUM CHLORIDE 0.9 % IV SOLN
250.0000 mg | Freq: Two times a day (BID) | INTRAVENOUS | Status: DC
  Administered 2020-04-01 – 2020-04-03 (×5): 250 mg via INTRAVENOUS
  Filled 2020-04-01 (×8): qty 5

## 2020-04-01 MED ORDER — LORAZEPAM 2 MG/ML IJ SOLN
1.0000 mg | Freq: Once | INTRAMUSCULAR | Status: AC
  Administered 2020-04-01: 1 mg via INTRAVENOUS
  Filled 2020-04-01: qty 1

## 2020-04-01 MED ORDER — TAMSULOSIN HCL 0.4 MG PO CAPS
0.4000 mg | ORAL_CAPSULE | Freq: Every day | ORAL | Status: DC
  Administered 2020-04-01 – 2020-04-14 (×14): 0.4 mg via ORAL
  Filled 2020-04-01 (×15): qty 1

## 2020-04-01 MED ORDER — PHENYTOIN 50 MG PO CHEW: 300.0000 mg | CHEWABLE_TABLET | Freq: Every day | ORAL | Status: DC

## 2020-04-01 MED ORDER — POTASSIUM CHLORIDE 10 MEQ/100ML IV SOLN
10.0000 meq | INTRAVENOUS | Status: AC
  Administered 2020-04-01 (×4): 10 meq via INTRAVENOUS
  Filled 2020-04-01 (×4): qty 100

## 2020-04-01 MED ORDER — ATORVASTATIN CALCIUM 20 MG PO TABS
20.0000 mg | ORAL_TABLET | Freq: Every day | ORAL | Status: DC
  Administered 2020-04-01 – 2020-04-07 (×7): 20 mg via ORAL
  Filled 2020-04-01 (×7): qty 1

## 2020-04-01 MED ORDER — FOLIC ACID 1 MG PO TABS
1.0000 mg | ORAL_TABLET | Freq: Every day | ORAL | Status: DC
  Administered 2020-04-01 – 2020-04-15 (×15): 1 mg via ORAL
  Filled 2020-04-01 (×15): qty 1

## 2020-04-01 MED ORDER — METOCLOPRAMIDE HCL 5 MG/ML IJ SOLN
10.0000 mg | Freq: Once | INTRAMUSCULAR | Status: AC
  Administered 2020-04-01: 10 mg via INTRAVENOUS
  Filled 2020-04-01: qty 2

## 2020-04-01 NOTE — Progress Notes (Signed)
Pharmacy Brief Medication Note: Dilantin route change  Discontinue Dilantin 200mg  po daily with breakfast and Dilantin 300mg  daily at bedtime  Re-Start:  Dilantin  250mg  IVPB q12h  Begin administration immediately after dilution; complete infusion within 1 to 4 hours of preparation   Labs:  Goal level (total): 10-39mcg/mL  Albumin: 2.7g/dL on  Phenytoin level (total): 12.41mcg/mL on 03/30/20  CrClest~ 125  mL/min    Monitor patient for:  acute toxicity (eg, confusion, delirium, psychosis, encephalopathy  Check phenytoin levels:  if symptoms of toxicity or breakthrough seizures occur     Thank you for allowing pharmacy to participate in this patient's care.  03/30/79, Pharm. D. Clinical Pharmacist 04/01/2020 1:53 PM

## 2020-04-01 NOTE — Progress Notes (Signed)
Brad Graham  BLT:[id number] DOB: [birth year] DOA: 03/30/2020 PCP: [name], NP   Chief Complaint  Patient presents with  . Altered Mental Status    Brief Admission History:  59 y.o. male with medical history significant for a recent hospitalization at [facility] hospital for cardiac arrest requiring intubation acute renal failure aspiration pneumonia who was treated in the ICU for several days and subsequently extubated and eventually discharged to [facility].  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  He has a history of chronic alcohol abuse and he went through alcohol withdrawal during last hospitalization.  He has a chronic seizure disorder and is maintained on Dilantin.  He has type 2 diabetes mellitus and hypertension.  These are treated with oral medications.  He was admitted with acute mental status changes and severe hyponatremia with sodium of 109.     Assessment & Plan:   Principal Problem:   Hyponatremia Active Problems:   Essential hypertension   Diabetes mellitus without complication (HCC)   Hyperlipidemia   Proteinuria   History of Cardiopulmonary arrest    Acute encephalopathy   Hyperglycemia   Volume overload   Alcohol abuse   Centrencephalic epilepsy (HCC)   Hemiparesis affecting left side as late effect of cerebrovascular accident   Bilateral leg edema   Sinus tachycardia   UTI (urinary tract infection)   DNR (do not resuscitate)  1. Severe hyponatremia - Pt presented with acute on chronic hyponatremia, with a marked drop in sodium from recent testing.  Sodium was 130 about 2 weeks ago and down to a critically low level of 109 and he is symptomatic.  His sodium is slowly improving now up to 120.  He was initially on hypertonic saline but now discontinued.    Nephrology is following and starting IV lasix for diuresis.  His mentation is slowly improving.  Pt remains ICU appropriate.  2. DNR  - present on admission.  3. Metabolic encephalopathy - slowly improving, continue to treat as above.   4. Volume overload - IV lasix per nephrology team.    5. Epilepsy - resume home oral dilantin.  6. Type 2 Diabetes mellitus with vascular complications - SSI coverage and frequent CBG testing.  He has had low blood sugars and not requiring insulin at this time.    7. Alcohol abuse - He has been dry for the past several weeks while a resident at [facility] and presented with negative alcohol levels.  Resume oral vitamin supplements.  8. Sinus tachycardia - rebound tachycardia from beta blockers.  His carvedilol has been restarted.   9. Essential hypertension - resuming home BP meds.    10. E coli UTI - IV ceftriaxone ordered and urine culture sent.   DVT prophylaxis: lovenox   Code Status: DNR   Family Communication:  sister [name] updated telephone Disposition Plan: [employer] called: nephrology   Admission status: INP   Status is: Inpatient  Remains inpatient appropriate because:Persistent severe electrolyte disturbances, IV treatments appropriate due to intensity of illness or inability to take PO and Inpatient level of care appropriate due to severity of illness.  Sodium level remains critically low and is slowly being corrected.  Pt remains volume overloaded and now requiring IV lasix for diuresis.   Dispo: The patient is from: SNF              Anticipated d/c is to: SNF  Anticipated d/c date is: 3 days              Patient currently is not medically stable to d/c. Consultants:   Nephrology   Procedures:   Antimicrobials:  Ceftriaxone 7/7>>   Subjective: Pt much less somnolent today and he is eating and drinking. He says he feels fine.   Objective: Vitals:   04/01/20 0500 04/01/20 0600 04/01/20 0700 04/01/20 0738  BP: 140/82 129/75 137/68   Pulse: 93 88 84   Resp: (!) 21 (!) 30 18   Temp:    98.3 F (36.8 C)  TempSrc:      SpO2: 98% 100%  92%   Weight: 111.6 kg     Height:        Intake/Output Summary (Last 24 hours) at 04/01/2020 1313 Last data filed at 04/01/2020 0900 Gross per 24 hour  Intake 501.35 ml  Output 5325 ml  Net -4823.65 ml   Filed Weights   03/30/20 1413 03/31/20 0500 04/01/20 0500  Weight: 114.3 kg 111.1 kg 111.6 kg   Examination:  General exam: awake, alert, Appears calm and comfortable  Respiratory system: Clear to auscultation. Respiratory effort normal. Cardiovascular system: tachycardic rate, S1 & S2 heard. No JVD, murmurs, rubs, gallops or clicks. 2+ pedal edema. Gastrointestinal system: Abdomen is nondistended, soft and nontender. No organomegaly or masses felt. Normal bowel sounds heard. Central nervous system: Alert and oriented. No focal neurological deficits. Extremities: Symmetric 5 x 5 power. Skin: 1+ edema BLEs, No rashes, lesions or ulcers Psychiatry: Judgement and insight appear normal. Mood & affect appropriate.   Data Reviewed: I have personally reviewed following labs and imaging studies  CBC: Recent Labs  Lab 03/30/20 0949 03/31/20 0341 04/01/20 0458  WBC 5.3 6.9 4.4  NEUTROABS  --  5.0 2.9  HGB 9.8* 8.9* 8.4*  HCT 28.5* 26.8* 26.2*  MCV 81.0 82.7 83.7  PLT 244 269 246    Basic Metabolic Panel: Recent Labs  Lab 03/31/20 0341 03/31/20 0341 03/31/20 0641 03/31/20 0641 03/31/20 0940 03/31/20 1429 03/31/20 1529 03/31/20 2215 04/01/20 0458  NA 118*   < > 112*  118*   < > 118* 136 120* 124*  123* 121*  K 3.1*   < > 3.7  --  3.4*  --  3.2* 3.5 3.5  CL 81*   < > 77*  --  83*  --  85* 86* 85*  CO2 21*   < > 20*  --  21*  --  GLUCOSE 74   < > 64*  --  74  --  100* 95 162*  BUN 5*   < > 5*  --  5*  --  5* 5* <5*  CREATININE 0.77   < > 0.71  --  0.80  --  0.78 0.74 0.70  CALCIUM 8.2*   < > 8.2*  --  8.1*  --  8.2* 8.5* 8.2*  MG 1.0*  --   --   --   --   --   --   --  1.4*   < > = values in this interval not displayed.    GFR: Estimated Creatinine  Clearance: 125.1 mL/min (by C-G formula based on SCr of 0.7 mg/dL).  Liver Function Tests: Recent Labs  Lab 03/30/20 0949  AST 17  ALT 13  ALKPHOS 172*  BILITOT 0.8  PROT 6.9  ALBUMIN 2.7*    CBG: Recent Labs  Lab 03/31/20 0939 03/31/20 1119 03/31/20  2231 04/01/20 0513 04/01/20 1116  GLUCAP 73 70 94 169* 103*    Recent Results (from the past 240 hour(s))  SARS Coronavirus 2 by RT PCR (hospital order, performed in [facility] hospital lab) Nasopharyngeal Nasopharyngeal Swab     Status: None   Collection Time: 03/30/20 12:04 PM   Specimen: Nasopharyngeal Swab  Result Value Ref Range Status   SARS Coronavirus 2 NEGATIVE NEGATIVE Final    Comment: (NOTE) SARS-CoV-2 target nucleic acids are NOT DETECTED.  The SARS-CoV-2 RNA is generally detectable in upper and lower respiratory specimens during the acute phase of infection. The lowest concentration of SARS-CoV-2 viral copies this assay can detect is 250 copies / mL. A negative result does not preclude SARS-CoV-2 infection and should not be used as the sole basis for treatment or other patient management decisions.  A negative result may occur with improper specimen collection / handling, submission of specimen other than nasopharyngeal swab, presence of viral mutation(s) within the areas targeted by this assay, and inadequate number of viral copies (<250 copies / mL). A negative result must be combined with clinical observations, patient history, and epidemiological information.  Fact Sheet for Patients:   BoilerBrush.com.cyhttps://www.fda.gov/media/136312/download  Fact Sheet for Healthcare Providers: https://pope.com/https://www.fda.gov/media/136313/download  This test is not yet approved or  cleared by the [place] FDA and has been authorized for detection and/or diagnosis of SARS-CoV-2 by FDA under an Emergency Use Authorization (EUA).  This EUA will remain in effect (meaning this test can be used) for the duration of the COVID-19  declaration under Section 564(b)(1) of the Act, 21 U.S.C. section 360bbb-3(b)(1), unless the authorization is terminated or revoked sooner.  Performed at [facility], [address] [place], [place] [address]   Culture, Urine     Status: Abnormal   Collection Time: 03/30/20 12:11 PM   Specimen: Urine, Random  Result Value Ref Range Status   Specimen Description   Final    URINE, RANDOM Performed at [facility], [address] [place], [place] [address]    Special Requests   Final    NONE Performed at [facility], [address] [place], [place] [address]    Culture >=100,000 COLONIES/mL ESCHERICHIA COLI (A)  Final   Report Status 04/01/2020 FINAL  Final   Organism ID, Bacteria ESCHERICHIA COLI (A)  Final      Susceptibility   Escherichia coli - MIC*    AMPICILLIN 4 SENSITIVE Sensitive     CEFAZOLIN <=4 SENSITIVE Sensitive     CEFTRIAXONE <=0.25 SENSITIVE Sensitive     CIPROFLOXACIN <=0.25 SENSITIVE Sensitive     GENTAMICIN <=1 SENSITIVE Sensitive     IMIPENEM <=0.25 SENSITIVE Sensitive     NITROFURANTOIN 32 SENSITIVE Sensitive     TRIMETH/SULFA <=20 SENSITIVE Sensitive     AMPICILLIN/SULBACTAM <=2 SENSITIVE Sensitive     PIP/TAZO <=4 SENSITIVE Sensitive     * >=100,000 COLONIES/mL ESCHERICHIA COLI  MRSA PCR Screening     Status: None   Collection Time: 03/30/20 12:35 PM   Specimen: Nasal Mucosa; Nasopharyngeal  Result Value Ref Range Status   MRSA by PCR NEGATIVE NEGATIVE Final    Comment:        The GeneXpert MRSA Assay (FDA approved for NASAL specimens only), is one component of a comprehensive MRSA colonization surveillance program. It is not intended to diagnose MRSA infection nor to guide or monitor treatment for MRSA infections. Performed at [facility], [address] [place], [place] [address]   Culture, blood (Routine  X 2) w Reflex to ID Panel     Status: None (Preliminary result)   Collection Time: 03/31/20  5:20 PM   Specimen: BLOOD  Result Value  Ref Range Status   Specimen Description BLOOD LEFT ANTECUBITAL  Final   Special Requests   Final    BOTTLES DRAWN AEROBIC AND ANAEROBIC Blood Culture adequate volume   Culture   Final    NO GROWTH < 24 HOURS Performed at [facility], [address], [place], [place] [address]    Report Status PENDING  Incomplete  Culture, blood (Routine X 2) w Reflex to ID Panel     Status: None (Preliminary result)   Collection Time: 03/31/20  5:28 PM   Specimen: BLOOD  Result Value Ref Range Status   Specimen Description BLOOD BLOOD RIGHT WRIST  Final   Special Requests   Final    AEROBIC BOTTLE ONLY Blood Culture results may not be optimal due to an inadequate volume of blood received in culture bottles   Culture   Final    NO GROWTH < 24 HOURS Performed at [facility], [address] [place], [place] [address]    Report Status PENDING  Incomplete     Radiology Studies: DG CHEST PORT 1 VIEW  Result Date: 04/01/2020 CLINICAL DATA:  Fever. EXAM: PORTABLE CHEST 1 VIEW COMPARISON:  03/30/2020. FINDINGS: Mediastinum and hilar structures normal. Heart size stable. Low lung volumes. Mild left base atelectasis/infiltrate. No pleural effusion or pneumothorax. No acute bony abnormality. IMPRESSION: Low lung volumes with mild left base atelectasis/infiltrate. Similar findings noted on prior exam. Electronically Signed   By: Maisie Fus  Register   On: 04/01/2020 05:27   Scheduled Meds: . aspirin EC  81 mg Oral Daily  . atorvastatin  20 mg Oral q1800  . carvedilol  25 mg Oral BID WC  . Chlorhexidine Gluconate Cloth  6 each Topical Daily  . cholecalciferol  2,000 Units Oral q morning - 10a  . enoxaparin (LOVENOX) injection  55 mg Subcutaneous Q24H  . folic acid  1 mg Oral Daily  . furosemide  40 mg Intravenous Q12H  . insulin aspart  0-9 Units Subcutaneous Q6H  . magnesium oxide  800 mg Oral BID  . pantoprazole (PROTONIX) IV  40 mg Intravenous Q24H  . [START ON 04/02/2020] phenytoin  200 mg Oral Q breakfast    . tamsulosin  0.4 mg Oral QPC supper  . thiamine  100 mg Oral Daily   Continuous Infusions: . cefTRIAXone (ROCEPHIN)  IV 1 g (03/31/20 1630)     LOS: 2 days   Critical Care Procedure Note Authorized and Performed by: Maryln Manuel MD  Total Critical Care time:  31 minutes  Due to a high probability of clinically significant, life threatening deterioration, the patient required my highest level of preparedness to intervene emergently and I personally spent this critical care time directly and personally managing the patient.  This critical care time included obtaining a history; examining the patient, pulse oximetry; ordering and review of studies; arranging urgent treatment with development of a management plan; evaluation of patient's response of treatment; frequent reassessment; and discussions with other providers.  This critical care time was performed to assess and manage the high probability of imminent and life threatening deterioration that could result in multi-organ failure.  It was exclusive of separately billable procedures and treating other patients and teaching time.    Standley Dakins, MD How to contact the [facility] Attending or Consulting provider 7A - 7P  or covering provider during after hours 7P -7A, for this patient?  1. Check the care team in [facility] and look for a) attending/consulting TR[facility] provider listed and b) the [facility] team listed 2. Log into [email or web address] and use [place]'s universal password to access. If you do not have the password, please contact the hospital operator. 3. Locate the [facility] provider you are looking for under [facility] and page to a number that you can be directly reached. 4. If you still have difficulty reaching the provider, please page the [name] (Director on Call) for the Hospitalists listed on amion for assistance.  04/01/2020, 1:13 PM

## 2020-04-01 NOTE — Progress Notes (Signed)
Patient ID: Brad Graham, male   DOB: October 24, 1960, 59 y.o.   MRN: [id number] [facility] Progress Note   Assessment/ Plan:   1. Hyponatremia: Suspect acute on chronic hypervolemic hyponatremia.  Earlier on 3% saline for mental status changes suspected to be from hyponatremia-this was discontinued overnight.  He is more lucid this morning and the plan is to continue with furosemide for diuresis.  Will order for fluid restriction of 1.2 L/day.  Continue to follow sodium levels. 2.  Hypokalemia: Suspect secondary to underlying nutritional deficiency with history of alcoholism and recent diuresis.  Continue intravenous replacement as ordered anticipating process with diuresis. 3.  Hypomagnesemia: Secondary to poor oral intake possibly associated with underlying lifestyle.  Ongoing replacement.  Intravenous and oral labs. 4.  History of seizure disorder: Continue antiepileptic therapy with Dilantin and ongoing monitoring for seizures. 5.  Urinary tract infection: Suspected based on initial urinalysis, started on intravenous ceftriaxone by primary service, continue to await urine cultures.  Subjective:   Reports to be feeling better today, denies any chest pain or shortness of breath.   Objective:   BP 137/68    Pulse 84    Temp 98.3 F (36.8 C)    Resp 18    Ht 6' (1.829 m)    Wt 111.6 kg    SpO2 92%    BMI 33.37 kg/m   Intake/Output Summary (Last 24 hours) at 04/01/2020 1859 Last data filed at 04/01/2020 0524 Gross per 24 hour  Intake 947.49 ml  Output 5325 ml  Net -4377.51 ml   Weight change: -7.9 kg  Physical Exam: Gen: Comfortably sitting up in bed eating breakfast.  Oxygen via nasal cannula CVS: Pulse regular, normal rate, S1 and S2 normal Resp: Breath sounds bilaterally, no distinct rales/rhonchi Abd: Soft, obese, nontender, bowel sounds normal.  Dependent/pannus edema Ext: 2+ lower extremity edema localized to ankles/feet.  Imaging: CT Head Wo Contrast  Result Date:  03/30/2020 CLINICAL DATA:  Encephalopathy. Additional provided: Elevated sodium levels, lethargic. EXAM: CT HEAD WITHOUT CONTRAST TECHNIQUE: Contiguous axial images were obtained from the base of the skull through the vertex without intravenous contrast. COMPARISON:  Head CT examination 03/03/2020 and earlier FINDINGS: Brain: The examination is mild to moderately motion degraded. Stable, mild generalized parenchymal atrophy. 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 Redemonstrated chronic infarct within the right external capsule. Unchanged moderate patchy hypodensity within the cerebral white matter which is nonspecific, but consistent with chronic small vessel ischemic disease. There is no acute intracranial hemorrhage. No demarcated cortical infarct is identified. No extra-axial fluid collection. No evidence of intracranial mass. No midline shift. Incidentally noted cavum septum pellucidum and cavum vergae. Vascular: No hyperdense vessel.  Atherosclerotic calcifications. Skull: Normal. Negative for fracture or focal lesion. Sinuses/Orbits: Visualized orbits show no acute finding. Mild ethmoid sinus mucosal thickening. Moderate right maxillary sinus mucosal thickening. No significant mastoid effusion. IMPRESSION: Mild-to-moderate motion degradation. No evidence of acute intracranial abnormality. Stable generalized parenchymal atrophy and chronic small vessel ischemic disease. Paranasal sinus mucosal thickening as described, most notably right maxillary. Electronically Signed   By: Jackey Loge DO   On: 03/30/2020 10:34   DG CHEST PORT 1 VIEW  Result Date: 04/01/2020 CLINICAL DATA:  Fever. EXAM: PORTABLE CHEST 1 VIEW COMPARISON:  03/30/2020. FINDINGS: Mediastinum and hilar structures normal. Heart size stable. Low lung volumes. Mild left base atelectasis/infiltrate. No pleural effusion or pneumothorax. No acute bony abnormality. IMPRESSION: Low lung volumes with  mild  left base atelectasis/infiltrate. Similar findings noted on prior exam. Electronically Signed   By: Maisie Fus  Register   On: 04/01/2020 05:27   DG Chest Portable 1 View  Result Date: 03/30/2020 CLINICAL DATA:  Altered mental status. EXAM: PORTABLE CHEST 1 VIEW COMPARISON:  Prior chest radiograph 03/02/2020 FINDINGS: Mild cardiomegaly, unchanged. Minimal left basilar atelectasis. No appreciable airspace consolidation or frank pulmonary edema. No evidence of pleural effusion or pneumothorax. No acute bony abnormality identified. IMPRESSION: Minimal left basilar atelectasis.  Lungs otherwise clear. Mild cardiomegaly, unchanged. Electronically Signed   By: Jackey Loge DO   On: 03/30/2020 10:36    Labs: BMET Recent Labs  Lab 03/30/20 0092 03/30/20 1346 03/31/20 0341 03/31/20 3300 03/31/20 0940 03/31/20 1429 03/31/20 1529 03/31/20 2215 04/01/20 0458  NA 109*   < > 118* 112*   118* 118* 136 120* 124*   123* 121*  K 3.5  --  3.1* 3.7 3.4*  --  3.2* 3.5 3.5  CL 74*  --  81* 77* 83*  --  85* 86* 85*  CO2 22  --  21* 20* 21*  --  24 24 26   GLUCOSE 90  --  74 64* 74  --  100* 95 162*  BUN 5*  --  5* 5* 5*  --  5* 5* <5*  CREATININE 0.74  --  0.77 0.71 0.80  --  0.78 0.74 0.70  CALCIUM 8.3*  --  8.2* 8.2* 8.1*  --  8.2* 8.5* 8.2*   < > = values in this interval not displayed.   CBC Recent Labs  Lab 03/30/20 0949 03/31/20 0341 04/01/20 0458  WBC 5.3 6.9 4.4  NEUTROABS  --  5.0 2.9  HGB 9.8* 8.9* 8.4*  HCT 28.5* 26.8* 26.2*  MCV 81.0 82.7 83.7  PLT 244 269 246    Medications:     Chlorhexidine Gluconate Cloth  6 each Topical Daily   enoxaparin (LOVENOX) injection  55 mg Subcutaneous Q24H   furosemide  40 mg Intravenous Q12H   insulin aspart  0-9 Units Subcutaneous Q6H   magnesium oxide  800 mg Oral BID   metoprolol tartrate  10 mg Intravenous Q6H   pantoprazole (PROTONIX) IV  40 mg Intravenous Q24H   06/02/20, MD 04/01/2020, 8:32 AM

## 2020-04-01 NOTE — Progress Notes (Signed)
TRH night shift.  The patient has been very restless due to persistent hiccups.  He has not slept through the night. He has a history of central cephalic epilepsy, was admitted for hyponatremia and acute encephalopathy among others.  The patient is already on pantoprazole 40 mg IVP every 24 hours.  I will try metoclopramide 10 mg IVP and lorazepam 1 mg IVP ordered.  Consider adding baclofen or gabapentin if persistent.    Sanda Klein, MD

## 2020-04-02 DIAGNOSIS — R000 Tachycardia, unspecified: Secondary | ICD-10-CM

## 2020-04-02 LAB — CBC WITH DIFFERENTIAL/PLATELET
Abs Immature Granulocytes: 0.02 10*3/uL (ref 0.00–0.07)
Basophils Absolute: 0 10*3/uL (ref 0.0–0.1)
Basophils Relative: 1 %
Eosinophils Absolute: 0.1 10*3/uL (ref 0.0–0.5)
Eosinophils Relative: 4 %
HCT: 26.9 % — ABNORMAL LOW (ref 39.0–52.0)
Hemoglobin: 8.6 g/dL — ABNORMAL LOW (ref 13.0–17.0)
Immature Granulocytes: 1 %
Lymphocytes Relative: 30 %
Lymphs Abs: 1 10*3/uL (ref 0.7–4.0)
MCH: 27.3 pg (ref 26.0–34.0)
MCHC: 32 g/dL (ref 30.0–36.0)
MCV: 85.4 fL (ref 80.0–100.0)
Monocytes Absolute: 0.6 10*3/uL (ref 0.1–1.0)
Monocytes Relative: 18 %
Neutro Abs: 1.5 10*3/uL — ABNORMAL LOW (ref 1.7–7.7)
Neutrophils Relative %: 46 %
Platelets: 286 10*3/uL (ref 150–400)
RBC: 3.15 MIL/uL — ABNORMAL LOW (ref 4.22–5.81)
RDW: 14 % (ref 11.5–15.5)
WBC: 3.1 10*3/uL — ABNORMAL LOW (ref 4.0–10.5)
nRBC: 0 % (ref 0.0–0.2)

## 2020-04-02 LAB — BASIC METABOLIC PANEL
Anion gap: 11 (ref 5–15)
BUN: <5 mg/dL — ABNORMAL LOW (ref 8–23)
CO2: 28 mmol/L (ref 22–32)
Calcium: 8.6 mg/dL — ABNORMAL LOW (ref 8.9–10.3)
Chloride: 87 mmol/L — ABNORMAL LOW (ref 98–111)
Creatinine, Ser: 0.74 mg/dL (ref 0.61–1.24)
GFR calc Af Amer: >60 mL/min (ref >60)
GFR calc non Af Amer: >60 mL/min (ref >60)
Glucose, Bld: 101 mg/dL — ABNORMAL HIGH (ref 70–99)
Potassium: 3.6 mmol/L (ref 3.5–5.1)
Sodium: 126 mmol/L — ABNORMAL LOW (ref 135–145)

## 2020-04-02 LAB — GLUCOSE, CAPILLARY
Glucose-Capillary: 106 mg/dL — ABNORMAL HIGH (ref 70–99)
Glucose-Capillary: 109 mg/dL — ABNORMAL HIGH (ref 70–99)
Glucose-Capillary: 111 mg/dL — ABNORMAL HIGH (ref 70–99)
Glucose-Capillary: 124 mg/dL — ABNORMAL HIGH (ref 70–99)
Glucose-Capillary: 138 mg/dL — ABNORMAL HIGH (ref 70–99)
Glucose-Capillary: 141 mg/dL — ABNORMAL HIGH (ref 70–99)

## 2020-04-02 LAB — MAGNESIUM: Magnesium: 1.6 mg/dL — ABNORMAL LOW (ref 1.7–2.4)

## 2020-04-02 MED ORDER — SODIUM CHLORIDE 0.9% FLUSH
3.0000 mL | Freq: Two times a day (BID) | INTRAVENOUS | Status: DC
  Administered 2020-04-02 – 2020-04-15 (×26): 3 mL via INTRAVENOUS

## 2020-04-02 MED ORDER — FUROSEMIDE 10 MG/ML IJ SOLN
40.0000 mg | Freq: Two times a day (BID) | INTRAMUSCULAR | Status: AC
  Administered 2020-04-02 – 2020-04-03 (×3): 40 mg via INTRAVENOUS
  Filled 2020-04-02 (×3): qty 4

## 2020-04-02 MED ORDER — POTASSIUM CHLORIDE CRYS ER 20 MEQ PO TBCR
40.0000 meq | EXTENDED_RELEASE_TABLET | Freq: Once | ORAL | Status: AC
  Administered 2020-04-02: 40 meq via ORAL
  Filled 2020-04-02: qty 2

## 2020-04-02 MED ORDER — MAGNESIUM SULFATE 4 GM/100ML IV SOLN
4.0000 g | Freq: Once | INTRAVENOUS | Status: AC
  Administered 2020-04-02: 4 g via INTRAVENOUS
  Filled 2020-04-02: qty 100

## 2020-04-02 NOTE — Progress Notes (Addendum)
PROGRESS NOTE   Brad Graham  IFO:[id number] DOB: 1960-12-07 DOA: 03/30/2020 PCP: [name], NP   Chief Complaint  Patient presents with  . Altered Mental Status    Brief Admission History:  59 y.o. male with medical history significant for a recent hospitalization at [facility] hospital for cardiac arrest requiring intubation acute renal failure aspiration pneumonia who was treated in the ICU for several days and subsequently extubated and eventually discharged to [facility].  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  He has a history of chronic alcohol abuse and he went through alcohol withdrawal during last hospitalization.  He has a chronic seizure disorder and is maintained on Dilantin.  He has type 2 diabetes mellitus and hypertension.  These are treated with oral medications.  He was admitted with acute mental status changes and severe hyponatremia with sodium of 109.     Assessment & Plan:   Principal Problem:   Hyponatremia Active Problems:   Essential hypertension   Diabetes mellitus without complication (HCC)   Hyperlipidemia   Proteinuria   History of Cardiopulmonary arrest    Acute encephalopathy   Hyperglycemia   Volume overload   Alcohol abuse   Centrencephalic epilepsy (HCC)   Hemiparesis affecting left side as late effect of cerebrovascular accident   Bilateral leg edema   Sinus tachycardia   UTI (urinary tract infection)   DNR (do not resuscitate)  1. Severe hyponatremia - Slowly Improving with treatments.  Pt presented with acute on chronic hyponatremia, with a marked drop in sodium from recent testing.  Sodium was 130 about 2 weeks ago and was down to a critically low level of 109 and he is symptomatic.  His sodium is slowly improving now up to 120.  He was initially on hypertonic saline but now discontinued.    Nephrology is following and starting IV lasix for diuresis.  His mentation is slowly improving.   Pt remains ICU appropriate.  2. DNR - present on admission.  3. Metabolic encephalopathy - slowly improving, continue to treat as above.   4. Volume overload - IV lasix per nephrology team.   He has diuresed over 13L since admission.  5. Epilepsy - resumed dilantin. No seizure activity seen.  6. Type 2 Diabetes mellitus with vascular complications - SSI coverage and frequent CBG testing.    7. Alcohol abuse - He has been dry for the past several weeks while a resident at [facility] and presented with negative alcohol levels.  Resume oral vitamin supplements.  8. Hypomagnesemia - severe - continue IV replacement and follow.  9. Sinus tachycardia - rebound tachycardia from beta blockers.  His carvedilol has been restarted.   10. Essential hypertension - resuming home BP meds.    11. E coli UTI - IV ceftriaxone ordered and urine culture sent.   DVT prophylaxis: lovenox   Code Status: DNR   Family Communication:  sister [name] updated telephon 7/8, I attempted to call all contacts listed 7/10 no answer Disposition Plan: [employer] called: nephrology   Admission status: INP   Status is: Inpatient  Remains inpatient appropriate because:Persistent severe electrolyte disturbances, IV treatments appropriate due to intensity of illness or inability to take PO and Inpatient level of care appropriate due to severity of illness.  Sodium level remains critically low and is slowly being corrected.  Pt remains volume overloaded and now requiring IV lasix for diuresis.   Dispo: The patient  is from: SNF              Anticipated d/c is to: SNF              Anticipated d/c date is: 2-3 days              Patient currently is not medically stable to d/c. Consultants:   Nephrology   Procedures:   Antimicrobials:  Ceftriaxone 7/7>>   Subjective: Mentation is much improved, no complaints.  He says family can visit.    Objective: Vitals:   04/02/20 0800 04/02/20 0900 04/02/20 1000  04/02/20 1100  BP: (!) 143/90 127/86 127/70 137/82  Pulse: 86 85 81 79  Resp: 20 20 20 17   Temp: 98.3 F (36.8 C)     TempSrc: Oral     SpO2: 100% 100% 100% 100%  Weight:      Height:        Intake/Output Summary (Last 24 hours) at 04/02/2020 1120 Last data filed at 04/02/2020 0900 Gross per 24 hour  Intake 726.59 ml  Output 4201 ml  Net -3474.41 ml   Filed Weights   03/31/20 0500 04/01/20 0500 04/02/20 0510  Weight: 111.1 kg 111.6 kg 110.6 kg   Examination:  General exam: awake, alert, Appears calm and comfortable  Respiratory system: Clear to auscultation. Respiratory effort normal. Cardiovascular system: tachycardic rate, S1 & S2 heard. No JVD, murmurs, rubs, gallops or clicks. 2+ pedal edema. Gastrointestinal system: Abdomen is nondistended, soft and nontender. No organomegaly or masses felt. Normal bowel sounds heard. Central nervous system: Alert and oriented. No focal neurological deficits. Extremities: Symmetric 5 x 5 power. Skin: 1+ edema BLEs, No rashes, lesions or ulcers Psychiatry: Judgement and insight appear normal. Mood & affect appropriate.   Data Reviewed: I have personally reviewed following labs and imaging studies  CBC: Recent Labs  Lab 03/30/20 0949 03/31/20 0341 04/01/20 0458 04/02/20 0436  WBC 5.3 6.9 4.4 3.1*  NEUTROABS  --  5.0 2.9 1.5*  HGB 9.8* 8.9* 8.4* 8.6*  HCT 28.5* 26.8* 26.2* 26.9*  MCV 81.0 82.7 83.7 85.4  PLT 244 269 246 286    Basic Metabolic Panel: Recent Labs  Lab 03/31/20 0341 03/31/20 0641 03/31/20 1529 03/31/20 2215 04/01/20 0458 04/01/20 1357 04/02/20 0436  NA 118*   < > 120* 124*  123* 121* 122* 126*  K 3.1*   < > 3.2* 3.5 3.5 4.1 3.6  CL 81*   < > 85* 86* 85* 87* 87*  CO2 21*   < > 24 24 26 25 28   GLUCOSE 74   < > 100* 95 162* 172* 101*  BUN 5*   < > 5* 5* <5* 5* <5*  CREATININE 0.77   < > 0.78 0.74 0.70 0.77 0.74  CALCIUM 8.2*   < > 8.2* 8.5* 8.2* 8.1* 8.6*  MG 1.0*  --   --   --  1.4*  --  1.6*   < > =  values in this interval not displayed.    GFR: Estimated Creatinine Clearance: 124.5 mL/min (by C-G formula based on SCr of 0.74 mg/dL).  Liver Function Tests: Recent Labs  Lab 03/30/20 0949  AST 17  ALT 13  ALKPHOS 172*  BILITOT 0.8  PROT 6.9  ALBUMIN 2.7*    CBG: Recent Labs  Lab 04/01/20 1707 04/01/20 2001 04/02/20 0040 04/02/20 0632 04/02/20 0804  GLUCAP 111* 113* 111* 141* 106*    Recent Results (from the past 240 hour(s))  SARS  Coronavirus 2 by RT PCR (hospital order, performed in [facility] hospital lab) Nasopharyngeal Nasopharyngeal Swab     Status: None   Collection Time: 03/30/20 12:04 PM   Specimen: Nasopharyngeal Swab  Result Value Ref Range Status   SARS Coronavirus 2 NEGATIVE NEGATIVE Final    Comment: (NOTE) SARS-CoV-2 target nucleic acids are NOT DETECTED.  The SARS-CoV-2 RNA is generally detectable in upper and lower respiratory specimens during the acute phase of infection. The lowest concentration of SARS-CoV-2 viral copies this assay can detect is 250 copies / mL. A negative result does not preclude SARS-CoV-2 infection and should not be used as the sole basis for treatment or other patient management decisions.  A negative result may occur with improper specimen collection / handling, submission of specimen other than nasopharyngeal swab, presence of viral mutation(s) within the areas targeted by this assay, and inadequate number of viral copies (<250 copies / mL). A negative result must be combined with clinical observations, patient history, and epidemiological information.  Fact Sheet for Patients:   BoilerBrush.com.cy  Fact Sheet for Healthcare Providers: https://pope.com/  This test is not yet approved or  cleared by the [place] FDA and has been authorized for detection and/or diagnosis of SARS-CoV-2 by FDA under an Emergency Use Authorization (EUA).  This EUA will remain in  effect (meaning this test can be used) for the duration of the COVID-19 declaration under Section 564(b)(1) of the Act, 21 U.S.C. section 360bbb-3(b)(1), unless the authorization is terminated or revoked sooner.  Performed at [facility], [address], [place], [place] [address]   Culture, Urine     Status: Abnormal   Collection Time: 03/30/20 12:11 PM   Specimen: Urine, Random  Result Value Ref Range Status   Specimen Description   Final    URINE, RANDOM Performed at [facility], [address] [place], [place] [address]    Special Requests   Final    NONE Performed at [facility], [address], [place], [place] [address]    Culture >=100,000 COLONIES/mL ESCHERICHIA COLI (A)  Final   Report Status 04/01/2020 FINAL  Final   Organism ID, Bacteria ESCHERICHIA COLI (A)  Final      Susceptibility   Escherichia coli - MIC*    AMPICILLIN 4 SENSITIVE Sensitive     CEFAZOLIN <=4 SENSITIVE Sensitive     CEFTRIAXONE <=0.25 SENSITIVE Sensitive     CIPROFLOXACIN <=0.25 SENSITIVE Sensitive     GENTAMICIN <=1 SENSITIVE Sensitive     IMIPENEM <=0.25 SENSITIVE Sensitive     NITROFURANTOIN 32 SENSITIVE Sensitive     TRIMETH/SULFA <=20 SENSITIVE Sensitive     AMPICILLIN/SULBACTAM <=2 SENSITIVE Sensitive     PIP/TAZO <=4 SENSITIVE Sensitive     * >=100,000 COLONIES/mL ESCHERICHIA COLI  MRSA PCR Screening     Status: None   Collection Time: 03/30/20 12:35 PM   Specimen: Nasal Mucosa; Nasopharyngeal  Result Value Ref Range Status   MRSA by PCR NEGATIVE NEGATIVE Final    Comment:        The GeneXpert MRSA Assay (FDA approved for NASAL specimens only), is one component of a comprehensive MRSA colonization surveillance program. It is not intended to diagnose MRSA infection nor to guide or monitor treatment for MRSA infections. Performed at [facility], [address], [place], [place] [address]   Culture, blood (Routine X 2) w Reflex to ID Panel     Status: None (Preliminary result)     Collection Time: 03/31/20  5:20  PM   Specimen: BLOOD  Result Value Ref Range Status   Specimen Description BLOOD LEFT ANTECUBITAL  Final   Special Requests   Final    BOTTLES DRAWN AEROBIC AND ANAEROBIC Blood Culture adequate volume   Culture   Final    NO GROWTH 2 DAYS Performed at [facility], [address] [place], [place] [address]    Report Status PENDING  Incomplete  Culture, blood (Routine X 2) w Reflex to ID Panel     Status: None (Preliminary result)   Collection Time: 03/31/20  5:28 PM   Specimen: BLOOD  Result Value Ref Range Status   Specimen Description BLOOD BLOOD RIGHT WRIST  Final   Special Requests   Final    AEROBIC BOTTLE ONLY Blood Culture results may not be optimal due to an inadequate volume of blood received in culture bottles   Culture   Final    NO GROWTH 2 DAYS Performed at [facility], [address] [place], [place] [address]    Report Status PENDING  Incomplete     Radiology Studies: DG CHEST PORT 1 VIEW  Result Date: 04/01/2020 CLINICAL DATA:  Fever. EXAM: PORTABLE CHEST 1 VIEW COMPARISON:  03/30/2020. FINDINGS: Mediastinum and hilar structures normal. Heart size stable. Low lung volumes. Mild left base atelectasis/infiltrate. No pleural effusion or pneumothorax. No acute bony abnormality. IMPRESSION: Low lung volumes with mild left base atelectasis/infiltrate. Similar findings noted on prior exam. Electronically Signed   By: Maisie Fushomas  Register   On: 04/01/2020 05:27   Scheduled Meds: . aspirin EC  81 mg Oral Daily  . atorvastatin  20 mg Oral q1800  . carvedilol  25 mg Oral BID WC  . Chlorhexidine Gluconate Cloth  6 each Topical Daily  . cholecalciferol  2,000 Units Oral q morning - 10a  . enoxaparin (LOVENOX) injection  55 mg Subcutaneous Q24H  . folic acid  1 mg Oral Daily  . insulin aspart  0-9 Units Subcutaneous Q6H  . magnesium oxide  800 mg Oral BID  . pantoprazole (PROTONIX) IV  40 mg Intravenous Q24H  . tamsulosin  0.4 mg Oral QPC  supper  . thiamine  100 mg Oral Daily   Continuous Infusions: . cefTRIAXone (ROCEPHIN)  IV Stopped (04/01/20 1756)  . phenytoin (DILANTIN) IV 250 mg (04/02/20 1049)     LOS: 3 days   Critical Care Procedure Note Authorized and Performed by: Maryln Manuel. Emerlyn Mehlhoff MD  Total Critical Care time:  33 minutes  Due to a high probability of clinically significant, life threatening deterioration, the patient required my highest level of preparedness to intervene emergently and I personally spent this critical care time directly and personally managing the patient.  This critical care time included obtaining a history; examining the patient, pulse oximetry; ordering and review of studies; arranging urgent treatment with development of a management plan; evaluation of patient's response of treatment; frequent reassessment; and discussions with other providers.  This critical care time was performed to assess and manage the high probability of imminent and life threatening deterioration that could result in multi-organ failure.  It was exclusive of separately billable procedures and treating other patients and teaching time.    Standley Dakinslanford Cadyn Rodger, MD How to contact the [facility] Attending or Consulting provider 7A - 7P or covering provider during after hours 7P -7A, for this patient?  1. Check the care team in [facility] and look for a) attending/consulting [facility] provider listed and b) the [facility] team listed 2. Log into [email or web address] and  use [place]'s universal password to access. If you do not have the password, please contact the hospital operator. 3. Locate the [facility] provider you are looking for under [facility] and page to a number that you can be directly reached. 4. If you still have difficulty reaching the provider, please page the [facility] (Director on Call) for the Hospitalists listed on amion for assistance.  04/02/2020, 11:20 AM

## 2020-04-02 NOTE — Progress Notes (Signed)
Patient ID: Brad Graham, male   DOB: 26-May-1961, 59 y.o.   MRN: [id number] S: Feels better.  Awake and alert, eating lunch in bed. O:BP 137/82   Pulse 79   Temp 98.1 F (36.7 C) (Oral)   Resp 17   Ht 6' (1.829 m)   Wt 110.6 kg   SpO2 100%   BMI 33.07 kg/m   Intake/Output Summary (Last 24 hours) at 04/02/2020 1236 Last data filed at 04/02/2020 0900 Gross per 24 hour  Intake 726.59 ml  Output 3350 ml  Net -2623.41 ml   Intake/Output: I/O last 3 completed shifts: In: 544.9 [P.O.:250; IV Piggyback:294.9] Out: 6752 [Urine:6750; Stool:2]  Intake/Output this shift:  Total I/O In: 431.7 [P.O.:360; IV Piggyback:71.7] Out: -  Weight change: -1 kg Gen: NAD CVS: no rub Resp: occ rhonchi bilaterally Abd: +BS, soft, NT/ND Ext: 2+ BLE to ankles/feet  Recent Labs  Lab 03/30/20 0949 03/30/20 1346 03/31/20 0641 03/31/20 0641 03/31/20 0940 03/31/20 1429 03/31/20 1529 03/31/20 2215 04/01/20 0458 04/01/20 1357 04/02/20 0436  NA 109*   < > 112*  118*   < > 118* 136 120* 124*  123* 121* 122* 126*  K 3.5   < > 3.7  --  3.4*  --  3.2* 3.5 3.5 4.1 3.6  CL 74*   < > 77*  --  83*  --  85* 86* 85* 87* 87*  CO2 22   < > 20*  --  21*  --  24 24 26 25 28   GLUCOSE 90   < > 64*  --  74  --  100* 95 162* 172* 101*  BUN 5*   < > 5*  --  5*  --  5* 5* <5* 5* <5*  CREATININE 0.74   < > 0.71  --  0.80  --  0.78 0.74 0.70 0.77 0.74  ALBUMIN 2.7*  --   --   --   --   --   --   --   --   --   --   CALCIUM 8.3*   < > 8.2*  --  8.1*  --  8.2* 8.5* 8.2* 8.1* 8.6*  AST 17  --   --   --   --   --   --   --   --   --   --   ALT 13  --   --   --   --   --   --   --   --   --   --    < > = values in this interval not displayed.   Liver Function Tests: Recent Labs  Lab 03/30/20 0949  AST 17  ALT 13  ALKPHOS 172*  BILITOT 0.8  PROT 6.9  ALBUMIN 2.7*   No results for input(s): LIPASE, AMYLASE in the last 168 hours. No results for input(s): AMMONIA in the last 168 hours. CBC: Recent Labs  Lab  03/30/20 0949 03/30/20 0949 03/31/20 0341 04/01/20 0458 04/02/20 0436  WBC 5.3   < > 6.9 4.4 3.1*  NEUTROABS  --   --  5.0 2.9 1.5*  HGB 9.8*   < > 8.9* 8.4* 8.6*  HCT 28.5*   < > 26.8* 26.2* 26.9*  MCV 81.0  --  82.7 83.7 85.4  PLT 244   < > 269 246 286   < > = values in this interval not displayed.   Cardiac Enzymes: No results for input(s): CKTOTAL, CKMB,  CKMBINDEX, TROPONINI in the last 168 hours. CBG: Recent Labs  Lab 04/01/20 2001 04/02/20 0040 04/02/20 0632 04/02/20 0804 04/02/20 1131  GLUCAP 113* 111* 141* 106* 138*    Iron Studies: No results for input(s): IRON, TIBC, TRANSFERRIN, FERRITIN in the last 72 hours. Studies/Results: DG CHEST PORT 1 VIEW  Result Date: 04/01/2020 CLINICAL DATA:  Fever. EXAM: PORTABLE CHEST 1 VIEW COMPARISON:  03/30/2020. FINDINGS: Mediastinum and hilar structures normal. Heart size stable. Low lung volumes. Mild left base atelectasis/infiltrate. No pleural effusion or pneumothorax. No acute bony abnormality. IMPRESSION: Low lung volumes with mild left base atelectasis/infiltrate. Similar findings noted on prior exam. Electronically Signed   By: Maisie Fus  Register   On: 04/01/2020 05:27   . aspirin EC  81 mg Oral Daily  . atorvastatin  20 mg Oral q1800  . carvedilol  25 mg Oral BID WC  . Chlorhexidine Gluconate Cloth  6 each Topical Daily  . cholecalciferol  2,000 Units Oral q morning - 10a  . enoxaparin (LOVENOX) injection  55 mg Subcutaneous Q24H  . folic acid  1 mg Oral Daily  . insulin aspart  0-9 Units Subcutaneous Q6H  . magnesium oxide  800 mg Oral BID  . pantoprazole (PROTONIX) IV  40 mg Intravenous Q24H  . tamsulosin  0.4 mg Oral QPC supper  . thiamine  100 mg Oral Daily    BMET    Component Value Date/Time   NA 126 (L) 04/02/2020 0436   NA 137 02/16/2020 1426   NA 126 (L) 05/02/2014 0921   K 3.6 04/02/2020 0436   K 3.6 05/02/2014 0921   CL 87 (L) 04/02/2020 0436   CL 93 (L) 05/02/2014 0921   CO2 28 04/02/2020 0436    CO2 21 05/02/2014 0921   GLUCOSE 101 (H) 04/02/2020 0436   GLUCOSE 106 (H) 05/02/2014 0921   BUN <5 (L) 04/02/2020 0436   BUN 28 (H) 02/16/2020 1426   BUN 19 (H) 05/02/2014 0921   CREATININE 0.74 04/02/2020 0436   CREATININE 1.22 05/02/2014 0921   CALCIUM 8.6 (L) 04/02/2020 0436   CALCIUM 8.5 05/02/2014 0921   GFRNONAA >60 04/02/2020 0436   GFRNONAA >60 05/02/2014 0921   GFRAA >60 04/02/2020 0436   GFRAA >60 05/02/2014 0921   CBC    Component Value Date/Time   WBC 3.1 (L) 04/02/2020 0436   RBC 3.15 (L) 04/02/2020 0436   HGB 8.6 (L) 04/02/2020 0436   HGB 11.7 (L) 02/16/2020 1426   HCT 26.9 (L) 04/02/2020 0436   HCT 35.0 (L) 02/16/2020 1426   PLT 286 04/02/2020 0436   PLT 192 02/16/2020 1426   MCV 85.4 04/02/2020 0436   MCV 88 02/16/2020 1426   MCV 94 05/02/2014 0134   MCH 27.3 04/02/2020 0436   MCHC 32.0 04/02/2020 0436   RDW 14.0 04/02/2020 0436   RDW 13.3 02/16/2020 1426   RDW 13.8 05/02/2014 0134   LYMPHSABS 1.0 04/02/2020 0436   LYMPHSABS 1.0 02/16/2020 1426   MONOABS 0.6 04/02/2020 0436   EOSABS 0.1 04/02/2020 0436   EOSABS 0.0 02/16/2020 1426   BASOSABS 0.0 04/02/2020 0436   BASOSABS 0.1 02/16/2020 1426     Assessment/Plan:  1. Hyponatremia, severe- due to acute on chronic diastolic CHF with volume overload.  Initially treated with 3% saline for AMS with improvement and now responding to IV lasix.  Sodium level improving.  Continue with IV lasix and follow serial sodium levels 2. Hypokalemia- improved with repletion 3. Hypomagnesemia- likely due to poor  oral intake and malnutrition- improved with repletion 4. Acute on chronic diastolic CHF- responding to IV lasix 5. E. Coli UTI- on ceftriaxone. 6. H/o seizure disorder- on dilantin 7. Etoh abuse- abstinent for past several weeks while resident at [facility] 8. HTN- stable  Irena Cords, MD [employer] [phone]

## 2020-04-02 NOTE — Progress Notes (Signed)
Linton Flemings, NP notified of CBG orders AC & HS and 3AM with insulin sliding scale ordered every 6 hours.  Requested reconsilliation of orders.  Reviewed situation with Neurosurgeon when she made rounds.  Will follow every 6 hour pattern for now until orders are reconciled

## 2020-04-03 LAB — GLUCOSE, CAPILLARY
Glucose-Capillary: 108 mg/dL — ABNORMAL HIGH (ref 70–99)
Glucose-Capillary: 114 mg/dL — ABNORMAL HIGH (ref 70–99)
Glucose-Capillary: 132 mg/dL — ABNORMAL HIGH (ref 70–99)
Glucose-Capillary: 145 mg/dL — ABNORMAL HIGH (ref 70–99)

## 2020-04-03 LAB — BASIC METABOLIC PANEL
Anion gap: 9 (ref 5–15)
BUN: 6 mg/dL — ABNORMAL LOW (ref 8–23)
CO2: 30 mmol/L (ref 22–32)
Calcium: 8.4 mg/dL — ABNORMAL LOW (ref 8.9–10.3)
Chloride: 83 mmol/L — ABNORMAL LOW (ref 98–111)
Creatinine, Ser: 0.7 mg/dL (ref 0.61–1.24)
GFR calc Af Amer: >60 mL/min (ref >60)
GFR calc non Af Amer: >60 mL/min (ref >60)
Glucose, Bld: 110 mg/dL — ABNORMAL HIGH (ref 70–99)
Potassium: 3.6 mmol/L (ref 3.5–5.1)
Sodium: 122 mmol/L — ABNORMAL LOW (ref 135–145)

## 2020-04-03 LAB — CBC WITH DIFFERENTIAL/PLATELET
Abs Immature Granulocytes: 0.03 10*3/uL (ref 0.00–0.07)
Basophils Absolute: 0.1 10*3/uL (ref 0.0–0.1)
Basophils Relative: 1 %
Eosinophils Absolute: 0.1 10*3/uL (ref 0.0–0.5)
Eosinophils Relative: 3 %
HCT: 25.5 % — ABNORMAL LOW (ref 39.0–52.0)
Hemoglobin: 8.3 g/dL — ABNORMAL LOW (ref 13.0–17.0)
Immature Granulocytes: 1 %
Lymphocytes Relative: 33 %
Lymphs Abs: 1.3 10*3/uL (ref 0.7–4.0)
MCH: 27.6 pg (ref 26.0–34.0)
MCHC: 32.5 g/dL (ref 30.0–36.0)
MCV: 84.7 fL (ref 80.0–100.0)
Monocytes Absolute: 0.6 10*3/uL (ref 0.1–1.0)
Monocytes Relative: 15 %
Neutro Abs: 1.8 10*3/uL (ref 1.7–7.7)
Neutrophils Relative %: 47 %
Platelets: 282 10*3/uL (ref 150–400)
RBC: 3.01 MIL/uL — ABNORMAL LOW (ref 4.22–5.81)
RDW: 14.2 % (ref 11.5–15.5)
WBC: 3.9 10*3/uL — ABNORMAL LOW (ref 4.0–10.5)
nRBC: 0 % (ref 0.0–0.2)

## 2020-04-03 LAB — MAGNESIUM: Magnesium: 1.6 mg/dL — ABNORMAL LOW (ref 1.7–2.4)

## 2020-04-03 MED ORDER — BISACODYL 10 MG RE SUPP
10.0000 mg | Freq: Every day | RECTAL | Status: DC | PRN
  Administered 2020-04-03: 10 mg via RECTAL
  Filled 2020-04-03: qty 1

## 2020-04-03 MED ORDER — SENNOSIDES-DOCUSATE SODIUM 8.6-50 MG PO TABS
1.0000 | ORAL_TABLET | Freq: Every day | ORAL | Status: DC
  Administered 2020-04-03: 1 via ORAL
  Filled 2020-04-03: qty 1

## 2020-04-03 MED ORDER — MAGNESIUM SULFATE 4 GM/100ML IV SOLN
4.0000 g | Freq: Once | INTRAVENOUS | Status: AC
  Administered 2020-04-03: 4 g via INTRAVENOUS
  Filled 2020-04-03: qty 100

## 2020-04-03 MED ORDER — INSULIN ASPART 100 UNIT/ML ~~LOC~~ SOLN
0.0000 [IU] | Freq: Three times a day (TID) | SUBCUTANEOUS | Status: DC
  Administered 2020-04-03 – 2020-04-05 (×5): 1 [IU] via SUBCUTANEOUS
  Administered 2020-04-05: 2 [IU] via SUBCUTANEOUS
  Administered 2020-04-06 (×2): 1 [IU] via SUBCUTANEOUS

## 2020-04-03 NOTE — Progress Notes (Signed)
PROGRESS NOTE   Brad DuttonDwayne A Graham  WUJ:[id number] DOB: 06/19/1961 DOA: 03/30/2020 PCP: Wells GuilesAsaro, Jessica I, NP   Chief Complaint  Patient presents with  . Altered Mental Status    Brief Admission History:  59 y.o. male with medical history significant for a recent hospitalization at [facility] for cardiac arrest requiring intubation acute renal failure aspiration pneumonia who was treated in the ICU for several days and subsequently extubated and eventually discharged to [facility].  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  He has a history of chronic alcohol abuse and he went through alcohol withdrawal during last hospitalization.  He has a chronic seizure disorder and is maintained on Dilantin.  He has type 2 diabetes mellitus and hypertension.  These are treated with oral medications.  He was admitted with acute mental status changes and severe hyponatremia with sodium of 109.     Assessment & Plan:   Principal Problem:   Hyponatremia Active Problems:   Essential hypertension   Diabetes mellitus without complication (HCC)   Hyperlipidemia   Proteinuria   History of Cardiopulmonary arrest    Acute encephalopathy   Hyperglycemia   Volume overload   Alcohol abuse   Centrencephalic epilepsy (HCC)   Hemiparesis affecting left side as late effect of cerebrovascular accident   Bilateral leg edema   Sinus tachycardia   UTI (urinary tract infection)   DNR (do not resuscitate)  1. Severe hyponatremia - Slowly Improving with treatment.  His sodium is about 122 now and he remains volume overloaded but is diuresing nicely now about 16 L since admission, continue diuresis per nephrology team.  Pt presented with acute on chronic hyponatremia, with a marked drop in sodium from recent testing.  Sodium was 130 about 2 weeks ago and was down to a critically low level of 109 and he was symptomatic.  He was initially on hypertonic saline but now  discontinued.    Nephrology is following.  His mentation is improved.  Transfer to telemetry.    2. DNR - present on admission.  3. Metabolic encephalopathy - improved.   4. Volume overload - improving but not resolved, IV lasix per nephrology team.   He has diuresed over 16L since admission.  5. Epilepsy - resumed dilantin. No seizure activity seen.  6. Type 2 Diabetes mellitus with vascular complications - SSI coverage and frequent CBG testing.    7. Alcohol abuse - He has been dry for the past several weeks while a resident at [facility] and presented with negative alcohol levels.  Resume oral vitamin supplements.  8. Hypomagnesemia - severe - continue IV replacement and follow.  9. Sinus tachycardia - resolved now.  His carvedilol has been restarted.   10. Essential hypertension - resuming home BP meds.    11. E coli UTI - treated with IV ceftriaxone.   DVT prophylaxis: lovenox   Code Status: DNR   Family Communication:  sister Eber JonesCarolyn updated telephon 7/8, I attempted to call all contacts listed 7/10 no answer [facility]  Consults called: nephrology   Admission status: INP   Status is: Inpatient  Remains inpatient appropriate because:Persistent severe electrolyte disturbances, IV treatments appropriate due to intensity of illness or inability to take PO and Inpatient level of care appropriate due to severity of illness.  Sodium level remains critically low and is slowly being corrected.  Pt remains volume overloaded and now requiring IV lasix for diuresis.  Dispo: The patient is from: SNF              Anticipated d/c is to: SNF              Anticipated d/c date is: 2 days              Patient currently is not medically stable to d/c. Consultants:   Nephrology   Procedures:   Antimicrobials:  Ceftriaxone 7/7>>   Subjective: Pt says he is starting to feel himself again.  He has no specific complaints today.    Objective: Vitals:   04/03/20 0500  04/03/20 0600 04/03/20 0700 04/03/20 0800  BP: (!) 141/79 138/72 133/77 126/70  Pulse: 92 89 83 92  Resp: 16 18 17  (!) 21  Temp:      TempSrc:      SpO2: 99% 96% 97% 99%  Weight: 107.1 kg     Height:        Intake/Output Summary (Last 24 hours) at 04/03/2020 1022 Last data filed at 04/03/2020 0500 Gross per 24 hour  Intake 1329.72 ml  Output 4800 ml  Net -3470.28 ml   Filed Weights   04/01/20 0500 04/02/20 0510 04/03/20 0500  Weight: 111.6 kg 110.6 kg 107.1 kg   Examination:  General exam: awake, alert, Appears calm and comfortable, speaking in normal sentences  Respiratory system: crackles heard bilateral. Respiratory effort normal. Cardiovascular system:  normal S1 & S2 heard. No JVD, murmurs, rubs, gallops or clicks. Trace pedal edema. Gastrointestinal system: Abdomen is nondistended, soft and nontender. No organomegaly or masses felt. Normal bowel sounds heard. Central nervous system: Alert and oriented. No focal neurological deficits. Extremities: Symmetric 5 x 5 power.  TED hoses BLEs.  Skin: trace edema BLEs, No rashes, lesions or ulcers, diffuse onychomycosis.  Psychiatry: Judgement and insight appear normal. Mood & affect appropriate.   Data Reviewed: I have personally reviewed following labs and imaging studies  CBC: Recent Labs  Lab 03/30/20 0949 03/31/20 0341 04/01/20 0458 04/02/20 0436 04/03/20 0530  WBC 5.3 6.9 4.4 3.1* 3.9*  NEUTROABS  --  5.0 2.9 1.5* 1.8  HGB 9.8* 8.9* 8.4* 8.6* 8.3*  HCT 28.5* 26.8* 26.2* 26.9* 25.5*  MCV 81.0 82.7 83.7 85.4 84.7  PLT 244 269 246 286 282   Basic Metabolic Panel: Recent Labs  Lab 03/31/20 0341 03/31/20 0641 03/31/20 2215 04/01/20 0458 04/01/20 1357 04/02/20 0436 04/03/20 0530  NA 118*   < > 124*  123* 121* 122* 126* 122*  K 3.1*   < > 3.5 3.5 4.1 3.6 3.6  CL 81*   < > 86* 85* 87* 87* 83*  CO2 21*   < > 24 26 25 28 30   GLUCOSE 74   < > 95 162* 172* 101* 110*  BUN 5*   < > 5* <5* 5* <5* 6*  CREATININE  0.77   < > 0.74 0.70 0.77 0.74 0.70  CALCIUM 8.2*   < > 8.5* 8.2* 8.1* 8.6* 8.4*  MG 1.0*  --   --  1.4*  --  1.6* 1.6*   < > = values in this interval not displayed.   GFR: Estimated Creatinine Clearance: 122.6 mL/min (by C-G formula based on SCr of 0.7 mg/dL).  Liver Function Tests: Recent Labs  Lab 03/30/20 0949  AST 17  ALT 13  ALKPHOS 172*  BILITOT 0.8  PROT 6.9  ALBUMIN 2.7*   CBG: Recent Labs  Lab 04/02/20 0804 04/02/20 1131 04/02/20 1651 04/02/20 2342  04/03/20 0620  GLUCAP 106* 138* 124* 109* 108*    Recent Results (from the past 240 hour(s))  SARS Coronavirus 2 by RT PCR (hospital order, performed in [facility] hospital lab) Nasopharyngeal Nasopharyngeal Swab     Status: None   Collection Time: 03/30/20 12:04 PM   Specimen: Nasopharyngeal Swab  Result Value Ref Range Status   SARS Coronavirus 2 NEGATIVE NEGATIVE Final    Comment: (NOTE) SARS-CoV-2 target nucleic acids are NOT DETECTED.  The SARS-CoV-2 RNA is generally detectable in upper and lower respiratory specimens during the acute phase of infection. The lowest concentration of SARS-CoV-2 viral copies this assay can detect is 250 copies / mL. A negative result does not preclude SARS-CoV-2 infection and should not be used as the sole basis for treatment or other patient management decisions.  A negative result may occur with improper specimen collection / handling, submission of specimen other than nasopharyngeal swab, presence of viral mutation(s) within the areas targeted by this assay, and inadequate number of viral copies (<250 copies / mL). A negative result must be combined with clinical observations, patient history, and epidemiological information.  Fact Sheet for Patients:   BoilerBrush.com.cy  Fact Sheet for Healthcare Providers: https://pope.com/  This test is not yet approved or  cleared by the [place] FDA and has been authorized  for detection and/or diagnosis of SARS-CoV-2 by FDA under an Emergency Use Authorization (EUA).  This EUA will remain in effect (meaning this test can be used) for the duration of the COVID-19 declaration under Section 564(b)(1) of the Act, 21 U.S.C. section 360bbb-3(b)(1), unless the authorization is terminated or revoked sooner.  Performed at [facility], [address], [place], [place] [address]   Culture, Urine     Status: Abnormal   Collection Time: 03/30/20 12:11 PM   Specimen: Urine, Random  Result Value Ref Range Status   Specimen Description   Final    URINE, RANDOM Performed at [facility], [address] [place], [place] [address]    Special Requests   Final    NONE Performed at [facility], [address] [place], [place] [address]    Culture >=100,000 COLONIES/mL ESCHERICHIA COLI (A)  Final   Report Status 04/01/2020 FINAL  Final   Organism ID, Bacteria ESCHERICHIA COLI (A)  Final      Susceptibility   Escherichia coli - MIC*    AMPICILLIN 4 SENSITIVE Sensitive     CEFAZOLIN <=4 SENSITIVE Sensitive     CEFTRIAXONE <=0.25 SENSITIVE Sensitive     CIPROFLOXACIN <=0.25 SENSITIVE Sensitive     GENTAMICIN <=1 SENSITIVE Sensitive     IMIPENEM <=0.25 SENSITIVE Sensitive     NITROFURANTOIN 32 SENSITIVE Sensitive     TRIMETH/SULFA <=20 SENSITIVE Sensitive     AMPICILLIN/SULBACTAM <=2 SENSITIVE Sensitive     PIP/TAZO <=4 SENSITIVE Sensitive     * >=100,000 COLONIES/mL ESCHERICHIA COLI  MRSA PCR Screening     Status: None   Collection Time: 03/30/20 12:35 PM   Specimen: Nasal Mucosa; Nasopharyngeal  Result Value Ref Range Status   MRSA by PCR NEGATIVE NEGATIVE Final    Comment:        The GeneXpert MRSA Assay (FDA approved for NASAL specimens only), is one component of a comprehensive MRSA colonization surveillance program. It is not intended to diagnose MRSA infection nor to guide or monitor treatment for MRSA infections. Performed at [facility], [address], [place], [place] [address]   Culture, blood (Routine X 2)  w Reflex to ID Panel     Status: None (Preliminary result)   Collection Time: 03/31/20  5:20 PM   Specimen: BLOOD  Result Value Ref Range Status   Specimen Description BLOOD LEFT ANTECUBITAL  Final   Special Requests   Final    BOTTLES DRAWN AEROBIC AND ANAEROBIC Blood Culture adequate volume   Culture   Final    NO GROWTH 2 DAYS Performed at [facility], [address] [place], [place] [address]    Report Status PENDING  Incomplete  Culture, blood (Routine X 2) w Reflex to ID Panel     Status: None (Preliminary result)   Collection Time: 03/31/20  5:28 PM   Specimen: BLOOD  Result Value Ref Range Status   Specimen Description BLOOD BLOOD RIGHT WRIST  Final   Special Requests   Final    AEROBIC BOTTLE ONLY Blood Culture results may not be optimal due to an inadequate volume of blood received in culture bottles   Culture   Final    NO GROWTH 2 DAYS Performed at [facility], [address], [place], [place] [address]    Report Status PENDING  Incomplete     Radiology Studies: No results found. Scheduled Meds: . aspirin EC  81 mg Oral Daily  . atorvastatin  20 mg Oral q1800  . carvedilol  25 mg Oral BID WC  . Chlorhexidine Gluconate Cloth  6 each Topical Daily  . cholecalciferol  2,000 Units Oral q morning - 10a  . enoxaparin (LOVENOX) injection  55 mg Subcutaneous Q24H  . folic acid  1 mg Oral Daily  . furosemide  40 mg Intravenous Q12H  . insulin aspart  0-9 Units Subcutaneous TID WC  . magnesium oxide  800 mg Oral BID  . pantoprazole (PROTONIX) IV  40 mg Intravenous Q24H  . sodium chloride flush  3 mL Intravenous Q12H  . tamsulosin  0.4 mg Oral QPC supper  . thiamine  100 mg Oral Daily   Continuous Infusions: . phenytoin (DILANTIN) IV Stopped (04/02/20 2243)    LOS: 4 days   Standley Dakins, MD How to contact the [facility] Attending or Consulting provider 7A - 7P or covering provider during after hours 7P  -7A, for this patient?  1. Check the care team in [facility] and look for a) attending/consulting TRH provider listed and b) the [facility] team listed 2. Log into [email or web address] and use [facility]'s universal password to access. If you do not have the password, please contact the hospital operator. 3. Locate the [facility] provider you are looking for under [facility] and page to a number that you can be directly reached. 4. If you still have difficulty reaching the provider, please page the [facility] (Director on Call) for the Hospitalists listed on amion for assistance.  04/03/2020, 10:22 AM

## 2020-04-03 NOTE — Progress Notes (Signed)
Patient ID: Brad Graham, male   DOB: December 12, 1960, 59 y.o.   MRN: [id number] S: Feels well, no complaints O:BP 136/74   Pulse 90   Temp 98.4 F (36.9 C) (Oral)   Resp 18   Ht 6' (1.829 m)   Wt 107.1 kg   SpO2 100%   BMI 32.02 kg/m   Intake/Output Summary (Last 24 hours) at 04/03/2020 1312 Last data filed at 04/03/2020 1100 Gross per 24 hour  Intake 1789.72 ml  Output 4800 ml  Net -3010.28 ml   Intake/Output: I/O last 3 completed shifts: In: 2056.3 [P.O.:1520; I.V.:3; IV Piggyback:533.3] Out: 7300 [Urine:7300]  Intake/Output this shift:  Total I/O In: 460 [P.O.:360; IV Piggyback:100] Out: -  Weight change: -3.5 kg Gen: NAD CVS: no rub Resp: cta Abd: benign Ext: trace pedal edema  Recent Labs  Lab 03/30/20 0949 03/30/20 1346 03/31/20 0940 03/31/20 0940 03/31/20 1429 03/31/20 1529 03/31/20 2215 04/01/20 0458 04/01/20 1357 04/02/20 0436 04/03/20 0530  NA 109*   < > 118*   < > 136 120* 124*  123* 121* 122* 126* 122*  K 3.5   < > 3.4*  --   --  3.2* 3.5 3.5 4.1 3.6 3.6  CL 74*   < > 83*  --   --  85* 86* 85* 87* 87* 83*  CO2 22   < > 21*  --   --  24 24 26 25 28 30   GLUCOSE 90   < > 74  --   --  100* 95 162* 172* 101* 110*  BUN 5*   < > 5*  --   --  5* 5* <5* 5* <5* 6*  CREATININE 0.74   < > 0.80  --   --  0.78 0.74 0.70 0.77 0.74 0.70  ALBUMIN 2.7*  --   --   --   --   --   --   --   --   --   --   CALCIUM 8.3*   < > 8.1*  --   --  8.2* 8.5* 8.2* 8.1* 8.6* 8.4*  AST 17  --   --   --   --   --   --   --   --   --   --   ALT 13  --   --   --   --   --   --   --   --   --   --    < > = values in this interval not displayed.   Liver Function Tests: Recent Labs  Lab 03/30/20 0949  AST 17  ALT 13  ALKPHOS 172*  BILITOT 0.8  PROT 6.9  ALBUMIN 2.7*   No results for input(s): LIPASE, AMYLASE in the last 168 hours. No results for input(s): AMMONIA in the last 168 hours. CBC: Recent Labs  Lab 03/30/20 0949 03/30/20 0949 03/31/20 0341 03/31/20 0341  04/01/20 0458 04/02/20 0436 04/03/20 0530  WBC 5.3   < > 6.9   < > 4.4 3.1* 3.9*  NEUTROABS  --   --  5.0   < > 2.9 1.5* 1.8  HGB 9.8*   < > 8.9*   < > 8.4* 8.6* 8.3*  HCT 28.5*   < > 26.8*   < > 26.2* 26.9* 25.5*  MCV 81.0  --  82.7  --  83.7 85.4 84.7  PLT 244   < > 269   < > 246 286 282   < > =  values in this interval not displayed.   Cardiac Enzymes: No results for input(s): CKTOTAL, CKMB, CKMBINDEX, TROPONINI in the last 168 hours. CBG: Recent Labs  Lab 04/02/20 1131 04/02/20 1651 04/02/20 2342 04/03/20 0620 04/03/20 1205  GLUCAP 138* 124* 109* 108* 114*    Iron Studies: No results for input(s): IRON, TIBC, TRANSFERRIN, FERRITIN in the last 72 hours. Studies/Results: No results found. Marland Kitchen aspirin EC  81 mg Oral Daily  . atorvastatin  20 mg Oral q1800  . carvedilol  25 mg Oral BID WC  . Chlorhexidine Gluconate Cloth  6 each Topical Daily  . cholecalciferol  2,000 Units Oral q morning - 10a  . enoxaparin (LOVENOX) injection  55 mg Subcutaneous Q24H  . folic acid  1 mg Oral Daily  . insulin aspart  0-9 Units Subcutaneous TID WC  . magnesium oxide  800 mg Oral BID  . pantoprazole (PROTONIX) IV  40 mg Intravenous Q24H  . senna-docusate  1 tablet Oral QHS  . sodium chloride flush  3 mL Intravenous Q12H  . tamsulosin  0.4 mg Oral QPC supper  . thiamine  100 mg Oral Daily    BMET    Component Value Date/Time   NA 122 (L) 04/03/2020 0530   NA 137 02/16/2020 1426   NA 126 (L) 05/02/2014 0921   K 3.6 04/03/2020 0530   K 3.6 05/02/2014 0921   CL 83 (L) 04/03/2020 0530   CL 93 (L) 05/02/2014 0921   CO2 30 04/03/2020 0530   CO2 21 05/02/2014 0921   GLUCOSE 110 (H) 04/03/2020 0530   GLUCOSE 106 (H) 05/02/2014 0921   BUN 6 (L) 04/03/2020 0530   BUN 28 (H) 02/16/2020 1426   BUN 19 (H) 05/02/2014 0921   CREATININE 0.70 04/03/2020 0530   CREATININE 1.22 05/02/2014 0921   CALCIUM 8.4 (L) 04/03/2020 0530   CALCIUM 8.5 05/02/2014 0921   GFRNONAA >60 04/03/2020 0530    GFRNONAA >60 05/02/2014 0921   GFRAA >60 04/03/2020 0530   GFRAA >60 05/02/2014 0921   CBC    Component Value Date/Time   WBC 3.9 (L) 04/03/2020 0530   RBC 3.01 (L) 04/03/2020 0530   HGB 8.3 (L) 04/03/2020 0530   HGB 11.7 (L) 02/16/2020 1426   HCT 25.5 (L) 04/03/2020 0530   HCT 35.0 (L) 02/16/2020 1426   PLT 282 04/03/2020 0530   PLT 192 02/16/2020 1426   MCV 84.7 04/03/2020 0530   MCV 88 02/16/2020 1426   MCV 94 05/02/2014 0134   MCH 27.6 04/03/2020 0530   MCHC 32.5 04/03/2020 0530   RDW 14.2 04/03/2020 0530   RDW 13.3 02/16/2020 1426   RDW 13.8 05/02/2014 0134   LYMPHSABS 1.3 04/03/2020 0530   LYMPHSABS 1.0 02/16/2020 1426   MONOABS 0.6 04/03/2020 0530   EOSABS 0.1 04/03/2020 0530   EOSABS 0.0 02/16/2020 1426   BASOSABS 0.1 04/03/2020 0530   BASOSABS 0.1 02/16/2020 1426     Assessment/Plan:  1. Hyponatremia, severe- due to acute on chronic diastolic CHF with volume overload.  Initially treated with 3% saline for AMS with improvement and now responding to IV lasix.  Sodium level improved to 126 04/02/20 but dropped again to 120.   1. Given IV lasix 40 mg today with significant improvement of edema but drop in sodium 2. Hold IV lasix for now and follow serial sodium levels 3. Recheck urine studies in am. 2. Hypokalemia- improved with repletion 3. Hypomagnesemia- likely due to poor oral intake and malnutrition- improved  with repletion 4. Acute on chronic diastolic CHF- responding to IV lasix 5. E. Coli UTI- on ceftriaxone. 6. H/o seizure disorder- on dilantin 7. Etoh abuse- abstinent for past several weeks while resident at [facility] 8. HTN- stable  Irena Cords, MD [employer] [phone]

## 2020-04-03 NOTE — Progress Notes (Signed)
Received bed change to telemetry and was assigned bed. Called report to [facility] on 300. Had to get bed from 2A and rolled patient to room 304.

## 2020-04-04 LAB — CBC WITH DIFFERENTIAL/PLATELET
Abs Immature Granulocytes: 0.02 10*3/uL (ref 0.00–0.07)
Basophils Absolute: 0.1 10*3/uL (ref 0.0–0.1)
Basophils Relative: 1 %
Eosinophils Absolute: 0.1 10*3/uL (ref 0.0–0.5)
Eosinophils Relative: 3 %
HCT: 26.6 % — ABNORMAL LOW (ref 39.0–52.0)
Hemoglobin: 8.6 g/dL — ABNORMAL LOW (ref 13.0–17.0)
Immature Granulocytes: 1 %
Lymphocytes Relative: 29 %
Lymphs Abs: 1.2 10*3/uL (ref 0.7–4.0)
MCH: 27.3 pg (ref 26.0–34.0)
MCHC: 32.3 g/dL (ref 30.0–36.0)
MCV: 84.4 fL (ref 80.0–100.0)
Monocytes Absolute: 0.5 10*3/uL (ref 0.1–1.0)
Monocytes Relative: 13 %
Neutro Abs: 2.1 10*3/uL (ref 1.7–7.7)
Neutrophils Relative %: 53 %
Platelets: 271 10*3/uL (ref 150–400)
RBC: 3.15 MIL/uL — ABNORMAL LOW (ref 4.22–5.81)
RDW: 14.5 % (ref 11.5–15.5)
WBC: 4 10*3/uL (ref 4.0–10.5)
nRBC: 0 % (ref 0.0–0.2)

## 2020-04-04 LAB — RENAL FUNCTION PANEL
Albumin: 2.5 g/dL — ABNORMAL LOW (ref 3.5–5.0)
Anion gap: 10 (ref 5–15)
BUN: 8 mg/dL (ref 8–23)
CO2: 28 mmol/L (ref 22–32)
Calcium: 8.6 mg/dL — ABNORMAL LOW (ref 8.9–10.3)
Chloride: 84 mmol/L — ABNORMAL LOW (ref 98–111)
Creatinine, Ser: 0.68 mg/dL (ref 0.61–1.24)
GFR calc Af Amer: >60 mL/min (ref >60)
GFR calc non Af Amer: >60 mL/min (ref >60)
Glucose, Bld: 111 mg/dL — ABNORMAL HIGH (ref 70–99)
Phosphorus: 4 mg/dL (ref 2.5–4.6)
Potassium: 3.7 mmol/L (ref 3.5–5.1)
Sodium: 122 mmol/L — ABNORMAL LOW (ref 135–145)

## 2020-04-04 LAB — GLUCOSE, CAPILLARY
Glucose-Capillary: 116 mg/dL — ABNORMAL HIGH (ref 70–99)
Glucose-Capillary: 114 mg/dL — ABNORMAL HIGH (ref 70–99)
Glucose-Capillary: 96 mg/dL (ref 70–99)
Glucose-Capillary: 121 mg/dL — ABNORMAL HIGH (ref 70–99)
Glucose-Capillary: 130 mg/dL — ABNORMAL HIGH (ref 70–99)
Glucose-Capillary: 152 mg/dL — ABNORMAL HIGH (ref 70–99)

## 2020-04-04 LAB — MAGNESIUM: Magnesium: 1.6 mg/dL — ABNORMAL LOW (ref 1.7–2.4)

## 2020-04-04 LAB — SODIUM: Sodium: 120 mmol/L — ABNORMAL LOW (ref 135–145)

## 2020-04-04 LAB — OSMOLALITY: Osmolality: 251 mOsm/kg — ABNORMAL LOW (ref 275–295)

## 2020-04-04 MED ORDER — MAGNESIUM SULFATE 4 GM/100ML IV SOLN
4.0000 g | Freq: Once | INTRAVENOUS | Status: AC
  Administered 2020-04-04: 4 g via INTRAVENOUS
  Filled 2020-04-04: qty 100

## 2020-04-04 MED ORDER — ENSURE MAX PROTEIN PO LIQD
11.0000 [oz_av] | Freq: Two times a day (BID) | ORAL | Status: DC
  Administered 2020-04-04 – 2020-04-15 (×22): 11 [oz_av] via ORAL
  Filled 2020-04-04 (×13): qty 330

## 2020-04-04 MED ORDER — PHENYTOIN SODIUM EXTENDED 100 MG PO CAPS
200.0000 mg | ORAL_CAPSULE | Freq: Every day | ORAL | Status: DC
  Administered 2020-04-04 – 2020-04-15 (×12): 200 mg via ORAL
  Filled 2020-04-04 (×13): qty 2

## 2020-04-04 MED ORDER — PANTOPRAZOLE SODIUM 40 MG PO TBEC
40.0000 mg | DELAYED_RELEASE_TABLET | Freq: Every day | ORAL | Status: DC
  Administered 2020-04-04 – 2020-04-15 (×12): 40 mg via ORAL
  Filled 2020-04-04 (×12): qty 1

## 2020-04-04 MED ORDER — PHENYTOIN SODIUM EXTENDED 100 MG PO CAPS
300.0000 mg | ORAL_CAPSULE | Freq: Every day | ORAL | Status: DC
  Administered 2020-04-04 – 2020-04-14 (×11): 300 mg via ORAL
  Filled 2020-04-04 (×13): qty 3

## 2020-04-04 MED ORDER — SENNOSIDES-DOCUSATE SODIUM 8.6-50 MG PO TABS
2.0000 | ORAL_TABLET | Freq: Every day | ORAL | Status: DC
  Administered 2020-04-04 – 2020-04-14 (×10): 2 via ORAL
  Filled 2020-04-04 (×10): qty 2

## 2020-04-04 NOTE — TOC Progression Note (Signed)
Transition of Care [facility]) - Progression Note    Patient Details  Name: Brad Graham MRN: [id number] Date of Birth: 1960/10/14  Transition of Care [facility]) CM/SW Contact  Karn Cassis, [place] Phone Number: 04/04/2020, 9:04 AM  Clinical Narrative:   LCSW updated Melissa at [facility] on pt and asked if pt would need re-authorization for Medicaid to return. Melissa states pt will need authorization and she will start this morning. Will follow.     Expected Discharge Plan: Long Term Nursing Home Barriers to Discharge: Continued Medical Work up  Expected Discharge Plan and Services Expected Discharge Plan: Long Term Nursing Home In-house Referral: Clinical Social Work   Post Acute Care Choice: Resumption of Svcs/PTA Provider Living arrangements for the past 2 months: Skilled Nursing Facility                                       Social Determinants of Health (SDOH) Interventions    Readmission Risk Interventions Readmission Risk Prevention Plan 03/31/2020  Transportation Screening Complete  Medication Review (RN CM) Complete  Some recent data might be hidden

## 2020-04-04 NOTE — Progress Notes (Addendum)
PROGRESS NOTE   Brad Graham  GUR:[id number] DOB: 1960-10-22 DOA: 03/30/2020 PCP: [name], NP   Chief Complaint  Patient presents with  . Altered Mental Status    Brief Admission History:  59 y.o. male with medical history significant for a recent hospitalization at [facility] hospital for cardiac arrest requiring intubation acute renal failure aspiration pneumonia who was treated in the ICU for several days and subsequently extubated and eventually discharged to [facility].  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  He has a history of chronic alcohol abuse and he went through alcohol withdrawal during last hospitalization.  He has a chronic seizure disorder and is maintained on Dilantin.  He has type 2 diabetes mellitus and hypertension.  These are treated with oral medications.  He was admitted with acute mental status changes and severe hyponatremia with sodium of 109.     Assessment & Plan:   Principal Problem:   Hyponatremia Active Problems:   Essential hypertension   Diabetes mellitus without complication (HCC)   Hyperlipidemia   Proteinuria   History of Cardiopulmonary arrest    Acute encephalopathy   Hyperglycemia   Volume overload   Alcohol abuse   Centrencephalic epilepsy (HCC)   Hemiparesis affecting left side as late effect of cerebrovascular accident   Bilateral leg edema   Sinus tachycardia   UTI (urinary tract infection)   DNR (do not resuscitate)  1. Severe hyponatremia - Slowly Improving with treatment.  His sodium remains at 122 and his volume overload is much improved as he has been diuresing nicely now about 17 L since admission.  Pt presented with acute on chronic hyponatremia, with a marked drop in sodium from recent testing.  Sodium was 130 about 2 weeks ago and was down to a critically low level of 109 and he was symptomatic.  He was initially on hypertonic saline but now discontinued.    Nephrology  is following.  His mentation is improved.  Transferred to telemetry.    2. DNR - present on admission.  3. Metabolic encephalopathy - improved.   4. Volume overload - improving but not resolved, IV lasix per nephrology team.   He has diuresed over 16L since admission.  5. Epilepsy - resumed dilantin. No seizure activity seen.  6. Type 2 Diabetes mellitus with vascular complications - SSI coverage and frequent CBG testing.    7. Alcohol abuse - He has been dry for the past several weeks while a resident at [facility] and presented with negative alcohol levels.  Resumed oral vitamin supplements.  8. Hypomagnesemia - severe - continue IV replacement and follow.  9. Sinus tachycardia - resolved now.  His carvedilol has been restarted.   10. Essential hypertension - resuming home BP meds.    11. E coli UTI - treated with IV ceftriaxone.  12. Anemia of chronic disease - we have been monitoring CBC and Hg has been stable.    DVT prophylaxis: lovenox   Code Status: DNR   Family Communication:  sister [name] updated telephon 7/8, I attempted to call all contacts listed 7/10 no answer Disposition Plan: [employer] called: nephrology   Admission status: INP   Status is: Inpatient  Remains inpatient appropriate because:Persistent severe electrolyte disturbances, IV treatments appropriate due to intensity of illness or inability to take PO and Inpatient level of care appropriate due to severity of illness.  Sodium level remains critically low and is slowly  being corrected.  It is not yet at a medically acceptable level.    Dispo: The patient is from: SNF              Anticipated d/c is to: SNF              Anticipated d/c date is: 2 days              Patient currently is not medically stable to d/c.  Sodium remains very low and working to correct slowly.   Consultants:   Nephrology   Procedures:   Antimicrobials:  Ceftriaxone 7/7>>7/11   Subjective: Pt has no specific  complaints today.    Objective: Vitals:   04/03/20 2221 04/04/20 0220 04/04/20 0358 04/04/20 0522  BP: 129/79 132/81  132/89  Pulse: 94 90  90  Resp: 18 18  16   Temp: 99 F (37.2 C) 98.9 F (37.2 C)  97.8 F (36.6 C)  TempSrc: Oral Oral  Oral  SpO2: 100% 98%  99%  Weight:   110.9 kg   Height:        Intake/Output Summary (Last 24 hours) at 04/04/2020 0944 Last data filed at 04/04/2020 06/05/2020 Gross per 24 hour  Intake 700 ml  Output 1150 ml  Net -450 ml   Filed Weights   04/02/20 0510 04/03/20 0500 04/04/20 0358  Weight: 110.6 kg 107.1 kg 110.9 kg   Examination:  General exam: awake, alert, Appears calm and comfortable, speaking in normal sentences  Respiratory system: less crackles heard today. Respiratory effort normal. Cardiovascular system:  normal S1 & S2 heard. No JVD, murmurs, rubs, gallops or clicks. Trace pedal edema. Gastrointestinal system: Abdomen is nondistended, soft and nontender. No organomegaly or masses felt. Normal bowel sounds heard. Central nervous system: Alert and oriented. No focal neurological deficits. Extremities: Symmetric 5 x 5 power.  TED hoses BLEs.  Skin: trace edema BLEs, No rashes, lesions or ulcers, diffuse onychomycosis.  Psychiatry: Judgement and insight appear normal. Mood & affect appropriate.   Data Reviewed: I have personally reviewed following labs and imaging studies  CBC: Recent Labs  Lab 03/31/20 0341 04/01/20 0458 04/02/20 0436 04/03/20 0530 04/04/20 0550  WBC 6.9 4.4 3.1* 3.9* 4.0  NEUTROABS 5.0 2.9 1.5* 1.8 2.1  HGB 8.9* 8.4* 8.6* 8.3* 8.6*  HCT 26.8* 26.2* 26.9* 25.5* 26.6*  MCV 82.7 83.7 85.4 84.7 84.4  PLT 269 246 286 282 271   Basic Metabolic Panel: Recent Labs  Lab 03/31/20 0341 03/31/20 0641 04/01/20 0458 04/01/20 1357 04/02/20 0436 04/03/20 0530 04/04/20 0550  NA 118*   < > 121* 122* 126* 122* 122*  K 3.1*   < > 3.5 4.1 3.6 3.6 3.7  CL 81*   < > 85* 87* 87* 83* 84*  CO2 21*   < > 26 25 28 30 28     GLUCOSE 74   < > 162* 172* 101* 110* 111*  BUN 5*   < > <5* 5* <5* 6* 8  CREATININE 0.77   < > 0.70 0.77 0.74 0.70 0.68  CALCIUM 8.2*   < > 8.2* 8.1* 8.6* 8.4* 8.6*  MG 1.0*  --  1.4*  --  1.6* 1.6* 1.6*  PHOS  --   --   --   --   --   --  4.0   < > = values in this interval not displayed.   GFR: Estimated Creatinine Clearance: 124.7 mL/min (by C-G formula based on SCr of 0.68 mg/dL).  Liver  Function Tests: Recent Labs  Lab 03/30/20 0949 04/04/20 0550  AST 17  --   ALT 13  --   ALKPHOS 172*  --   BILITOT 0.8  --   PROT 6.9  --   ALBUMIN 2.7* 2.5*   CBG: Recent Labs  Lab 04/03/20 1653 04/03/20 2121 04/04/20 0257 04/04/20 0617 04/04/20 0729  GLUCAP 132* 145* 116* 114* 96    Recent Results (from the past 240 hour(s))  SARS Coronavirus 2 by RT PCR (hospital order, performed in [facility] hospital lab) Nasopharyngeal Nasopharyngeal Swab     Status: None   Collection Time: 03/30/20 12:04 PM   Specimen: Nasopharyngeal Swab  Result Value Ref Range Status   SARS Coronavirus 2 NEGATIVE NEGATIVE Final    Comment: (NOTE) SARS-CoV-2 target nucleic acids are NOT DETECTED.  The SARS-CoV-2 RNA is generally detectable in upper and lower respiratory specimens during the acute phase of infection. The lowest concentration of SARS-CoV-2 viral copies this assay can detect is 250 copies / mL. A negative result does not preclude SARS-CoV-2 infection and should not be used as the sole basis for treatment or other patient management decisions.  A negative result may occur with improper specimen collection / handling, submission of specimen other than nasopharyngeal swab, presence of viral mutation(s) within the areas targeted by this assay, and inadequate number of viral copies (<250 copies / mL). A negative result must be combined with clinical observations, patient history, and epidemiological information.  Fact Sheet for Patients:    BoilerBrush.com.cyhttps://www.fda.gov/media/136312/download  Fact Sheet for Healthcare Providers: https://pope.com/https://www.fda.gov/media/136313/download  This test is not yet approved or  cleared by the [place] FDA and has been authorized for detection and/or diagnosis of SARS-CoV-2 by FDA under an Emergency Use Authorization (EUA).  This EUA will remain in effect (meaning this test can be used) for the duration of the COVID-19 declaration under Section 564(b)(1) of the Act, 21 U.S.C. section 360bbb-3(b)(1), unless the authorization is terminated or revoked sooner.  Performed at [facility], [address] [place], [place] [address]   Culture, Urine     Status: Abnormal   Collection Time: 03/30/20 12:11 PM   Specimen: Urine, Random  Result Value Ref Range Status   Specimen Description   Final    URINE, RANDOM Performed at [facility], [address] [place], [place] [address]    Special Requests   Final    NONE Performed at [facility], [address] [place], [place] [address]    Culture >=100,000 COLONIES/mL ESCHERICHIA COLI (A)  Final   Report Status 04/01/2020 FINAL  Final   Organism ID, Bacteria ESCHERICHIA COLI (A)  Final      Susceptibility   Escherichia coli - MIC*    AMPICILLIN 4 SENSITIVE Sensitive     CEFAZOLIN <=4 SENSITIVE Sensitive     CEFTRIAXONE <=0.25 SENSITIVE Sensitive     CIPROFLOXACIN <=0.25 SENSITIVE Sensitive     GENTAMICIN <=1 SENSITIVE Sensitive     IMIPENEM <=0.25 SENSITIVE Sensitive     NITROFURANTOIN 32 SENSITIVE Sensitive     TRIMETH/SULFA <=20 SENSITIVE Sensitive     AMPICILLIN/SULBACTAM <=2 SENSITIVE Sensitive     PIP/TAZO <=4 SENSITIVE Sensitive     * >=100,000 COLONIES/mL ESCHERICHIA COLI  MRSA PCR Screening     Status: None   Collection Time: 03/30/20 12:35 PM   Specimen: Nasal Mucosa; Nasopharyngeal  Result Value Ref Range Status   MRSA by PCR NEGATIVE NEGATIVE Final    Comment:  The GeneXpert MRSA Assay (FDA approved for NASAL specimens only),  is one component of a comprehensive MRSA colonization surveillance program. It is not intended to diagnose MRSA infection nor to guide or monitor treatment for MRSA infections. Performed at [facility], [address] [place], [place] [address]   Culture, blood (Routine X 2) w Reflex to ID Panel     Status: None (Preliminary result)   Collection Time: 03/31/20  5:20 PM   Specimen: BLOOD  Result Value Ref Range Status   Specimen Description BLOOD LEFT ANTECUBITAL  Final   Special Requests   Final    BOTTLES DRAWN AEROBIC AND ANAEROBIC Blood Culture adequate volume   Culture   Final    NO GROWTH 4 DAYS Performed at [facility], [address], [place], [place] [address]    Report Status PENDING  Incomplete  Culture, blood (Routine X 2) w Reflex to ID Panel     Status: None (Preliminary result)   Collection Time: 03/31/20  5:28 PM   Specimen: BLOOD  Result Value Ref Range Status   Specimen Description BLOOD BLOOD RIGHT WRIST  Final   Special Requests   Final    AEROBIC BOTTLE ONLY Blood Culture results may not be optimal due to an inadequate volume of blood received in culture bottles   Culture   Final    NO GROWTH 4 DAYS Performed at [facility], [address], [place], [place] [address]    Report Status PENDING  Incomplete     Radiology Studies: No results found. Scheduled Meds: . aspirin EC  81 mg Oral Daily  . atorvastatin  20 mg Oral q1800  . carvedilol  25 mg Oral BID WC  . Chlorhexidine Gluconate Cloth  6 each Topical Daily  . cholecalciferol  2,000 Units Oral q morning - 10a  . enoxaparin (LOVENOX) injection  55 mg Subcutaneous Q24H  . folic acid  1 mg Oral Daily  . insulin aspart  0-9 Units Subcutaneous TID WC  . magnesium oxide  800 mg Oral BID  . pantoprazole (PROTONIX) IV  40 mg Intravenous Q24H  . Ensure Max Protein  11 oz Oral BID  . senna-docusate  1 tablet Oral QHS  . sodium chloride flush  3 mL Intravenous Q12H  . tamsulosin  0.4 mg Oral QPC supper   . thiamine  100 mg Oral Daily   Continuous Infusions: . magnesium sulfate bolus IVPB    . phenytoin (DILANTIN) IV 250 mg (04/03/20 2229)    LOS: 5 days   Standley Dakins, MD How to contact the [facility] Attending or Consulting provider 7A - 7P or covering provider during after hours 7P -7A, for this patient?  1. Check the care team in [facility] and look for a) attending/consulting TRH provider listed and b) the [facility] team listed 2. Log into [email or web address] and use [place]'s universal password to access. If you do not have the password, please contact the hospital operator. 3. Locate the [facility] provider you are looking for under [facility] and page to a number that you can be directly reached. 4. If you still have difficulty reaching the provider, please page the [facility] (Director on Call) for the Hospitalists listed on amion for assistance.  04/04/2020, 9:44 AM

## 2020-04-04 NOTE — Progress Notes (Signed)
Pharmacy Brief Medication Note: Dilantin route change  Discontinue Dilantin  250mg  IVPB q12h  Re-Start:  Dilantin 200mg  po daily with breakfast and Dilantin 300mg  daily at bedtime  Dilantin  250mg  IVPB q12h  Labs:  Goal level (total): 10-36mcg/mL  Albumin: 2.7g/dL on  Phenytoin level (total): 12.9mcg/mL on 03/30/20  CrClest~ 125  mL/min  Monitor patient for:  acute toxicity (eg, confusion, delirium, psychosis, encephalopathy  Check phenytoin levels:  if symptoms of toxicity or breakthrough seizures occur   Will sign off for now.  Thank you for allowing pharmacy to participate in this patient's care.  30m, BS Pharm D, BCPS Clinical Pharmacist Pager [phone]  04/04/2020 10:20 AM

## 2020-04-04 NOTE — Progress Notes (Signed)
Patient ID: Brad Graham, male   DOB: 05-20-1961, 59 y.o.   MRN: [id number] S: Out of ICU, groggy this AM.  AM labs pending.  0.9L UOP, Not on diuretics currently.  O:BP 132/89 (BP Location: Left Arm)   Pulse 90   Temp 97.8 F (36.6 C) (Oral)   Resp 16   Ht 6' (1.829 m)   Wt 110.9 kg   SpO2 99%   BMI 33.16 kg/m   Intake/Output Summary (Last 24 hours) at 04/04/2020 0355 Last data filed at 04/04/2020 0300 Gross per 24 hour  Intake 700 ml  Output 900 ml  Net -200 ml   Intake/Output: I/O last 3 completed shifts: In: 2461.4 [P.O.:2120; I.V.:3; IV Piggyback:338.4] Out: 4800 [Urine:4800]  Intake/Output this shift:  Total I/O In: -  Out: 900 [Urine:900] Weight change: 3.8 kg Gen: NAD, sleepy CVS: no rub Resp: cta Abd: benign Ext: no edema  Recent Labs  Lab 03/30/20 0949 03/30/20 1346 03/31/20 0940 03/31/20 0940 03/31/20 1429 03/31/20 1529 03/31/20 2215 04/01/20 0458 04/01/20 1357 04/02/20 0436 04/03/20 0530  NA 109*   < > 118*   < > 136 120* 124*  123* 121* 122* 126* 122*  K 3.5   < > 3.4*  --   --  3.2* 3.5 3.5 4.1 3.6 3.6  CL 74*   < > 83*  --   --  85* 86* 85* 87* 87* 83*  CO2 22   < > 21*  --   --  24 24 26 25 28 30   GLUCOSE 90   < > 74  --   --  100* 95 162* 172* 101* 110*  BUN 5*   < > 5*  --   --  5* 5* <5* 5* <5* 6*  CREATININE 0.74   < > 0.80  --   --  0.78 0.74 0.70 0.77 0.74 0.70  ALBUMIN 2.7*  --   --   --   --   --   --   --   --   --   --   CALCIUM 8.3*   < > 8.1*  --   --  8.2* 8.5* 8.2* 8.1* 8.6* 8.4*  AST 17  --   --   --   --   --   --   --   --   --   --   ALT 13  --   --   --   --   --   --   --   --   --   --    < > = values in this interval not displayed.   Liver Function Tests: Recent Labs  Lab 03/30/20 0949  AST 17  ALT 13  ALKPHOS 172*  BILITOT 0.8  PROT 6.9  ALBUMIN 2.7*   No results for input(s): LIPASE, AMYLASE in the last 168 hours. No results for input(s): AMMONIA in the last 168 hours. CBC: Recent Labs  Lab  03/30/20 0949 03/30/20 0949 03/31/20 0341 03/31/20 0341 04/01/20 0458 04/02/20 0436 04/03/20 0530  WBC 5.3   < > 6.9   < > 4.4 3.1* 3.9*  NEUTROABS  --   --  5.0   < > 2.9 1.5* 1.8  HGB 9.8*   < > 8.9*   < > 8.4* 8.6* 8.3*  HCT 28.5*   < > 26.8*   < > 26.2* 26.9* 25.5*  MCV 81.0  --  82.7  --  83.7 85.4 84.7  PLT 244   < > 269   < > 246 286 282   < > = values in this interval not displayed.   Cardiac Enzymes: No results for input(s): CKTOTAL, CKMB, CKMBINDEX, TROPONINI in the last 168 hours. CBG: Recent Labs  Lab 04/03/20 0620 04/03/20 1205 04/03/20 1653 04/03/20 2121 04/04/20 0257  GLUCAP 108* 114* 132* 145* 116*    Iron Studies: No results for input(s): IRON, TIBC, TRANSFERRIN, FERRITIN in the last 72 hours. Studies/Results: No results found. Marland Kitchen aspirin EC  81 mg Oral Daily  . atorvastatin  20 mg Oral q1800  . carvedilol  25 mg Oral BID WC  . Chlorhexidine Gluconate Cloth  6 each Topical Daily  . cholecalciferol  2,000 Units Oral q morning - 10a  . enoxaparin (LOVENOX) injection  55 mg Subcutaneous Q24H  . folic acid  1 mg Oral Daily  . insulin aspart  0-9 Units Subcutaneous TID WC  . magnesium oxide  800 mg Oral BID  . pantoprazole (PROTONIX) IV  40 mg Intravenous Q24H  . senna-docusate  1 tablet Oral QHS  . sodium chloride flush  3 mL Intravenous Q12H  . tamsulosin  0.4 mg Oral QPC supper  . thiamine  100 mg Oral Daily    BMET    Component Value Date/Time   NA 122 (L) 04/03/2020 0530   NA 137 02/16/2020 1426   NA 126 (L) 05/02/2014 0921   K 3.6 04/03/2020 0530   K 3.6 05/02/2014 0921   CL 83 (L) 04/03/2020 0530   CL 93 (L) 05/02/2014 0921   CO2 30 04/03/2020 0530   CO2 21 05/02/2014 0921   GLUCOSE 110 (H) 04/03/2020 0530   GLUCOSE 106 (H) 05/02/2014 0921   BUN 6 (L) 04/03/2020 0530   BUN 28 (H) 02/16/2020 1426   BUN 19 (H) 05/02/2014 0921   CREATININE 0.70 04/03/2020 0530   CREATININE 1.22 05/02/2014 0921   CALCIUM 8.4 (L) 04/03/2020 0530    CALCIUM 8.5 05/02/2014 0921   GFRNONAA >60 04/03/2020 0530   GFRNONAA >60 05/02/2014 0921   GFRAA >60 04/03/2020 0530   GFRAA >60 05/02/2014 0921   CBC    Component Value Date/Time   WBC 3.9 (L) 04/03/2020 0530   RBC 3.01 (L) 04/03/2020 0530   HGB 8.3 (L) 04/03/2020 0530   HGB 11.7 (L) 02/16/2020 1426   HCT 25.5 (L) 04/03/2020 0530   HCT 35.0 (L) 02/16/2020 1426   PLT 282 04/03/2020 0530   PLT 192 02/16/2020 1426   MCV 84.7 04/03/2020 0530   MCV 88 02/16/2020 1426   MCV 94 05/02/2014 0134   MCH 27.6 04/03/2020 0530   MCHC 32.5 04/03/2020 0530   RDW 14.2 04/03/2020 0530   RDW 13.3 02/16/2020 1426   RDW 13.8 05/02/2014 0134   LYMPHSABS 1.3 04/03/2020 0530   LYMPHSABS 1.0 02/16/2020 1426   MONOABS 0.6 04/03/2020 0530   EOSABS 0.1 04/03/2020 0530   EOSABS 0.0 02/16/2020 1426   BASOSABS 0.1 04/03/2020 0530   BASOSABS 0.1 02/16/2020 1426     Assessment/Plan:  1. Hypotonic Hypervolemic Hyponatremia, severe- due to acute on chronic diastolic CHF with volume overload.  Initially treated with 3% saline for AMS with improvement and now responding to IV lasix.  Vol status much improved. Now suspect needs more solute and fluid restriction. Add 1.2L total daily fluids, BID protein supplements.  Trend SNa BID.  Can he have solid foods? 2. Hypokalemia- improved 3. Hypomagnesemia- improved 4. Acute on chronic diastolic  CHF- acute component resolved 5. E. Coli UTI- on ceftriaxone. 6. H/o seizure disorder- on dilantin 7. Etoh abuse- abstinent for past several weeks while resident at [facility] 8. HTN- stable  Arita Miss

## 2020-04-05 LAB — CULTURE, BLOOD (ROUTINE X 2)
Culture: NO GROWTH
Culture: NO GROWTH
Special Requests: ADEQUATE

## 2020-04-05 LAB — GLUCOSE, CAPILLARY
Glucose-Capillary: 111 mg/dL — ABNORMAL HIGH (ref 70–99)
Glucose-Capillary: 124 mg/dL — ABNORMAL HIGH (ref 70–99)
Glucose-Capillary: 167 mg/dL — ABNORMAL HIGH (ref 70–99)
Glucose-Capillary: 117 mg/dL — ABNORMAL HIGH (ref 70–99)

## 2020-04-05 LAB — URINALYSIS, COMPLETE (UACMP) WITH MICROSCOPIC
Bilirubin Urine: NEGATIVE
Glucose, UA: NEGATIVE mg/dL
Hgb urine dipstick: NEGATIVE
Ketones, ur: NEGATIVE mg/dL
Leukocytes,Ua: NEGATIVE
Nitrite: NEGATIVE
Protein, ur: NEGATIVE mg/dL
Specific Gravity, Urine: 1.009 (ref 1.005–1.030)
pH: 8 (ref 5.0–8.0)

## 2020-04-05 LAB — RENAL FUNCTION PANEL
Albumin: 2.5 g/dL — ABNORMAL LOW (ref 3.5–5.0)
Anion gap: 11 (ref 5–15)
BUN: 9 mg/dL (ref 8–23)
CO2: 26 mmol/L (ref 22–32)
Calcium: 8.6 mg/dL — ABNORMAL LOW (ref 8.9–10.3)
Chloride: 83 mmol/L — ABNORMAL LOW (ref 98–111)
Creatinine, Ser: 0.61 mg/dL (ref 0.61–1.24)
GFR calc Af Amer: >60 mL/min (ref >60)
GFR calc non Af Amer: >60 mL/min (ref >60)
Glucose, Bld: 110 mg/dL — ABNORMAL HIGH (ref 70–99)
Phosphorus: 4.3 mg/dL (ref 2.5–4.6)
Potassium: 3.7 mmol/L (ref 3.5–5.1)
Sodium: 120 mmol/L — ABNORMAL LOW (ref 135–145)

## 2020-04-05 LAB — OSMOLALITY: Osmolality: 256 mOsm/kg — ABNORMAL LOW (ref 275–295)

## 2020-04-05 LAB — MAGNESIUM: Magnesium: 1.7 mg/dL (ref 1.7–2.4)

## 2020-04-05 LAB — SODIUM, URINE, RANDOM: Sodium, Ur: 38 mmol/L

## 2020-04-05 LAB — OSMOLALITY, URINE: Osmolality, Ur: 280 mOsm/kg — ABNORMAL LOW (ref 300–900)

## 2020-04-05 NOTE — Progress Notes (Signed)
PROGRESS NOTE   Brad Graham  PQD:[id number] DOB: [birth year] DOA: 03/30/2020 PCP: [name], NP   Chief Complaint  Patient presents with  . Altered Mental Status    Brief Admission History:  59 y.o. male with medical history significant for a recent hospitalization at [facility] for cardiac arrest requiring intubation acute renal failure aspiration pneumonia who was treated in the ICU for several days and subsequently extubated and eventually discharged to [facility]  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  He has a history of chronic alcohol abuse and he went through alcohol withdrawal during last hospitalization.  He has a chronic seizure disorder and is maintained on Dilantin.  He has type 2 diabetes mellitus and hypertension.  These are treated with oral medications.  He was admitted with acute mental status changes and severe hyponatremia with sodium of 109.     Assessment & Plan:   Principal Problem:   Hyponatremia Active Problems:   Essential hypertension   Diabetes mellitus without complication (HCC)   Hyperlipidemia   Proteinuria   History of Cardiopulmonary arrest    Acute encephalopathy   Hyperglycemia   Volume overload   Alcohol abuse   Centrencephalic epilepsy (HCC)   Hemiparesis affecting left side as late effect of cerebrovascular accident   Bilateral leg edema   Sinus tachycardia   UTI (urinary tract infection)   DNR (do not resuscitate)  1. Severe hyponatremia - Slowly Improving with treatment.  His sodium remains at 122 and his volume overload is much improved as he has been diuresing nicely now about 18 L since admission.  Pt presented with acute on chronic hyponatremia, with a marked drop in sodium from recent testing.  Sodium was 130 about 2 weeks ago and was down to a critically low level of 109 and he was symptomatic.  He was initially on hypertonic saline but now discontinued.    Nephrology  is following.  His mentation is improved.  Transferred to telemetry.    2. DNR - present on admission.  3. Metabolic encephalopathy - improved.   4. Volume overload - improving, s/p IV lasix per nephrology team.   He has diuresed over 18L since admission.  Lasix stopped. 5. Epilepsy - stable on dilantin. No seizure activity seen.  6. Type 2 Diabetes mellitus with vascular complications - SSI coverage and frequent CBG testing.  Stable.   7. Alcohol abuse - He has been abstinent for the past several weeks while a resident at [facility] and presented with negative alcohol levels.  Resumed oral vitamin supplements.  8. Hypomagnesemia - severe - required multiple doses of IV and oral replacement, finally got Mg up to 1.7 today.  9. Sinus tachycardia - resolved now.  His carvedilol has been restarted.   10. Essential hypertension - resuming home BP meds.    11. E coli UTI - fully treated with IV ceftriaxone.  12. Anemia of chronic disease - we have been monitoring CBC and Hg has been stable.    DVT prophylaxis: lovenox   Code Status: DNR   Family Communication:  sister Brad Graham updated telephon 7/8, I attempted to call all contacts listed 7/10 no answer Disposition Plan: [employer] called: nephrology   Admission status: INP   Status is: Inpatient  Remains inpatient appropriate because:Persistent severe electrolyte disturbances, IV treatments appropriate due to intensity of illness or inability to take PO and Inpatient level of care appropriate due  to severity of illness.  Sodium level remains critically low and is slowly being corrected.  It is not yet at a medically acceptable level.    Dispo: The patient is from: SNF              Anticipated d/c is to: SNF              Anticipated d/c date is: 2-3 days              Patient currently is not medically stable to d/c.  Sodium remains very low and working to correct slowly.   Consultants:   Nephrology   Procedures:    Antimicrobials:  Ceftriaxone 7/7>>7/11   Subjective: Pt has no specific complaints.  His mentation continues to improve.    Objective: Vitals:   04/04/20 2113 04/05/20 0558 04/05/20 0800 04/05/20 1424  BP: 116/75 139/90 (!) 146/95 132/76  Pulse: 66 90 87 87  Resp: 16 16 16 18   Temp: 99.1 F (37.3 C) 98.7 F (37.1 C)  (!) 97.5 F (36.4 C)  TempSrc: Oral Oral  Oral  SpO2: 95% 95% 100% 100%  Weight:      Height:        Intake/Output Summary (Last 24 hours) at 04/05/2020 1612 Last data filed at 04/05/2020 1300 Gross per 24 hour  Intake 880 ml  Output 2100 ml  Net -1220 ml   Filed Weights   04/02/20 0510 04/03/20 0500 04/04/20 0358  Weight: 110.6 kg 107.1 kg 110.9 kg   Examination:  General exam: awake, alert, Appears calm and comfortable, speaking in normal sentences  Respiratory system: less crackles heard today. Respiratory effort normal. Cardiovascular system:  normal S1 & S2 heard. No JVD, murmurs, rubs, gallops or clicks. Trace pedal edema. Gastrointestinal system: Abdomen is nondistended, soft and nontender. No organomegaly or masses felt. Normal bowel sounds heard. Central nervous system: Alert and oriented. No focal neurological deficits. Extremities: Symmetric 5 x 5 power.  TED hoses BLEs.  Skin: trace edema BLEs, No rashes, lesions or ulcers, diffuse onychomycosis.  Psychiatry: Judgement and insight appear normal. Mood & affect appropriate.   Data Reviewed: I have personally reviewed following labs and imaging studies  CBC: Recent Labs  Lab 03/31/20 0341 04/01/20 0458 04/02/20 0436 04/03/20 0530 04/04/20 0550  WBC 6.9 4.4 3.1* 3.9* 4.0  NEUTROABS 5.0 2.9 1.5* 1.8 2.1  HGB 8.9* 8.4* 8.6* 8.3* 8.6*  HCT 26.8* 26.2* 26.9* 25.5* 26.6*  MCV 82.7 83.7 85.4 84.7 84.4  PLT 269 246 286 282 271   Basic Metabolic Panel: Recent Labs  Lab 04/01/20 0458 04/01/20 0458 04/01/20 1357 04/01/20 1357 04/02/20 0436 04/03/20 0530 04/04/20 0550 04/04/20 1600  04/05/20 0453  NA 121*   < > 122*   < > 126* 122* 122* 120* 120*  K 3.5   < > 4.1  --  3.6 3.6 3.7  --  3.7  CL 85*   < > 87*  --  87* 83* 84*  --  83*  CO2 26   < > 25  --  28 30 28   --  26  GLUCOSE 162*   < > 172*  --  101* 110* 111*  --  110*  BUN <5*   < > 5*  --  <5* 6* 8  --  9  CREATININE 0.70   < > 0.77  --  0.74 0.70 0.68  --  0.61  CALCIUM 8.2*   < > 8.1*  --  8.6* 8.4*  8.6*  --  8.6*  MG 1.4*  --   --   --  1.6* 1.6* 1.6*  --  1.7  PHOS  --   --   --   --   --   --  4.0  --  4.3   < > = values in this interval not displayed.   GFR: Estimated Creatinine Clearance: 124.7 mL/min (by C-G formula based on SCr of 0.61 mg/dL).  Liver Function Tests: Recent Labs  Lab 03/30/20 0949 04/04/20 0550 04/05/20 0453  AST 17  --   --   ALT 13  --   --   ALKPHOS 172*  --   --   BILITOT 0.8  --   --   PROT 6.9  --   --   ALBUMIN 2.7* 2.5* 2.5*   CBG: Recent Labs  Lab 04/04/20 1629 04/04/20 2111 04/05/20 0304 04/05/20 0722 04/05/20 1607  GLUCAP 130* 152* 111* 124* 167*    Recent Results (from the past 240 hour(s))  SARS Coronavirus 2 by RT PCR (hospital order, performed in [facility] hospital lab) Nasopharyngeal Nasopharyngeal Swab     Status: None   Collection Time: 03/30/20 12:04 PM   Specimen: Nasopharyngeal Swab  Result Value Ref Range Status   SARS Coronavirus 2 NEGATIVE NEGATIVE Final    Comment: (NOTE) SARS-CoV-2 target nucleic acids are NOT DETECTED.  The SARS-CoV-2 RNA is generally detectable in upper and lower respiratory specimens during the acute phase of infection. The lowest concentration of SARS-CoV-2 viral copies this assay can detect is 250 copies / mL. A negative result does not preclude SARS-CoV-2 infection and should not be used as the sole basis for treatment or other patient management decisions.  A negative result may occur with improper specimen collection / handling, submission of specimen other than nasopharyngeal swab, presence of viral  mutation(s) within the areas targeted by this assay, and inadequate number of viral copies (<250 copies / mL). A negative result must be combined with clinical observations, patient history, and epidemiological information.  Fact Sheet for Patients:   BoilerBrush.com.cy  Fact Sheet for Healthcare Providers: https://pope.com/  This test is not yet approved or  cleared by the [place] FDA and has been authorized for detection and/or diagnosis of SARS-CoV-2 by FDA under an Emergency Use Authorization (EUA).  This EUA will remain in effect (meaning this test can be used) for the duration of the COVID-19 declaration under Section 564(b)(1) of the Act, 21 U.S.C. section 360bbb-3(b)(1), unless the authorization is terminated or revoked sooner.  Performed at [facility], [address], [place], [place] [address]   Culture, Urine     Status: Abnormal   Collection Time: 03/30/20 12:11 PM   Specimen: Urine, Random  Result Value Ref Range Status   Specimen Description   Final    URINE, RANDOM Performed at [facility], [address], [place], [place] [address]    Special Requests   Final    NONE Performed at [facility], [address] [place], [place] [address]    Culture >=100,000 COLONIES/mL ESCHERICHIA COLI (A)  Final   Report Status 04/01/2020 FINAL  Final   Organism ID, Bacteria ESCHERICHIA COLI (A)  Final      Susceptibility   Escherichia coli - MIC*    AMPICILLIN 4 SENSITIVE Sensitive     CEFAZOLIN <=4 SENSITIVE Sensitive     CEFTRIAXONE <=0.25 SENSITIVE Sensitive     CIPROFLOXACIN <=0.25 SENSITIVE Sensitive     GENTAMICIN <=  1 SENSITIVE Sensitive     IMIPENEM <=0.25 SENSITIVE Sensitive     NITROFURANTOIN 32 SENSITIVE Sensitive     TRIMETH/SULFA <=20 SENSITIVE Sensitive     AMPICILLIN/SULBACTAM <=2 SENSITIVE Sensitive     PIP/TAZO <=4 SENSITIVE Sensitive     * >=100,000 COLONIES/mL ESCHERICHIA COLI  MRSA PCR Screening      Status: None   Collection Time: 03/30/20 12:35 PM   Specimen: Nasal Mucosa; Nasopharyngeal  Result Value Ref Range Status   MRSA by PCR NEGATIVE NEGATIVE Final    Comment:        The GeneXpert MRSA Assay (FDA approved for NASAL specimens only), is one component of a comprehensive MRSA colonization surveillance program. It is not intended to diagnose MRSA infection nor to guide or monitor treatment for MRSA infections. Performed at [facility], [address], [place], [place] [address]   Culture, blood (Routine X 2) w Reflex to ID Panel     Status: None   Collection Time: 03/31/20  5:20 PM   Specimen: BLOOD  Result Value Ref Range Status   Specimen Description BLOOD LEFT ANTECUBITAL  Final   Special Requests   Final    BOTTLES DRAWN AEROBIC AND ANAEROBIC Blood Culture adequate volume   Culture   Final    NO GROWTH 5 DAYS Performed at [facility], [address], [place], [place] [address]    Report Status 04/05/2020 FINAL  Final  Culture, blood (Routine X 2) w Reflex to ID Panel     Status: None   Collection Time: 03/31/20  5:28 PM   Specimen: BLOOD  Result Value Ref Range Status   Specimen Description BLOOD BLOOD RIGHT WRIST  Final   Special Requests   Final    AEROBIC BOTTLE ONLY Blood Culture results may not be optimal due to an inadequate volume of blood received in culture bottles   Culture   Final    NO GROWTH 5 DAYS Performed at [facility], [address] [place], [place] [address]    Report Status 04/05/2020 FINAL  Final     Radiology Studies: No results found. Scheduled Meds: . aspirin EC  81 mg Oral Daily  . atorvastatin  20 mg Oral q1800  . carvedilol  25 mg Oral BID WC  . Chlorhexidine Gluconate Cloth  6 each Topical Daily  . cholecalciferol  2,000 Units Oral q morning - 10a  . enoxaparin (LOVENOX) injection  55 mg Subcutaneous Q24H  . folic acid  1 mg Oral Daily  . insulin aspart  0-9 Units Subcutaneous TID WC  . magnesium oxide  800 mg Oral BID    . pantoprazole  40 mg Oral Daily  . phenytoin  200 mg Oral Daily  . phenytoin  300 mg Oral QHS  . Ensure Max Protein  11 oz Oral BID  . senna-docusate  2 tablet Oral QHS  . sodium chloride flush  3 mL Intravenous Q12H  . tamsulosin  0.4 mg Oral QPC supper  . thiamine  100 mg Oral Daily   Continuous Infusions:   LOS: 6 days   Standley Dakins, MD How to contact the [facility] Attending or Consulting provider 7A - 7P or covering provider during after hours 7P -7A, for this patient?  1. Check the care team in [facility] and look for a) attending/consulting TRH provider listed and b) the [facility] team listed 2. Log into [email or web address] and use [place]'s universal password to access. If you do not have the password, please  contact the hospital operator. 3. Locate the [facility] provider you are looking for under [facility] and page to a number that you can be directly reached. 4. If you still have difficulty reaching the provider, please page the [facility] (Director on Call) for the Hospitalists listed on amion for assistance.  04/05/2020, 4:12 PM

## 2020-04-05 NOTE — Progress Notes (Signed)
Patient ID: Brad Graham, male   DOB: 07-02-1961, 59 y.o.   MRN: [id number] S: Feels well, no complaints. O:BP (!) 146/95 (BP Location: Left Arm)   Pulse 87   Temp 98.7 F (37.1 C) (Oral)   Resp 16   Ht 6' (1.829 m)   Wt 110.9 kg   SpO2 100%   BMI 33.16 kg/m   Intake/Output Summary (Last 24 hours) at 04/05/2020 0958 Last data filed at 04/05/2020 0900 Gross per 24 hour  Intake 773.16 ml  Output 1900 ml  Net -1126.84 ml   Intake/Output: I/O last 3 completed shifts: In: 773.2 [P.O.:720; IV Piggyback:53.2] Out: 3050 [Urine:3050]  Intake/Output this shift:  Total I/O In: 240 [P.O.:240] Out: -  Weight change:  Gen: NAD CVS: no rub Resp: cta Abd: benign Ext: trace presacral edema, no lower ext edema  Recent Labs  Lab 03/30/20 0949 03/30/20 1346 03/31/20 2215 03/31/20 2215 04/01/20 0458 04/01/20 1357 04/02/20 0436 04/03/20 0530 04/04/20 0550 04/04/20 1600 04/05/20 0453  NA 109*   < > 124*  123*   < > 121* 122* 126* 122* 122* 120* 120*  K 3.5   < > 3.5  --  3.5 4.1 3.6 3.6 3.7  --  3.7  CL 74*   < > 86*  --  85* 87* 87* 83* 84*  --  83*  CO2 22   < > 24  --  26 25 28 30 28   --  26  GLUCOSE 90   < > 95  --  162* 172* 101* 110* 111*  --  110*  BUN 5*   < > 5*  --  <5* 5* <5* 6* 8  --  9  CREATININE 0.74   < > 0.74  --  0.70 0.77 0.74 0.70 0.68  --  0.61  ALBUMIN 2.7*  --   --   --   --   --   --   --  2.5*  --  2.5*  CALCIUM 8.3*   < > 8.5*  --  8.2* 8.1* 8.6* 8.4* 8.6*  --  8.6*  PHOS  --   --   --   --   --   --   --   --  4.0  --  4.3  AST 17  --   --   --   --   --   --   --   --   --   --   ALT 13  --   --   --   --   --   --   --   --   --   --    < > = values in this interval not displayed.   Liver Function Tests: Recent Labs  Lab 03/30/20 0949 04/04/20 0550 04/05/20 0453  AST 17  --   --   ALT 13  --   --   ALKPHOS 172*  --   --   BILITOT 0.8  --   --   PROT 6.9  --   --   ALBUMIN 2.7* 2.5* 2.5*   No results for input(s): LIPASE, AMYLASE in the  last 168 hours. No results for input(s): AMMONIA in the last 168 hours. CBC: Recent Labs  Lab 03/31/20 0341 03/31/20 0341 04/01/20 0458 04/01/20 0458 04/02/20 0436 04/03/20 0530 04/04/20 0550  WBC 6.9   < > 4.4   < > 3.1* 3.9* 4.0  NEUTROABS 5.0   < >  2.9   < > 1.5* 1.8 2.1  HGB 8.9*   < > 8.4*   < > 8.6* 8.3* 8.6*  HCT 26.8*   < > 26.2*   < > 26.9* 25.5* 26.6*  MCV 82.7  --  83.7  --  85.4 84.7 84.4  PLT 269   < > 246   < > 286 282 271   < > = values in this interval not displayed.   Cardiac Enzymes: No results for input(s): CKTOTAL, CKMB, CKMBINDEX, TROPONINI in the last 168 hours. CBG: Recent Labs  Lab 04/04/20 1130 04/04/20 1629 04/04/20 2111 04/05/20 0304 04/05/20 0722  GLUCAP 121* 130* 152* 111* 124*    Iron Studies: No results for input(s): IRON, TIBC, TRANSFERRIN, FERRITIN in the last 72 hours. Studies/Results: No results found. Marland Kitchen aspirin EC  81 mg Oral Daily  . atorvastatin  20 mg Oral q1800  . carvedilol  25 mg Oral BID WC  . Chlorhexidine Gluconate Cloth  6 each Topical Daily  . cholecalciferol  2,000 Units Oral q morning - 10a  . enoxaparin (LOVENOX) injection  55 mg Subcutaneous Q24H  . folic acid  1 mg Oral Daily  . insulin aspart  0-9 Units Subcutaneous TID WC  . magnesium oxide  800 mg Oral BID  . pantoprazole  40 mg Oral Daily  . phenytoin  200 mg Oral Daily  . phenytoin  300 mg Oral QHS  . Ensure Max Protein  11 oz Oral BID  . senna-docusate  2 tablet Oral QHS  . sodium chloride flush  3 mL Intravenous Q12H  . tamsulosin  0.4 mg Oral QPC supper  . thiamine  100 mg Oral Daily    BMET    Component Value Date/Time   NA 120 (L) 04/05/2020 0453   NA 137 02/16/2020 1426   NA 126 (L) 05/02/2014 0921   K 3.7 04/05/2020 0453   K 3.6 05/02/2014 0921   CL 83 (L) 04/05/2020 0453   CL 93 (L) 05/02/2014 0921   CO2 26 04/05/2020 0453   CO2 21 05/02/2014 0921   GLUCOSE 110 (H) 04/05/2020 0453   GLUCOSE 106 (H) 05/02/2014 0921   BUN 9 04/05/2020  0453   BUN 28 (H) 02/16/2020 1426   BUN 19 (H) 05/02/2014 0921   CREATININE 0.61 04/05/2020 0453   CREATININE 1.22 05/02/2014 0921   CALCIUM 8.6 (L) 04/05/2020 0453   CALCIUM 8.5 05/02/2014 0921   GFRNONAA >60 04/05/2020 0453   GFRNONAA >60 05/02/2014 0921   GFRAA >60 04/05/2020 0453   GFRAA >60 05/02/2014 0921   CBC    Component Value Date/Time   WBC 4.0 04/04/2020 0550   RBC 3.15 (L) 04/04/2020 0550   HGB 8.6 (L) 04/04/2020 0550   HGB 11.7 (L) 02/16/2020 1426   HCT 26.6 (L) 04/04/2020 0550   HCT 35.0 (L) 02/16/2020 1426   PLT 271 04/04/2020 0550   PLT 192 02/16/2020 1426   MCV 84.4 04/04/2020 0550   MCV 88 02/16/2020 1426   MCV 94 05/02/2014 0134   MCH 27.3 04/04/2020 0550   MCHC 32.3 04/04/2020 0550   RDW 14.5 04/04/2020 0550   RDW 13.3 02/16/2020 1426   RDW 13.8 05/02/2014 0134   LYMPHSABS 1.2 04/04/2020 0550   LYMPHSABS 1.0 02/16/2020 1426   MONOABS 0.5 04/04/2020 0550   EOSABS 0.1 04/04/2020 0550   EOSABS 0.0 02/16/2020 1426   BASOSABS 0.1 04/04/2020 0550   BASOSABS 0.1 02/16/2020 1426  Assessment/Plan:  1. Hyponatremia, severe- due to acute on chronic diastolic CHF with volume overload. Initially treated with 3% saline for AMS with improvement and now responding to IV lasix. Sodium level improved to 126 04/02/20 but dropped again to 120.   1. Sodium levels stable but have not significantly improved over the past 3 days, likely due to his malnutrition and poor protein intake.  He likely is unable to maximally dilute his urine. 2. Continue with fluid restriction 1200 ml and protein supplements 3. Recheck urine studies today (not done yesterday). 4. May need dose of tolvaptan if not improvement by tomorrow. 2. Hypokalemia- improved with repletion 3. Hypomagnesemia- likely due to poor oral intake and malnutrition- improved with repletion 4. Acute on chronic diastolic CHF- responded to IV lasix and has continued to diurese without lasix (last dose  04/03/20). 5. E. Coli UTI- on ceftriaxone. 6. H/o seizure disorder- on dilantin 7. Etoh abuse- abstinent for past several weeks while resident at [facility] 8. HTN- stable  Irena Cords, MD [employer] [phone]

## 2020-04-06 DIAGNOSIS — I5033 Acute on chronic diastolic (congestive) heart failure: Secondary | ICD-10-CM

## 2020-04-06 HISTORY — DX: Acute on chronic diastolic (congestive) heart failure: I50.33

## 2020-04-06 LAB — GLUCOSE, CAPILLARY
Glucose-Capillary: 121 mg/dL — ABNORMAL HIGH (ref 70–99)
Glucose-Capillary: 113 mg/dL — ABNORMAL HIGH (ref 70–99)
Glucose-Capillary: 123 mg/dL — ABNORMAL HIGH (ref 70–99)
Glucose-Capillary: 122 mg/dL — ABNORMAL HIGH (ref 70–99)
Glucose-Capillary: 169 mg/dL — ABNORMAL HIGH (ref 70–99)

## 2020-04-06 LAB — RENAL FUNCTION PANEL
Albumin: 2.6 g/dL — ABNORMAL LOW (ref 3.5–5.0)
Anion gap: 11 (ref 5–15)
BUN: 12 mg/dL (ref 8–23)
CO2: 25 mmol/L (ref 22–32)
Calcium: 8.7 mg/dL — ABNORMAL LOW (ref 8.9–10.3)
Chloride: 83 mmol/L — ABNORMAL LOW (ref 98–111)
Creatinine, Ser: 0.68 mg/dL (ref 0.61–1.24)
GFR calc Af Amer: >60 mL/min (ref >60)
GFR calc non Af Amer: >60 mL/min (ref >60)
Glucose, Bld: 104 mg/dL — ABNORMAL HIGH (ref 70–99)
Phosphorus: 4.5 mg/dL (ref 2.5–4.6)
Potassium: 3.6 mmol/L (ref 3.5–5.1)
Sodium: 119 mmol/L — CL (ref 135–145)

## 2020-04-06 LAB — MAGNESIUM: Magnesium: 1.5 mg/dL — ABNORMAL LOW (ref 1.7–2.4)

## 2020-04-06 LAB — CREATININE, SERUM
Creatinine, Ser: 0.68 mg/dL (ref 0.61–1.24)
GFR calc Af Amer: >60 mL/min (ref >60)
GFR calc non Af Amer: >60 mL/min (ref >60)

## 2020-04-06 LAB — CBC
HCT: 26 % — ABNORMAL LOW (ref 39.0–52.0)
Hemoglobin: 8.3 g/dL — ABNORMAL LOW (ref 13.0–17.0)
MCH: 26.8 pg (ref 26.0–34.0)
MCHC: 31.9 g/dL (ref 30.0–36.0)
MCV: 83.9 fL (ref 80.0–100.0)
Platelets: 298 10*3/uL (ref 150–400)
RBC: 3.1 MIL/uL — ABNORMAL LOW (ref 4.22–5.81)
RDW: 14.3 % (ref 11.5–15.5)
WBC: 5 10*3/uL (ref 4.0–10.5)
nRBC: 0 % (ref 0.0–0.2)

## 2020-04-06 LAB — SODIUM
Sodium: 120 mmol/L — ABNORMAL LOW (ref 135–145)
Sodium: 119 mmol/L — CL (ref 135–145)
Sodium: 120 mmol/L — ABNORMAL LOW (ref 135–145)

## 2020-04-06 MED ORDER — SIMETHICONE 40 MG/0.6ML PO SUSP
ORAL | Status: AC
  Filled 2020-04-06: qty 0.6

## 2020-04-06 MED ORDER — SODIUM CHLORIDE 0.9 % IV SOLN: INTRAVENOUS | Status: AC

## 2020-04-06 NOTE — Progress Notes (Signed)
Critical sodium of 113, Dr. Arlana Pouch made aware.

## 2020-04-06 NOTE — TOC Progression Note (Signed)
Transition of Care [facility]) - Progression Note    Patient Details  Name: Brad Graham MRN: [id number] Date of Birth: Jun 21, 1961  [facility] CM/SW Contact  Karn [place], [place] Phone Number: 04/06/2020, 9:22 AM  Clinical Narrative:  LCSW updated Melissa at [facility] on pt. Per Melissa, pt's authorization is good for 2 weeks. TOC will continue to follow.      Expected Discharge Plan: Long Term Nursing Home Barriers to Discharge: Continued Medical Work up  Expected Discharge Plan and Services Expected Discharge Plan: Long Term Nursing Home In-house Referral: Clinical Social Work   Post Acute Care Choice: Resumption of Svcs/PTA Provider Living arrangements for the past 2 months: Skilled Nursing Facility                                       Social Determinants of Health (SDOH) Interventions    Readmission Risk Interventions Readmission Risk Prevention Plan 03/31/2020  Transportation Screening Complete  Medication Review (RN CM) Complete  Some recent data might be hidden

## 2020-04-06 NOTE — Progress Notes (Signed)
PROGRESS NOTE  Marcha DuttonDwayne A Depriest WJX:[id number] DOB: 05/31/1961 DOA: 03/30/2020 PCP: Mardene CelesteAsaro, Jessica I, NP  Brief History:  59 y.o.malewith medical history significantfor a recent hospitalization at [facility] (6/9-6/21) for cardiac arrest requiring intubation acute renal failure aspiration pneumonia who was treated in the ICU for several days and subsequently extubated and eventually discharged to [facility]. 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 He has a chronic seizure disorder and is maintained on Dilantin. He has type 2 diabetes mellitus and hypertension.   He was admitted with acute mental status changes and severe hyponatremia with sodium of 109.  His sodium levels were being monitored at the SNF reportedly and they had recommended giving him IV fluids however the patient refused and he was given oral sodium tablets.  He had been taking those reportedly during the last week.  Unfortunately his mentation continued to deteriorate and he was sent to ED for evaluation.  At baseline he is able to care for himself for the most part and normally is able to vocalize his needs.  Over the last couple of days he has been mostly nonvocal confused and this appears to be outside of his baseline.    Assessment/Plan: Hyponatremia -initially improved with IV lasix -now Na has trended down last 3 days -renal consulted -multifactorial including fluid overload and poor solute intake -NEG19L -appreciate renal assistance -IVF per renal -TSH 2.642  Acute on chronic diastolic CHF -03/05/20 Echo--EF 55-60%, G1DD -responded to IV lasix and has continued to diurese without lasix (last dose 04/03/20). -NEG 19 L -fluid management per renal  E coli UTI -had 4 days of ceftriaxone--last dosed 04/02/20  Acute metabolic  encephalopathy -overall improved -due to UTI and hyponatremia  Seizure disorder -continue dilantin  Alcohol Abuse -abstinent for past several weeks while resident at [facility]  Diabetes Mellitus type 2 -01/27/20 A1C 7.2 -d/c CBGs and ISS -CBGs well controlled  Essential Hypertension -continue coreg,  -amlodipine and hydralazine on hold  Hypomagnesemia -repleted  Hypokalemia -repleted  Anemia of chronic disease -Hgb stable      Status is: Inpatient  Remains inpatient appropriate because:IV treatments appropriate due to intensity of illness or inability to take PO Remains hyponatremic  Dispo: The patient is from: SNF              Anticipated d/c is to: SNF              Anticipated d/c date is: 3 days              Patient currently is not medically stable to d/c.        Family Communication:  no Family at bedside  Consultants:  renal  Code Status:  DNR  DVT Prophylaxis:  Carter Lovenox   Procedures: As Listed in Progress Note Above  Antibiotics: Ceftriaxone 7/7>>7/10     Subjective:  Patient denies fevers, chills, headache, chest pain, dyspnea, nausea, vomiting, diarrhea, abdominal pain, dysuria, hematuria, hematochezia, and melena.  Objective: Vitals:   04/05/20 1935 04/05/20 2055 04/06/20 0426 04/06/20 0912  BP:  134/84 (!) 160/86 136/78  Pulse:  83 90 96  Resp:  18 16 16   Temp:  98.5 F (36.9 C) 98.9 F (37.2 C)   TempSrc:      SpO2: 91% 99% 97% 100%  Weight:   106.2 kg   Height:        Intake/Output Summary (Last 24 hours) at 04/06/2020 1746 Last data filed at 04/06/2020 0900 Gross per 24 hour  Intake 240 ml  Output 1200 ml  Net -960 ml   Weight change:  Exam:   General:  Pt is alert, follows commands appropriately, not in acute distress  HEENT: No icterus, No thrush, No neck mass, [place]/AT  Cardiovascular: RRR, S1/S2, no rubs, no gallops  Respiratory: bibasilar crackles  Abdomen: Soft/+BS, non tender, non  distended, no guarding  Extremities: trace LE edema, No lymphangitis, No petechiae, No rashes, no synovitis   Data Reviewed: I have personally reviewed following labs and imaging studies Basic Metabolic Panel: Recent Labs  Lab 04/02/20 0436 04/02/20 0436 04/03/20 0530 04/03/20 0530 04/04/20 0550 04/04/20 1600 04/05/20 0453 04/06/20 0548 04/06/20 1123  NA 126*   < > 122*   < > 122* 120* 120* 119* 120*  K 3.6  --  3.6  --  3.7  --  3.7 3.6  --   CL 87*  --  83*  --  84*  --  83* 83*  --   CO2 28  --  30  --  28  --  26 25  --   GLUCOSE 101*  --  110*  --  111*  --  110* 104*  --   BUN <5*  --  6*  --  8  --  9 12  --   CREATININE 0.74  --  0.70  --  0.68  --  0.61 0.68  0.68  --   CALCIUM 8.6*  --  8.4*  --  8.6*  --  8.6* 8.7*  --   MG 1.6*  --  1.6*  --  1.6*  --  1.7 1.5*  --   PHOS  --   --   --   --  4.0  --  4.3 4.5  --    < > = values in this interval not displayed.   Liver Function Tests: Recent Labs  Lab 04/04/20 0550 04/05/20 0453 04/06/20 0548  ALBUMIN 2.5* 2.5* 2.6*   No results for input(s): LIPASE, AMYLASE in the last 168 hours. No results for input(s): AMMONIA in the last 168 hours. Coagulation Profile: No results for input(s): INR, PROTIME in the last 168 hours. CBC: Recent Labs  Lab 03/31/20 0341 03/31/20 0341 04/01/20 0458 04/02/20 0436 04/03/20 0530 04/04/20 0550 04/06/20 0548  WBC 6.9   < > 4.4 3.1* 3.9* 4.0 5.0  NEUTROABS 5.0  --  2.9 1.5* 1.8 2.1  --   HGB 8.9*   < > 8.4* 8.6* 8.3* 8.6* 8.3*  HCT 26.8*   < > 26.2* 26.9* 25.5* 26.6* 26.0*  MCV 82.7   < > 83.7 85.4 84.7 84.4 83.9  PLT 269   < > 246 286 282 271 298   < > = values in this interval not displayed.   Cardiac Enzymes: No results for input(s): CKTOTAL, CKMB, CKMBINDEX, TROPONINI in the last 168 hours. BNP: Invalid input(s): POCBNP CBG: Recent Labs  Lab 04/05/20 2056 04/06/20 0314 04/06/20 0744 04/06/20 1143 04/06/20 1641  GLUCAP 117* 121* 113* 123* 122*    HbA1C: No results for input(s): HGBA1C in the last 72 hours. Urine analysis:    Component Value Date/Time   COLORURINE YELLOW 04/04/2020 1650   APPEARANCEUR CLOUDY (A) 04/04/2020 1650   APPEARANCEUR Clear 05/02/2014 0134   LABSPEC 1.009  04/04/2020 1650   LABSPEC 1.004 05/02/2014 0134   PHURINE 8.0 04/04/2020 1650   GLUCOSEU NEGATIVE 04/04/2020 1650   GLUCOSEU Negative 05/02/2014 0134   HGBUR NEGATIVE 04/04/2020 1650   BILIRUBINUR NEGATIVE 04/04/2020 1650   BILIRUBINUR Negative 05/02/2014 0134   KETONESUR NEGATIVE 04/04/2020 1650   PROTEINUR NEGATIVE 04/04/2020 1650   NITRITE NEGATIVE 04/04/2020 1650   LEUKOCYTESUR NEGATIVE 04/04/2020 1650   LEUKOCYTESUR Negative 05/02/2014 0134   Sepsis Labs: @LABRCNTIP (procalcitonin:4,lacticidven:4) ) Recent Results (from the past 240 hour(s))  SARS Coronavirus 2 by RT PCR (hospital order, performed in [facility] hospital lab) Nasopharyngeal Nasopharyngeal Swab     Status: None   Collection Time: 03/30/20 12:04 PM   Specimen: Nasopharyngeal Swab  Result Value Ref Range Status   SARS Coronavirus 2 NEGATIVE NEGATIVE Final    Comment: (NOTE) SARS-CoV-2 target nucleic acids are NOT DETECTED.  The SARS-CoV-2 RNA is generally detectable in upper and lower respiratory specimens during the acute phase of infection. The lowest concentration of SARS-CoV-2 viral copies this assay can detect is 250 copies / mL. A negative result does not preclude SARS-CoV-2 infection and should not be used as the sole basis for treatment or other patient management decisions.  A negative result may occur with improper specimen collection / handling, submission of specimen other than nasopharyngeal swab, presence of viral mutation(s) within the areas targeted by this assay, and inadequate number of viral copies (<250 copies / mL). A negative result must be combined with clinical observations, patient history, and epidemiological information.  Fact Sheet for  Patients:   05/31/20  Fact Sheet for Healthcare Providers: BoilerBrush.com.cy  This test is not yet approved or  cleared by the https://pope.com/ FDA and has been authorized for detection and/or diagnosis of SARS-CoV-2 by FDA under an Emergency Use Authorization (EUA).  This EUA will remain in effect (meaning this test can be used) for the duration of the COVID-19 declaration under Section 564(b)(1) of the Act, 21 U.S.C. section 360bbb-3(b)(1), unless the authorization is terminated or revoked sooner.  Performed at [facility], [address], [place], [place]   Culture, Urine     Status: Abnormal   Collection Time: 03/30/20 12:11 PM   Specimen: Urine, Random  Result Value Ref Range Status   Specimen Description   Final    URINE, RANDOM Performed at [facility], [address], [place], [place]    Special Requests   Final    NONE Performed at [facility], [address], [place], [place]    Culture >=100,000 COLONIES/mL ESCHERICHIA COLI (A)  Final   Report Status 04/01/2020 FINAL  Final   Organism ID, Bacteria ESCHERICHIA COLI (A)  Final      Susceptibility   Escherichia coli - MIC*    AMPICILLIN 4 SENSITIVE Sensitive     CEFAZOLIN <=4 SENSITIVE Sensitive     CEFTRIAXONE <=0.25 SENSITIVE Sensitive     CIPROFLOXACIN <=0.25 SENSITIVE Sensitive     GENTAMICIN <=1 SENSITIVE Sensitive     IMIPENEM <=0.25 SENSITIVE Sensitive     NITROFURANTOIN 32 SENSITIVE Sensitive     TRIMETH/SULFA <=20 SENSITIVE Sensitive     AMPICILLIN/SULBACTAM <=2 SENSITIVE Sensitive     PIP/TAZO <=4 SENSITIVE Sensitive     * >=100,000 COLONIES/mL ESCHERICHIA COLI  MRSA PCR Screening     Status: None   Collection Time: 03/30/20 12:35 PM   Specimen: Nasal Mucosa; Nasopharyngeal  Result Value Ref Range Status   MRSA by PCR NEGATIVE NEGATIVE  Final    Comment:        The GeneXpert MRSA Assay (FDA approved for NASAL  specimens only), is one component of a comprehensive MRSA colonization surveillance program. It is not intended to diagnose MRSA infection nor to guide or monitor treatment for MRSA infections. Performed at [facility], [address] [place], [place] [address]   Culture, blood (Routine X 2) w Reflex to ID Panel     Status: None   Collection Time: 03/31/20  5:20 PM   Specimen: BLOOD  Result Value Ref Range Status   Specimen Description BLOOD LEFT ANTECUBITAL  Final   Special Requests   Final    BOTTLES DRAWN AEROBIC AND ANAEROBIC Blood Culture adequate volume   Culture   Final    NO GROWTH 5 DAYS Performed at [facility], [address] [place], [place] [address]    Report Status 04/05/2020 FINAL  Final  Culture, blood (Routine X 2) w Reflex to ID Panel     Status: None   Collection Time: 03/31/20  5:28 PM   Specimen: BLOOD  Result Value Ref Range Status   Specimen Description BLOOD BLOOD RIGHT WRIST  Final   Special Requests   Final    AEROBIC BOTTLE ONLY Blood Culture results may not be optimal due to an inadequate volume of blood received in culture bottles   Culture   Final    NO GROWTH 5 DAYS Performed at [facility], [address] [place], [place] [address]    Report Status 04/05/2020 FINAL  Final     Scheduled Meds: . aspirin EC  81 mg Oral Daily  . atorvastatin  20 mg Oral q1800  . carvedilol  25 mg Oral BID WC  . Chlorhexidine Gluconate Cloth  6 each Topical Daily  . cholecalciferol  2,000 Units Oral q morning - 10a  . enoxaparin (LOVENOX) injection  55 mg Subcutaneous Q24H  . folic acid  1 mg Oral Daily  . insulin aspart  0-9 Units Subcutaneous TID WC  . magnesium oxide  800 mg Oral BID  . pantoprazole  40 mg Oral Daily  . phenytoin  200 mg Oral Daily  . phenytoin  300 mg Oral QHS  . Ensure Max Protein  11 oz Oral BID  . senna-docusate  2 tablet Oral QHS  . simethicone      . sodium chloride flush  3 mL Intravenous Q12H  . tamsulosin  0.4 mg Oral QPC  supper  . thiamine  100 mg Oral Daily   Continuous Infusions: . sodium chloride 50 mL/hr at 04/06/20 1201    Procedures/Studies: CT Head Wo Contrast  Result Date: 03/30/2020 CLINICAL DATA:  Encephalopathy. Additional provided: Elevated sodium levels, lethargic. EXAM: CT HEAD WITHOUT CONTRAST TECHNIQUE: Contiguous axial images were obtained from the base of the skull through the vertex without intravenous contrast. COMPARISON:  Head CT examination 03/03/2020 and earlier FINDINGS: Brain: The examination is mild to moderately motion degraded. Stable, mild generalized parenchymal atrophy. 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 Redemonstrated chronic infarct within the right external capsule. Unchanged moderate patchy hypodensity within the cerebral white matter which is nonspecific, but consistent with chronic small vessel ischemic disease. There is no acute intracranial hemorrhage. No demarcated cortical infarct is identified. No extra-axial fluid collection. No evidence of intracranial mass. No midline shift. Incidentally noted cavum septum pellucidum and cavum vergae. Vascular: No hyperdense vessel.  Atherosclerotic calcifications. Skull: Normal. Negative for fracture or  focal lesion. Sinuses/Orbits: Visualized orbits show no acute finding. Mild ethmoid sinus mucosal thickening. Moderate right maxillary sinus mucosal thickening. No significant mastoid effusion. IMPRESSION: Mild-to-moderate motion degradation. No evidence of acute intracranial abnormality. Stable generalized parenchymal atrophy and chronic small vessel ischemic disease. Paranasal sinus mucosal thickening as described, most notably right maxillary. Electronically Signed   By: Jackey Loge DO   On: 03/30/2020 10:34   DG CHEST PORT 1 VIEW  Result Date: 04/01/2020 CLINICAL DATA:  Fever. EXAM: PORTABLE CHEST 1 VIEW COMPARISON:  03/30/2020. FINDINGS: Mediastinum and hilar structures normal.  Heart size stable. Low lung volumes. Mild left base atelectasis/infiltrate. No pleural effusion or pneumothorax. No acute bony abnormality. IMPRESSION: Low lung volumes with mild left base atelectasis/infiltrate. Similar findings noted on prior exam. Electronically Signed   By: Maisie Fus  Register   On: 04/01/2020 05:27   DG Chest Portable 1 View  Result Date: 03/30/2020 CLINICAL DATA:  Altered mental status. EXAM: PORTABLE CHEST 1 VIEW COMPARISON:  Prior chest radiograph 03/02/2020 FINDINGS: Mild cardiomegaly, unchanged. Minimal left basilar atelectasis. No appreciable airspace consolidation or frank pulmonary edema. No evidence of pleural effusion or pneumothorax. No acute bony abnormality identified. IMPRESSION: Minimal left basilar atelectasis.  Lungs otherwise clear. Mild cardiomegaly, unchanged. Electronically Signed   By: Jackey Loge DO   On: 03/30/2020 10:36    Catarina Hartshorn, DO  [facility]  If 7PM-7AM, please contact night-coverage [email or web address] Password [email or web address] 04/06/2020, 5:46 PM   LOS: 7 days

## 2020-04-06 NOTE — Progress Notes (Signed)
Patient ID: Brad Graham, male   DOB: 1960/10/24, 59 y.o.   MRN: [id number] S: Feels well but sodium is still low despite significant UF. O:BP 136/78 (BP Location: Left Arm)   Pulse 96   Temp 98.9 F (37.2 C)   Resp 16   Ht 6' (1.829 m)   Wt 106.2 kg   SpO2 100%   BMI 31.75 kg/m   Intake/Output Summary (Last 24 hours) at 04/06/2020 1136 Last data filed at 04/06/2020 0900 Gross per 24 hour  Intake 880 ml  Output 1900 ml  Net -1020 ml   Intake/Output: I/O last 3 completed shifts: In: 880 [P.O.:880] Out: 3300 [Urine:3300]  Intake/Output this shift:  Total I/O In: 240 [P.O.:240] Out: -  Weight change:  Gen: NAD CVS: no rub Resp: occ rhonchi Abd: benign Ext: no edema  Recent Labs  Lab 04/01/20 0458 04/01/20 0458 04/01/20 1357 04/02/20 0436 04/03/20 0530 04/04/20 0550 04/04/20 1600 04/05/20 0453 04/06/20 0548  NA 121*   < > 122* 126* 122* 122* 120* 120* 119*  K 3.5  --  4.1 3.6 3.6 3.7  --  3.7 3.6  CL 85*  --  87* 87* 83* 84*  --  83* 83*  CO2 26  --  25 28 30 28   --  26 25  GLUCOSE 162*  --  172* 101* 110* 111*  --  110* 104*  BUN <5*  --  5* <5* 6* 8  --  9 12  CREATININE 0.70  --  0.77 0.74 0.70 0.68  --  0.61 0.68  0.68  ALBUMIN  --   --   --   --   --  2.5*  --  2.5* 2.6*  CALCIUM 8.2*  --  8.1* 8.6* 8.4* 8.6*  --  8.6* 8.7*  PHOS  --   --   --   --   --  4.0  --  4.3 4.5   < > = values in this interval not displayed.   Liver Function Tests: Recent Labs  Lab 04/04/20 0550 04/05/20 0453 04/06/20 0548  ALBUMIN 2.5* 2.5* 2.6*   No results for input(s): LIPASE, AMYLASE in the last 168 hours. No results for input(s): AMMONIA in the last 168 hours. CBC: Recent Labs  Lab 04/01/20 0458 04/01/20 0458 04/02/20 0436 04/02/20 0436 04/03/20 0530 04/04/20 0550 04/06/20 0548  WBC 4.4   < > 3.1*   < > 3.9* 4.0 5.0  NEUTROABS 2.9   < > 1.5*  --  1.8 2.1  --   HGB 8.4*   < > 8.6*   < > 8.3* 8.6* 8.3*  HCT 26.2*   < > 26.9*   < > 25.5* 26.6* 26.0*  MCV  83.7  --  85.4  --  84.7 84.4 83.9  PLT 246   < > 286   < > 282 271 298   < > = values in this interval not displayed.   Cardiac Enzymes: No results for input(s): CKTOTAL, CKMB, CKMBINDEX, TROPONINI in the last 168 hours. CBG: Recent Labs  Lab 04/05/20 0722 04/05/20 1607 04/05/20 2056 04/06/20 0314 04/06/20 0744  GLUCAP 124* 167* 117* 121* 113*    Iron Studies: No results for input(s): IRON, TIBC, TRANSFERRIN, FERRITIN in the last 72 hours. Studies/Results: No results found. 04/08/20 aspirin EC  81 mg Oral Daily  . atorvastatin  20 mg Oral q1800  . carvedilol  25 mg Oral BID WC  . Chlorhexidine Gluconate Cloth  6  each Topical Daily  . cholecalciferol  2,000 Units Oral q morning - 10a  . enoxaparin (LOVENOX) injection  55 mg Subcutaneous Q24H  . folic acid  1 mg Oral Daily  . insulin aspart  0-9 Units Subcutaneous TID WC  . magnesium oxide  800 mg Oral BID  . pantoprazole  40 mg Oral Daily  . phenytoin  200 mg Oral Daily  . phenytoin  300 mg Oral QHS  . Ensure Max Protein  11 oz Oral BID  . senna-docusate  2 tablet Oral QHS  . simethicone      . sodium chloride flush  3 mL Intravenous Q12H  . tamsulosin  0.4 mg Oral QPC supper  . thiamine  100 mg Oral Daily    BMET    Component Value Date/Time   NA 119 (LL) 04/06/2020 0548   NA 137 02/16/2020 1426   NA 126 (L) 05/02/2014 0921   K 3.6 04/06/2020 0548   K 3.6 05/02/2014 0921   CL 83 (L) 04/06/2020 0548   CL 93 (L) 05/02/2014 0921   CO2 25 04/06/2020 0548   CO2 21 05/02/2014 0921   GLUCOSE 104 (H) 04/06/2020 0548   GLUCOSE 106 (H) 05/02/2014 0921   BUN 12 04/06/2020 0548   BUN 28 (H) 02/16/2020 1426   BUN 19 (H) 05/02/2014 0921   CREATININE 0.68 04/06/2020 0548   CREATININE 0.68 04/06/2020 0548   CREATININE 1.22 05/02/2014 0921   CALCIUM 8.7 (L) 04/06/2020 0548   CALCIUM 8.5 05/02/2014 0921   GFRNONAA >60 04/06/2020 0548   GFRNONAA >60 04/06/2020 0548   GFRNONAA >60 05/02/2014 0921   GFRAA >60 04/06/2020 0548    GFRAA >60 04/06/2020 0548   GFRAA >60 05/02/2014 0921   CBC    Component Value Date/Time   WBC 5.0 04/06/2020 0548   RBC 3.10 (L) 04/06/2020 0548   HGB 8.3 (L) 04/06/2020 0548   HGB 11.7 (L) 02/16/2020 1426   HCT 26.0 (L) 04/06/2020 0548   HCT 35.0 (L) 02/16/2020 1426   PLT 298 04/06/2020 0548   PLT 192 02/16/2020 1426   MCV 83.9 04/06/2020 0548   MCV 88 02/16/2020 1426   MCV 94 05/02/2014 0134   MCH 26.8 04/06/2020 0548   MCHC 31.9 04/06/2020 0548   RDW 14.3 04/06/2020 0548   RDW 13.3 02/16/2020 1426   RDW 13.8 05/02/2014 0134   LYMPHSABS 1.2 04/04/2020 0550   LYMPHSABS 1.0 02/16/2020 1426   MONOABS 0.5 04/04/2020 0550   EOSABS 0.1 04/04/2020 0550   EOSABS 0.0 02/16/2020 1426   BASOSABS 0.1 04/04/2020 0550   BASOSABS 0.1 02/16/2020 1426    Assessment/Plan:  1. Hyponatremia, severe- due to acute on chronic diastolic CHF with volume overload. Initially treated with 3% saline for AMS with improvement and now responding to IV lasix. Sodium levelimproved to 126 04/02/20 but dropped again to 120.  1. Sodium levels stable but have not significantly improved over the past 3 days, likely due to his malnutrition and poor protein intake.  He likely is unable to maximally dilute his urine. 2. Repeat UNa and urine osm unchanged 3. Will start gentle IVF's as he is 19 kg negative since admission and has low Cl as well.  Not taking much in.  May need to add lasix with IVF's to help force hypotonic urine 2. Hypokalemia- improved with repletion 3. Hypomagnesemia- likely due to poor oral intake and malnutrition- improved with repletion 4. Acute on chronic diastolic CHF- responded to IV lasix and  has continued to diurese without lasix (last dose 04/03/20). 5. E. Coli UTI- on ceftriaxone. 6. H/o seizure disorder- on dilantin 7. Etoh abuse- abstinent for past several weeks while resident at [facility] 8. HTN- stable Irena Cords, MD [employer] [phone]

## 2020-04-06 NOTE — Progress Notes (Signed)
CRITICAL VALUE ALERT  Critical Value:  Sodium 119  Date & Time Notified:  04/06/20 @ 2002  Provider Notified: [place]  Orders Received/Actions taken: no orders yet

## 2020-04-07 DIAGNOSIS — N3000 Acute cystitis without hematuria: Secondary | ICD-10-CM

## 2020-04-07 LAB — RENAL FUNCTION PANEL
Albumin: 2.6 g/dL — ABNORMAL LOW (ref 3.5–5.0)
Anion gap: 10 (ref 5–15)
BUN: 13 mg/dL (ref 8–23)
CO2: 24 mmol/L (ref 22–32)
Calcium: 8.6 mg/dL — ABNORMAL LOW (ref 8.9–10.3)
Chloride: 86 mmol/L — ABNORMAL LOW (ref 98–111)
Creatinine, Ser: 0.73 mg/dL (ref 0.61–1.24)
GFR calc Af Amer: >60 mL/min (ref >60)
GFR calc non Af Amer: >60 mL/min (ref >60)
Glucose, Bld: 160 mg/dL — ABNORMAL HIGH (ref 70–99)
Phosphorus: 3.8 mg/dL (ref 2.5–4.6)
Potassium: 3.6 mmol/L (ref 3.5–5.1)
Sodium: 120 mmol/L — ABNORMAL LOW (ref 135–145)

## 2020-04-07 LAB — GLUCOSE, CAPILLARY
Glucose-Capillary: 120 mg/dL — ABNORMAL HIGH (ref 70–99)
Glucose-Capillary: 113 mg/dL — ABNORMAL HIGH (ref 70–99)
Glucose-Capillary: 152 mg/dL — ABNORMAL HIGH (ref 70–99)
Glucose-Capillary: 123 mg/dL — ABNORMAL HIGH (ref 70–99)
Glucose-Capillary: 158 mg/dL — ABNORMAL HIGH (ref 70–99)

## 2020-04-07 LAB — MAGNESIUM: Magnesium: 1.5 mg/dL — ABNORMAL LOW (ref 1.7–2.4)

## 2020-04-07 LAB — SODIUM
Sodium: 118 mmol/L — CL (ref 135–145)
Sodium: 121 mmol/L — ABNORMAL LOW (ref 135–145)
Sodium: 122 mmol/L — ABNORMAL LOW (ref 135–145)
Sodium: 122 mmol/L — ABNORMAL LOW (ref 135–145)
Sodium: 124 mmol/L — ABNORMAL LOW (ref 135–145)

## 2020-04-07 MED ORDER — MAGNESIUM SULFATE 2 GM/50ML IV SOLN
2.0000 g | Freq: Once | INTRAVENOUS | Status: AC
  Administered 2020-04-07: 2 g via INTRAVENOUS
  Filled 2020-04-07: qty 50

## 2020-04-07 MED ORDER — TOLVAPTAN 15 MG PO TABS
7.5000 mg | ORAL_TABLET | Freq: Once | ORAL | Status: AC
  Administered 2020-04-07: 7.5 mg via ORAL
  Filled 2020-04-07 (×2): qty 1

## 2020-04-07 NOTE — Progress Notes (Signed)
PROGRESS NOTE  Brad Graham ZOX:[id number] DOB: [birth year] DOA: 03/30/2020 PCP: Mardene Celeste I, NP   Brief History:  58 y.o.malewith medical history significantfor a recent hospitalization at [facility] (6/9-6/21) for cardiac arrest requiring intubation acute renal failure aspiration pneumonia who was treated in the ICU for several days and subsequently extubated and eventually discharged to [facility]. 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 He has a chronic seizure disorder and is maintained on Dilantin. He has type 2 diabetes mellitus and hypertension.  He was admitted with acute mental status changes and severe hyponatremia with sodium of 109.His sodium levels were being monitored at the SNF reportedly and they had recommended giving him IV fluids however the patient refused and he was given oral sodium tablets. He had been taking those reportedly during the last week.  Unfortunately his mentation continued to deteriorate and hewas sent to ED for evaluation.At baseline he is able to care for himself for the most part and normally is able to vocalize his needs. Over the last couple of days he has been mostly nonvocal confused and this appears to be outside of his baseline.  Renal was consulted for assistance.  As his Na slowly improved, so did his mentation.   Assessment/Plan: Hyponatremia -initially improved with IV lasix -Na has trended down/remained unchanged last 3 days -renal consulted -multifactorial including fluid overload and poor solute intake -NEG19L -appreciate renal assistance -IVF per renal -TSH 2.642 -04/07/20--tolvaptan given x 1  Acute on chronic diastolic CHF -03/05/20 Echo--EF 55-60%, G1DD -responded to IV lasix and has continued to diurese without lasix (last dose  04/03/20). -NEG 19 L -fluid management per renal  E coli UTI -had 4 days of ceftriaxone--last dosed 04/02/20  Acute metabolic encephalopathy -overall improved -due to UTI and hyponatremia -pt is near his baseline presently  Seizure disorder -continue dilantin  Alcohol Abuse -abstinent for past several weeks while resident at [facility]  Diabetes Mellitus type 2 -01/27/20 A1C 7.2 -d/c CBGs and ISS -CBGs well controlled  Essential Hypertension -continue coreg,  -amlodipine and hydralazine on hold  Hypomagnesemia -repleted  Hypokalemia -repleted  Anemia of chronic disease -Hgb stable      Status is: Inpatient  Remains inpatient appropriate because:IV treatments appropriate due to intensity of illness or inability to take PO Remains hyponatremic  Dispo: The patient is from: SNF  Anticipated d/c is to: SNF  Anticipated d/c date is: 3 days  Patient currently is not medically stable to d/c.        Family Communication:  no Family at bedside  Consultants:  renal  Code Status:  DNR  DVT Prophylaxis:  [place] Lovenox   Procedures: As Listed in Progress Note Above  Antibiotics: Ceftriaxone 7/7>>7/10       Subjective: Patient denies fevers, chills, headache, chest pain, dyspnea, nausea, vomiting, diarrhea, abdominal pain, dysuria   Objective: Vitals:   04/06/20 2112 04/06/20 2136 04/07/20 0408 04/07/20 1502  BP:  119/74 136/79 121/66  Pulse:  90 89 86  Resp:  18  20  Temp:  98.7 F (37.1 C) 99 F (37.2 C) 98.3 F (36.8 C)  TempSrc:   Oral Oral  SpO2: 91% 99% 100% 98%  Weight:   106.8 kg   Height:        Intake/Output Summary (Last  24 hours) at 04/07/2020 1724 Last data filed at 04/07/2020 0300 Gross per 24 hour  Intake 960 ml  Output 1050 ml  Net -90 ml   Weight change: 0.6 kg Exam:   General:  Pt is alert, follows commands appropriately, not in acute  distress  HEENT: No icterus, No thrush, No neck mass, [place]/AT  Cardiovascular: RRR, S1/S2, no rubs, no gallops  Respiratory: bibasilar crackles. No wheeze  Abdomen: Soft/+BS, non tender, non distended, no guarding  Extremities: trace LE edema, No lymphangitis, No petechiae, No rashes, no synovitis   Data Reviewed: I have personally reviewed following labs and imaging studies Basic Metabolic Panel: Recent Labs  Lab 04/03/20 0530 04/03/20 0530 04/04/20 0550 04/04/20 1600 04/05/20 0453 04/05/20 0453 04/06/20 0548 04/06/20 1123 04/06/20 1938 04/07/20 0006 04/07/20 0416 04/07/20 0826 04/07/20 1148  NA 122*   < > 122*   < > 120*   < > 119*   < > 120* 118* 120*  121* 122* 122*  K 3.6  --  3.7  --  3.7  --  3.6  --   --   --  3.6  --   --   CL 83*  --  84*  --  83*  --  83*  --   --   --  86*  --   --   CO2 30  --  28  --  26  --  25  --   --   --  24  --   --   GLUCOSE 110*  --  111*  --  110*  --  104*  --   --   --  160*  --   --   BUN 6*  --  8  --  9  --  12  --   --   --  13  --   --   CREATININE 0.70  --  0.68  --  0.61  --  0.68  0.68  --   --   --  0.73  --   --   CALCIUM 8.4*  --  8.6*  --  8.6*  --  8.7*  --   --   --  8.6*  --   --   MG 1.6*  --  1.6*  --  1.7  --  1.5*  --   --   --  1.5*  --   --   PHOS  --   --  4.0  --  4.3  --  4.5  --   --   --  3.8  --   --    < > = values in this interval not displayed.   Liver Function Tests: Recent Labs  Lab 04/04/20 0550 04/05/20 0453 04/06/20 0548 04/07/20 0416  ALBUMIN 2.5* 2.5* 2.6* 2.6*   No results for input(s): LIPASE, AMYLASE in the last 168 hours. No results for input(s): AMMONIA in the last 168 hours. Coagulation Profile: No results for input(s): INR, PROTIME in the last 168 hours. CBC: Recent Labs  Lab 04/01/20 0458 04/02/20 0436 04/03/20 0530 04/04/20 0550 04/06/20 0548  WBC 4.4 3.1* 3.9* 4.0 5.0  NEUTROABS 2.9 1.5* 1.8 2.1  --   HGB 8.4* 8.6* 8.3* 8.6* 8.3*  HCT 26.2* 26.9* 25.5* 26.6*  26.0*  MCV 83.7 85.4 84.7 84.4 83.9  PLT 246 286 282 271 298   Cardiac Enzymes: No results for input(s): CKTOTAL, CKMB, CKMBINDEX, TROPONINI in the last 168 hours.  BNP: Invalid input(s): POCBNP CBG: Recent Labs  Lab 04/06/20 2132 04/07/20 0302 04/07/20 0813 04/07/20 1146 04/07/20 1649  GLUCAP 169* 120* 113* 152* 123*   HbA1C: No results for input(s): HGBA1C in the last 72 hours. Urine analysis:    Component Value Date/Time   COLORURINE YELLOW 04/04/2020 1650   APPEARANCEUR CLOUDY (A) 04/04/2020 1650   APPEARANCEUR Clear 05/02/2014 0134   LABSPEC 1.009 04/04/2020 1650   LABSPEC 1.004 05/02/2014 0134   PHURINE 8.0 04/04/2020 1650   GLUCOSEU NEGATIVE 04/04/2020 1650   GLUCOSEU Negative 05/02/2014 0134   HGBUR NEGATIVE 04/04/2020 1650   BILIRUBINUR NEGATIVE 04/04/2020 1650   BILIRUBINUR Negative 05/02/2014 0134   KETONESUR NEGATIVE 04/04/2020 1650   PROTEINUR NEGATIVE 04/04/2020 1650   NITRITE NEGATIVE 04/04/2020 1650   LEUKOCYTESUR NEGATIVE 04/04/2020 1650   LEUKOCYTESUR Negative 05/02/2014 0134   Sepsis Labs: @LABRCNTIP (procalcitonin:4,lacticidven:4) ) Recent Results (from the past 240 hour(s))  SARS Coronavirus 2 by RT PCR (hospital order, performed in [facility] hospital lab) Nasopharyngeal Nasopharyngeal Swab     Status: None   Collection Time: 03/30/20 12:04 PM   Specimen: Nasopharyngeal Swab  Result Value Ref Range Status   SARS Coronavirus 2 NEGATIVE NEGATIVE Final    Comment: (NOTE) SARS-CoV-2 target nucleic acids are NOT DETECTED.  The SARS-CoV-2 RNA is generally detectable in upper and lower respiratory specimens during the acute phase of infection. The lowest concentration of SARS-CoV-2 viral copies this assay can detect is 250 copies / mL. A negative result does not preclude SARS-CoV-2 infection and should not be used as the sole basis for treatment or other patient management decisions.  A negative result may occur with improper specimen collection  / handling, submission of specimen other than nasopharyngeal swab, presence of viral mutation(s) within the areas targeted by this assay, and inadequate number of viral copies (<250 copies / mL). A negative result must be combined with clinical observations, patient history, and epidemiological information.  Fact Sheet for Patients:   05/31/20  Fact Sheet for Healthcare Providers: BoilerBrush.com.cy  This test is not yet approved or  cleared by the https://pope.com/ FDA and has been authorized for detection and/or diagnosis of SARS-CoV-2 by FDA under an Emergency Use Authorization (EUA).  This EUA will remain in effect (meaning this test can be used) for the duration of the COVID-19 declaration under Section 564(b)(1) of the Act, 21 U.S.C. section 360bbb-3(b)(1), unless the authorization is terminated or revoked sooner.  Performed at [facility], [address], [place], [place]   Culture, Urine     Status: Abnormal   Collection Time: 03/30/20 12:11 PM   Specimen: Urine, Random  Result Value Ref Range Status   Specimen Description   Final    URINE, RANDOM Performed at [facility], [address] [place], [place]    Special Requests   Final    NONE Performed at [facility], [address] [place], [place]    Culture >=100,000 COLONIES/mL ESCHERICHIA COLI (A)  Final   Report Status 04/01/2020 FINAL  Final   Organism ID, Bacteria ESCHERICHIA COLI (A)  Final      Susceptibility   Escherichia coli - MIC*    AMPICILLIN 4 SENSITIVE Sensitive     CEFAZOLIN <=4 SENSITIVE Sensitive     CEFTRIAXONE <=0.25 SENSITIVE Sensitive     CIPROFLOXACIN <=0.25 SENSITIVE Sensitive     GENTAMICIN <=1 SENSITIVE Sensitive     IMIPENEM <=0.25 SENSITIVE Sensitive     NITROFURANTOIN 32 SENSITIVE  Sensitive     TRIMETH/SULFA <=20 SENSITIVE Sensitive     AMPICILLIN/SULBACTAM <=2 SENSITIVE Sensitive     PIP/TAZO <=4  SENSITIVE Sensitive     * >=100,000 COLONIES/mL ESCHERICHIA COLI  MRSA PCR Screening     Status: None   Collection Time: 03/30/20 12:35 PM   Specimen: Nasal Mucosa; Nasopharyngeal  Result Value Ref Range Status   MRSA by PCR NEGATIVE NEGATIVE Final    Comment:        The GeneXpert MRSA Assay (FDA approved for NASAL specimens only), is one component of a comprehensive MRSA colonization surveillance program. It is not intended to diagnose MRSA infection nor to guide or monitor treatment for MRSA infections. Performed at [facility], [address], [place], [place] [address]   Culture, blood (Routine X 2) w Reflex to ID Panel     Status: None   Collection Time: 03/31/20  5:20 PM   Specimen: BLOOD  Result Value Ref Range Status   Specimen Description BLOOD LEFT ANTECUBITAL  Final   Special Requests   Final    BOTTLES DRAWN AEROBIC AND ANAEROBIC Blood Culture adequate volume   Culture   Final    NO GROWTH 5 DAYS Performed at [facility], [address], [place], [place] [address]    Report Status 04/05/2020 FINAL  Final  Culture, blood (Routine X 2) w Reflex to ID Panel     Status: None   Collection Time: 03/31/20  5:28 PM   Specimen: BLOOD  Result Value Ref Range Status   Specimen Description BLOOD BLOOD RIGHT WRIST  Final   Special Requests   Final    AEROBIC BOTTLE ONLY Blood Culture results may not be optimal due to an inadequate volume of blood received in culture bottles   Culture   Final    NO GROWTH 5 DAYS Performed at [facility], [address] [place], [place] [address]    Report Status 04/05/2020 FINAL  Final     Scheduled Meds: . aspirin EC  81 mg Oral Daily  . atorvastatin  20 mg Oral q1800  . carvedilol  25 mg Oral BID WC  . Chlorhexidine Gluconate Cloth  6 each Topical Daily  . cholecalciferol  2,000 Units Oral q morning - 10a  . enoxaparin (LOVENOX) injection  55 mg Subcutaneous Q24H  . folic acid  1 mg Oral Daily  . magnesium oxide  800 mg Oral BID   . pantoprazole  40 mg Oral Daily  . phenytoin  200 mg Oral Daily  . phenytoin  300 mg Oral QHS  . Ensure Max Protein  11 oz Oral BID  . senna-docusate  2 tablet Oral QHS  . sodium chloride flush  3 mL Intravenous Q12H  . tamsulosin  0.4 mg Oral QPC supper  . thiamine  100 mg Oral Daily   Continuous Infusions:  Procedures/Studies: CT Head Wo Contrast  Result Date: 03/30/2020 CLINICAL DATA:  Encephalopathy. Additional provided: Elevated sodium levels, lethargic. EXAM: CT HEAD WITHOUT CONTRAST TECHNIQUE: Contiguous axial images were obtained from the base of the skull through the vertex without intravenous contrast. COMPARISON:  Head CT examination 03/03/2020 and earlier FINDINGS: Brain: The examination is mild to moderately motion degraded. Stable, mild generalized parenchymal atrophy. 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 Redemonstrated chronic infarct within the right external capsule. Unchanged moderate patchy hypodensity within the cerebral white matter which is nonspecific, but consistent with chronic small vessel ischemic disease. There is  no acute intracranial hemorrhage. No demarcated cortical infarct is identified. No extra-axial fluid collection. No evidence of intracranial mass. No midline shift. Incidentally noted cavum septum pellucidum and cavum vergae. Vascular: No hyperdense vessel.  Atherosclerotic calcifications. Skull: Normal. Negative for fracture or focal lesion. Sinuses/Orbits: Visualized orbits show no acute finding. Mild ethmoid sinus mucosal thickening. Moderate right maxillary sinus mucosal thickening. No significant mastoid effusion. IMPRESSION: Mild-to-moderate motion degradation. No evidence of acute intracranial abnormality. Stable generalized parenchymal atrophy and chronic small vessel ischemic disease. Paranasal sinus mucosal thickening as described, most notably right maxillary. Electronically Signed   By: Jackey Loge  DO   On: 03/30/2020 10:34   DG CHEST PORT 1 VIEW  Result Date: 04/01/2020 CLINICAL DATA:  Fever. EXAM: PORTABLE CHEST 1 VIEW COMPARISON:  03/30/2020. FINDINGS: Mediastinum and hilar structures normal. Heart size stable. Low lung volumes. Mild left base atelectasis/infiltrate. No pleural effusion or pneumothorax. No acute bony abnormality. IMPRESSION: Low lung volumes with mild left base atelectasis/infiltrate. Similar findings noted on prior exam. Electronically Signed   By: Maisie Fus  Register   On: 04/01/2020 05:27   DG Chest Portable 1 View  Result Date: 03/30/2020 CLINICAL DATA:  Altered mental status. EXAM: PORTABLE CHEST 1 VIEW COMPARISON:  Prior chest radiograph 03/02/2020 FINDINGS: Mild cardiomegaly, unchanged. Minimal left basilar atelectasis. No appreciable airspace consolidation or frank pulmonary edema. No evidence of pleural effusion or pneumothorax. No acute bony abnormality identified. IMPRESSION: Minimal left basilar atelectasis.  Lungs otherwise clear. Mild cardiomegaly, unchanged. Electronically Signed   By: Jackey Loge DO   On: 03/30/2020 10:36    Catarina Hartshorn, DO  [facility]  If 7PM-7AM, please contact night-coverage [email or web address] Password [facility] 04/07/2020, 5:24 PM   LOS: 8 days

## 2020-04-07 NOTE — Progress Notes (Signed)
[facility] - nephrology Progress Note   Patient ID: Brad Graham, male   DOB: 05-03-1961, 59 y.o.   MRN: [id number]  Subjective got normal saline limited duration on 7/14 without improvement in hyponatremia - 50 cc/hr is running in on my arrival.   Had 1.1 liters UOP over 7/14 charted.  Spoke with his nurse who states he hasn't had before today but that dsyarthria and difficulty with speech appears to be his baseline.  Review of systems: Denies shortness of breath or chest pain  Denies n/v  O:BP 136/79 (BP Location: Right Arm)   Pulse 89   Temp 99 F (37.2 C) (Oral)   Resp 18   Ht 6' (1.829 m)   Wt 106.8 kg   SpO2 100%   BMI 31.93 kg/m   Intake/Output Summary (Last 24 hours) at 04/07/2020 0907 Last data filed at 04/07/2020 0300 Gross per 24 hour  Intake 960 ml  Output 1050 ml  Net -90 ml   Intake/Output: I/O last 3 completed shifts: In: 1200 [P.O.:1200] Out: 2250 [Urine:2250]  Intake/Output this shift:  No intake/output data recorded. Weight change: 0.6 kg  Gen: adult male in bed in NAD  CVS: S1S2 no rub Resp: clear and unlabored room air  Abd: soft/nt/nd  Ext: no pitting edema appreciated Neuro - oriented to person and location and conversant thought dysarthria and difficulty with speech   Recent Labs  Lab 04/01/20 1357 04/01/20 1357 04/02/20 0436 04/02/20 0436 04/03/20 0530 04/03/20 0530 04/04/20 0550 04/04/20 1600 04/05/20 0453 04/06/20 0548 04/06/20 1123 04/06/20 1516 04/06/20 1938 04/07/20 0006 04/07/20 0416  NA 122*   < > 126*   < > 122*   < > 122*   < > 120* 119* 120* 119* 120* 118* 120*  121*  K 4.1  --  3.6  --  3.6  --  3.7  --  3.7 3.6  --   --   --   --  3.6  CL 87*  --  87*  --  83*  --  84*  --  83* 83*  --   --   --   --  86*  CO2 25  --  28  --  30  --  28  --  26 25  --   --   --   --  24  GLUCOSE 172*  --  101*  --  110*  --  111*  --  110* 104*  --   --   --   --  160*  BUN 5*  --  <5*  --  6*  --  8  --  9 12  --   --   --    --  13  CREATININE 0.77  --  0.74  --  0.70  --  0.68  --  0.61 0.68  0.68  --   --   --   --  0.73  ALBUMIN  --   --   --   --   --   --  2.5*  --  2.5* 2.6*  --   --   --   --  2.6*  CALCIUM 8.1*  --  8.6*  --  8.4*  --  8.6*  --  8.6* 8.7*  --   --   --   --  8.6*  PHOS  --   --   --   --   --   --  4.0  --  4.3 4.5  --   --   --   --  3.8   < > = values in this interval not displayed.   Liver Function Tests: Recent Labs  Lab 04/05/20 0453 04/06/20 0548 04/07/20 0416  ALBUMIN 2.5* 2.6* 2.6*   No results for input(s): LIPASE, AMYLASE in the last 168 hours. No results for input(s): AMMONIA in the last 168 hours. CBC: Recent Labs  Lab 04/01/20 0458 04/01/20 0458 04/02/20 0436 04/02/20 0436 04/03/20 0530 04/04/20 0550 04/06/20 0548  WBC 4.4   < > 3.1*   < > 3.9* 4.0 5.0  NEUTROABS 2.9   < > 1.5*  --  1.8 2.1  --   HGB 8.4*   < > 8.6*   < > 8.3* 8.6* 8.3*  HCT 26.2*   < > 26.9*   < > 25.5* 26.6* 26.0*  MCV 83.7  --  85.4  --  84.7 84.4 83.9  PLT 246   < > 286   < > 282 271 298   < > = values in this interval not displayed.   Cardiac Enzymes: No results for input(s): CKTOTAL, CKMB, CKMBINDEX, TROPONINI in the last 168 hours. CBG: Recent Labs  Lab 04/06/20 1143 04/06/20 1641 04/06/20 2132 04/07/20 0302 04/07/20 0813  GLUCAP 123* 122* 169* 120* 113*    Iron Studies: No results for input(s): IRON, TIBC, TRANSFERRIN, FERRITIN in the last 72 hours. Studies/Results: No results found. Marland Kitchen aspirin EC  81 mg Oral Daily  . atorvastatin  20 mg Oral q1800  . carvedilol  25 mg Oral BID WC  . Chlorhexidine Gluconate Cloth  6 each Topical Daily  . cholecalciferol  2,000 Units Oral q morning - 10a  . enoxaparin (LOVENOX) injection  55 mg Subcutaneous Q24H  . folic acid  1 mg Oral Daily  . magnesium oxide  800 mg Oral BID  . pantoprazole  40 mg Oral Daily  . phenytoin  200 mg Oral Daily  . phenytoin  300 mg Oral QHS  . Ensure Max Protein  11 oz Oral BID  . senna-docusate  2  tablet Oral QHS  . sodium chloride flush  3 mL Intravenous Q12H  . tamsulosin  0.4 mg Oral QPC supper  . thiamine  100 mg Oral Daily    BMET    Component Value Date/Time   NA 121 (L) 04/07/2020 0416   NA 120 (L) 04/07/2020 0416   NA 137 02/16/2020 1426   NA 126 (L) 05/02/2014 0921   K 3.6 04/07/2020 0416   K 3.6 05/02/2014 0921   CL 86 (L) 04/07/2020 0416   CL 93 (L) 05/02/2014 0921   CO2 24 04/07/2020 0416   CO2 21 05/02/2014 0921   GLUCOSE 160 (H) 04/07/2020 0416   GLUCOSE 106 (H) 05/02/2014 0921   BUN 13 04/07/2020 0416   BUN 28 (H) 02/16/2020 1426   BUN 19 (H) 05/02/2014 0921   CREATININE 0.73 04/07/2020 0416   CREATININE 1.22 05/02/2014 0921   CALCIUM 8.6 (L) 04/07/2020 0416   CALCIUM 8.5 05/02/2014 0921   GFRNONAA >60 04/07/2020 0416   GFRNONAA >60 05/02/2014 0921   GFRAA >60 04/07/2020 0416   GFRAA >60 05/02/2014 0921   CBC    Component Value Date/Time   WBC 5.0 04/06/2020 0548   RBC 3.10 (L) 04/06/2020 0548   HGB 8.3 (L) 04/06/2020 0548   HGB 11.7 (L) 02/16/2020 1426   HCT 26.0 (L) 04/06/2020 0548   HCT 35.0 (L) 02/16/2020  1426   PLT 298 04/06/2020 0548   PLT 192 02/16/2020 1426   MCV 83.9 04/06/2020 0548   MCV 88 02/16/2020 1426   MCV 94 05/02/2014 0134   MCH 26.8 04/06/2020 0548   MCHC 31.9 04/06/2020 0548   RDW 14.3 04/06/2020 0548   RDW 13.3 02/16/2020 1426   RDW 13.8 05/02/2014 0134   LYMPHSABS 1.2 04/04/2020 0550   LYMPHSABS 1.0 02/16/2020 1426   MONOABS 0.5 04/04/2020 0550   EOSABS 0.1 04/04/2020 0550   EOSABS 0.0 02/16/2020 1426   BASOSABS 0.1 04/04/2020 0550   BASOSABS 0.1 02/16/2020 1426    Assessment/Plan:  1. Hyponatremia, severe- due to acute on chronic diastolic CHF with volume overload. Initially treated with 3% saline for AMS with improvement and now responding to IV lasix. Sodium levelimproved to 126 04/02/20 but dropped again to 120. Sodium levels stable but have not significantly improved over the past few likely due to  his malnutrition and poor protein intake.  He likely is unable to maximally dilute his urine. Repeat UNa and urine osm unchanged.  Has diuresed quite a bit since admission  1. Tolvaptan 7.5 mg once today and discontinue any fluid restriction today 2. Check Na every 6 hours x 24 hours after tolvaptan 2. Hypokalemia- improved with repletion 3. Hypomagnesemia- likely due to poor oral intake and malnutrition- magnesium IV on 7/15 ordered. 4. Acute on chronic diastolic CHF- responded to IV lasix and has continued to diurese without lasix (last dose 04/03/20).  As above 5. E. Coli UTI- s/p abx per primary   6. H/o seizure disorder- on dilantin 7. Etoh abuse- abstinent for past several weeks while resident at [facility] 8. HTN- acceptable  9. Anemia - normocytic - no acute indication for PRBC's  Estanislado Emms, MD 04/07/2020  9:26 AM

## 2020-04-08 ENCOUNTER — Inpatient Hospital Stay (HOSPITAL_COMMUNITY): Payer: Medicaid Other

## 2020-04-08 LAB — RENAL FUNCTION PANEL
Albumin: 2.6 g/dL — ABNORMAL LOW (ref 3.5–5.0)
Anion gap: 9 (ref 5–15)
BUN: 10 mg/dL (ref 8–23)
CO2: 26 mmol/L (ref 22–32)
Calcium: 9 mg/dL (ref 8.9–10.3)
Chloride: 91 mmol/L — ABNORMAL LOW (ref 98–111)
Creatinine, Ser: 0.72 mg/dL (ref 0.61–1.24)
GFR calc Af Amer: >60 mL/min (ref >60)
GFR calc non Af Amer: >60 mL/min (ref >60)
Glucose, Bld: 114 mg/dL — ABNORMAL HIGH (ref 70–99)
Phosphorus: 4.5 mg/dL (ref 2.5–4.6)
Potassium: 4 mmol/L (ref 3.5–5.1)
Sodium: 126 mmol/L — ABNORMAL LOW (ref 135–145)

## 2020-04-08 LAB — SODIUM
Sodium: 124 mmol/L — ABNORMAL LOW (ref 135–145)
Sodium: 126 mmol/L — ABNORMAL LOW (ref 135–145)
Sodium: 125 mmol/L — ABNORMAL LOW (ref 135–145)

## 2020-04-08 LAB — IRON AND TIBC
Iron: 27 ug/dL — ABNORMAL LOW (ref 45–182)
Saturation Ratios: 12 % — ABNORMAL LOW (ref 17.9–39.5)
TIBC: 220 ug/dL — ABNORMAL LOW (ref 250–450)
UIBC: 193 ug/dL

## 2020-04-08 LAB — GLUCOSE, CAPILLARY
Glucose-Capillary: 123 mg/dL — ABNORMAL HIGH (ref 70–99)
Glucose-Capillary: 158 mg/dL — ABNORMAL HIGH (ref 70–99)
Glucose-Capillary: 142 mg/dL — ABNORMAL HIGH (ref 70–99)

## 2020-04-08 LAB — MAGNESIUM: Magnesium: 1.6 mg/dL — ABNORMAL LOW (ref 1.7–2.4)

## 2020-04-08 LAB — SARS CORONAVIRUS 2 BY RT PCR (HOSPITAL ORDER, PERFORMED IN ~~LOC~~ HOSPITAL LAB): SARS Coronavirus 2: NEGATIVE

## 2020-04-08 LAB — FERRITIN: Ferritin: 456 ng/mL — ABNORMAL HIGH (ref 24–336)

## 2020-04-08 MED ORDER — TOLVAPTAN 15 MG PO TABS
15.0000 mg | ORAL_TABLET | Freq: Once | ORAL | Status: AC
  Administered 2020-04-08: 15 mg via ORAL
  Filled 2020-04-08: qty 1

## 2020-04-08 MED ORDER — FERUMOXYTOL INJECTION 510 MG/17 ML
INTRAVENOUS | Status: AC
  Filled 2020-04-08: qty 17

## 2020-04-08 MED ORDER — SODIUM CHLORIDE 0.9 % IV SOLN
510.0000 mg | Freq: Once | INTRAVENOUS | Status: AC
  Administered 2020-04-08: 510 mg via INTRAVENOUS
  Filled 2020-04-08: qty 17

## 2020-04-08 MED ORDER — MAGNESIUM SULFATE 2 GM/50ML IV SOLN
2.0000 g | Freq: Once | INTRAVENOUS | Status: AC
  Administered 2020-04-08: 2 g via INTRAVENOUS
  Filled 2020-04-08: qty 50

## 2020-04-08 MED ORDER — ATORVASTATIN CALCIUM 80 MG PO TABS
80.0000 mg | ORAL_TABLET | Freq: Every day | ORAL | Status: DC
  Administered 2020-04-08 – 2020-04-14 (×7): 80 mg via ORAL
  Filled 2020-04-08 (×7): qty 1
  Filled 2020-04-08: qty 2

## 2020-04-08 NOTE — Progress Notes (Signed)
Pt c/o leeft arm/wrist pain, states started last night. Denies any injury. Also states right arm feels weak, can't hold fork or spoon. Right hand looks more swollen than 2 days ago. Pt states it feels tight. Can use thumb and pointer finger to feed self and able to hold cup to drink, but decreased fine motor skills for right hand. MD notified of change.

## 2020-04-08 NOTE — Progress Notes (Signed)
Covid swab sent to lab at this time.  ?

## 2020-04-08 NOTE — Progress Notes (Signed)
[facility] - nephrology Progress Note   Patient ID: Brad Graham, male   DOB: September 17, 1961, 59 y.o.   MRN: [id number]  Subjective got tolvaptan 7.5 mg once on 7/15 at 11:46 am.  Had 2.1 liters UOP over 7/15 charted.  Feels good - hoping to get out soon but understands importance of fixing his labs   Review of systems:   Denies shortness of breath or chest pain  Denies n/v Just had breakfast  O:BP (!) 142/69 (BP Location: Right Arm)   Pulse 91   Temp 98.9 F (37.2 C)   Resp 20   Ht 6' (1.829 m)   Wt 106.8 kg   SpO2 99%   BMI 31.93 kg/m   Intake/Output Summary (Last 24 hours) at 04/08/2020 0858 Last data filed at 04/08/2020 0441 Gross per 24 hour  Intake 300 ml  Output 2125 ml  Net -1825 ml   Intake/Output: I/O last 3 completed shifts: In: 780 [P.O.:780] Out: 3175 [Urine:3175]  Intake/Output this shift:  No intake/output data recorded. Weight change:   Gen: adult male in bed in NAD  CVS: S1S2 no rub Resp: clear and unlabored room air  Abd: soft/nt/nd  Ext: no pitting edema appreciated of lower extremities Neuro - oriented to person and location and conversant thought dysarthria and difficulty with speech   Recent Labs  Lab 04/02/20 0436 04/02/20 0436 04/03/20 0530 04/03/20 0530 04/04/20 0550 04/04/20 1600 04/05/20 0453 04/05/20 0453 04/06/20 0548 04/06/20 1123 04/07/20 0006 04/07/20 0416 04/07/20 0826 04/07/20 1148 04/07/20 1746 04/08/20 0137 04/08/20 0631  NA 126*   < > 122*   < > 122*   < > 120*   < > 119*   < > 118* 120*  121* 122* 122* 124* 124* 126*  K 3.6  --  3.6  --  3.7  --  3.7  --  3.6  --   --  3.6  --   --   --   --  4.0  CL 87*  --  83*  --  84*  --  83*  --  83*  --   --  86*  --   --   --   --  91*  CO2 28  --  30  --  28  --  26  --  25  --   --  24  --   --   --   --  26  GLUCOSE 101*  --  110*  --  111*  --  110*  --  104*  --   --  160*  --   --   --   --  114*  BUN <5*  --  6*  --  8  --  9  --  12  --   --  13  --   --   --    --  10  CREATININE 0.74  --  0.70  --  0.68  --  0.61  --  0.68  0.68  --   --  0.73  --   --   --   --  0.72  ALBUMIN  --   --   --   --  2.5*  --  2.5*  --  2.6*  --   --  2.6*  --   --   --   --  2.6*  CALCIUM 8.6*  --  8.4*  --  8.6*  --  8.6*  --  8.7*  --   --  8.6*  --   --   --   --  9.0  PHOS  --   --   --   --  4.0  --  4.3  --  4.5  --   --  3.8  --   --   --   --  4.5   < > = values in this interval not displayed.   Liver Function Tests: Recent Labs  Lab 04/06/20 0548 04/07/20 0416 04/08/20 0631  ALBUMIN 2.6* 2.6* 2.6*   No results for input(s): LIPASE, AMYLASE in the last 168 hours. No results for input(s): AMMONIA in the last 168 hours. CBC: Recent Labs  Lab 04/02/20 0436 04/02/20 0436 04/03/20 0530 04/04/20 0550 04/06/20 0548  WBC 3.1*   < > 3.9* 4.0 5.0  NEUTROABS 1.5*  --  1.8 2.1  --   HGB 8.6*   < > 8.3* 8.6* 8.3*  HCT 26.9*   < > 25.5* 26.6* 26.0*  MCV 85.4  --  84.7 84.4 83.9  PLT 286   < > 282 271 298   < > = values in this interval not displayed.   Cardiac Enzymes: No results for input(s): CKTOTAL, CKMB, CKMBINDEX, TROPONINI in the last 168 hours. CBG: Recent Labs  Lab 04/07/20 0813 04/07/20 1146 04/07/20 1649 04/07/20 2126 04/08/20 0721  GLUCAP 113* 152* 123* 158* 123*    Iron Studies: No results for input(s): IRON, TIBC, TRANSFERRIN, FERRITIN in the last 72 hours. Studies/Results: No results found. Marland Kitchen aspirin EC  81 mg Oral Daily  . atorvastatin  20 mg Oral q1800  . carvedilol  25 mg Oral BID WC  . Chlorhexidine Gluconate Cloth  6 each Topical Daily  . cholecalciferol  2,000 Units Oral q morning - 10a  . enoxaparin (LOVENOX) injection  55 mg Subcutaneous Q24H  . folic acid  1 mg Oral Daily  . magnesium oxide  800 mg Oral BID  . pantoprazole  40 mg Oral Daily  . phenytoin  200 mg Oral Daily  . phenytoin  300 mg Oral QHS  . Ensure Max Protein  11 oz Oral BID  . senna-docusate  2 tablet Oral QHS  . sodium chloride flush  3 mL  Intravenous Q12H  . tamsulosin  0.4 mg Oral QPC supper  . thiamine  100 mg Oral Daily    BMET    Component Value Date/Time   NA 126 (L) 04/08/2020 0631   NA 137 02/16/2020 1426   NA 126 (L) 05/02/2014 0921   K 4.0 04/08/2020 0631   K 3.6 05/02/2014 0921   CL 91 (L) 04/08/2020 0631   CL 93 (L) 05/02/2014 0921   CO2 26 04/08/2020 0631   CO2 21 05/02/2014 0921   GLUCOSE 114 (H) 04/08/2020 0631   GLUCOSE 106 (H) 05/02/2014 0921   BUN 10 04/08/2020 0631   BUN 28 (H) 02/16/2020 1426   BUN 19 (H) 05/02/2014 0921   CREATININE 0.72 04/08/2020 0631   CREATININE 1.22 05/02/2014 0921   CALCIUM 9.0 04/08/2020 0631   CALCIUM 8.5 05/02/2014 0921   GFRNONAA >60 04/08/2020 0631   GFRNONAA >60 05/02/2014 0921   GFRAA >60 04/08/2020 0631   GFRAA >60 05/02/2014 0921   CBC    Component Value Date/Time   WBC 5.0 04/06/2020 0548   RBC 3.10 (L) 04/06/2020 0548   HGB 8.3 (L) 04/06/2020 0548   HGB 11.7 (L) 02/16/2020 1426   HCT 26.0 (L)  04/06/2020 0548   HCT 35.0 (L) 02/16/2020 1426   PLT 298 04/06/2020 0548   PLT 192 02/16/2020 1426   MCV 83.9 04/06/2020 0548   MCV 88 02/16/2020 1426   MCV 94 05/02/2014 0134   MCH 26.8 04/06/2020 0548   MCHC 31.9 04/06/2020 0548   RDW 14.3 04/06/2020 0548   RDW 13.3 02/16/2020 1426   RDW 13.8 05/02/2014 0134   LYMPHSABS 1.2 04/04/2020 0550   LYMPHSABS 1.0 02/16/2020 1426   MONOABS 0.5 04/04/2020 0550   EOSABS 0.1 04/04/2020 0550   EOSABS 0.0 02/16/2020 1426   BASOSABS 0.1 04/04/2020 0550   BASOSABS 0.1 02/16/2020 1426    Assessment/Plan:  1. Hyponatremia, severe- due to acute on chronic diastolic CHF with volume overload. Initially treated with 3% saline for AMS with improvement and now responding to IV lasix. Sodium levelimproved to 126 04/02/20 but dropped again to 120. Sodium levels stable but have not significantly improved over the past few likely due to his malnutrition and poor protein intake.  He likely is unable to maximally dilute  his urine. Repeat UNa and urine osm unchanged.  Has diuresed quite a bit since admission  1. Tolvaptan 7.5 mg once on 7/15 at 11:46 am.  2. Wait full 24 hours and then will redose of tolvaptan at 15 mg PO once today.  Check Na every 8 hours x 24 hours after this dose of tolvaptan   2. Hypokalemia- improved with repletion 3. Hypomagnesemia- likely due to poor oral intake and malnutrition- magnesium IV on 7/15. Add on mag 4. Acute on chronic diastolic CHF- responded to IV lasix and has continued to diurese without lasix (last dose 04/03/20).  As above 5. E. Coli UTI- s/p abx per primary   6. H/o seizure disorder- on dilantin 7. Etoh abuse- abstinent for past several weeks while resident at [facility] 8. HTN- acceptable  9. Anemia - normocytic - no acute indication for PRBC's. Iron replete on 01/2020 check.  B12 and folate ok at that time as well.  Update iron panel  Estanislado Emms, MD 04/08/2020  9:11 AM

## 2020-04-08 NOTE — Progress Notes (Signed)
PROGRESS NOTE  Brad Graham:[id number] DOB: August 08, 1961 DOA: 03/30/2020 PCP: Mardene Celeste I, NP  Brief History: 59 y.o.malewith medical history significantfor a recent hospitalization at [facility](6/9-6/21)for cardiac arrest requiring intubation acute renal failure aspiration pneumonia who was treated in the ICU for several days and subsequently extubated and eventually discharged to [facility]. 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 He has a chronic seizure disorder and is maintained on Dilantin. He has type 2 diabetes mellitus and hypertension.  He was admitted with acute mental status changes and severe hyponatremia with sodium of 109.His sodium levels were being monitored at the SNF reportedly and they had recommended giving him IV fluids however the patient refused and he was given oral sodium tablets. He had been taking those reportedly during the last week.  Unfortunately his mentation continued to deteriorate and hewas sent to ED for evaluation.At baseline he is able to care for himself for the most part and normally is able to vocalize his needs. Over the last couple of days he has been mostly nonvocal confused and this appears to be outside of his baseline.  Renal was consulted for assistance.  As his Na slowly improved, so did his mentation. In am of 04/08/20, patient woke up with some right arm weakness.  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  He is being [name] for neurology evaluation.  Nephrology was also notified of transfer.  Assessment/Plan: Hyponatremia -initially improved with IV lasix, then trended down again -renal consult appreciated -multifactorial including fluid overload and poor solute intake -NEG19L -appreciate renal  assistance -IVF per renal-->stopped -TSH 2.642 -04/07/20--tolvaptan given x 1, plan to redose 04/08/20 -Na trending up with tovalptan  Acute on chronic diastolic CHF -03/05/20 Echo--EF 55-60%, G1DD -responded to IV lasix and has continued to diurese without lasix (last dose 04/03/20). -I/Os incomplete but NEG 19 L -fluid management per renal  Right hemiparesis/Acute Ischemic Stroke -RN noted some right hemiparesis during 04/08/20 AM assessment -MR brain-acute infarct in left frontal gyrus -discussed with neurology, Dr. Mable Fill to [facility] for neurology consultation -Dr. Wilford Corner requested CTA H&N which has been ordered -check A1C, lipid panel  E coli UTI -had 4 days of ceftriaxone--last dosed 04/02/20  Acute metabolic encephalopathy -overall improved -due to UTI and hyponatremia -pt is near his baseline presently  Seizure disorder -continue dilantin  Alcohol Abuse -abstinent for past several weeks while resident at [facility]  Diabetes Mellitus type 2 -01/27/20 A1C 7.2 -d/c CBGs and ISS -CBGs well controlled  Essential Hypertension -continue coreg,  -amlodipine and hydralazine on hold  Hypomagnesemia -repleted  Hypokalemia -repleted  Anemia of chronic disease -Hgb stable -no indication for PRBC currently      Status is: Inpatient  Remains inpatient appropriate because:IV treatments appropriate due to intensity of illness or inability to take PO Remains hyponatremic.  Acute ischemic stroke  Dispo: The patient is from:SNF Anticipated d/c is to:SNF Anticipated d/c date is: 2 days Patient currently is not medically stable to d/c.        Family Communication:noFamily at bedside  Consultants:renal  Code Status: DNR  DVT Prophylaxis: [place] Lovenox   Procedures: As Listed in Progress Note Above  Antibiotics: Ceftriaxone  7/7>>7/10        Subjective: Patient felt he had some  difficulty using right arm this am, but states now it has improved.  Patient denies fevers, chills, headache, chest pain, dyspnea, nausea, vomiting, diarrhea, abdominal pain, dysuria, hematuria,    Objective: Vitals:   04/07/20 0408 04/07/20 1502 04/07/20 2123 04/08/20 0500  BP: 136/79 121/66 111/70 (!) 142/69  Pulse: 89 86 88 91  Resp:  20 20 20   Temp: 99 F (37.2 C) 98.3 F (36.8 C)  98.9 F (37.2 C)  TempSrc: Oral Oral    SpO2: 100% 98% 96% 99%  Weight: 106.8 kg     Height:        Intake/Output Summary (Last 24 hours) at 04/08/2020 0954 Last data filed at 04/08/2020 0441 Gross per 24 hour  Intake 300 ml  Output 2125 ml  Net -1825 ml   Weight change:  Exam:   General:  Pt is alert, follows commands appropriately, not in acute distress  HEENT: No icterus, No thrush, No neck mass, [place]/AT  Cardiovascular: RRR, S1/S2, no rubs, no gallops  Respiratory: bibasilar crackles. No wheeze  Abdomen: Soft/+BS, non tender, non distended, no guarding  Extremities: No pitting edema, No lymphangitis, No petechiae, No rashes, no synovitis  Neuro:  CN II-XII intact, strength 4-/5 in RUE, RLE, strength 4-/5 LUE, LLE; sensation intact bilateral; no dysmetria; babinski equivocal     Data Reviewed: I have personally reviewed following labs and imaging studies Basic Metabolic Panel: Recent Labs  Lab 04/04/20 0550 04/04/20 1600 04/05/20 0453 04/05/20 0453 04/06/20 0548 04/06/20 1123 04/07/20 0416 04/07/20 0416 04/07/20 0826 04/07/20 1148 04/07/20 1746 04/08/20 0137 04/08/20 0631  NA 122*   < > 120*   < > 119*   < > 120*  121*   < > 122* 122* 124* 124* 126*  126*  K 3.7  --  3.7  --  3.6  --  3.6  --   --   --   --   --  4.0  CL 84*  --  83*  --  83*  --  86*  --   --   --   --   --  91*  CO2 28  --  26  --  25  --  24  --   --   --   --   --  26  GLUCOSE 111*  --  110*  --  104*  --  160*  --   --   --   --    --  114*  BUN 8  --  9  --  12  --  13  --   --   --   --   --  10  CREATININE 0.68  --  0.61  --  0.68  0.68  --  0.73  --   --   --   --   --  0.72  CALCIUM 8.6*  --  8.6*  --  8.7*  --  8.6*  --   --   --   --   --  9.0  MG 1.6*  --  1.7  --  1.5*  --  1.5*  --   --   --   --   --  1.6*  PHOS 4.0  --  4.3  --  4.5  --  3.8  --   --   --   --   --  4.5   < > = values in this interval not displayed.   Liver Function Tests:  Recent Labs  Lab 04/04/20 0550 04/05/20 0453 04/06/20 0548 04/07/20 0416 04/08/20 0631  ALBUMIN 2.5* 2.5* 2.6* 2.6* 2.6*   No results for input(s): LIPASE, AMYLASE in the last 168 hours. No results for input(s): AMMONIA in the last 168 hours. Coagulation Profile: No results for input(s): INR, PROTIME in the last 168 hours. CBC: Recent Labs  Lab 04/02/20 0436 04/03/20 0530 04/04/20 0550 04/06/20 0548  WBC 3.1* 3.9* 4.0 5.0  NEUTROABS 1.5* 1.8 2.1  --   HGB 8.6* 8.3* 8.6* 8.3*  HCT 26.9* 25.5* 26.6* 26.0*  MCV 85.4 84.7 84.4 83.9  PLT 286 282 271 298   Cardiac Enzymes: No results for input(s): CKTOTAL, CKMB, CKMBINDEX, TROPONINI in the last 168 hours. BNP: Invalid input(s): POCBNP CBG: Recent Labs  Lab 04/07/20 0813 04/07/20 1146 04/07/20 1649 04/07/20 2126 04/08/20 0721  GLUCAP 113* 152* 123* 158* 123*   HbA1C: No results for input(s): HGBA1C in the last 72 hours. Urine analysis:    Component Value Date/Time   COLORURINE YELLOW 04/04/2020 1650   APPEARANCEUR CLOUDY (A) 04/04/2020 1650   APPEARANCEUR Clear 05/02/2014 0134   LABSPEC 1.009 04/04/2020 1650   LABSPEC 1.004 05/02/2014 0134   PHURINE 8.0 04/04/2020 1650   GLUCOSEU NEGATIVE 04/04/2020 1650   GLUCOSEU Negative 05/02/2014 0134   HGBUR NEGATIVE 04/04/2020 1650   BILIRUBINUR NEGATIVE 04/04/2020 1650   BILIRUBINUR Negative 05/02/2014 0134   KETONESUR NEGATIVE 04/04/2020 1650   PROTEINUR NEGATIVE 04/04/2020 1650   NITRITE NEGATIVE 04/04/2020 1650   LEUKOCYTESUR NEGATIVE  04/04/2020 1650   LEUKOCYTESUR Negative 05/02/2014 0134   Sepsis Labs: @LABRCNTIP (procalcitonin:4,lacticidven:4) ) Recent Results (from the past 240 hour(s))  SARS Coronavirus 2 by RT PCR (hospital order, performed in [facility] hospital lab) Nasopharyngeal Nasopharyngeal Swab     Status: None   Collection Time: 03/30/20 12:04 PM   Specimen: Nasopharyngeal Swab  Result Value Ref Range Status   SARS Coronavirus 2 NEGATIVE NEGATIVE Final    Comment: (NOTE) SARS-CoV-2 target nucleic acids are NOT DETECTED.  The SARS-CoV-2 RNA is generally detectable in upper and lower respiratory specimens during the acute phase of infection. The lowest concentration of SARS-CoV-2 viral copies this assay can detect is 250 copies / mL. A negative result does not preclude SARS-CoV-2 infection and should not be used as the sole basis for treatment or other patient management decisions.  A negative result may occur with improper specimen collection / handling, submission of specimen other than nasopharyngeal swab, presence of viral mutation(s) within the areas targeted by this assay, and inadequate number of viral copies (<250 copies / mL). A negative result must be combined with clinical observations, patient history, and epidemiological information.  Fact Sheet for Patients:   05/31/20  Fact Sheet for Healthcare Providers: BoilerBrush.com.cy  This test is not yet approved or  cleared by the https://pope.com/ FDA and has been authorized for detection and/or diagnosis of SARS-CoV-2 by FDA under an Emergency Use Authorization (EUA).  This EUA will remain in effect (meaning this test can be used) for the duration of the COVID-19 declaration under Section 564(b)(1) of the Act, 21 U.S.C. section 360bbb-3(b)(1), unless the authorization is terminated or revoked sooner.  Performed at [facility], [address] [place], [place]   Culture,  Urine     Status: Abnormal   Collection Time: 03/30/20 12:11 PM   Specimen: Urine, Random  Result Value Ref Range Status   Specimen Description   Final    URINE, RANDOM Performed at [facility]  [facility], [address], [place], [place] [address]    Special Requests   Final    NONE Performed at [facility], [address], [place], [place] [address]    Culture >=100,000 COLONIES/mL ESCHERICHIA COLI (A)  Final   Report Status 04/01/2020 FINAL  Final   Organism ID, Bacteria ESCHERICHIA COLI (A)  Final      Susceptibility   Escherichia coli - MIC*    AMPICILLIN 4 SENSITIVE Sensitive     CEFAZOLIN <=4 SENSITIVE Sensitive     CEFTRIAXONE <=0.25 SENSITIVE Sensitive     CIPROFLOXACIN <=0.25 SENSITIVE Sensitive     GENTAMICIN <=1 SENSITIVE Sensitive     IMIPENEM <=0.25 SENSITIVE Sensitive     NITROFURANTOIN 32 SENSITIVE Sensitive     TRIMETH/SULFA <=20 SENSITIVE Sensitive     AMPICILLIN/SULBACTAM <=2 SENSITIVE Sensitive     PIP/TAZO <=4 SENSITIVE Sensitive     * >=100,000 COLONIES/mL ESCHERICHIA COLI  MRSA PCR Screening     Status: None   Collection Time: 03/30/20 12:35 PM   Specimen: Nasal Mucosa; Nasopharyngeal  Result Value Ref Range Status   MRSA by PCR NEGATIVE NEGATIVE Final    Comment:        The GeneXpert MRSA Assay (FDA approved for NASAL specimens only), is one component of a comprehensive MRSA colonization surveillance program. It is not intended to diagnose MRSA infection nor to guide or monitor treatment for MRSA infections. Performed at [facility], [address], [place], [place] [address]   Culture, blood (Routine X 2) w Reflex to ID Panel     Status: None   Collection Time: 03/31/20  5:20 PM   Specimen: BLOOD  Result Value Ref Range Status   Specimen Description BLOOD LEFT ANTECUBITAL  Final   Special Requests   Final    BOTTLES DRAWN AEROBIC AND ANAEROBIC Blood Culture adequate volume   Culture   Final    NO GROWTH 5 DAYS Performed at [facility], [address], [place], [place] [address]    Report Status 04/05/2020 FINAL  Final  Culture, blood (Routine X 2) w Reflex to ID Panel     Status: None   Collection Time: 03/31/20  5:28 PM   Specimen: BLOOD  Result Value Ref Range Status   Specimen Description BLOOD BLOOD RIGHT WRIST  Final   Special Requests   Final    AEROBIC BOTTLE ONLY Blood Culture results may not be optimal due to an inadequate volume of blood received in culture bottles   Culture   Final    NO GROWTH 5 DAYS Performed at [facility], [address], [place], [place] [address]    Report Status 04/05/2020 FINAL  Final     Scheduled Meds: . aspirin EC  81 mg Oral Daily  . atorvastatin  20 mg Oral q1800  . carvedilol  25 mg Oral BID WC  . Chlorhexidine Gluconate Cloth  6 each Topical Daily  . cholecalciferol  2,000 Units Oral q morning - 10a  . enoxaparin (LOVENOX) injection  55 mg Subcutaneous Q24H  . folic acid  1 mg Oral Daily  . magnesium oxide  800 mg Oral BID  . pantoprazole  40 mg Oral Daily  . phenytoin  200 mg Oral Daily  . phenytoin  300 mg Oral QHS  . Ensure Max Protein  11 oz Oral BID  . senna-docusate  2 tablet Oral QHS  . sodium chloride flush  3 mL Intravenous Q12H  . tamsulosin  0.4  mg Oral QPC supper  . thiamine  100 mg Oral Daily  . tolvaptan  15 mg Oral Once   Continuous Infusions:  Procedures/Studies: CT Head Wo Contrast  Result Date: 03/30/2020 CLINICAL DATA:  Encephalopathy. Additional provided: Elevated sodium levels, lethargic. EXAM: CT HEAD WITHOUT CONTRAST TECHNIQUE: Contiguous axial images were obtained from the base of the skull through the vertex without intravenous contrast. COMPARISON:  Head CT examination 03/03/2020 and earlier FINDINGS: Brain: The examination is mild to moderately motion degraded. Stable, mild generalized parenchymal atrophy. 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 Redemonstrated chronic infarct within the right external  capsule. Unchanged moderate patchy hypodensity within the cerebral white matter which is nonspecific, but consistent with chronic small vessel ischemic disease. There is no acute intracranial hemorrhage. No demarcated cortical infarct is identified. No extra-axial fluid collection. No evidence of intracranial mass. No midline shift. Incidentally noted cavum septum pellucidum and cavum vergae. Vascular: No hyperdense vessel.  Atherosclerotic calcifications. Skull: Normal. Negative for fracture or focal lesion. Sinuses/Orbits: Visualized orbits show no acute finding. Mild ethmoid sinus mucosal thickening. Moderate right maxillary sinus mucosal thickening. No significant mastoid effusion. IMPRESSION: Mild-to-moderate motion degradation. No evidence of acute intracranial abnormality. Stable generalized parenchymal atrophy and chronic small vessel ischemic disease. Paranasal sinus mucosal thickening as described, most notably right maxillary. Electronically Signed   By: Jackey LogeKyle  Golden DO   On: 03/30/2020 10:34   DG CHEST PORT 1 VIEW  Result Date: 04/01/2020 CLINICAL DATA:  Fever. EXAM: PORTABLE CHEST 1 VIEW COMPARISON:  03/30/2020. FINDINGS: Mediastinum and hilar structures normal. Heart size stable. Low lung volumes. Mild left base atelectasis/infiltrate. No pleural effusion or pneumothorax. No acute bony abnormality. IMPRESSION: Low lung volumes with mild left base atelectasis/infiltrate. Similar findings noted on prior exam. Electronically Signed   By: Maisie Fushomas  Register   On: 04/01/2020 05:27   DG Chest Portable 1 View  Result Date: 03/30/2020 CLINICAL DATA:  Altered mental status. EXAM: PORTABLE CHEST 1 VIEW COMPARISON:  Prior chest radiograph 03/02/2020 FINDINGS: Mild cardiomegaly, unchanged. Minimal left basilar atelectasis. No appreciable airspace consolidation or frank pulmonary edema. No evidence of pleural effusion or pneumothorax. No acute bony abnormality identified. IMPRESSION: Minimal left basilar  atelectasis.  Lungs otherwise clear. Mild cardiomegaly, unchanged. Electronically Signed   By: Jackey LogeKyle  Golden DO   On: 03/30/2020 10:36    Catarina Hartshornavid Dalaysia Harms, DO  [facility]  If 7PM-7AM, please contact night-coverage [email or web address] Password [email or web address] 04/08/2020, 9:54 AM   LOS: 9 days

## 2020-04-08 NOTE — TOC Progression Note (Signed)
Transition of Care [facility]) - Progression Note    Patient Details  Name: Brad Graham MRN: [id number] Date of Birth: 1961-07-09  Transition of Care [facility]) CM/SW Contact  Elliot Gault, LCSW Phone Number: 04/08/2020, 10:41 AM  Clinical Narrative:     TOC following. Pt status discussed with MD in Progression. Per MD, pt's sodium remains an issue and there is only a small likelihood pt would be stable for dc over the weekend. TOC spoke with Melissa at [facility] to update. Per Melissa, if pt is stable for dc over the weekend, they can accept him back. Efraim Kaufmann is the contact at [phone].  TOC will follow.   Expected Discharge Plan: Long Term Nursing Home Barriers to Discharge: Continued Medical Work up  Expected Discharge Plan and Services Expected Discharge Plan: Long Term Nursing Home In-house Referral: Clinical Social Work   Post Acute Care Choice: Resumption of Svcs/PTA Provider Living arrangements for the past 2 months: Skilled Nursing Facility                                       Social Determinants of Health (SDOH) Interventions    Readmission Risk Interventions Readmission Risk Prevention Plan 03/31/2020  Transportation Screening Complete  Medication Review (RN CM) Complete  Some recent data might be hidden

## 2020-04-09 ENCOUNTER — Inpatient Hospital Stay (HOSPITAL_COMMUNITY): Payer: Medicaid Other

## 2020-04-09 ENCOUNTER — Other Ambulatory Visit: Payer: Self-pay

## 2020-04-09 DIAGNOSIS — I639 Cerebral infarction, unspecified: Secondary | ICD-10-CM

## 2020-04-09 DIAGNOSIS — E871 Hypo-osmolality and hyponatremia: Principal | ICD-10-CM

## 2020-04-09 DIAGNOSIS — I361 Nonrheumatic tricuspid (valve) insufficiency: Secondary | ICD-10-CM

## 2020-04-09 DIAGNOSIS — R569 Unspecified convulsions: Secondary | ICD-10-CM

## 2020-04-09 LAB — GLUCOSE, CAPILLARY
Glucose-Capillary: 120 mg/dL — ABNORMAL HIGH (ref 70–99)
Glucose-Capillary: 168 mg/dL — ABNORMAL HIGH (ref 70–99)
Glucose-Capillary: 121 mg/dL — ABNORMAL HIGH (ref 70–99)
Glucose-Capillary: 104 mg/dL — ABNORMAL HIGH (ref 70–99)

## 2020-04-09 LAB — RENAL FUNCTION PANEL
Albumin: 2.8 g/dL — ABNORMAL LOW (ref 3.5–5.0)
Anion gap: 10 (ref 5–15)
BUN: 15 mg/dL (ref 8–23)
CO2: 25 mmol/L (ref 22–32)
Calcium: 9.2 mg/dL (ref 8.9–10.3)
Chloride: 92 mmol/L — ABNORMAL LOW (ref 98–111)
Creatinine, Ser: 0.72 mg/dL (ref 0.61–1.24)
GFR calc Af Amer: >60 mL/min
GFR calc non Af Amer: >60 mL/min
Glucose, Bld: 152 mg/dL — ABNORMAL HIGH (ref 70–99)
Phosphorus: 4.9 mg/dL — ABNORMAL HIGH (ref 2.5–4.6)
Potassium: 3.8 mmol/L (ref 3.5–5.1)
Sodium: 127 mmol/L — ABNORMAL LOW (ref 135–145)

## 2020-04-09 LAB — LIPID PANEL
Cholesterol: 149 mg/dL (ref 0–200)
HDL: 44 mg/dL
LDL Cholesterol: 82 mg/dL (ref 0–99)
Total CHOL/HDL Ratio: 3.4 ratio
Triglycerides: 114 mg/dL
VLDL: 23 mg/dL (ref 0–40)

## 2020-04-09 LAB — ECHOCARDIOGRAM COMPLETE
Area-P 1/2: 2.73 cm2
Height: 72 in
S' Lateral: 3.04 cm
Weight: 3717.84 oz

## 2020-04-09 LAB — SODIUM: Sodium: 130 mmol/L — ABNORMAL LOW (ref 135–145)

## 2020-04-09 LAB — HEMOGLOBIN A1C
Hgb A1c MFr Bld: 7.1 % — ABNORMAL HIGH (ref 4.8–5.6)
Mean Plasma Glucose: 157.07 mg/dL

## 2020-04-09 MED ORDER — INSULIN ASPART 100 UNIT/ML ~~LOC~~ SOLN
0.0000 [IU] | Freq: Three times a day (TID) | SUBCUTANEOUS | Status: DC
  Administered 2020-04-10 (×2): 1 [IU] via SUBCUTANEOUS
  Administered 2020-04-11: 2 [IU] via SUBCUTANEOUS
  Administered 2020-04-13: 1 [IU] via SUBCUTANEOUS

## 2020-04-09 MED ORDER — TOLVAPTAN 15 MG PO TABS
15.0000 mg | ORAL_TABLET | Freq: Once | ORAL | Status: DC
  Filled 2020-04-09: qty 1

## 2020-04-09 NOTE — Progress Notes (Addendum)
Care link was called and report given. Attempted to call report to [facility] for patient transfer. Floor refused patient due to patient medical status. Bed placement notified.

## 2020-04-09 NOTE — Progress Notes (Signed)
  Echocardiogram 2D Echocardiogram has been performed.  Celene Skeen 04/09/2020, 9:05 AM

## 2020-04-09 NOTE — Progress Notes (Addendum)
[facility]                                                                              Patient Demographics  Brad Graham, is a 59 y.o. male, DOB - [birth year], ZOX:[id number]  Admit date - 03/30/2020   Admitting Physician [name], MD  Outpatient Primary MD for the patient is [name], NP  Outpatient specialists:   LOS - 10  days   Medical records reviewed and are as summarized below:    Chief Complaint  Patient presents with  . Altered Mental Status       Brief summary   58 y.o.malewith medical history significantfor a recent hospitalization at [facility](6/9[facility]6/[facility])for cardiac arrest requiring intubation acute renal failure aspiration pneumonia who was treated in the ICU for several days and subsequently extubated and eventually discharged to [facility]. 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 He has a chronic seizure disorder and is maintained on Dilantin. He has type 2 diabetes mellitus and hypertension.  He was admitted with acute mental status changes and severe hyponatremia with sodium of 109.His sodium levels were being monitored at the SNF reportedly and they had recommended giving him IV fluids however the patient refused and he was given oral sodium tablets. He had been taking those reportedly during the last week.  Unfortunately his mentation continued to deteriorate and hewas sent to ED for evaluation.At baseline he is able to care for himself for the most part and normally is able to vocalize his needs. Over the last couple of days he has been mostly nonvocal confused and this appears to be outside of his baseline.Renal was consulted for assistance. As his Na slowly improved, so did his mentation. In am of 04/08/20, patient woke up with some right arm  weakness.  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  He is being [name] for neurology evaluation.  Nephrology was also notified of transfer.  7/17: Awaiting for transfer to [facility]   Assessment & Plan     Principal problem Hyponatremia -Initially improved with IV Lasix, then trended down again.  Nephrology consulted -Negative balance of 24 L, IV fluids discontinued per renal -Received tolvaptan 7.5 mg on 7/15, 15 mg on 7/16, nephrology to redose 15 mg on 7/17 today -Sodium trending up  Active problems Acute on chronic diastolic CHF -2D echo showed EF of 70-75% with mild LVH, indeterminate diastolic filling -Responded to IV Lasix, has continued to diurese without Lasix, last dose on 7/11 -Fluid management per renal, negative balance of 24 L  Right hemiparesis/acute ischemic stroke -Patient was noted to have some right-sided weakness during 04/09/2019 1 AM assessment. -MRI brain was obtained which showed acute infarct in the left frontal gyrus. -Stroke work-up initiated, Dr. [name] discussed with neurology, recommended transfer to [facility], currently awaiting bed -Follow 2D echo -Hemoglobin A1c 7.1, lipid panel showed LDL 82, goal less than 70 -Pending CT angiogram head and neck -Patient on aspirin 81 mg  daily PTA, pending stroke team recommendations regarding Plavix -Continue statin, on Lipitor 20 mg daily PTA, increased to 80 mg daily  Addendum 12:45 PM Rapid response called for pinpoint pupils, lethargy, tongue deviation -On examination, lethargic but easily arousable and close to his baseline, still has right-sided hemiparesis -Pinpoint pupils noted, had only received Thorazine but no narcotics -On aspirin 81 mg daily, however due to recent stroke, will repeat CT head stat to rule out any hemorrhagic conversion, needs neurology evaluation ASAP, awaiting transfer to [facility] Addendum CT head stable, no hemorrhagic conversion  E. coli UTI Had 4  days of IV ceftriaxone, last dose on 04/02/2020  Acute metabolic encephalopathy -Likely due to UTI, hyponatremia, overall improved, near his baseline   History of seizure disorder Continue Dilantin  History of alcohol use Abstinent for past several weeks, resident at [facility]  Diabetes mellitus type 2 -Hemoglobin A1c 7.1 -Placed on sliding scale insulin, carb modified diet   Essential hypertension -Continue Coreg Amlodipine, hydralazine on hold   Anemia of chronic disease H&H currently stable  Obesity Estimated body mass index is 31.51 kg/m as calculated from the following:   Height as of this encounter: 6' (1.829 m).   Weight as of this encounter: 105.4 kg.  Code Status: Full CODE STATUS DVT Prophylaxis:  Lovenox  Family Communication: Discussed all imaging results, lab results, explained to the patient    Disposition Plan:     Status is: Inpatient  Remains inpatient appropriate because:Inpatient level of care appropriate due to severity of illness   Dispo: The patient is from: Home              Anticipated d/c is to: TBD              Anticipated d/c date is: 2 days              Patient currently is not medically stable to d/c.      Time Spent in minutes      Procedures:  None  Consultants:   Nephrology Neurology  Antimicrobials:   Anti-infectives (From admission, onward)   Start     Dose/Rate Route Frequency Ordered Stop   03/30/20 1830  cefTRIAXone (ROCEPHIN) 1 g in sodium chloride 0.9 % 100 mL IVPB        1 g 200 mL/hr over 30 Minutes Intravenous Every 24 hours 03/30/20 1800 04/02/20 1808          Medications  Scheduled Meds: . aspirin EC  81 mg Oral Daily  . atorvastatin  80 mg Oral q1800  . carvedilol  25 mg Oral BID WC  . Chlorhexidine Gluconate Cloth  6 each Topical Daily  . cholecalciferol  2,000 Units Oral q morning - 10a  . enoxaparin (LOVENOX) injection  55 mg Subcutaneous Q24H  . folic acid  1 mg Oral Daily    . magnesium oxide  800 mg Oral BID  . pantoprazole  40 mg Oral Daily  . phenytoin  200 mg Oral Daily  . phenytoin  300 mg Oral QHS  . Ensure Max Protein  11 oz Oral BID  . senna-docusate  2 tablet Oral QHS  . sodium chloride flush  3 mL Intravenous Q12H  . tamsulosin  0.4 mg Oral QPC supper  . thiamine  100 mg Oral Daily  . tolvaptan  15 mg Oral Once   Continuous Infusions: PRN Meds:.acetaminophen **OR** acetaminophen, bisacodyl, chlorproMAZINE, ondansetron **OR** ondansetron (ZOFRAN) IV  Subjective:   Haedyn Ancrum was seen and examined today.  No acute complaints this morning.  Still has right-sided weakness. Patient denies dizziness, chest pain, shortness of breath, abdominal pain, N/V/D/C.  Objective:   Vitals:   04/08/20 1340 04/08/20 1700 04/08/20 2133 04/09/20 0639  BP: 120/73 (!) 153/90 140/83 126/84  Pulse: 91 89 89 96  Resp: 19 18 17 18   Temp: 99.3 F (37.4 C) 98.4 F (36.9 C) 97.9 F (36.6 C) 98.3 F (36.8 C)  TempSrc: Oral Oral Oral Oral  SpO2: 98% 97% 100% 95%  Weight:    105.4 kg  Height:        Intake/Output Summary (Last 24 hours) at 04/09/2020 1153 Last data filed at 04/09/2020 0900 Gross per 24 hour  Intake 720 ml  Output 3750 ml  Net -3030 ml     Wt Readings from Last 3 Encounters:  04/09/20 105.4 kg  03/05/20 119.5 kg  02/16/20 107.7 kg     Exam  General: Alert and oriented x 3, NAD  Cardiovascular: S1 S2 auscultated, no murmurs, RRR  Respiratory: Decreased breath sound at the bases  Gastrointestinal: Soft, nontender, nondistended, + bowel sounds  Ext: no pedal edema bilaterally  Neuro: 4/5 strength in RUE, LUE.  4/5 RLE, 5/5L LE  Musculoskeletal: No digital cyanosis, clubbing  Skin: No rashes  Psych: Normal affect and demeanor, alert and oriented x3    Data Reviewed:  I have personally reviewed following labs and imaging studies  Micro Results Recent Results (from the past 240 hour(s))  SARS Coronavirus 2 by RT  PCR (hospital order, performed in [facility] hospital lab) Nasopharyngeal Nasopharyngeal Swab     Status: None   Collection Time: 03/30/20 12:04 PM   Specimen: Nasopharyngeal Swab  Result Value Ref Range Status   SARS Coronavirus 2 NEGATIVE NEGATIVE Final    Comment: (NOTE) SARS-CoV-2 target nucleic acids are NOT DETECTED.  The SARS-CoV-2 RNA is generally detectable in upper and lower respiratory specimens during the acute phase of infection. The lowest concentration of SARS-CoV-2 viral copies this assay can detect is 250 copies / mL. A negative result does not preclude SARS-CoV-2 infection and should not be used as the sole basis for treatment or other patient management decisions.  A negative result may occur with improper specimen collection / handling, submission of specimen other than nasopharyngeal swab, presence of viral mutation(s) within the areas targeted by this assay, and inadequate number of viral copies (<250 copies / mL). A negative result must be combined with clinical observations, patient history, and epidemiological information.  Fact Sheet for Patients:   05/31/20  Fact Sheet for Healthcare Providers: BoilerBrush.com.cy  This test is not yet approved or  cleared by the https://pope.com/ FDA and has been authorized for detection and/or diagnosis of SARS-CoV-2 by FDA under an Emergency Use Authorization (EUA).  This EUA will remain in effect (meaning this test can be used) for the duration of the COVID-19 declaration under Section 564(b)(1) of the Act, 21 U.S.C. section 360bbb-3(b)(1), unless the authorization is terminated or revoked sooner.  Performed at [facility], [address] [place], [place]   Culture, Urine     Status: Abnormal   Collection Time: 03/30/20 12:11 PM   Specimen: Urine, Random  Result Value Ref Range Status   Specimen Description   Final    URINE, RANDOM Performed at  [facility], [address], [place], [place]    Special Requests   Final  NONE Performed at [facility], [address], [place], [place] [address]    Culture >=100,000 COLONIES/mL ESCHERICHIA COLI (A)  Final   Report Status 04/01/2020 FINAL  Final   Organism ID, Bacteria ESCHERICHIA COLI (A)  Final      Susceptibility   Escherichia coli - MIC*    AMPICILLIN 4 SENSITIVE Sensitive     CEFAZOLIN <=4 SENSITIVE Sensitive     CEFTRIAXONE <=0.25 SENSITIVE Sensitive     CIPROFLOXACIN <=0.25 SENSITIVE Sensitive     GENTAMICIN <=1 SENSITIVE Sensitive     IMIPENEM <=0.25 SENSITIVE Sensitive     NITROFURANTOIN 32 SENSITIVE Sensitive     TRIMETH/SULFA <=20 SENSITIVE Sensitive     AMPICILLIN/SULBACTAM <=2 SENSITIVE Sensitive     PIP/TAZO <=4 SENSITIVE Sensitive     * >=100,000 COLONIES/mL ESCHERICHIA COLI  MRSA PCR Screening     Status: None   Collection Time: 03/30/20 12:35 PM   Specimen: Nasal Mucosa; Nasopharyngeal  Result Value Ref Range Status   MRSA by PCR NEGATIVE NEGATIVE Final    Comment:        The GeneXpert MRSA Assay (FDA approved for NASAL specimens only), is one component of a comprehensive MRSA colonization surveillance program. It is not intended to diagnose MRSA infection nor to guide or monitor treatment for MRSA infections. Performed at [facility], [address], [place], [place] [address]   Culture, blood (Routine X 2) w Reflex to ID Panel     Status: None   Collection Time: 03/31/20  5:20 PM   Specimen: BLOOD  Result Value Ref Range Status   Specimen Description BLOOD LEFT ANTECUBITAL  Final   Special Requests   Final    BOTTLES DRAWN AEROBIC AND ANAEROBIC Blood Culture adequate volume   Culture   Final    NO GROWTH 5 DAYS Performed at [facility], [address] [place], [place] [address]    Report Status 04/05/2020 FINAL  Final  Culture, blood (Routine X 2) w Reflex to ID Panel     Status: None   Collection Time: 03/31/20  5:28 PM    Specimen: BLOOD  Result Value Ref Range Status   Specimen Description BLOOD BLOOD RIGHT WRIST  Final   Special Requests   Final    AEROBIC BOTTLE ONLY Blood Culture results may not be optimal due to an inadequate volume of blood received in culture bottles   Culture   Final    NO GROWTH 5 DAYS Performed at [facility], [address], [place], [place] [address]    Report Status 04/05/2020 FINAL  Final  SARS Coronavirus 2 by RT PCR (hospital order, performed in [facility] hospital lab) Nasopharyngeal Nasopharyngeal Swab     Status: None   Collection Time: 04/08/20  7:30 PM   Specimen: Nasopharyngeal Swab  Result Value Ref Range Status   SARS Coronavirus 2 NEGATIVE NEGATIVE Final    Comment: (NOTE) SARS-CoV-2 target nucleic acids are NOT DETECTED.  The SARS-CoV-2 RNA is generally detectable in upper and lower respiratory specimens during the acute phase of infection. The lowest concentration of SARS-CoV-2 viral copies this assay can detect is 250 copies / mL. A negative result does not preclude SARS-CoV-2 infection and should not be used as the sole basis for treatment or other patient management decisions.  A negative result may occur with improper specimen collection / handling, submission of specimen other than nasopharyngeal swab, presence of viral mutation(s) within the areas targeted by this assay, and inadequate number of  viral copies (<250 copies / mL). A negative result must be combined with clinical observations, patient history, and epidemiological information.  Fact Sheet for Patients:   BoilerBrush.com.cy  Fact Sheet for Healthcare Providers: https://pope.com/  This test is not yet approved or  cleared by the [place] FDA and has been authorized for detection and/or diagnosis of SARS-CoV-2 by FDA under an Emergency Use Authorization (EUA).  This EUA will remain in effect (meaning this test can be used) for the  duration of the COVID-19 declaration under Section 564(b)(1) of the Act, 21 U.S.C. section 360bbb-3(b)(1), unless the authorization is terminated or revoked sooner.  Performed at [facility], [address] [place], [place] [address]     Radiology Reports CT Head Wo Contrast  Result Date: 03/30/2020 CLINICAL DATA:  Encephalopathy. Additional provided: Elevated sodium levels, lethargic. EXAM: CT HEAD WITHOUT CONTRAST TECHNIQUE: Contiguous axial images were obtained from the base of the skull through the vertex without intravenous contrast. COMPARISON:  Head CT examination 03/03/2020 and earlier FINDINGS: Brain: The examination is mild to moderately motion degraded. Stable, mild generalized parenchymal atrophy. 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 Redemonstrated chronic infarct within the right external capsule. Unchanged moderate patchy hypodensity within the cerebral white matter which is nonspecific, but consistent with chronic small vessel ischemic disease. There is no acute intracranial hemorrhage. No demarcated cortical infarct is identified. No extra-axial fluid collection. No evidence of intracranial mass. No midline shift. Incidentally noted cavum septum pellucidum and cavum vergae. Vascular: No hyperdense vessel.  Atherosclerotic calcifications. Skull: Normal. Negative for fracture or focal lesion. Sinuses/Orbits: Visualized orbits show no acute finding. Mild ethmoid sinus mucosal thickening. Moderate right maxillary sinus mucosal thickening. No significant mastoid effusion. IMPRESSION: Mild-to-moderate motion degradation. No evidence of acute intracranial abnormality. Stable generalized parenchymal atrophy and chronic small vessel ischemic disease. Paranasal sinus mucosal thickening as described, most notably right maxillary. Electronically Signed   By: Jackey Loge DO   On: 03/30/2020 10:34   MR ANGIO HEAD WO CONTRAST  Result Date:  04/08/2020 CLINICAL DATA:  Right arm weakness EXAM: MRI HEAD WITHOUT CONTRAST MRA HEAD WITHOUT CONTRAST TECHNIQUE: Multiplanar, multiecho pulse sequences of the brain and surrounding structures were obtained without intravenous contrast. Angiographic images of the head were obtained using MRA technique without contrast. COMPARISON:  2015 FINDINGS: MRI HEAD Motion artifact is present. Brain: A punctate focus of cortical reduced diffusion is present along the left superior frontal gyrus. Prominence of the ventricles and sulci reflects generalized parenchymal volume loss, which appears progressed since 2015. Patchy and confluent areas of T2 hyperintensity in the supratentorial and pontine white matter are nonspecific but probably reflect moderate chronic microvascular ischemic changes. 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 Additional chronic small vessel infarct of the left centrum semiovale. A few foci of susceptibility along the left basal ganglia likely reflect chronic microhemorrhages. There is no intracranial mass, mass effect, or edema. There is no hydrocephalus or extra-axial fluid collection. Vascular: Major vessel flow voids at the skull base are preserved. Skull and upper cervical spine: Normal marrow signal is preserved. Sinuses/Orbits: Paranasal sinuses are aerated. Orbits are unremarkable. Other: Sella is unremarkable.  Mastoid air cells are clear. MRA HEAD Motion artifact is present. Intracranial internal carotid arteries are patent with atherosclerotic irregularity and potentially moderate to marked stenosis in the paraclinoid regions. Proximal middle and anterior cerebral arteries are patent. Intracranial vertebral arteries, basilar artery, proximal posterior cerebral  arteries are patent. IMPRESSION: Motion degraded. Punctate acute cortical infarct of the left superior frontal gyrus. Moderate chronic microvascular ischemic changes. Small  chronic infarcts and microhemorrhages as described. No proximal intracranial vessel occlusion. Potential moderate to marked stenosis of the paraclinoid internal carotid arteries bilaterally, which could be overestimated due to artifact. Electronically Signed   By: Guadlupe Spanish M.D.   On: 04/08/2020 16:03   MR BRAIN WO CONTRAST  Result Date: 04/08/2020 CLINICAL DATA:  Right arm weakness EXAM: MRI HEAD WITHOUT CONTRAST MRA HEAD WITHOUT CONTRAST TECHNIQUE: Multiplanar, multiecho pulse sequences of the brain and surrounding structures were obtained without intravenous contrast. Angiographic images of the head were obtained using MRA technique without contrast. COMPARISON:  2015 FINDINGS: MRI HEAD Motion artifact is present. Brain: A punctate focus of cortical reduced diffusion is present along the left superior frontal gyrus. Prominence of the ventricles and sulci reflects generalized parenchymal volume loss, which appears progressed since 2015. Patchy and confluent areas of T2 hyperintensity in the supratentorial and pontine white matter are nonspecific but probably reflect moderate chronic microvascular ischemic changes. 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 Additional chronic small vessel infarct of the left centrum semiovale. A few foci of susceptibility along the left basal ganglia likely reflect chronic microhemorrhages. There is no intracranial mass, mass effect, or edema. There is no hydrocephalus or extra-axial fluid collection. Vascular: Major vessel flow voids at the skull base are preserved. Skull and upper cervical spine: Normal marrow signal is preserved. Sinuses/Orbits: Paranasal sinuses are aerated. Orbits are unremarkable. Other: Sella is unremarkable.  Mastoid air cells are clear. MRA HEAD Motion artifact is present. Intracranial internal carotid arteries are patent with atherosclerotic irregularity and potentially moderate to marked  stenosis in the paraclinoid regions. Proximal middle and anterior cerebral arteries are patent. Intracranial vertebral arteries, basilar artery, proximal posterior cerebral arteries are patent. IMPRESSION: Motion degraded. Punctate acute cortical infarct of the left superior frontal gyrus. Moderate chronic microvascular ischemic changes. Small chronic infarcts and microhemorrhages as described. No proximal intracranial vessel occlusion. Potential moderate to marked stenosis of the paraclinoid internal carotid arteries bilaterally, which could be overestimated due to artifact. Electronically Signed   By: Guadlupe Spanish M.D.   On: 04/08/2020 16:03   DG CHEST PORT 1 VIEW  Result Date: 04/01/2020 CLINICAL DATA:  Fever. EXAM: PORTABLE CHEST 1 VIEW COMPARISON:  03/30/2020. FINDINGS: Mediastinum and hilar structures normal. Heart size stable. Low lung volumes. Mild left base atelectasis/infiltrate. No pleural effusion or pneumothorax. No acute bony abnormality. IMPRESSION: Low lung volumes with mild left base atelectasis/infiltrate. Similar findings noted on prior exam. Electronically Signed   By: Maisie Fus  Register   On: 04/01/2020 05:27   DG Chest Portable 1 View  Result Date: 03/30/2020 CLINICAL DATA:  Altered mental status. EXAM: PORTABLE CHEST 1 VIEW COMPARISON:  Prior chest radiograph 03/02/2020 FINDINGS: Mild cardiomegaly, unchanged. Minimal left basilar atelectasis. No appreciable airspace consolidation or frank pulmonary edema. No evidence of pleural effusion or pneumothorax. No acute bony abnormality identified. IMPRESSION: Minimal left basilar atelectasis.  Lungs otherwise clear. Mild cardiomegaly, unchanged. Electronically Signed   By: Jackey Loge DO   On: 03/30/2020 10:36   ECHOCARDIOGRAM COMPLETE  Result Date: 04/09/2020    ECHOCARDIOGRAM REPORT   Patient Name:   Marcha Dutton Date of Exam: 04/09/2020 Medical Rec #:  [id number]        Height:       72.0 in Accession #:    [id number]  Weight:        232.4 lb Date of Birth:  10-17-1960         BSA:          2.271 m Patient Age:    59 years         BP:           126/84 mmHg Patient Gender: M                HR:           94 bpm. [facility] Procedure: 2D Echo Indications:    stroke 434.91  History:        Patient has prior history of Echocardiogram examinations, most                 recent 03/05/2020. Risk Factors:Hypertension, Diabetes and                 Dyslipidemia. CVA. ETOH Abuse.  Sonographer:    Celene Skeen RDCS (AE) Referring Phys: [phone] DAVID TAT  Sonographer Comments: Image acquisition challenging due to respiratory motion. see comments IMPRESSIONS  1. Left ventricular ejection fraction, by estimation, is 70 to 75%. The left ventricle has normal function. The left ventricle has no regional wall motion abnormalities. There is mild left ventricular hypertrophy. Indeterminate diastolic filling due to E-A fusion.  2. Right ventricular systolic function is normal. The right ventricular size is mildly enlarged.  3. The mitral valve is abnormal. Trivial mitral valve regurgitation.  4. The aortic valve is grossly normal. Aortic valve regurgitation is not visualized.  5. The inferior vena cava is normal in size with greater than 50% respiratory variability, suggesting right atrial pressure of 3 mmHg. Comparison(s): No significant change from prior study. FINDINGS  Left Ventricle: Left ventricular ejection fraction, by estimation, is 70 to 75%. The left ventricle has normal function. The left ventricle has no regional wall motion abnormalities. The left ventricular internal cavity size was normal in size. There is  mild left ventricular hypertrophy. Indeterminate diastolic filling due to E-A fusion. Right Ventricle: The right ventricular size is mildly enlarged. Right vetricular wall thickness was not assessed. Right ventricular systolic function is normal. Left Atrium: Left atrial size was normal in size. Right Atrium: Right atrial size was  normal in size. Pericardium: There is no evidence of pericardial effusion. Mitral Valve: The mitral valve is abnormal. There is mild thickening of the mitral valve leaflet(s). Mild mitral annular calcification. Trivial mitral valve regurgitation. Tricuspid Valve: The tricuspid valve is normal in structure. Tricuspid valve regurgitation is mild. Aortic Valve: The aortic valve is grossly normal. Aortic valve regurgitation is not visualized. Pulmonic Valve: The pulmonic valve was not well visualized. Pulmonic valve regurgitation is trivial. Aorta: The aortic root is normal in size and structure. Venous: The inferior vena cava is normal in size with greater than 50% respiratory variability, suggesting right atrial pressure of 3 mmHg. IAS/Shunts: The interatrial septum was not well visualized.  LEFT VENTRICLE PLAX 2D LVIDd:         4.27 cm  Diastology LVIDs:         3.04 cm  LV e' medial:   8.05 cm/s LV PW:         1.02 cm  LV E/e' medial: 7.9 LV IVS:        1.21 cm LVOT diam:     2.20 cm LV SV:         50 LV SV Index:  22 LVOT Area:     3.80 cm  LEFT ATRIUM             Index       RIGHT ATRIUM           Index LA diam:        3.00 cm 1.32 cm/m  RA Area:     16.70 cm LA Vol (A2C):   37.4 ml 16.47 ml/m RA Volume:   38.60 ml  17.00 ml/m LA Vol (A4C):   62.5 ml 27.52 ml/m LA Biplane Vol: 50.5 ml 22.24 ml/m  AORTIC VALVE LVOT Vmax:   77.70 cm/s LVOT Vmean:  55.100 cm/s LVOT VTI:    0.132 m  AORTA Ao Root diam: 3.20 cm MITRAL VALVE MV Area (PHT): 2.73 cm    SHUNTS MV Decel Time: 278 msec    Systemic VTI:  0.13 m MV E velocity: 63.40 cm/s  Systemic Diam: 2.20 cm MV A velocity: 64.30 cm/s MV E/A ratio:  0.99 Dietrich PatesPaula Ross MD Electronically signed by Dietrich PatesPaula Ross MD Signature Date/Time: 04/09/2020/10:16:54 AM    Final     Lab Data:  CBC: Recent Labs  Lab 04/03/20 0530 04/04/20 0550 04/06/20 0548  WBC 3.9* 4.0 5.0  NEUTROABS 1.8 2.1  --   HGB 8.3* 8.6* 8.3*  HCT 25.5* 26.6* 26.0*  MCV 84.7 84.4 83.9  PLT 282 271  298   Basic Metabolic Panel: Recent Labs  Lab 04/04/20 0550 04/04/20 1600 04/05/20 0453 04/05/20 0453 04/06/20 0548 04/06/20 1123 04/07/20 0416 04/07/20 0826 04/07/20 1746 04/08/20 0137 04/08/20 0631 04/08/20 1710 04/09/20 0145  NA 122*   < > 120*   < > 119*   < > 120*  121*   < > 124* 124* 126*  126* 125* 127*  K 3.7  --  3.7  --  3.6  --  3.6  --   --   --  4.0  --  3.8  CL 84*  --  83*  --  83*  --  86*  --   --   --  91*  --  92*  CO2 28  --  26  --  25  --  24  --   --   --  26  --  25  GLUCOSE 111*  --  110*  --  104*  --  160*  --   --   --  114*  --  152*  BUN 8  --  9  --  12  --  13  --   --   --  10  --  15  CREATININE 0.68  --  0.61  --  0.68  0.68  --  0.73  --   --   --  0.72  --  0.72  CALCIUM 8.6*  --  8.6*  --  8.7*  --  8.6*  --   --   --  9.0  --  9.2  MG 1.6*  --  1.7  --  1.5*  --  1.5*  --   --   --  1.6*  --   --   PHOS 4.0  --  4.3  --  4.5  --  3.8  --   --   --  4.5  --  4.9*   < > = values in this interval not displayed.   GFR: Estimated Creatinine Clearance: 121.7 mL/min (by C-G formula based on SCr of 0.72 mg/dL). Liver  Function Tests: Recent Labs  Lab 04/05/20 0453 04/06/20 0548 04/07/20 0416 04/08/20 0631 04/09/20 0145  ALBUMIN 2.5* 2.6* 2.6* 2.6* 2.8*   No results for input(s): LIPASE, AMYLASE in the last 168 hours. No results for input(s): AMMONIA in the last 168 hours. Coagulation Profile: No results for input(s): INR, PROTIME in the last 168 hours. Cardiac Enzymes: No results for input(s): CKTOTAL, CKMB, CKMBINDEX, TROPONINI in the last 168 hours. BNP (last 3 results) No results for input(s): PROBNP in the last 8760 hours. HbA1C: No results for input(s): HGBA1C in the last 72 hours. CBG: Recent Labs  Lab 04/08/20 0721 04/08/20 1117 04/08/20 1612 04/09/20 0716 04/09/20 1111  GLUCAP 123* 158* 142* 120* 168*   Lipid Profile: Recent Labs    04/09/20 0145  CHOL 149  HDL 44  LDLCALC 82  TRIG 114  CHOLHDL 3.4    Thyroid Function Tests: No results for input(s): TSH, T4TOTAL, FREET4, T3FREE, THYROIDAB in the last 72 hours. Anemia Panel: Recent Labs    04/07/20 1746  FERRITIN 456*  TIBC 220*  IRON 27*   Urine analysis:    Component Value Date/Time   COLORURINE YELLOW 04/04/2020 1650   APPEARANCEUR CLOUDY (A) 04/04/2020 1650   APPEARANCEUR Clear 05/02/2014 0134   LABSPEC 1.009 04/04/2020 1650   LABSPEC 1.004 05/02/2014 0134   PHURINE 8.0 04/04/2020 1650   GLUCOSEU NEGATIVE 04/04/2020 1650   GLUCOSEU Negative 05/02/2014 0134   HGBUR NEGATIVE 04/04/2020 1650   BILIRUBINUR NEGATIVE 04/04/2020 1650   BILIRUBINUR Negative 05/02/2014 0134   KETONESUR NEGATIVE 04/04/2020 1650   PROTEINUR NEGATIVE 04/04/2020 1650   NITRITE NEGATIVE 04/04/2020 1650   LEUKOCYTESUR NEGATIVE 04/04/2020 1650   LEUKOCYTESUR Negative 05/02/2014 0134     Yomar Mejorado M.D. [facility] 04/09/2020, 11:53 AM   Call night coverage person covering after 7pm

## 2020-04-09 NOTE — Progress Notes (Signed)
Lab technician that patient had a change in responses from earlier blood collections. Upon examination patient opened eyes when spoken to, though closed right after wards. Physical shaking aroused patient, whom continued to present with downiness, requiring verbal or physical touch to respond with short attention span. Vitals and cbg  taken and WNL. Patient has previous left sided deficits. Patient was able to state his name, DOB, raise his right arm, stick out toungue, blow cheeks out, raise eyebrows. Tongue deviated to the left and rapid response was called. Pupil size change from last recorded 3  to a 1. Patient was taken to CT per order.  Maurilio Lovely RN 04/09/2020@1430 

## 2020-04-09 NOTE — Consult Note (Signed)
Referring Physician: Dr. Benjamine Mola    Chief Complaint: Acute left frontal lobe subcentimeter ischemic infarction  HPI: Brad Graham is an 59 y.o. 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    During his stay at [facility], he was treated for his hyponatremia and acute on chronic diastolic CHF.   On Friday, during his 1 AM assessment, new right sided weakness was noted. An MRI brain was obtained, revealing an acute subcentimeter ischemic infarction in the left frontal gyrus. Stroke work up was initiated and he was transferred to [facility] for further management. He was continued on his daily ASA and Lipitor was increased to 80 mg po qd.   The patient is a poor historian. For example, he states that he had a recent seizure but does not have a long-standing history of such, contrary to documentation in Epic.   Past Medical History:  Diagnosis Date  . Alcohol abuse   . Alcohol abuse, continuous   . Arthritis   . Centrencephalic epilepsy (HCC)   . Diabetes mellitus without complication (HCC)   . H/O: CVA (cerebrovascular accident)   . Hemiparesis affecting left side as late effect of cerebrovascular accident (HCC)   . Hyperlipidemia   . Hypertension   . Syncope 07/02/2016  . Syncope and collapse 01/27/2020    Past Surgical History:  Procedure Laterality Date  . none      Family History  Problem Relation Age of Onset  . Diabetes Mother   . Stroke Mother   . Heart disease Maternal Grandmother   . Heart disease Maternal  Grandfather    Social History:  reports that he has been smoking cigarettes. He has been smoking about 0.50 packs per day. He has never used smokeless tobacco. He reports previous alcohol use. He reports that he does not use drugs.  Allergies: No Known Allergies  Medications:  Scheduled: . aspirin EC  81 mg Oral Daily  . atorvastatin  80 mg Oral q1800  . carvedilol  25 mg Oral BID WC  . Chlorhexidine Gluconate Cloth  6 each Topical Daily  . cholecalciferol  2,000 Units Oral q morning - 10a  . enoxaparin (LOVENOX) injection  55 mg Subcutaneous Q24H  . folic acid  1 mg Oral Daily  . insulin aspart  0-9 Units Subcutaneous TID WC  . magnesium oxide  800 mg Oral BID  . pantoprazole  40 mg Oral Daily  . phenytoin  200 mg Oral Daily  . phenytoin  300 mg Oral QHS  . Ensure Max Protein  11 oz Oral BID  . senna-docusate  2 tablet Oral QHS  . sodium chloride flush  3 mL Intravenous Q12H  . tamsulosin  0.4 mg Oral QPC supper  . thiamine  100 mg Oral Daily    ROS: Unable to obtain a reliable ROS due to impaired memory.   Physical Examination: Blood pressure 121/73, pulse 86, temperature 97.9 F (36.6 C), temperature source Oral, resp. rate 18, height 6' (1.829 m), weight 105.4 kg, SpO2 99 %.  HEENT: [place]/AT Lungs: Respirations unlabored.  Ext: Warm and well-perfused  Neurologic Examination: Mental Status: Alert, oriented to city, state, day of week, year and month. Poor historian regarding medical conditions and stroke history. Speech is fluent in the context of stuttering, with no errors of grammar or syntax. Able to follow all commands. Able to name a glove, thumb and pinky finger.   Cranial Nerves: II:  Visual fields grossly intact with no extinction to DSS. PERRL III,IV, VI: EOM are full with saccadic pursuits noted.  V,VII: Mild left sided facial droop. Temp sensation subjectively equal bilaterally  VIII: hearing intact to voice IX,X: No hypophonia XI: Lag on the left with shoulder  shrug XII: midline tongue extension  Motor: RUE: 5/5 deltoid, triceps and biceps. Wrist extension 3/5. Grip 3/5. Weak finger abduction and extension. Unable to flex digits 3-5, which patient states is chronic.  RLE 4+/5 LUE: Increased flexor tone. 3/5 strength proximally and distally. Bradykinetic.  LLE: Increased tone. Able to elevate antigravity 1 inch above the bed (3/5). Knee extension and flexion 4-/5. ADF/APF 4/5. Bradykinetic with increased tone.  Sensory: Temp and light touch sensation are subjectively intact in all 4 limbs. Positive for left sided extinction when testing DSS.  Deep Tendon Reflexes:  3+ right biceps and brachioradialis Hypoactive left biceps and brachioradialis in the context of rigidity Hypoactive patellae bilaterally Toes equivocal.  Cerebellar: Dysmetria with FNF bilaterally  Gait: Deferred  Results for orders placed or performed during the hospital encounter of 03/30/20 (from the past 48 hour(s))  Glucose, capillary     Status: Abnormal   Collection Time: 04/07/20  9:26 PM  Result Value Ref Range   Glucose-Capillary 158 (H) 70 - 99 mg/dL    Comment: Glucose reference range applies only to samples taken after fasting for at least 8 hours.  Sodium     Status: Abnormal   Collection Time: 04/08/20  1:37 AM  Result Value Ref Range   Sodium 124 (L) 135 - 145 mmol/L    Comment: Performed at [facility], [address], [place], [place] [address]  Renal function panel     Status: Abnormal   Collection Time: 04/08/20  6:31 AM  Result Value Ref Range   Sodium 126 (L) 135 - 145 mmol/L   Potassium 4.0 3.5 - 5.1 mmol/L   Chloride 91 (L) 98 - 111 mmol/L   CO2 26 22 - 32 mmol/L   Glucose, Bld 114 (H) 70 - 99 mg/dL    Comment: Glucose reference range applies only to samples taken after fasting for at least 8 hours.   BUN 10 8 - 23 mg/dL   Creatinine, Ser 8.41 0.61 - 1.24 mg/dL   Calcium 9.0 8.9 - 32.4 mg/dL   Phosphorus 4.5 2.5 - 4.6 mg/dL   Albumin 2.6 (L) 3.5 -  5.0 g/dL   GFR calc non Af Amer >60 >60 mL/min   GFR calc Af Amer >60 >60 mL/min   Anion gap 9 5 - 15    Comment: Performed at [facility], [address] [place], [place] [address]  Magnesium     Status: Abnormal   Collection Time: 04/08/20  6:31 AM  Result Value Ref Range   Magnesium 1.6 (L) 1.7 - 2.4 mg/dL    Comment: Performed at [facility], [address] [place], [place] [address]  Sodium     Status: Abnormal   Collection Time: 04/08/20  6:31 AM  Result Value Ref Range   Sodium 126 (L) 135 - 145 mmol/L    Comment: Performed at [facility], [address], [place], [place] [address]  Glucose, capillary     Status: Abnormal   Collection Time: 04/08/20  7:21 AM  Result Value Ref Range   Glucose-Capillary 123 (H) 70 - 99 mg/dL    Comment: Glucose reference range applies only to samples taken after fasting for at least 8 hours.  Glucose, capillary     Status: Abnormal   Collection Time: 04/08/20 11:17 AM  Result Value Ref Range   Glucose-Capillary 158 (H) 70 - 99 mg/dL    Comment: Glucose reference range applies only to samples taken after fasting for at least 8 hours.  Glucose, capillary     Status: Abnormal   Collection Time: 04/08/20  4:12 PM  Result Value Ref Range   Glucose-Capillary 142 (H) 70 - 99 mg/dL    Comment: Glucose reference range applies only to samples taken after fasting for at least 8 hours.  Sodium     Status: Abnormal   Collection Time: 04/08/20  5:10 PM  Result Value Ref Range   Sodium 125 (L) 135 - 145 mmol/L    Comment: Performed at [facility], [address], [place], [place] [address]  SARS Coronavirus 2 by RT PCR (hospital order, performed in [facility] hospital lab) Nasopharyngeal Nasopharyngeal Swab     Status: None   Collection Time: 04/08/20  7:30 PM   Specimen: Nasopharyngeal Swab  Result Value Ref Range   SARS Coronavirus 2 NEGATIVE NEGATIVE    Comment: (NOTE) SARS-CoV-2 target nucleic acids are NOT DETECTED.  The SARS-CoV-2 RNA is  generally detectable in upper and lower respiratory specimens during the acute phase of infection. The lowest concentration of SARS-CoV-2 viral copies this assay can detect is 250 copies / mL. A negative result does not preclude SARS-CoV-2 infection and should not be used as the sole basis for treatment or other patient management decisions.  A negative result may occur with improper specimen collection / handling, submission of specimen other than nasopharyngeal swab, presence of viral mutation(s) within the areas targeted by this assay, and inadequate number of viral copies (<250 copies / mL). A negative result must be combined with clinical observations, patient history, and epidemiological information.  Fact Sheet for Patients:   BoilerBrush.com.cy  Fact Sheet for Healthcare Providers: https://pope.com/  This test is not yet approved or  cleared by the [place] FDA and has been authorized for detection and/or diagnosis of SARS-CoV-2 by FDA under an Emergency Use Authorization (EUA).  This EUA will remain in effect (meaning this test can be used) for the duration of the COVID-19 declaration under Section 564(b)(1) of the Act, 21 U.S.C. section 360bbb-3(b)(1), unless the authorization is terminated or revoked sooner.  Performed at [facility], [address], [place], [place] [address]   Renal function panel     Status: Abnormal   Collection Time: 04/09/20  1:45 AM  Result Value Ref Range   Sodium 127 (L) 135 - 145 mmol/L   Potassium 3.8 3.5 - 5.1 mmol/L   Chloride 92 (L) 98 - 111 mmol/L   CO2 25 22 - 32 mmol/L   Glucose, Bld 152 (H) 70 - 99 mg/dL    Comment: Glucose reference range applies only to samples taken after fasting for at least 8 hours.   BUN 15 8 - 23 mg/dL   Creatinine, Ser 2.84 0.61 - 1.24 mg/dL   Calcium 9.2 8.9 - 13.2 mg/dL   Phosphorus 4.9 (H) 2.5 - 4.6 mg/dL   Albumin 2.8 (L)  3.5 - 5.0 g/dL   GFR calc  non Af Amer >60 >60 mL/min   GFR calc Af Amer >60 >60 mL/min   Anion gap 10 5 - 15    Comment: Performed at [facility], [address], [place], [place] [address]  Hemoglobin A1c     Status: Abnormal   Collection Time: 04/09/20  1:45 AM  Result Value Ref Range   Hgb A1c MFr Bld 7.1 (H) 4.8 - 5.6 %    Comment: (NOTE) Pre diabetes:          5.7%-6.4%  Diabetes:              >6.4%  Glycemic control for   <7.0% adults with diabetes    Mean Plasma Glucose 157.07 mg/dL    Comment: Performed at [facility], [address] [place], [place] [address]  Lipid panel     Status: None   Collection Time: 04/09/20  1:45 AM  Result Value Ref Range   Cholesterol 149 0 - 200 mg/dL   Triglycerides 353 <614 mg/dL   HDL 44 >43 mg/dL   Total CHOL/HDL Ratio 3.4 RATIO   VLDL 23 0 - 40 mg/dL   LDL Cholesterol 82 0 - 99 mg/dL    Comment:        Total Cholesterol/HDL:CHD Risk Coronary Heart Disease Risk Table                     Men   Women  1/2 Average Risk   3.4   3.3  Average Risk       5.0   4.4  2 X Average Risk   9.6   7.1  3 X Average Risk  23.4   11.0        Use the calculated Patient Ratio above and the CHD Risk Table to determine the patient's CHD Risk.        ATP III CLASSIFICATION (LDL):  <100     mg/dL   Optimal  154-008  mg/dL   Near or Above                    Optimal  130-159  mg/dL   Borderline  676-195  mg/dL   High  >093     mg/dL   Very High Performed at [facility], [address] [place], [place] [address]   Glucose, capillary     Status: Abnormal   Collection Time: 04/09/20  7:16 AM  Result Value Ref Range   Glucose-Capillary 120 (H) 70 - 99 mg/dL    Comment: Glucose reference range applies only to samples taken after fasting for at least 8 hours.  Glucose, capillary     Status: Abnormal   Collection Time: 04/09/20 11:11 AM  Result Value Ref Range   Glucose-Capillary 168 (H) 70 - 99 mg/dL    Comment: Glucose reference range applies only to samples taken  after fasting for at least 8 hours.  Sodium     Status: Abnormal   Collection Time: 04/09/20 12:03 PM  Result Value Ref Range   Sodium 130 (L) 135 - 145 mmol/L    Comment: Performed at [facility], [address] [place], [place] [address]  Glucose, capillary     Status: Abnormal   Collection Time: 04/09/20 12:15 PM  Result Value Ref Range   Glucose-Capillary 121 (H) 70 - 99 mg/dL    Comment: Glucose reference range applies only to samples taken after  fasting for at least 8 hours.  Glucose, capillary     Status: Abnormal   Collection Time: 04/09/20  6:26 PM  Result Value Ref Range   Glucose-Capillary 104 (H) 70 - 99 mg/dL    Comment: Glucose reference range applies only to samples taken after fasting for at least 8 hours.   CT HEAD WO CONTRAST  Result Date: 04/09/2020 CLINICAL DATA:  Neuro deficit subacute.  Unresponsive. EXAM: CT HEAD WITHOUT CONTRAST TECHNIQUE: Contiguous axial images were obtained from the base of the skull through the vertex without intravenous contrast. COMPARISON:  CT head 03/30/2020 FINDINGS: Brain: Generalized atrophy. Chronic microvascular ischemic changes in the white matter stable. Chronic infarct in the right external capsule unchanged. Negative for acute infarct, hemorrhage, mass Vascular: Negative for hyperdense vessel Skull: Negative Sinuses/Orbits: Paranasal sinuses clear.  Negative orbit. Other: None IMPRESSION: Atrophy and chronic ischemia. No acute abnormality and no change from the recent CT. Electronically Signed   By: Marlan Palau M.D.   On: 04/09/2020 13:01   MR ANGIO HEAD WO CONTRAST  Result Date: 04/08/2020 CLINICAL DATA:  Right arm weakness EXAM: MRI HEAD WITHOUT CONTRAST MRA HEAD WITHOUT CONTRAST TECHNIQUE: Multiplanar, multiecho pulse sequences of the brain and surrounding structures were obtained without intravenous contrast. Angiographic images of the head were obtained using MRA technique without contrast. COMPARISON:  2015 FINDINGS: MRI HEAD  Motion artifact is present. Brain: A punctate focus of cortical reduced diffusion is present along the left superior frontal gyrus. Prominence of the ventricles and sulci reflects generalized parenchymal volume loss, which appears progressed since 2015. Patchy and confluent areas of T2 hyperintensity in the supratentorial and pontine white matter are nonspecific but probably reflect moderate chronic microvascular ischemic changes. 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 Additional chronic small vessel infarct of the left centrum semiovale. A few foci of susceptibility along the left basal ganglia likely reflect chronic microhemorrhages. There is no intracranial mass, mass effect, or edema. There is no hydrocephalus or extra-axial fluid collection. Vascular: Major vessel flow voids at the skull base are preserved. Skull and upper cervical spine: Normal marrow signal is preserved. Sinuses/Orbits: Paranasal sinuses are aerated. Orbits are unremarkable. Other: Sella is unremarkable.  Mastoid air cells are clear. MRA HEAD Motion artifact is present. Intracranial internal carotid arteries are patent with atherosclerotic irregularity and potentially moderate to marked stenosis in the paraclinoid regions. Proximal middle and anterior cerebral arteries are patent. Intracranial vertebral arteries, basilar artery, proximal posterior cerebral arteries are patent. IMPRESSION: Motion degraded. Punctate acute cortical infarct of the left superior frontal gyrus. Moderate chronic microvascular ischemic changes. Small chronic infarcts and microhemorrhages as described. No proximal intracranial vessel occlusion. Potential moderate to marked stenosis of the paraclinoid internal carotid arteries bilaterally, which could be overestimated due to artifact. Electronically Signed   By: Guadlupe Spanish M.D.   On: 04/08/2020 16:03   MR BRAIN WO CONTRAST  Result Date:  04/08/2020 CLINICAL DATA:  Right arm weakness EXAM: MRI HEAD WITHOUT CONTRAST MRA HEAD WITHOUT CONTRAST TECHNIQUE: Multiplanar, multiecho pulse sequences of the brain and surrounding structures were obtained without intravenous contrast. Angiographic images of the head were obtained using MRA technique without contrast. COMPARISON:  2015 FINDINGS: MRI HEAD Motion artifact is present. Brain: A punctate focus of cortical reduced diffusion is present along the left superior frontal gyrus. Prominence of the ventricles and sulci reflects generalized parenchymal volume loss, which appears progressed since 2015. Patchy and confluent areas of T2 hyperintensity  in the supratentorial and pontine white matter are nonspecific but probably reflect moderate chronic microvascular ischemic changes. 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 Additional chronic small vessel infarct of the left centrum semiovale. A few foci of susceptibility along the left basal ganglia likely reflect chronic microhemorrhages. There is no intracranial mass, mass effect, or edema. There is no hydrocephalus or extra-axial fluid collection. Vascular: Major vessel flow voids at the skull base are preserved. Skull and upper cervical spine: Normal marrow signal is preserved. Sinuses/Orbits: Paranasal sinuses are aerated. Orbits are unremarkable. Other: Sella is unremarkable.  Mastoid air cells are clear. MRA HEAD Motion artifact is present. Intracranial internal carotid arteries are patent with atherosclerotic irregularity and potentially moderate to marked stenosis in the paraclinoid regions. Proximal middle and anterior cerebral arteries are patent. Intracranial vertebral arteries, basilar artery, proximal posterior cerebral arteries are patent. IMPRESSION: Motion degraded. Punctate acute cortical infarct of the left superior frontal gyrus. Moderate chronic microvascular ischemic changes. Small  chronic infarcts and microhemorrhages as described. No proximal intracranial vessel occlusion. Potential moderate to marked stenosis of the paraclinoid internal carotid arteries bilaterally, which could be overestimated due to artifact. Electronically Signed   By: Guadlupe SpanishPraneil  Patel M.D.   On: 04/08/2020 16:03   ECHOCARDIOGRAM COMPLETE  Result Date: 04/09/2020    ECHOCARDIOGRAM REPORT   Patient Name:   Brad Graham Date of Exam: 04/09/2020 Medical Rec #:  [id number]        Height:       72.0 in Accession #:    [id number]       Weight:       232.4 lb Date of Birth:  07/01/1961         BSA:          2.271 m Patient Age:    59 years         BP:           126/84 mmHg Patient Gender: M                HR:           94 bpm. [facility] Procedure: 2D Echo Indications:    stroke 434.91  History:        Patient has prior history of Echocardiogram examinations, most                 recent 03/05/2020. Risk Factors:Hypertension, Diabetes and                 Dyslipidemia. CVA. ETOH Abuse.  Sonographer:    Celene SkeenVijay Shankar RDCS (AE) Referring Phys: [id number] DAVID TAT  Sonographer Comments: Image acquisition challenging due to respiratory motion. see comments IMPRESSIONS  1. Left ventricular ejection fraction, by estimation, is 70 to 75%. The left ventricle has normal function. The left ventricle has no regional wall motion abnormalities. There is mild left ventricular hypertrophy. Indeterminate diastolic filling due to E-A fusion.  2. Right ventricular systolic function is normal. The right ventricular size is mildly enlarged.  3. The mitral valve is abnormal. Trivial mitral valve regurgitation.  4. The aortic valve is grossly normal. Aortic valve regurgitation is not visualized.  5. The inferior vena cava is normal in size with greater than 50% respiratory variability, suggesting right atrial pressure of 3 mmHg. Comparison(s): No significant change from prior study. FINDINGS  Left Ventricle: Left ventricular ejection  fraction, by estimation, is 70 to 75%. The left ventricle  has normal function. The left ventricle has no regional wall motion abnormalities. The left ventricular internal cavity size was normal in size. There is  mild left ventricular hypertrophy. Indeterminate diastolic filling due to E-A fusion. Right Ventricle: The right ventricular size is mildly enlarged. Right vetricular wall thickness was not assessed. Right ventricular systolic function is normal. Left Atrium: Left atrial size was normal in size. Right Atrium: Right atrial size was normal in size. Pericardium: There is no evidence of pericardial effusion. Mitral Valve: The mitral valve is abnormal. There is mild thickening of the mitral valve leaflet(s). Mild mitral annular calcification. Trivial mitral valve regurgitation. Tricuspid Valve: The tricuspid valve is normal in structure. Tricuspid valve regurgitation is mild. Aortic Valve: The aortic valve is grossly normal. Aortic valve regurgitation is not visualized. Pulmonic Valve: The pulmonic valve was not well visualized. Pulmonic valve regurgitation is trivial. Aorta: The aortic root is normal in size and structure. Venous: The inferior vena cava is normal in size with greater than 50% respiratory variability, suggesting right atrial pressure of 3 mmHg. IAS/Shunts: The interatrial septum was not well visualized.  LEFT VENTRICLE PLAX 2D LVIDd:         4.27 cm  Diastology LVIDs:         3.04 cm  LV e' medial:   8.05 cm/s LV PW:         1.02 cm  LV E/e' medial: 7.9 LV IVS:        1.21 cm LVOT diam:     2.20 cm LV SV:         50 LV SV Index:   22 LVOT Area:     3.80 cm  LEFT ATRIUM             Index       RIGHT ATRIUM           Index LA diam:        3.00 cm 1.32 cm/m  RA Area:     16.70 cm LA Vol (A2C):   37.4 ml 16.47 ml/m RA Volume:   38.60 ml  17.00 ml/m LA Vol (A4C):   62.5 ml 27.52 ml/m LA Biplane Vol: 50.5 ml 22.24 ml/m  AORTIC VALVE LVOT Vmax:   77.70 cm/s LVOT Vmean:  55.100 cm/s LVOT VTI:     0.132 m  AORTA Ao Root diam: 3.20 cm MITRAL VALVE MV Area (PHT): 2.73 cm    SHUNTS MV Decel Time: 278 msec    Systemic VTI:  0.13 m MV E velocity: 63.40 cm/s  Systemic Diam: 2.20 cm MV A velocity: 64.30 cm/s MV E/A ratio:  0.99 Dietrich Pates MD Electronically signed by Dietrich Pates MD Signature Date/Time: 04/09/2020/10:16:54 AM    Final     Assessment: 59 y.o. male presenting with subcentimeter acute left frontal gyrus cortically-based ischemic infarction.  1. Exam reveals old left sided motor deficits and new right wrist and hand weakness.  2. MRI brain: Punctate acute cortical infarct of the left superior frontal gyrus. Moderate chronic microvascular ischemic changes. Generalized parenchymal volume loss, which appears progressed since 2015.Moderate chronic microvascular ischemic changes. Chronic hemorrhagic infarct involving the right basal ganglia and subinsular white matter. Additional chronic small vessel infarct of the left centrum semiovale. A few foci of susceptibility along the left basal ganglia likely reflect chronic microhemorrhages.  3. MRA head: Intracranial internal carotid arteries are patent with atherosclerotic irregularity and potentially moderate to marked stenosis in the paraclinoid regions. Proximal middle and anterior cerebral arteries  are patent. Intracranial vertebral arteries, basilar artery, proximal posterior cerebral arteries are patent.  4. Echocardiogram: Left ventricular ejection fraction, by estimation, is 70 to 75%. The left ventricle has normal function. The left ventricle has no regional wall motion abnormalities. There is mild left ventricular hypertrophy. Indeterminate diastolic filling due to E-A fusion. Right ventricular systolic function is normal. The right ventricular size is mildly enlarged. The mitral valve is abnormal. Trivial mitral valve regurgitation. The aortic valve is grossly normal. Aortic valve regurgitation is not visualized. The inferior vena cava is normal  in size with greater than 50% respiratory variability, suggesting right atrial pressure of 3 mmHg.   5. EKG 03/30/20: Sinus tachycardia. Borderline T abnormalities, inferior leads. Borderline prolonged QT interval. Baseline wander in lead(s) V6. 6. Stroke Risk Factors - DM, HLD and HTN 7. Seizures. Most recent phenytoin level on 7/7 was therapeutic at 12.0.   Recommendations: 1. HgbA1c, fasting lipid panel 2. Carotid ultrasound 3. PT consult, OT consult, Speech consult 4. 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  5. Frequent neuro checks 6. Continue atorvastatin.  7. Risk factor modification 8. Telemetry monitoring 9. BP management. 10. Continue phenytoin 200 mg qam and 300 mg qhs.     signed: Dr. Caryl Pina  04/09/2020, 7:26 PM

## 2020-04-09 NOTE — Progress Notes (Signed)
Patient ID: Brad Graham, male   DOB: 04/29/1961, 59 y.o.   MRN: [id number] [facility] Progress Note   Assessment/ Plan:   1. Hyponatremia: Suspect acute on chronic hypervolemic hyponatremia.  Previously treated with 3% saline and then tolvaptan with current sodium 130.  I had ordered tolvaptan earlier after remote view of his chart from [facility] however, based on the acute mental status changes in the interim and his sodium level now being 130, will discontinue this and continue to monitor him. 2.  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 3.  Acute exacerbation of diastolic heart failure: Status post diuretic therapy with excellent urine output off of diuretics at this time, appears to be euvolemic on exam. 4.  Anemia: Likely secondary to acute illness/hospitalization, no overt loss.  Status post intravenous iron yesterday. 5.  Urinary tract infection: Status post completion of antibiotic therapy.  Subjective:   Rapid response called just prior to my arrival for a change in his neurological exam with anisocoria, tongue deviation and declining mental status-on route to stat CT scan of the head.   Objective:   BP 129/76 (BP Location: Left Arm)   Pulse 89   Temp 99 F (37.2 C) (Oral)   Resp 20   Ht 6' (1.829 m)   Wt 105.4 kg   SpO2 99%   BMI 31.51 kg/m   Intake/Output Summary (Last 24 hours) at 04/09/2020 1241 Last data filed at 04/09/2020 0900 Gross per 24 hour  Intake 720 ml  Output 3750 ml  Net -3030 ml   Weight change:   Physical Exam: Gen: With altered mental status, anisocoria and tongue deviation CVS: Pulse regular, normal rate, S1 and S2 normal Resp: Breath sounds bilaterally, no distinct rales/rhonchi Abd: Soft, obese, nontender, bowel sounds normal.  Ext: No edema of the lower  extremities.  Imaging: MR ANGIO HEAD WO CONTRAST  Result Date: 04/08/2020 CLINICAL DATA:  Right arm weakness EXAM: MRI HEAD WITHOUT CONTRAST MRA HEAD WITHOUT CONTRAST TECHNIQUE: Multiplanar, multiecho pulse sequences of the brain and surrounding structures were obtained without intravenous contrast. Angiographic images of the head were obtained using MRA technique without contrast. COMPARISON:  2015 FINDINGS: MRI HEAD Motion artifact is present. Brain: A punctate focus of cortical reduced diffusion is present along the left superior frontal gyrus. Prominence of the ventricles and sulci reflects generalized parenchymal volume loss, which appears progressed since 2015. Patchy and confluent areas of T2 hyperintensity in the supratentorial and pontine white matter are nonspecific but probably reflect moderate chronic microvascular ischemic changes. 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 Additional chronic small vessel infarct of the left centrum semiovale. A few foci of susceptibility along the left basal ganglia likely reflect chronic microhemorrhages. There is no intracranial mass, mass effect, or edema. There is no hydrocephalus or extra-axial fluid collection. Vascular: Major vessel flow voids at the skull base are preserved. Skull and upper cervical spine: Normal marrow signal is preserved. Sinuses/Orbits: Paranasal sinuses are aerated. Orbits are unremarkable. Other: Sella is unremarkable.  Mastoid air cells are clear. MRA HEAD Motion artifact is present. Intracranial internal carotid arteries are patent with atherosclerotic irregularity and potentially moderate to marked stenosis in the paraclinoid regions. Proximal middle and anterior cerebral arteries are patent. Intracranial vertebral arteries, basilar artery, proximal posterior cerebral arteries are patent. IMPRESSION: Motion  degraded. Punctate acute cortical infarct of the left superior  frontal gyrus. Moderate chronic microvascular ischemic changes. Small chronic infarcts and microhemorrhages as described. No proximal intracranial vessel occlusion. Potential moderate to marked stenosis of the paraclinoid internal carotid arteries bilaterally, which could be overestimated due to artifact. Electronically Signed   By: Guadlupe Spanish M.D.   On: 04/08/2020 16:03   MR BRAIN WO CONTRAST  Result Date: 04/08/2020 CLINICAL DATA:  Right arm weakness EXAM: MRI HEAD WITHOUT CONTRAST MRA HEAD WITHOUT CONTRAST TECHNIQUE: Multiplanar, multiecho pulse sequences of the brain and surrounding structures were obtained without intravenous contrast. Angiographic images of the head were obtained using MRA technique without contrast. COMPARISON:  2015 FINDINGS: MRI HEAD Motion artifact is present. Brain: A punctate focus of cortical reduced diffusion is present along the left superior frontal gyrus. Prominence of the ventricles and sulci reflects generalized parenchymal volume loss, which appears progressed since 2015. Patchy and confluent areas of T2 hyperintensity in the supratentorial and pontine white matter are nonspecific but probably reflect moderate chronic microvascular ischemic changes. 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 Additional chronic small vessel infarct of the left centrum semiovale. A few foci of susceptibility along the left basal ganglia likely reflect chronic microhemorrhages. There is no intracranial mass, mass effect, or edema. There is no hydrocephalus or extra-axial fluid collection. Vascular: Major vessel flow voids at the skull base are preserved. Skull and upper cervical spine: Normal marrow signal is preserved. Sinuses/Orbits: Paranasal sinuses are aerated. Orbits are unremarkable. Other: Sella is unremarkable.  Mastoid air cells are clear. MRA HEAD Motion artifact is present. Intracranial internal carotid arteries are  patent with atherosclerotic irregularity and potentially moderate to marked stenosis in the paraclinoid regions. Proximal middle and anterior cerebral arteries are patent. Intracranial vertebral arteries, basilar artery, proximal posterior cerebral arteries are patent. IMPRESSION: Motion degraded. Punctate acute cortical infarct of the left superior frontal gyrus. Moderate chronic microvascular ischemic changes. Small chronic infarcts and microhemorrhages as described. No proximal intracranial vessel occlusion. Potential moderate to marked stenosis of the paraclinoid internal carotid arteries bilaterally, which could be overestimated due to artifact. Electronically Signed   By: Guadlupe Spanish M.D.   On: 04/08/2020 16:03   ECHOCARDIOGRAM COMPLETE  Result Date: 04/09/2020    ECHOCARDIOGRAM REPORT   Patient Name:   BUTLER VEGH Date of Exam: 04/09/2020 Medical Rec #:  [id number]        Height:       72.0 in Accession #:    [id number]       Weight:       232.4 lb Date of Birth:  1961-06-18         BSA:          2.271 m Patient Age:    59 years         BP:           126/84 mmHg Patient Gender: M                HR:           94 bpm. [facility] Procedure: 2D Echo Indications:    stroke 434.91  History:        Patient has prior history of Echocardiogram examinations, most                 recent 03/05/2020. Risk Factors:Hypertension, Diabetes and  Dyslipidemia. CVA. ETOH Abuse.  Sonographer:    Celene Skeen RDCS (AE) Referring Phys: [phone] DAVID TAT  Sonographer Comments: Image acquisition challenging due to respiratory motion. see comments IMPRESSIONS  1. Left ventricular ejection fraction, by estimation, is 70 to 75%. The left ventricle has normal function. The left ventricle has no regional wall motion abnormalities. There is mild left ventricular hypertrophy. Indeterminate diastolic filling due to E-A fusion.  2. Right ventricular systolic function is normal. The right ventricular size  is mildly enlarged.  3. The mitral valve is abnormal. Trivial mitral valve regurgitation.  4. The aortic valve is grossly normal. Aortic valve regurgitation is not visualized.  5. The inferior vena cava is normal in size with greater than 50% respiratory variability, suggesting right atrial pressure of 3 mmHg. Comparison(s): No significant change from prior study. FINDINGS  Left Ventricle: Left ventricular ejection fraction, by estimation, is 70 to 75%. The left ventricle has normal function. The left ventricle has no regional wall motion abnormalities. The left ventricular internal cavity size was normal in size. There is  mild left ventricular hypertrophy. Indeterminate diastolic filling due to E-A fusion. Right Ventricle: The right ventricular size is mildly enlarged. Right vetricular wall thickness was not assessed. Right ventricular systolic function is normal. Left Atrium: Left atrial size was normal in size. Right Atrium: Right atrial size was normal in size. Pericardium: There is no evidence of pericardial effusion. Mitral Valve: The mitral valve is abnormal. There is mild thickening of the mitral valve leaflet(s). Mild mitral annular calcification. Trivial mitral valve regurgitation. Tricuspid Valve: The tricuspid valve is normal in structure. Tricuspid valve regurgitation is mild. Aortic Valve: The aortic valve is grossly normal. Aortic valve regurgitation is not visualized. Pulmonic Valve: The pulmonic valve was not well visualized. Pulmonic valve regurgitation is trivial. Aorta: The aortic root is normal in size and structure. Venous: The inferior vena cava is normal in size with greater than 50% respiratory variability, suggesting right atrial pressure of 3 mmHg. IAS/Shunts: The interatrial septum was not well visualized.  LEFT VENTRICLE PLAX 2D LVIDd:         4.27 cm  Diastology LVIDs:         3.04 cm  LV e' medial:   8.05 cm/s LV PW:         1.02 cm  LV E/e' medial: 7.9 LV IVS:        1.21 cm LVOT  diam:     2.20 cm LV SV:         50 LV SV Index:   22 LVOT Area:     3.80 cm  LEFT ATRIUM             Index       RIGHT ATRIUM           Index LA diam:        3.00 cm 1.32 cm/m  RA Area:     16.70 cm LA Vol (A2C):   37.4 ml 16.47 ml/m RA Volume:   38.60 ml  17.00 ml/m LA Vol (A4C):   62.5 ml 27.52 ml/m LA Biplane Vol: 50.5 ml 22.24 ml/m  AORTIC VALVE LVOT Vmax:   77.70 cm/s LVOT Vmean:  55.100 cm/s LVOT VTI:    0.132 m  AORTA Ao Root diam: 3.20 cm MITRAL VALVE MV Area (PHT): 2.73 cm    SHUNTS MV Decel Time: 278 msec    Systemic VTI:  0.13 m MV E velocity: 63.40 cm/s  Systemic Diam: 2.20  cm MV A velocity: 64.30 cm/s MV E/A ratio:  0.99 Dietrich Pates MD Electronically signed by Dietrich Pates MD Signature Date/Time: 04/09/2020/10:16:54 AM    Final     Labs: BMET Recent Labs  Lab 04/03/20 0530 04/03/20 0530 04/04/20 0550 04/04/20 1600 04/05/20 0453 04/05/20 0453 04/06/20 9604 04/06/20 1123 04/07/20 5409 04/07/20 8119 04/07/20 1148 04/07/20 1746 04/08/20 0137 04/08/20 0631 04/08/20 1710 04/09/20 0145 04/09/20 1203  NA 122*   < > 122*   < > 120*   < > 119*   < > 120*  121*   < > 122* 124* 124* 126*  126* 125* 127* 130*  K 3.6  --  3.7  --  3.7  --  3.6  --  3.6  --   --   --   --  4.0  --  3.8  --   CL 83*  --  84*  --  83*  --  83*  --  86*  --   --   --   --  91*  --  92*  --   CO2 30  --  28  --  26  --  25  --  24  --   --   --   --  26  --  25  --   GLUCOSE 110*  --  111*  --  110*  --  104*  --  160*  --   --   --   --  114*  --  152*  --   BUN 6*  --  8  --  9  --  12  --  13  --   --   --   --  10  --  15  --   CREATININE 0.70  --  0.68  --  0.61  --  0.68  0.68  --  0.73  --   --   --   --  0.72  --  0.72  --   CALCIUM 8.4*  --  8.6*  --  8.6*  --  8.7*  --  8.6*  --   --   --   --  9.0  --  9.2  --   PHOS  --   --  4.0  --  4.3  --  4.5  --  3.8  --   --   --   --  4.5  --  4.9*  --    < > = values in this interval not displayed.   CBC Recent Labs  Lab 04/03/20 0530  04/04/20 0550 04/06/20 0548  WBC 3.9* 4.0 5.0  NEUTROABS 1.8 2.1  --   HGB 8.3* 8.6* 8.3*  HCT 25.5* 26.6* 26.0*  MCV 84.7 84.4 83.9  PLT 282 271 298    Medications:    . aspirin EC  81 mg Oral Daily  . atorvastatin  80 mg Oral q1800  . carvedilol  25 mg Oral BID WC  . Chlorhexidine Gluconate Cloth  6 each Topical Daily  . cholecalciferol  2,000 Units Oral q morning - 10a  . enoxaparin (LOVENOX) injection  55 mg Subcutaneous Q24H  . folic acid  1 mg Oral Daily  . insulin aspart  0-9 Units Subcutaneous TID WC  . magnesium oxide  800 mg Oral BID  . pantoprazole  40 mg Oral Daily  . phenytoin  200 mg Oral Daily  . phenytoin  300 mg Oral QHS  .  Ensure Max Protein  11 oz Oral BID  . senna-docusate  2 tablet Oral QHS  . sodium chloride flush  3 mL Intravenous Q12H  . tamsulosin  0.4 mg Oral QPC supper  . thiamine  100 mg Oral Daily  . tolvaptan  15 mg Oral Once   Zetta BillsJay Assyria Morreale, MD 04/09/2020, 12:41 PM

## 2020-04-09 NOTE — Progress Notes (Signed)
°   04/09/20 1200  Assess: MEWS Score  Temp 99 F (37.2 C)  BP 129/76  Pulse Rate 89  Resp 20  Level of Consciousness Responds to Voice  SpO2 99 %  O2 Device Room Air  Assess: MEWS Score  MEWS Temp 0  MEWS Systolic 0  MEWS Pulse 0  MEWS RR 0  MEWS LOC 1  MEWS Score 1  MEWS Score Color Green  Assess: if the MEWS score is Yellow or Red  Were vital signs taken at a resting state? Yes  Focused Assessment Documented focused assessment  Early Detection of Sepsis Score *See Row Information* Low  MEWS guidelines implemented *See Row Information* Yes  Treat  MEWS Interventions Other (Comment) (CT scan head)  Take Vital Signs  Increase Vital Sign Frequency  Yellow: Q 2hr X 2 then Q 4hr X 2, if remains yellow, continue Q 4hrs  Escalate  MEWS: Escalate Yellow: discuss with charge nurse/RN and consider discussing with provider and RRT  Notify: Charge Nurse/RN  Name of Charge Nurse/RN Notified Misty Stanley   Date Charge Nurse/RN Notified 04/09/20  Time Charge Nurse/RN Notified 1200  Notify: Provider  Provider Name/Title Dr Isidoro Donning.   Date Provider Notified 04/09/20  Time Provider Notified 1200  Notification Type Page  Notification Reason Change in status  Response See new orders  Date of Provider Response 04/09/20  Time of Provider Response 1200  Notify: Rapid Response  Name of Rapid Response RN Notified Marisa  Date Rapid Response Notified 04/09/20  Time Rapid Response Notified 1200  Document  Patient Outcome Stabilized after interventions  Progress note created (see row info) Yes     04/09/20 1200  Assess: MEWS Score  Temp 99 F (37.2 C)  BP 129/76  Pulse Rate 89  Resp 20  Level of Consciousness Responds to Voice  SpO2 99 %  O2 Device Room Air  Assess: MEWS Score  MEWS Temp 0  MEWS Systolic 0  MEWS Pulse 0  MEWS RR 0  MEWS LOC 1  MEWS Score 1  MEWS Score Color Green  Assess: if the MEWS score is Yellow or Red  Were vital signs taken at a resting state? Yes  Focused  Assessment Documented focused assessment  Early Detection of Sepsis Score *See Row Information* Low  MEWS guidelines implemented *See Row Information* Yes  Treat  MEWS Interventions Other (Comment) (CT scan head)  Take Vital Signs  Increase Vital Sign Frequency  Yellow: Q 2hr X 2 then Q 4hr X 2, if remains yellow, continue Q 4hrs  Escalate  MEWS: Escalate Yellow: discuss with charge nurse/RN and consider discussing with provider and RRT  Notify: Charge Nurse/RN  Name of Charge Nurse/RN Notified Misty Stanley   Date Charge Nurse/RN Notified 04/09/20  Time Charge Nurse/RN Notified 1200  Notify: Provider  Provider Name/Title Dr Isidoro Donning.   Date Provider Notified 04/09/20  Time Provider Notified 1200  Notification Type Page  Notification Reason Change in status  Response See new orders  Date of Provider Response 04/09/20  Time of Provider Response 1200  Notify: Rapid Response  Name of Rapid Response RN Notified Nolberto Hanlon  Date Rapid Response Notified 04/09/20  Time Rapid Response Notified 1200  Document  Patient Outcome Stabilized after interventions  Progress note created (see row info) Yes

## 2020-04-10 DIAGNOSIS — I6330 Cerebral infarction due to thrombosis of unspecified cerebral artery: Secondary | ICD-10-CM | POA: Insufficient documentation

## 2020-04-10 LAB — RENAL FUNCTION PANEL
Albumin: 2.4 g/dL — ABNORMAL LOW (ref 3.5–5.0)
Anion gap: 10 (ref 5–15)
BUN: 12 mg/dL (ref 8–23)
CO2: 24 mmol/L (ref 22–32)
Calcium: 9.7 mg/dL (ref 8.9–10.3)
Chloride: 95 mmol/L — ABNORMAL LOW (ref 98–111)
Creatinine, Ser: 0.79 mg/dL (ref 0.61–1.24)
GFR calc Af Amer: >60 mL/min (ref >60)
GFR calc non Af Amer: >60 mL/min (ref >60)
Glucose, Bld: 116 mg/dL — ABNORMAL HIGH (ref 70–99)
Phosphorus: 5.3 mg/dL — ABNORMAL HIGH (ref 2.5–4.6)
Potassium: 4.2 mmol/L (ref 3.5–5.1)
Sodium: 129 mmol/L — ABNORMAL LOW (ref 135–145)

## 2020-04-10 LAB — GLUCOSE, CAPILLARY
Glucose-Capillary: 90 mg/dL (ref 70–99)
Glucose-Capillary: 146 mg/dL — ABNORMAL HIGH (ref 70–99)
Glucose-Capillary: 83 mg/dL (ref 70–99)
Glucose-Capillary: 151 mg/dL — ABNORMAL HIGH (ref 70–99)

## 2020-04-10 MED ORDER — CLOPIDOGREL BISULFATE 75 MG PO TABS
75.0000 mg | ORAL_TABLET | Freq: Every day | ORAL | Status: DC
  Administered 2020-04-11 – 2020-04-15 (×5): 75 mg via ORAL
  Filled 2020-04-10 (×4): qty 1

## 2020-04-10 MED ORDER — FUROSEMIDE 20 MG PO TABS
20.0000 mg | ORAL_TABLET | Freq: Every day | ORAL | Status: DC
  Administered 2020-04-10 – 2020-04-11 (×2): 20 mg via ORAL
  Filled 2020-04-10 (×2): qty 1

## 2020-04-10 MED ORDER — CLOPIDOGREL BISULFATE 75 MG PO TABS
75.0000 mg | ORAL_TABLET | Freq: Every day | ORAL | Status: DC
  Administered 2020-04-10 – 2020-04-11 (×2): 75 mg via ORAL
  Filled 2020-04-10 (×3): qty 1

## 2020-04-10 NOTE — Progress Notes (Signed)
Patient ID: Brad Graham, male   DOB: [birth year], 59 y.o.   MRN: [id number] [facility] Progress Note   Assessment/ Plan:   1. Hyponatremia: Suspect acute on chronic hypervolemic hyponatremia.  Previously treated with 3% saline and then tolvaptan with sodium this morning of 129.  Will restart low-dose furosemide 20 mg daily and continue to follow sodium level with daily labs. 2.  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 3.  Acute exacerbation of diastolic heart failure: Clinically better following diuretic therapy, will continue to augment volume unloading with furosemide low-dose that should also help with his hyponatremia. 4.  Anemia: Likely secondary to acute illness/hospitalization, no overt loss.  Status post intravenous iron yesterday. 5.  Urinary tract infection: Status post completion of antibiotic therapy.  Subjective:   Brad Graham yesterday night for additional CVA work-up and acute mental status changes.  When seen this morning, he is awake and alert/communicative which is completely different from yesterday.   Objective:   BP 125/68 (BP Location: Right Arm)   Pulse 91   Temp 98.5 F (36.9 C)   Resp 16   Ht 6' (1.829 m)   Wt 105.4 kg   SpO2 97%   BMI 31.51 kg/m   Intake/Output Summary (Last 24 hours) at 04/10/2020 0810 Last data filed at 04/09/2020 2300 Gross per 24 hour  Intake 240 ml  Output 600 ml  Net -360 ml   Weight change:   Physical Exam: Gen: Resting comfortably in bed, watching television CVS: Pulse regular, normal rate, S1 and S2 normal Resp: Breath sounds bilaterally, no distinct rales/rhonchi Abd: Soft, obese, nontender, bowel sounds normal.  Ext: No edema of the lower extremities.  Imaging: CT HEAD WO CONTRAST  Result Date: 04/09/2020 CLINICAL DATA:  Neuro deficit subacute.  Unresponsive. EXAM: CT HEAD WITHOUT CONTRAST TECHNIQUE:  Contiguous axial images were obtained from the base of the skull through the vertex without intravenous contrast. COMPARISON:  CT head 03/30/2020 FINDINGS: Brain: Generalized atrophy. Chronic microvascular ischemic changes in the white matter stable. Chronic infarct in the right external capsule unchanged. Negative for acute infarct, hemorrhage, mass Vascular: Negative for hyperdense vessel Skull: Negative Sinuses/Orbits: Paranasal sinuses clear.  Negative orbit. Other: None IMPRESSION: Atrophy and chronic ischemia. No acute abnormality and no change from the recent CT. Electronically Signed   By: Marlan Palau M.D.   On: 04/09/2020 13:01   MR ANGIO HEAD WO CONTRAST  Result Date: 04/08/2020 CLINICAL DATA:  Right arm weakness EXAM: MRI HEAD WITHOUT CONTRAST MRA HEAD WITHOUT CONTRAST TECHNIQUE: Multiplanar, multiecho pulse sequences of the brain and surrounding structures were obtained without intravenous contrast. Angiographic images of the head were obtained using MRA technique without contrast. COMPARISON:  2015 FINDINGS: MRI HEAD Motion artifact is present. Brain: A punctate focus of cortical reduced diffusion is present along the left superior frontal gyrus. Prominence of the ventricles and sulci reflects generalized parenchymal volume loss, which appears progressed since 2015. Patchy and confluent areas of T2 hyperintensity in the supratentorial and pontine white matter are nonspecific but probably reflect moderate chronic microvascular ischemic changes. 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 Additional chronic small vessel infarct of the left centrum semiovale. A few foci of susceptibility along the left basal ganglia likely reflect chronic microhemorrhages. There is no intracranial mass, mass effect, or edema. There is no hydrocephalus or extra-axial fluid collection. Vascular: Major vessel flow  voids at the skull base are preserved. Skull and  upper cervical spine: Normal marrow signal is preserved. Sinuses/Orbits: Paranasal sinuses are aerated. Orbits are unremarkable. Other: Sella is unremarkable.  Mastoid air cells are clear. MRA HEAD Motion artifact is present. Intracranial internal carotid arteries are patent with atherosclerotic irregularity and potentially moderate to marked stenosis in the paraclinoid regions. Proximal middle and anterior cerebral arteries are patent. Intracranial vertebral arteries, basilar artery, proximal posterior cerebral arteries are patent. IMPRESSION: Motion degraded. Punctate acute cortical infarct of the left superior frontal gyrus. Moderate chronic microvascular ischemic changes. Small chronic infarcts and microhemorrhages as described. No proximal intracranial vessel occlusion. Potential moderate to marked stenosis of the paraclinoid internal carotid arteries bilaterally, which could be overestimated due to artifact. Electronically Signed   By: Guadlupe Spanish M.D.   On: 04/08/2020 16:03   MR BRAIN WO CONTRAST  Result Date: 04/08/2020 CLINICAL DATA:  Right arm weakness EXAM: MRI HEAD WITHOUT CONTRAST MRA HEAD WITHOUT CONTRAST TECHNIQUE: Multiplanar, multiecho pulse sequences of the brain and surrounding structures were obtained without intravenous contrast. Angiographic images of the head were obtained using MRA technique without contrast. COMPARISON:  2015 FINDINGS: MRI HEAD Motion artifact is present. Brain: A punctate focus of cortical reduced diffusion is present along the left superior frontal gyrus. Prominence of the ventricles and sulci reflects generalized parenchymal volume loss, which appears progressed since 2015. Patchy and confluent areas of T2 hyperintensity in the supratentorial and pontine white matter are nonspecific but probably reflect moderate chronic microvascular ischemic changes. 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 Additional  chronic small vessel infarct of the left centrum semiovale. A few foci of susceptibility along the left basal ganglia likely reflect chronic microhemorrhages. There is no intracranial mass, mass effect, or edema. There is no hydrocephalus or extra-axial fluid collection. Vascular: Major vessel flow voids at the skull base are preserved. Skull and upper cervical spine: Normal marrow signal is preserved. Sinuses/Orbits: Paranasal sinuses are aerated. Orbits are unremarkable. Other: Sella is unremarkable.  Mastoid air cells are clear. MRA HEAD Motion artifact is present. Intracranial internal carotid arteries are patent with atherosclerotic irregularity and potentially moderate to marked stenosis in the paraclinoid regions. Proximal middle and anterior cerebral arteries are patent. Intracranial vertebral arteries, basilar artery, proximal posterior cerebral arteries are patent. IMPRESSION: Motion degraded. Punctate acute cortical infarct of the left superior frontal gyrus. Moderate chronic microvascular ischemic changes. Small chronic infarcts and microhemorrhages as described. No proximal intracranial vessel occlusion. Potential moderate to marked stenosis of the paraclinoid internal carotid arteries bilaterally, which could be overestimated due to artifact. Electronically Signed   By: Guadlupe Spanish M.D.   On: 04/08/2020 16:03   ECHOCARDIOGRAM COMPLETE  Result Date: 04/09/2020    ECHOCARDIOGRAM REPORT   Patient Name:   LOYDE ORTH Date of Exam: 04/09/2020 Medical Rec #:  [id number]        Height:       72.0 in Accession #:    [id number]       Weight:       232.4 lb Date of Birth:  15-Feb-1961         BSA:          2.271 m Patient Age:    59 years         BP:           126/84 mmHg Patient Gender: M  HR:           94 bpm. [facility] Procedure: 2D Echo Indications:    stroke 434.91  History:        Patient has prior history of Echocardiogram examinations, most                 recent  03/05/2020. Risk Factors:Hypertension, Diabetes and                 Dyslipidemia. CVA. ETOH Abuse.  Sonographer:    Celene Skeen RDCS (AE) Referring Phys: [phone] DAVID TAT  Sonographer Comments: Image acquisition challenging due to respiratory motion. see comments IMPRESSIONS  1. Left ventricular ejection fraction, by estimation, is 70 to 75%. The left ventricle has normal function. The left ventricle has no regional wall motion abnormalities. There is mild left ventricular hypertrophy. Indeterminate diastolic filling due to E-A fusion.  2. Right ventricular systolic function is normal. The right ventricular size is mildly enlarged.  3. The mitral valve is abnormal. Trivial mitral valve regurgitation.  4. The aortic valve is grossly normal. Aortic valve regurgitation is not visualized.  5. The inferior vena cava is normal in size with greater than 50% respiratory variability, suggesting right atrial pressure of 3 mmHg. Comparison(s): No significant change from prior study. FINDINGS  Left Ventricle: Left ventricular ejection fraction, by estimation, is 70 to 75%. The left ventricle has normal function. The left ventricle has no regional wall motion abnormalities. The left ventricular internal cavity size was normal in size. There is  mild left ventricular hypertrophy. Indeterminate diastolic filling due to E-A fusion. Right Ventricle: The right ventricular size is mildly enlarged. Right vetricular wall thickness was not assessed. Right ventricular systolic function is normal. Left Atrium: Left atrial size was normal in size. Right Atrium: Right atrial size was normal in size. Pericardium: There is no evidence of pericardial effusion. Mitral Valve: The mitral valve is abnormal. There is mild thickening of the mitral valve leaflet(s). Mild mitral annular calcification. Trivial mitral valve regurgitation. Tricuspid Valve: The tricuspid valve is normal in structure. Tricuspid valve regurgitation is mild. Aortic Valve: The  aortic valve is grossly normal. Aortic valve regurgitation is not visualized. Pulmonic Valve: The pulmonic valve was not well visualized. Pulmonic valve regurgitation is trivial. Aorta: The aortic root is normal in size and structure. Venous: The inferior vena cava is normal in size with greater than 50% respiratory variability, suggesting right atrial pressure of 3 mmHg. IAS/Shunts: The interatrial septum was not well visualized.  LEFT VENTRICLE PLAX 2D LVIDd:         4.27 cm  Diastology LVIDs:         3.04 cm  LV e' medial:   8.05 cm/s LV PW:         1.02 cm  LV E/e' medial: 7.9 LV IVS:        1.21 cm LVOT diam:     2.20 cm LV SV:         50 LV SV Index:   22 LVOT Area:     3.80 cm  LEFT ATRIUM             Index       RIGHT ATRIUM           Index LA diam:        3.00 cm 1.32 cm/m  RA Area:     16.70 cm LA Vol (A2C):   37.4 ml 16.47 ml/m RA Volume:  38.60 ml  17.00 ml/m LA Vol (A4C):   62.5 ml 27.52 ml/m LA Biplane Vol: 50.5 ml 22.24 ml/m  AORTIC VALVE LVOT Vmax:   77.70 cm/s LVOT Vmean:  55.100 cm/s LVOT VTI:    0.132 m  AORTA Ao Root diam: 3.20 cm MITRAL VALVE MV Area (PHT): 2.73 cm    SHUNTS MV Decel Time: 278 msec    Systemic VTI:  0.13 m MV E velocity: 63.40 cm/s  Systemic Diam: 2.20 cm MV A velocity: 64.30 cm/s MV E/A ratio:  0.99 Dietrich PatesPaula Ross MD Electronically signed by Dietrich PatesPaula Ross MD Signature Date/Time: 04/09/2020/10:16:54 AM    Final     Labs: BMET Recent Labs  Lab 04/04/20 0550 04/04/20 1600 04/05/20 0453 04/05/20 0453 04/06/20 0548 04/06/20 1123 04/07/20 0416 04/07/20 [id number] 04/07/20 1746 04/08/20 0137 04/08/20 0631 04/08/20 1710 04/09/20 0145 04/09/20 1203 04/10/20 0256  NA 122*   < > 120*   < > 119*   < > 120*  121*   < > 124* 124* 126*  126* 125* 127* 130* 129*  K 3.7  --  3.7  --  3.6  --  3.6  --   --   --  4.0  --  3.8  --  4.2  CL 84*  --  83*  --  83*  --  86*  --   --   --  91*  --  92*  --  95*  CO2 28  --  26  --  25  --  24  --   --   --  26  --  25  --  24   GLUCOSE 111*  --  110*  --  104*  --  160*  --   --   --  114*  --  152*  --  116*  BUN 8  --  9  --  12  --  13  --   --   --  10  --  15  --  12  CREATININE 0.68  --  0.61  --  0.68  0.68  --  0.73  --   --   --  0.72  --  0.72  --  0.79  CALCIUM 8.6*  --  8.6*  --  8.7*  --  8.6*  --   --   --  9.0  --  9.2  --  9.7  PHOS 4.0  --  4.3  --  4.5  --  3.8  --   --   --  4.5  --  4.9*  --  5.3*   < > = values in this interval not displayed.   CBC Recent Labs  Lab 04/04/20 0550 04/06/20 0548  WBC 4.0 5.0  NEUTROABS 2.1  --   HGB 8.6* 8.3*  HCT 26.6* 26.0*  MCV 84.4 83.9  PLT 271 298    Medications:    . aspirin EC  81 mg Oral Daily  . atorvastatin  80 mg Oral q1800  . carvedilol  25 mg Oral BID WC  . Chlorhexidine Gluconate Cloth  6 each Topical Daily  . cholecalciferol  2,000 Units Oral q morning - 10a  . clopidogrel  75 mg Oral Daily  . enoxaparin (LOVENOX) injection  55 mg Subcutaneous Q24H  . folic acid  1 mg Oral Daily  . insulin aspart  0-9 Units Subcutaneous TID WC  . magnesium oxide  800 mg Oral BID  . pantoprazole  40 mg Oral Daily  . phenytoin  200 mg Oral Daily  . phenytoin  300 mg Oral QHS  . Ensure Max Protein  11 oz Oral BID  . senna-docusate  2 tablet Oral QHS  . sodium chloride flush  3 mL Intravenous Q12H  . tamsulosin  0.4 mg Oral QPC supper  . thiamine  100 mg Oral Daily   Zetta Bills, MD 04/10/2020, 8:10 AM

## 2020-04-10 NOTE — Progress Notes (Signed)
STROKE TEAM PROGRESS NOTE   HISTORY OF PRESENT ILLNESS (per record) Brad Graham is an 59 y.o. 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   During his stay at [facility], he was treated for his hyponatremia and acute on chronic diastolic CHF.  On Friday, during his 1 AM assessment, new right sided weakness was noted. An MRI brain was obtained, revealing an acute subcentimeter ischemic infarction in the left frontal gyrus. Stroke work up was initiated and he was transferred to [facility] for further management. He was continued on his daily ASA and Lipitor was increased to 80 mg po qd.  The patient is a poor historian. For example, he states that he had a recent seizure but does not have a long-standing history of such, contrary to documentation in Epic.   INTERVAL HISTORY  I personally reviewed history of presenting illness, electronic medical records and imaging films in PACS.  Initially presented with altered mental status secondary to hyponatremia which improved.  He then developed sudden onset of right-sided weakness and MRI scan shows a tiny punctate left frontal infarct.  He states his weakness is slightly better today.  MRI of the brain shows no large vessel stenosis or occlusion.  Echocardiogram shows normal ejection fraction without cardiac source of embolism.  LDL cholesterol is 82 mg percent and hemoglobin A1c 7.1.  Carotid ultrasound done in May 2021 showed no significant extracranial stenosis.    OBJECTIVE Vitals:    04/09/20 1300 04/09/20 1741 04/09/20 2014 04/10/20 0736  BP: 109/70 121/73 130/73 125/68  Pulse: 88 86 92 91  Resp: 20 18 20 16   Temp: 98.8 F (37.1 C) 97.9 F (36.6 C) 98 F (36.7 C) 98.5 F (36.9 C)  TempSrc: Oral Oral    SpO2: 98% 99% 98% 97%  Weight:      Height:        CBC:  Recent Labs  Lab 04/04/20 0550 04/06/20 0548  WBC 4.0 5.0  NEUTROABS 2.1  --   HGB 8.6* 8.3*  HCT 26.6* 26.0*  MCV 84.4 83.9  PLT 271 298    Basic Metabolic Panel:  Recent Labs  Lab 04/07/20 0416 04/07/20 0826 04/08/20 0631 04/08/20 1710 04/09/20 0145 04/09/20 0145 04/09/20 1203 04/10/20 0256  NA 120*  121*   < > 126*  126*   < > 127*   < > 130* 129*  K 3.6  --  4.0  --  3.8  --   --  4.2  CL 86*  --  91*  --  92*  --   --  95*  CO2 24  --  26  --  25  --   --  24  GLUCOSE 160*  --  114*  --  152*  --   --  116*  BUN 13  --  10  --  15  --   --  12  CREATININE 0.73  --  0.72  --  0.72  --   --  0.79  CALCIUM 8.6*  --  9.0  --  9.2  --   --  9.7  MG 1.5*  --  1.6*  --   --   --   --   --   PHOS 3.8  --  4.5  --  4.9*  --   --  5.3*   < > = values in this interval not displayed.    Lipid Panel:     Component Value Date/Time   CHOL 149 04/09/2020 0145   CHOL 222 (H) 02/02/2020 1604   CHOL CANCELED 08/15/2015 1340   TRIG 114 04/09/2020 0145   TRIG CANCELED 08/15/2015 1340   HDL 44 04/09/2020 0145   HDL 109 02/02/2020 1604   CHOLHDL 3.4 04/09/2020 0145   VLDL 23 04/09/2020 0145   VLDL 34 (H) 05/10/2015 1534   LDLCALC 82 04/09/2020 0145   LDLCALC 85 02/02/2020 1604   HgbA1c:  Lab Results  Component Value Date   HGBA1C 7.1 (H) 04/09/2020   Urine Drug Screen:     Component Value Date/Time   LABOPIA NONE DETECTED 03/30/2020 0958   COCAINSCRNUR NONE DETECTED 03/30/2020 0958   COCAINSCRNUR NONE DETECTED 03/03/2020 0440   LABBENZ POSITIVE (A) 03/30/2020 0958   AMPHETMU NONE DETECTED 03/30/2020 0958   THCU NONE DETECTED 03/30/2020 0958   LABBARB NONE DETECTED 03/30/2020  0958    Alcohol Level     Component Value Date/Time   ETH <10 03/30/2020 0949    IMAGING  CT HEAD WO CONTRAST 04/09/2020 IMPRESSION:  Atrophy and chronic ischemia. No acute abnormality and no change from the recent CT.   MR BRAIN WO CONTRAST MR ANGIO HEAD WO CONTRAST 04/08/2020 IMPRESSION:  Motion degraded. Punctate acute cortical infarct of the left superior frontal gyrus. Moderate chronic microvascular ischemic changes. Small chronic infarcts and microhemorrhages as described. No proximal intracranial vessel occlusion. Potential moderate to marked stenosis of the paraclinoid internal carotid arteries bilaterally, which could be overestimated due to artifact.   ECHOCARDIOGRAM COMPLETE 04/09/2020 IMPRESSIONS   1. Left ventricular ejection fraction, by estimation, is 70 to 75%. The left ventricle has normal function. The left ventricle has no regional wall motion abnormalities. There is mild left ventricular hypertrophy. Indeterminate diastolic filling due to E-A fusion.   2. Right ventricular systolic function is normal. The right ventricular size is mildly enlarged.   3. The mitral valve is abnormal. Trivial mitral valve regurgitation.   4. The aortic valve is grossly normal. Aortic valve regurgitation is not visualized.   5. The inferior vena cava is normal in size with greater than 50% respiratory variability, suggesting right atrial pressure of 3 mmHg. Comparison(s): No significant change from prior study.   Bilateral Carotid Dopplers  01/28/2020 RIGHT CAROTID ARTERY: Echogenic plaque at the right carotid bulb. External carotid artery is patent with normal waveform. Normal waveforms and velocities in the internal carotid artery. RIGHT VERTEBRAL ARTERY: Antegrade flow and normal waveform in the right vertebral artery. LEFT CAROTID ARTERY: Small amount of echogenic and heterogeneous plaque at the left carotid bulb. External carotid artery is patent with normal waveform. Normal  waveforms and velocities in the internal carotid artery. LEFT VERTEBRAL ARTERY: Antegrade flow and normal waveform in the left vertebral artery. 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 Estimated degree of stenosis in the internal carotid arteries is less than 50% bilaterally.   ECG - 03/30/20 - ST rate 103 BPM. Borderline prolonged QT interval (See cardiology reading for complete details)  EEG 03/07/2020 ABNORMALITY -Continued slow, generalized -Excessive beta, generalized IMPRESSION: This study is suggestive of moderate diffuse encephalopathy, nonspecific etiology. The  excessive beta activity seen in the background is most likely due to the effect of benzodiazepine and is a benign EEG pattern. No seizures or epileptiform discharges were seen throughout the recording.   PHYSICAL EXAM Blood pressure 125/68, pulse 91, temperature 98.5 F (36.9 C), resp. rate 16, height 6' (1.829 m), weight 105.4 kg, SpO2 97 %. Frail middle-age male not in distress. . Afebrile. Head is nontraumatic. Neck is supple without bruit.    Cardiac exam no murmur or gallop. Lungs are clear to auscultation. Distal pulses are well felt. Neurological Exam :  He is awake alert oriented to time and place.  Diminished attention, distraction recall.  Speech is slow but clear no dysarthria.  Follows simple midline and 1 and some two-step commands.  Extraocular movements are full range without nystagmus.  Blinks to threat bilaterally.  Mild left lower facial asymmetry.  Tongue midline.  Motor system exam shows mild right upper extremity drift.  Mild weakness of right grip intrinsic hand muscles and wrist muscles.  Mild proximal bilateral lower extremity weakness.  Tone slightly increased on the left than the right.  Sensation appears preserved bilaterally.  Gait not tested.  Right plantar is downgoing left is equivocal.    ASSESSMENT/PLAN Mr. Marcha DuttonDwayne A Graham is a 59 y.o. 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  Likely small vessel disease  Resultant right-sided weakness  Code Stroke CT Head - not ordered     CT head - Atrophy and chronic ischemia. No acute abnormality and no change from the recent CT.   MRI head - Motion degraded. Punctate acute cortical infarct of the left superior frontal gyrus. Moderate chronic microvascular ischemic changes. Small chronic infarcts and microhemorrhages as described.   MRA head - No proximal intracranial vessel occlusion. Potential moderate to marked stenosis of the paraclinoid internal carotid arteries bilaterally, which could be overestimated due to artifact  CTA H&N - not ordered  CT Perfusion - not ordered  Carotid Doppler - 01/28/20 - Mild atherosclerotic disease involving the carotid bulbs. Estimated degree of stenosis in the internal carotid arteries is less than 50% bilaterally.  2D Echo - EF - 70 to 75%. No cardiac source of emboli identified.   Sars Corona Virus 2 - negative  LDL - 82  HgbA1c - 7.1  UDS - 03/30/20 - benzodiazepine  VTE prophylaxis - Lovenox Diet  Diet Order            Diet Carb Modified Fluid consistency: Thin; Room service appropriate? Yes  Diet effective now                 aspirin 81 mg daily prior to admission, now on aspirin 81 mg daily and clopidogrel 75 mg daily into 3 weeks followed by  Plavix alone  Patient counseled to be compliant with his antithrombotic medications  Ongoing aggressive stroke risk factor management  Therapy recommendations:  pending  Disposition:  Pending  Hypertension  Home BP meds: Coreg ;  Apresoline ; Norvasc  Current BP meds: Coreg  Stable . Permissive hypertension (OK if < 220/120) but gradually normalize in 5-7 days  . Long-term BP goal normotensive  Hyperlipidemia  Home Lipid lowering medication: Lipitor 20 mg daily  LDL 82, goal < 70  Current lipid lowering medication: Lipitor 80 mg daily   Continue statin at discharge  Diabetes  Home diabetic meds: metformin  Current diabetic meds: insulin  HgbA1c 7.1, goal < 7.0 Recent Labs    04/09/20 1215 04/09/20 1826 04/10/20 0746  GLUCAP 121* 104* 90    Other Stroke Risk Factors  Advanced age  Cigarette smoker - advised to stop smoking  Previous ETOH abuse  Obesity, Body mass index is 31.51 kg/m., recommend weight loss, diet and exercise as appropriate   Family hx stroke (mother)   Hx stroke/TIA  CHF  Other Active Problems  Code status - DNR  Seizure disorder - Dilantin - Phenytoin, Free level 1.9 on 03/08/20 - consider rechecking  Hyponatremia - 120->121->126->127-.130->129 - improving - Nephrology following   Anemia - Hgb - 8.3  Hospital day # 11  Patient developed sudden onset of right-sided weakness due to punctate left frontal infarct etiology unclear as to small vessel disease as he clearly has evidence of that in the MRI but could be embolic but I am not sure patient is a good long-term candidate for anticoagulation given history of heavy alcohol use and alcohol withdrawal seizures hence will not pursue loop recorder and TEE.  Recommend aspirin and Plavix for 3 weeks followed by Plavix alone.  Aggressive risk factor modification.  Greater than 50% time during this 35-minute   visit was spent on counseling and coordination of care and planning treatment  and discussion with care team.  Discussed with Dr. Assunta Curtis, MD To contact Stroke Continuity provider, please refer to [email or web address]. After hours, contact General Neurology

## 2020-04-10 NOTE — Progress Notes (Signed)
[facility]                                                                              Patient Demographics  Brad Graham, is a 59 y.o. male, DOB - 1961/02/04, QJF:[id number]  Admit date - 03/30/2020   Admitting Physician [name], MD  Outpatient Primary MD for the patient is [name], NP  Outpatient specialists:   LOS - 11  days   Medical records reviewed and are as summarized below:    Chief Complaint  Patient presents with  . Altered Mental Status       Brief summary   58 y.o.malewith medical history significantfor a recent hospitalization at [facility](6/9-6/21)for cardiac arrest requiring intubation acute renal failure aspiration pneumonia who was treated in the ICU for several days and subsequently extubated and eventually discharged to [facility] ([place] 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 He has a chronic seizure disorder and is maintained on Dilantin. He has type 2 diabetes mellitus and hypertension.  He was admitted with acute mental status changes and severe hyponatremia with sodium of 109.His sodium levels were being monitored at the SNF reportedly and they had recommended giving him IV fluids however the patient refused and he was given oral sodium tablets. He had been taking those reportedly during the last week. As his Na slowly improved, so did his mentation. In am of 04/08/20, patient woke up with some right arm weakness.  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  He was [name] for neurology evaluation.     Assessment & Plan      Hyponatremia -Initially improved with IV Lasix, then trended down again.  Nephrology consulted -Negative balance of 24 L, IV fluids discontinued per renal -Received tolvaptan 7.5 mg on  7/15, 15 mg on 7/16, nephrology to redose 15 mg on 7/17 today -Sodium stable around 130  Acute on chronic diastolic CHF -2D echo showed EF of 70-75% with mild LVH, indeterminate diastolic filling -Responded to IV Lasix, has continued to diurese without Lasix, last dose on 7/11 -Fluid management per renal, negative balance of 24 L  Right hemiparesis/acute ischemic stroke -Patient was noted to have some right-sided weakness 04/09/2019  -MRI brain was obtained which showed acute infarct in the left frontal gyrus. 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 Estimated degree of stenosis in the internal carotid arteries is less than 50% bilaterally. -asa plus plavix -on Lipitor 20 mg daily PTA, increased to 80 mg daily   E. coli UTI Had 4 days of IV ceftriaxone, last dose on 04/02/2020  Acute metabolic encephalopathy -Likely due to UTI, hyponatremia, overall improved, near his baseline  History of seizure disorder Continue Dilantin  History of alcohol use Abstinent for past several weeks, resident at [facility]  Diabetes mellitus type 2 -Hemoglobin A1c  7.1 -Placed on sliding scale insulin, carb modified diet  Essential hypertension -Continue Coreg Amlodipine, hydralazine on hold  Anemia of chronic disease -trend  Obesity Estimated body mass index is 31.51 kg/m as calculated from the following:   Height as of this encounter: 6' (1.829 m).   Weight as of this encounter: 105.4 kg.  Code Status: DNR DVT Prophylaxis:  Lovenox  Family Communication: Discussed all imaging results, lab results, explained to the patient    Disposition Plan:     Status is: Inpatient  Remains inpatient appropriate because:Inpatient level of care appropriate due to severity of illness   Dispo: The patient is from: Home               Anticipated d/c is to: TBD              Anticipated d/c date is: 2 days              Patient currently is not medically stable to d/c.      Time Spent in minutes      Procedures:  None  Consultants:   Nephrology Neurology  Antimicrobials:   Anti-infectives (From admission, onward)   Start     Dose/Rate Route Frequency Ordered Stop   03/30/20 1830  cefTRIAXone (ROCEPHIN) 1 g in sodium chloride 0.9 % 100 mL IVPB        1 g 200 mL/hr over 30 Minutes Intravenous Every 24 hours 03/30/20 1800 04/02/20 1808         Medications  Scheduled Meds: . aspirin EC  81 mg Oral Daily  . atorvastatin  80 mg Oral q1800  . carvedilol  25 mg Oral BID WC  . Chlorhexidine Gluconate Cloth  6 each Topical Daily  . cholecalciferol  2,000 Units Oral q morning - 10a  . clopidogrel  75 mg Oral Daily  . enoxaparin (LOVENOX) injection  55 mg Subcutaneous Q24H  . folic acid  1 mg Oral Daily  . furosemide  20 mg Oral Daily  . insulin aspart  0-9 Units Subcutaneous TID WC  . magnesium oxide  800 mg Oral BID  . pantoprazole  40 mg Oral Daily  . phenytoin  200 mg Oral Daily  . phenytoin  300 mg Oral QHS  . Ensure Max Protein  11 oz Oral BID  . senna-docusate  2 tablet Oral QHS  . sodium chloride flush  3 mL Intravenous Q12H  . tamsulosin  0.4 mg Oral QPC supper  . thiamine  100 mg Oral Daily   Continuous Infusions: PRN Meds:.acetaminophen **OR** acetaminophen, bisacodyl, chlorproMAZINE, ondansetron **OR** ondansetron (ZOFRAN) IV      Subjective:   Feeling better, no overnight events  Objective:   Vitals:   04/09/20 1300 04/09/20 1741 04/09/20 2014 04/10/20 0736  BP: 109/70 121/73 130/73 125/68  Pulse: 88 86 92 91  Resp: Temp: 98.8 F (37.1 C) 97.9 F (36.6 C) 98 F (36.7 C) 98.5 F (36.9 C)  TempSrc: Oral Oral    SpO2: 98% 99% 98% 97%  Weight:      Height:        Intake/Output Summary (Last 24 hours) at 04/10/2020 1118 Last data filed at  04/09/2020 2300 Gross per 24 hour  Intake --  Output 600 ml  Net -600 ml     Wt Readings from Last 3 Encounters:  04/09/20 105.4 kg  03/05/20 119.5 kg  02/16/20 107.7 kg     Exam  General:  Appearance:    Obese male in no acute distress     Lungs:     Clear to auscultation bilaterally, respirations unlabored  Heart:    Normal heart rate. Normal rhythm. No murmurs, rubs, or gallops.   MS:   All extremities are intact.   Neurologic:   Awake, alert, oriented x 3. Mild right sided weakness   Data Reviewed:  I have personally reviewed following labs and imaging studies  Micro Results Recent Results (from the past 240 hour(s))  Culture, blood (Routine X 2) w Reflex to ID Panel     Status: None   Collection Time: 03/31/20  5:20 PM   Specimen: BLOOD  Result Value Ref Range Status   Specimen Description BLOOD LEFT ANTECUBITAL  Final   Special Requests   Final    BOTTLES DRAWN AEROBIC AND ANAEROBIC Blood Culture adequate volume   Culture   Final    NO GROWTH 5 DAYS Performed at [facility], [address] [place], [place] [address]    Report Status 04/05/2020 FINAL  Final  Culture, blood (Routine X 2) w Reflex to ID Panel     Status: None   Collection Time: 03/31/20  5:28 PM   Specimen: BLOOD  Result Value Ref Range Status   Specimen Description BLOOD BLOOD RIGHT WRIST  Final   Special Requests   Final    AEROBIC BOTTLE ONLY Blood Culture results may not be optimal due to an inadequate volume of blood received in culture bottles   Culture   Final    NO GROWTH 5 DAYS Performed at [facility], [address], [place], [place] [address]    Report Status 04/05/2020 FINAL  Final  SARS Coronavirus 2 by RT PCR (hospital order, performed in [facility] hospital lab) Nasopharyngeal Nasopharyngeal Swab     Status: None   Collection Time: 04/08/20  7:30 PM   Specimen: Nasopharyngeal Swab  Result Value Ref Range Status   SARS Coronavirus 2 NEGATIVE NEGATIVE Final    Comment:  (NOTE) SARS-CoV-2 target nucleic acids are NOT DETECTED.  The SARS-CoV-2 RNA is generally detectable in upper and lower respiratory specimens during the acute phase of infection. The lowest concentration of SARS-CoV-2 viral copies this assay can detect is 250 copies / mL. A negative result does not preclude SARS-CoV-2 infection and should not be used as the sole basis for treatment or other patient management decisions.  A negative result may occur with improper specimen collection / handling, submission of specimen other than nasopharyngeal swab, presence of viral mutation(s) within the areas targeted by this assay, and inadequate number of viral copies (<250 copies / mL). A negative result must be combined with clinical observations, patient history, and epidemiological information.  Fact Sheet for Patients:   BoilerBrush.com.cy  Fact Sheet for Healthcare Providers: https://pope.com/  This test is not yet approved or  cleared by the [place] FDA and has been authorized for detection and/or diagnosis of SARS-CoV-2 by FDA under an Emergency Use Authorization (EUA).  This EUA will remain in effect (meaning this test can be used) for the duration of the COVID-19 declaration under Section 564(b)(1) of the Act, 21 U.S.C. section 360bbb-3(b)(1), unless the authorization is terminated or revoked sooner.  Performed at [facility], [address] [place], [place] [address]     Radiology Reports CT HEAD WO CONTRAST  Result Date: 04/09/2020 CLINICAL DATA:  Neuro deficit subacute.  Unresponsive. EXAM: CT HEAD WITHOUT CONTRAST TECHNIQUE: Contiguous axial images were obtained  from the base of the skull through the vertex without intravenous contrast. COMPARISON:  CT head 03/30/2020 FINDINGS: Brain: Generalized atrophy. Chronic microvascular ischemic changes in the white matter stable. Chronic infarct in the right external capsule unchanged.  Negative for acute infarct, hemorrhage, mass Vascular: Negative for hyperdense vessel Skull: Negative Sinuses/Orbits: Paranasal sinuses clear.  Negative orbit. Other: None IMPRESSION: Atrophy and chronic ischemia. No acute abnormality and no change from the recent CT. Electronically Signed   By: Marlan Palau M.D.   On: 04/09/2020 13:01   CT Head Wo Contrast  Result Date: 03/30/2020 CLINICAL DATA:  Encephalopathy. Additional provided: Elevated sodium levels, lethargic. EXAM: CT HEAD WITHOUT CONTRAST TECHNIQUE: Contiguous axial images were obtained from the base of the skull through the vertex without intravenous contrast. COMPARISON:  Head CT examination 03/03/2020 and earlier FINDINGS: Brain: The examination is mild to moderately motion degraded. Stable, mild generalized parenchymal atrophy. 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 Redemonstrated chronic infarct within the right external capsule. Unchanged moderate patchy hypodensity within the cerebral white matter which is nonspecific, but consistent with chronic small vessel ischemic disease. There is no acute intracranial hemorrhage. No demarcated cortical infarct is identified. No extra-axial fluid collection. No evidence of intracranial mass. No midline shift. Incidentally noted cavum septum pellucidum and cavum vergae. Vascular: No hyperdense vessel.  Atherosclerotic calcifications. Skull: Normal. Negative for fracture or focal lesion. Sinuses/Orbits: Visualized orbits show no acute finding. Mild ethmoid sinus mucosal thickening. Moderate right maxillary sinus mucosal thickening. No significant mastoid effusion. IMPRESSION: Mild-to-moderate motion degradation. No evidence of acute intracranial abnormality. Stable generalized parenchymal atrophy and chronic small vessel ischemic disease. Paranasal sinus mucosal thickening as described, most notably right maxillary. Electronically Signed   By: Jackey Loge  DO   On: 03/30/2020 10:34   MR ANGIO HEAD WO CONTRAST  Result Date: 04/08/2020 CLINICAL DATA:  Right arm weakness EXAM: MRI HEAD WITHOUT CONTRAST MRA HEAD WITHOUT CONTRAST TECHNIQUE: Multiplanar, multiecho pulse sequences of the brain and surrounding structures were obtained without intravenous contrast. Angiographic images of the head were obtained using MRA technique without contrast. COMPARISON:  2015 FINDINGS: MRI HEAD Motion artifact is present. Brain: A punctate focus of cortical reduced diffusion is present along the left superior frontal gyrus. Prominence of the ventricles and sulci reflects generalized parenchymal volume loss, which appears progressed since 2015. Patchy and confluent areas of T2 hyperintensity in the supratentorial and pontine white matter are nonspecific but probably reflect moderate chronic microvascular ischemic changes. 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 Additional chronic small vessel infarct of the left centrum semiovale. A few foci of susceptibility along the left basal ganglia likely reflect chronic microhemorrhages. There is no intracranial mass, mass effect, or edema. There is no hydrocephalus or extra-axial fluid collection. Vascular: Major vessel flow voids at the skull base are preserved. Skull and upper cervical spine: Normal marrow signal is preserved. Sinuses/Orbits: Paranasal sinuses are aerated. Orbits are unremarkable. Other: Sella is unremarkable.  Mastoid air cells are clear. MRA HEAD Motion artifact is present. Intracranial internal carotid arteries are patent with atherosclerotic irregularity and potentially moderate to marked stenosis in the paraclinoid regions. Proximal middle and anterior cerebral arteries are patent. Intracranial vertebral arteries, basilar artery, proximal posterior cerebral arteries are patent. IMPRESSION: Motion degraded. Punctate acute cortical infarct of the left superior  frontal gyrus. Moderate chronic microvascular ischemic changes. Small chronic infarcts and microhemorrhages as described. No proximal intracranial vessel  occlusion. Potential moderate to marked stenosis of the paraclinoid internal carotid arteries bilaterally, which could be overestimated due to artifact. Electronically Signed   By: Guadlupe Spanish M.D.   On: 04/08/2020 16:03   MR BRAIN WO CONTRAST  Result Date: 04/08/2020 CLINICAL DATA:  Right arm weakness EXAM: MRI HEAD WITHOUT CONTRAST MRA HEAD WITHOUT CONTRAST TECHNIQUE: Multiplanar, multiecho pulse sequences of the brain and surrounding structures were obtained without intravenous contrast. Angiographic images of the head were obtained using MRA technique without contrast. COMPARISON:  2015 FINDINGS: MRI HEAD Motion artifact is present. Brain: A punctate focus of cortical reduced diffusion is present along the left superior frontal gyrus. Prominence of the ventricles and sulci reflects generalized parenchymal volume loss, which appears progressed since 2015. Patchy and confluent areas of T2 hyperintensity in the supratentorial and pontine white matter are nonspecific but probably reflect moderate chronic microvascular ischemic changes. 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 Additional chronic small vessel infarct of the left centrum semiovale. A few foci of susceptibility along the left basal ganglia likely reflect chronic microhemorrhages. There is no intracranial mass, mass effect, or edema. There is no hydrocephalus or extra-axial fluid collection. Vascular: Major vessel flow voids at the skull base are preserved. Skull and upper cervical spine: Normal marrow signal is preserved. Sinuses/Orbits: Paranasal sinuses are aerated. Orbits are unremarkable. Other: Sella is unremarkable.  Mastoid air cells are clear. MRA HEAD Motion artifact is present. Intracranial internal carotid arteries are  patent with atherosclerotic irregularity and potentially moderate to marked stenosis in the paraclinoid regions. Proximal middle and anterior cerebral arteries are patent. Intracranial vertebral arteries, basilar artery, proximal posterior cerebral arteries are patent. IMPRESSION: Motion degraded. Punctate acute cortical infarct of the left superior frontal gyrus. Moderate chronic microvascular ischemic changes. Small chronic infarcts and microhemorrhages as described. No proximal intracranial vessel occlusion. Potential moderate to marked stenosis of the paraclinoid internal carotid arteries bilaterally, which could be overestimated due to artifact. Electronically Signed   By: Guadlupe Spanish M.D.   On: 04/08/2020 16:03   DG CHEST PORT 1 VIEW  Result Date: 04/01/2020 CLINICAL DATA:  Fever. EXAM: PORTABLE CHEST 1 VIEW COMPARISON:  03/30/2020. FINDINGS: Mediastinum and hilar structures normal. Heart size stable. Low lung volumes. Mild left base atelectasis/infiltrate. No pleural effusion or pneumothorax. No acute bony abnormality. IMPRESSION: Low lung volumes with mild left base atelectasis/infiltrate. Similar findings noted on prior exam. Electronically Signed   By: Maisie Fus  Register   On: 04/01/2020 05:27   DG Chest Portable 1 View  Result Date: 03/30/2020 CLINICAL DATA:  Altered mental status. EXAM: PORTABLE CHEST 1 VIEW COMPARISON:  Prior chest radiograph 03/02/2020 FINDINGS: Mild cardiomegaly, unchanged. Minimal left basilar atelectasis. No appreciable airspace consolidation or frank pulmonary edema. No evidence of pleural effusion or pneumothorax. No acute bony abnormality identified. IMPRESSION: Minimal left basilar atelectasis.  Lungs otherwise clear. Mild cardiomegaly, unchanged. Electronically Signed   By: Jackey Loge DO   On: 03/30/2020 10:36   ECHOCARDIOGRAM COMPLETE  Result Date: 04/09/2020    ECHOCARDIOGRAM REPORT   Patient Name:   Marcha Dutton Date of Exam: 04/09/2020 Medical Rec #:   [id number]        Height:       72.0 in Accession #:    [id number]       Weight:       232.4 lb Date of Birth:  05/04/1961         BSA:  2.271 m Patient Age:    59 years         BP:           126/84 mmHg Patient Gender: M                HR:           94 bpm. [facility] Procedure: 2D Echo Indications:    stroke 434.91  History:        Patient has prior history of Echocardiogram examinations, most                 recent 03/05/2020. Risk Factors:Hypertension, Diabetes and                 Dyslipidemia. CVA. ETOH Abuse.  Sonographer:    Celene Skeen RDCS (AE) Referring Phys: [phone] DAVID TAT  Sonographer Comments: Image acquisition challenging due to respiratory motion. see comments IMPRESSIONS  1. Left ventricular ejection fraction, by estimation, is 70 to 75%. The left ventricle has normal function. The left ventricle has no regional wall motion abnormalities. There is mild left ventricular hypertrophy. Indeterminate diastolic filling due to E-A fusion.  2. Right ventricular systolic function is normal. The right ventricular size is mildly enlarged.  3. The mitral valve is abnormal. Trivial mitral valve regurgitation.  4. The aortic valve is grossly normal. Aortic valve regurgitation is not visualized.  5. The inferior vena cava is normal in size with greater than 50% respiratory variability, suggesting right atrial pressure of 3 mmHg. Comparison(s): No significant change from prior study. FINDINGS  Left Ventricle: Left ventricular ejection fraction, by estimation, is 70 to 75%. The left ventricle has normal function. The left ventricle has no regional wall motion abnormalities. The left ventricular internal cavity size was normal in size. There is  mild left ventricular hypertrophy. Indeterminate diastolic filling due to E-A fusion. Right Ventricle: The right ventricular size is mildly enlarged. Right vetricular wall thickness was not assessed. Right ventricular systolic function is normal. Left  Atrium: Left atrial size was normal in size. Right Atrium: Right atrial size was normal in size. Pericardium: There is no evidence of pericardial effusion. Mitral Valve: The mitral valve is abnormal. There is mild thickening of the mitral valve leaflet(s). Mild mitral annular calcification. Trivial mitral valve regurgitation. Tricuspid Valve: The tricuspid valve is normal in structure. Tricuspid valve regurgitation is mild. Aortic Valve: The aortic valve is grossly normal. Aortic valve regurgitation is not visualized. Pulmonic Valve: The pulmonic valve was not well visualized. Pulmonic valve regurgitation is trivial. Aorta: The aortic root is normal in size and structure. Venous: The inferior vena cava is normal in size with greater than 50% respiratory variability, suggesting right atrial pressure of 3 mmHg. IAS/Shunts: The interatrial septum was not well visualized.  LEFT VENTRICLE PLAX 2D LVIDd:         4.27 cm  Diastology LVIDs:         3.04 cm  LV e' medial:   8.05 cm/s LV PW:         1.02 cm  LV E/e' medial: 7.9 LV IVS:        1.21 cm LVOT diam:     2.20 cm LV SV:         50 LV SV Index:   22 LVOT Area:     3.80 cm  LEFT ATRIUM             Index       RIGHT  ATRIUM           Index LA diam:        3.00 cm 1.32 cm/m  RA Area:     16.70 cm LA Vol (A2C):   37.4 ml 16.47 ml/m RA Volume:   38.60 ml  17.00 ml/m LA Vol (A4C):   62.5 ml 27.52 ml/m LA Biplane Vol: 50.5 ml 22.24 ml/m  AORTIC VALVE LVOT Vmax:   77.70 cm/s LVOT Vmean:  55.100 cm/s LVOT VTI:    0.132 m  AORTA Ao Root diam: 3.20 cm MITRAL VALVE MV Area (PHT): 2.73 cm    SHUNTS MV Decel Time: 278 msec    Systemic VTI:  0.13 m MV E velocity: 63.40 cm/s  Systemic Diam: 2.20 cm MV A velocity: 64.30 cm/s MV E/A ratio:  0.99 Dietrich Pates MD Electronically signed by Dietrich Pates MD Signature Date/Time: 04/09/2020/10:16:54 AM    Final     Lab Data:  CBC: Recent Labs  Lab 04/04/20 0550 04/06/20 0548  WBC 4.0 5.0  NEUTROABS 2.1  --   HGB 8.6* 8.3*  HCT  26.6* 26.0*  MCV 84.4 83.9  PLT 271 298   Basic Metabolic Panel: Recent Labs  Lab 04/04/20 0550 04/04/20 1600 04/05/20 0453 04/05/20 0453 04/06/20 0548 04/06/20 1123 04/07/20 0416 04/07/20 0826 04/08/20 0631 04/08/20 1710 04/09/20 0145 04/09/20 1203 04/10/20 0256  NA 122*   < > 120*   < > 119*   < > 120*  121*   < > 126*  126* 125* 127* 130* 129*  K 3.7  --  3.7   < > 3.6  --  3.6  --  4.0  --  3.8  --  4.2  CL 84*  --  83*   < > 83*  --  86*  --  91*  --  92*  --  95*  CO2 28  --  26   < > 25  --  24  --  26  --  25  --  24  GLUCOSE 111*  --  110*   < > 104*  --  160*  --  114*  --  152*  --  116*  BUN 8  --  9   < > 12  --  13  --  10  --  15  --  12  CREATININE 0.68  --  0.61   < > 0.68  0.68  --  0.73  --  0.72  --  0.72  --  0.79  CALCIUM 8.6*  --  8.6*   < > 8.7*  --  8.6*  --  9.0  --  9.2  --  9.7  MG 1.6*  --  1.7  --  1.5*  --  1.5*  --  1.6*  --   --   --   --   PHOS 4.0  --  4.3   < > 4.5  --  3.8  --  4.5  --  4.9*  --  5.3*   < > = values in this interval not displayed.   GFR: Estimated Creatinine Clearance: 121.7 mL/min (by C-G formula based on SCr of 0.79 mg/dL). Liver Function Tests: Recent Labs  Lab 04/06/20 0548 04/07/20 0416 04/08/20 0631 04/09/20 0145 04/10/20 0256  ALBUMIN 2.6* 2.6* 2.6* 2.8* 2.4*   No results for input(s): LIPASE, AMYLASE in the last 168 hours. No results for input(s): AMMONIA in the last 168 hours. Coagulation Profile:  No results for input(s): INR, PROTIME in the last 168 hours. Cardiac Enzymes: No results for input(s): CKTOTAL, CKMB, CKMBINDEX, TROPONINI in the last 168 hours. BNP (last 3 results) No results for input(s): PROBNP in the last 8760 hours. HbA1C: Recent Labs    04/09/20 0145  HGBA1C 7.1*   CBG: Recent Labs  Lab 04/09/20 0716 04/09/20 1111 04/09/20 1215 04/09/20 1826 04/10/20 0746  GLUCAP 120* 168* 121* 104* 90   Lipid Profile: Recent Labs    04/09/20 0145  CHOL 149  HDL 44  LDLCALC 82    TRIG 114  CHOLHDL 3.4   Thyroid Function Tests: No results for input(s): TSH, T4TOTAL, FREET4, T3FREE, THYROIDAB in the last 72 hours. Anemia Panel: Recent Labs    04/07/20 1746  FERRITIN 456*  TIBC 220*  IRON 27*   Urine analysis:    Component Value Date/Time   COLORURINE YELLOW 04/04/2020 1650   APPEARANCEUR CLOUDY (A) 04/04/2020 1650   APPEARANCEUR Clear 05/02/2014 0134   LABSPEC 1.009 04/04/2020 1650   LABSPEC 1.004 05/02/2014 0134   PHURINE 8.0 04/04/2020 1650   GLUCOSEU NEGATIVE 04/04/2020 1650   GLUCOSEU Negative 05/02/2014 0134   HGBUR NEGATIVE 04/04/2020 1650   BILIRUBINUR NEGATIVE 04/04/2020 1650   BILIRUBINUR Negative 05/02/2014 0134   KETONESUR NEGATIVE 04/04/2020 1650   PROTEINUR NEGATIVE 04/04/2020 1650   NITRITE NEGATIVE 04/04/2020 1650   LEUKOCYTESUR NEGATIVE 04/04/2020 1650   LEUKOCYTESUR Negative 05/02/2014 0134     Derris Millan U Tekesha Almgren DO [facility] 04/10/2020, 11:18 AM   Call night coverage person covering after 7pm

## 2020-04-11 LAB — CBC
HCT: 28.8 % — ABNORMAL LOW (ref 39.0–52.0)
Hemoglobin: 8.9 g/dL — ABNORMAL LOW (ref 13.0–17.0)
MCH: 25.9 pg — ABNORMAL LOW (ref 26.0–34.0)
MCHC: 30.9 g/dL (ref 30.0–36.0)
MCV: 84 fL (ref 80.0–100.0)
Platelets: 403 10*3/uL — ABNORMAL HIGH (ref 150–400)
RBC: 3.43 MIL/uL — ABNORMAL LOW (ref 4.22–5.81)
RDW: 14.8 % (ref 11.5–15.5)
WBC: 4.5 10*3/uL (ref 4.0–10.5)
nRBC: 0 % (ref 0.0–0.2)

## 2020-04-11 LAB — RENAL FUNCTION PANEL
Albumin: 2.5 g/dL — ABNORMAL LOW (ref 3.5–5.0)
Anion gap: 12 (ref 5–15)
BUN: 12 mg/dL (ref 8–23)
CO2: 23 mmol/L (ref 22–32)
Calcium: 9.6 mg/dL (ref 8.9–10.3)
Chloride: 92 mmol/L — ABNORMAL LOW (ref 98–111)
Creatinine, Ser: 0.75 mg/dL (ref 0.61–1.24)
GFR calc Af Amer: >60 mL/min (ref >60)
GFR calc non Af Amer: >60 mL/min (ref >60)
Glucose, Bld: 105 mg/dL — ABNORMAL HIGH (ref 70–99)
Phosphorus: 5.3 mg/dL — ABNORMAL HIGH (ref 2.5–4.6)
Potassium: 4 mmol/L (ref 3.5–5.1)
Sodium: 127 mmol/L — ABNORMAL LOW (ref 135–145)

## 2020-04-11 LAB — GLUCOSE, CAPILLARY
Glucose-Capillary: 105 mg/dL — ABNORMAL HIGH (ref 70–99)
Glucose-Capillary: 154 mg/dL — ABNORMAL HIGH (ref 70–99)
Glucose-Capillary: 87 mg/dL (ref 70–99)
Glucose-Capillary: 121 mg/dL — ABNORMAL HIGH (ref 70–99)

## 2020-04-11 NOTE — Evaluation (Signed)
Physical Therapy Evaluation Patient Details Name: Brad Graham MRN: [id number] DOB: 05-31-1961 Today's Date: 04/11/2020   History of Present Illness  59 y.o. male with medical history significant for a recent hospitalization at [facility] (6/9-6/21) for cardiac arrest requiring intubation acute renal failure aspiration pneumonia who was treated in the ICU for several days and subsequently extubated and eventually discharged to [facility]. Admitted to [facility] on 7/7 for AMS secondary to hyponatremia, transferred to [facility] on 7/16 for acute L frontal gyrus infarct. PMH includes CVA with L hemiparesis, ETOH abuse, HTN, HLD, epilepsy, DM.  Clinical Impression   Pt presents with LE weakness L>R, impaired safety awareness and command following, impaired standing balance with heavy posterior leaning, difficulty mobilizing, and decreased activity tolerance. Pt to benefit from acute PT to address deficits. Pt required min-mod assist for bed mobility and transfer OOB to recliner, respectively, with very poor safety awareness exhibited throughout. PT recommending return to SNF post-acutely from a mobility standpoint. PT to progress mobility as tolerated, and will continue to follow acutely.      Follow Up Recommendations SNF    Equipment Recommendations  Other (comment) (defer)    Recommendations for Other Services       Precautions / Restrictions Precautions Precautions: Fall Restrictions Weight Bearing Restrictions: No      Mobility  Bed Mobility Overal bed mobility: Needs Assistance Bed Mobility: Supine to Sit     Supine to sit: Min assist;HOB elevated     General bed mobility comments: Min assist for LE management to EOB, steadying trunk when elevating trunk off bed. Pt with use of increased time and bedrails to perform.  Transfers Overall transfer level: Needs assistance Equipment used: Rolling walker (2 wheeled) Transfers: Sit to/from [employer] Sit to Stand: Mod assist;From elevated surface Stand pivot transfers: Mod assist;From elevated surface       General transfer comment: Mod assist for power up, hip extension, steadying, and correcting heavy posterior leaning. Mod assist for stand pivot to recliner for guiding pt and RW, steadying, pt with uncontrolled descent to recliner stating "I have to sit" and sat on armrest of chair even with max multimodal cuing to not sit yet.  Ambulation/Gait                Stairs            Wheelchair Mobility    Modified Rankin (Stroke Patients Only) Modified Rankin (Stroke Patients Only) Pre-Morbid Rankin Score: Moderately severe disability Modified Rankin: Moderately severe disability     Balance Overall balance assessment: Needs assistance Sitting-balance support: No upper extremity supported Sitting balance-Leahy Scale: Fair   Postural control: Posterior lean Standing balance support: Bilateral upper extremity supported;During functional activity Standing balance-Leahy Scale: Poor Standing balance comment: reliant on external support in standing, heavy posterior leaning requiring PT assist to correct                             Pertinent Vitals/Pain Pain Assessment: No/denies pain    Home Living Family/patient expects to be discharged to:: Skilled nursing facility                 Additional Comments: From previous documentation (May, 2021), confirmed by patient/family in room: Pt has his own bedroom on the second floor of the boarding house.  He uses a shared bathroom with tub/shower unit.  Pt reports there are grab bars by the toilet.  Pt reports getting meals from a food pantry next to the group home.    Prior Function Level of Independence: Needs assistance   Gait / Transfers Assistance Needed: Pt reports working with therapy at SNF, mostly transfers and short distance ambulation with RW. Pt reports he has not gotten OOB since  hospitalization starting 7/7.  ADL's / Homemaking Assistance Needed: At baseline, pt is Mod I for ADLs. currently requires some feeding assist, unsure of ability to complete other ADLs.  Comments: Pt reports Modified Independent with BADLs. Pt denies using AD for mobility, but per chart, he uses SPC. Pt reports there is a Electrical engineer" at boarding house that assists with meals.      Hand Dominance   Dominant Hand: Right    Extremity/Trunk Assessment   Upper Extremity Assessment Upper Extremity Assessment: Defer to OT evaluation RUE Deficits / Details: R UE with decreased wrist and hand grasp, unable to make fist. Pt with 3-/5 bicep strength, 4/5 tricep and shoulder strength RUE Coordination: decreased fine motor LUE Deficits / Details: Pt with decreased grasp, wrist movement, elbow AROM WFL.  LUE Coordination: decreased fine motor;decreased gross motor    Lower Extremity Assessment Lower Extremity Assessment: Generalized weakness;LLE deficits/detail LLE Deficits / Details: 2+/5 hip flexion, knee extension. WFL AROM DF/PF    Cervical / Trunk Assessment Cervical / Trunk Assessment: Normal  Communication   Communication: No difficulties  Cognition Arousal/Alertness: Awake/alert Behavior During Therapy: WFL for tasks assessed/performed Overall Cognitive Status: History of cognitive impairments - at baseline                                 General Comments: A&Ox4, difficulty forllowing commands and lacks awareness of safety especially during mobility.      General Comments General comments (skin integrity, edema, etc.): Initially pt pleasant, then became adamant on refusing OOB activities despite extensive education of benefits of mobility vs consequences of remaining sedentary in bed.     Exercises General Exercises - Lower Extremity Ankle Circles/Pumps: AROM;Both;10 reps;Seated Quad Sets: AROM;Both;10 reps;Seated   Assessment/Plan    PT Assessment Patient needs  continued PT services  PT Problem List Decreased strength;Decreased range of motion;Decreased cognition;Decreased balance;Decreased activity tolerance;Decreased mobility;Decreased coordination;Decreased knowledge of use of DME;Decreased safety awareness;Decreased knowledge of precautions;Cardiopulmonary status limiting activity       PT Treatment Interventions DME instruction;Gait training;Functional mobility training;Therapeutic activities;Therapeutic exercise;Balance training;Neuromuscular re-education;Cognitive remediation;Patient/family education    PT Goals (Current goals can be found in the Care Plan section)  Acute Rehab PT Goals Patient Stated Goal: back to rehab PT Goal Formulation: With patient Time For Goal Achievement: 04/25/20 Potential to Achieve Goals: Good    Frequency Min 2X/week   Barriers to discharge        Co-evaluation               AM-PAC PT "6 Clicks" Mobility  Outcome Measure Help needed turning from your back to your side while in a flat bed without using bedrails?: A Little Help needed moving from lying on your back to sitting on the side of a flat bed without using bedrails?: A Lot Help needed moving to and from a bed to a chair (including a wheelchair)?: A Lot Help needed standing up from a chair using your arms (e.g., wheelchair or bedside chair)?: A Lot Help needed to walk in hospital room?: Total Help needed climbing 3-5 steps with a railing? : Total 6 Click  Score: 11    End of Session Equipment Utilized During Treatment: Gait belt Activity Tolerance: Patient tolerated treatment well;Patient limited by fatigue Patient left: in chair;with call bell/phone within reach;with chair alarm set Nurse Communication: Mobility status PT Visit Diagnosis: Difficulty in walking, not elsewhere classified (R26.2);Other symptoms and signs involving the nervous system (R29.898)    Time: 1443-1540 PT Time Calculation (min) (ACUTE ONLY): 20 min   Charges:    PT Evaluation $PT Eval Low Complexity: 1 Low         Grant Henkes E, PT Acute Rehabilitation Services Pager [phone]  Office [phone]  Shayla Heming D Despina Hidden 04/11/2020, 10:33 AM

## 2020-04-11 NOTE — Progress Notes (Signed)
STROKE TEAM PROGRESS NOTE     INTERVAL HISTORY Patient is lying in bed comfortably.  He states he still has left-sided weakness which has not improved.  Vital signs are stable.  Echocardiogram shows normal ejection fraction without cardiac source of embolism.  Patient is a fall risk and hence not a good long-term anticoagulation candidate hence will not pursue further work-up for embolic source    OBJECTIVE Vitals:   04/10/20 2010 04/10/20 2106 04/11/20 0500 04/11/20 0843  BP: 123/77 119/72  (!) 145/88  Pulse: 85 82  85  Resp: 12 14    Temp: 98.5 F (36.9 C) 98.7 F (37.1 C)  98 F (36.7 C)  TempSrc:  Oral    SpO2: 99% 100%  100%  Weight:   105.1 kg   Height:        CBC:  Recent Labs  Lab 04/06/20 0548 04/11/20 1038  WBC 5.0 4.5  HGB 8.3* 8.9*  HCT 26.0* 28.8*  MCV 83.9 84.0  PLT 298 403*    Basic Metabolic Panel:  Recent Labs  Lab 04/07/20 0416 04/07/20 0826 04/08/20 0631 04/08/20 1710 04/10/20 0256 04/11/20 1038  NA 120*  121*   < > 126*  126*   < > 129* 127*  K 3.6  --  4.0   < > 4.2 4.0  CL 86*  --  91*   < > 95* 92*  CO2 24  --  26   < > 24 23  GLUCOSE 160*  --  114*   < > 116* 105*  BUN 13  --  10   < > 12 12  CREATININE 0.73  --  0.72   < > 0.79 0.75  CALCIUM 8.6*  --  9.0   < > 9.7 9.6  MG 1.5*  --  1.6*  --   --   --   PHOS 3.8  --  4.5   < > 5.3* 5.3*   < > = values in this interval not displayed.    Lipid Panel:     Component Value Date/Time   CHOL 149 04/09/2020 0145   CHOL 222 (H) 02/02/2020 1604   CHOL CANCELED 08/15/2015 1340   TRIG 114 04/09/2020 0145   TRIG CANCELED 08/15/2015 1340   HDL 44 04/09/2020 0145   HDL 109 02/02/2020 1604   CHOLHDL 3.4 04/09/2020 0145   VLDL 23 04/09/2020 0145   VLDL 34 (H) 05/10/2015 1534   LDLCALC 82 04/09/2020 0145   LDLCALC 85 02/02/2020 1604   HgbA1c:  Lab Results  Component Value Date   HGBA1C 7.1 (H) 04/09/2020   Urine Drug Screen:     Component Value Date/Time   LABOPIA NONE  DETECTED 03/30/2020 0958   COCAINSCRNUR NONE DETECTED 03/30/2020 0958   COCAINSCRNUR NONE DETECTED 03/03/2020 0440   LABBENZ POSITIVE (A) 03/30/2020 0958   AMPHETMU NONE DETECTED 03/30/2020 0958   THCU NONE DETECTED 03/30/2020 0958   LABBARB NONE DETECTED 03/30/2020 0958    Alcohol Level     Component Value Date/Time   ETH <10 03/30/2020 0949    IMAGING  CT HEAD WO CONTRAST 04/09/2020 IMPRESSION:  Atrophy and chronic ischemia. No acute abnormality and no change from the recent CT.   MR BRAIN WO CONTRAST MR ANGIO HEAD WO CONTRAST 04/08/2020 IMPRESSION:  Motion degraded. Punctate acute cortical infarct of the left superior frontal gyrus. Moderate chronic microvascular ischemic changes. Small chronic infarcts and microhemorrhages as described. No proximal intracranial vessel occlusion. Potential moderate to  marked stenosis of the paraclinoid internal carotid arteries bilaterally, which could be overestimated due to artifact.   ECHOCARDIOGRAM COMPLETE 04/09/2020 IMPRESSIONS   1. Left ventricular ejection fraction, by estimation, is 70 to 75%. The left ventricle has normal function. The left ventricle has no regional wall motion abnormalities. There is mild left ventricular hypertrophy. Indeterminate diastolic filling due to E-A fusion.   2. Right ventricular systolic function is normal. The right ventricular size is mildly enlarged.   3. The mitral valve is abnormal. Trivial mitral valve regurgitation.   4. The aortic valve is grossly normal. Aortic valve regurgitation is not visualized.   5. The inferior vena cava is normal in size with greater than 50% respiratory variability, suggesting right atrial pressure of 3 mmHg. Comparison(s): No significant change from prior study.   Bilateral Carotid Dopplers  01/28/2020 RIGHT CAROTID ARTERY: Echogenic plaque at the right carotid bulb. External carotid artery is patent with normal waveform. Normal waveforms and velocities in the internal  carotid artery. RIGHT VERTEBRAL ARTERY: Antegrade flow and normal waveform in the right vertebral artery. LEFT CAROTID ARTERY: Small amount of echogenic and heterogeneous plaque at the left carotid bulb. External carotid artery is patent with normal waveform. Normal waveforms and velocities in the internal carotid artery. LEFT VERTEBRAL ARTERY: Antegrade flow and normal waveform in the left vertebral artery. 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 Estimated degree of stenosis in the internal carotid arteries is less than 50% bilaterally.   ECG - 03/30/20 - ST rate 103 BPM. Borderline prolonged QT interval (See cardiology reading for complete details)  EEG 03/07/2020 ABNORMALITY -Continued slow, generalized -Excessive beta, generalized IMPRESSION: This study is suggestive of moderate diffuse encephalopathy, nonspecific etiology. The excessive beta activity seen in the background is most likely due to the effect of benzodiazepine and is a benign EEG pattern. No seizures or epileptiform discharges were seen throughout the recording.   PHYSICAL EXAM Blood pressure (!) 145/88, pulse 85, temperature 98 F (36.7 C), resp. rate 14, height 6' (1.829 m), weight 105.1 kg, SpO2 100 %. Frail middle-age male not in distress. . Afebrile. Head is nontraumatic. Neck is supple without bruit.    Cardiac exam no murmur or gallop. Lungs are clear to auscultation. Distal pulses are well felt. Neurological Exam :  He is awake alert oriented to time and place.  Diminished attention, distraction recall.  Speech is slow but clear no dysarthria.  Follows simple midline and 1 and some two-step commands.  Extraocular movements are full range without nystagmus.  Blinks to threat bilaterally.  Mild left lower facial asymmetry.  Tongue midline.  Motor system exam shows mild right upper extremity drift.  Mild weakness of right grip intrinsic hand muscles and wrist muscles.  Mild proximal bilateral  lower extremity weakness left greater than right.  Tone slightly increased on the left than the right.  Sensation appears preserved bilaterally.  Gait not tested.  Right plantar is downgoing left is equivocal.    ASSESSMENT/PLAN Brad Graham is a 59 y.o. 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  Likely small vessel disease  Resultant right-sided weakness  Code Stroke CT Head - not ordered     CT head - Atrophy and chronic ischemia. No acute abnormality and no change from the recent CT.   MRI head - Motion degraded. Punctate acute cortical infarct of the left superior frontal gyrus. Moderate chronic microvascular ischemic changes. Small chronic infarcts and microhemorrhages as described.   MRA head - No proximal intracranial vessel occlusion. Potential moderate to marked stenosis of the paraclinoid internal carotid arteries bilaterally, which could be overestimated due to artifact  CTA H&N - not ordered  CT Perfusion - not ordered  Carotid Doppler - 01/28/20 - Mild atherosclerotic disease involving the carotid bulbs. Estimated degree of stenosis in the internal carotid arteries is less than 50% bilaterally.  2D Echo - EF - 70 to 75%. No cardiac source of emboli identified.   Sars Corona Virus 2 - negative  LDL - 82  HgbA1c - 7.1  UDS - 03/30/20 - benzodiazepine  VTE  prophylaxis - Lovenox Diet  Diet Order            Diet Carb Modified Fluid consistency: Thin; Room service appropriate? Yes  Diet effective now                 aspirin 81 mg daily prior to admission, now on aspirin 81 mg daily and clopidogrel 75 mg daily into 3 weeks followed by Plavix alone  Patient counseled to be compliant with his antithrombotic medications  Ongoing aggressive stroke risk factor management  Therapy recommendations:  pending  Disposition:  Pending  Hypertension  Home BP meds: Coreg ; Apresoline ; Norvasc  Current BP meds: Coreg  Stable . Permissive hypertension (OK if < 220/120) but gradually normalize in 5-7 days  . Long-term BP goal normotensive  Hyperlipidemia  Home Lipid lowering medication: Lipitor 20 mg daily  LDL 82, goal < 70  Current lipid lowering medication: Lipitor 80 mg daily   Continue statin at discharge  Diabetes  Home diabetic meds: metformin  Current diabetic meds: insulin  HgbA1c 7.1, goal < 7.0 Recent Labs    04/10/20 2026 04/11/20 0958 04/11/20 1207  GLUCAP 151* 105* 154*    Other Stroke Risk Factors  Advanced age  Cigarette smoker - advised to stop smoking  Previous ETOH abuse  Obesity, Body mass index is 31.42 kg/m., recommend weight loss, diet and exercise as appropriate   Family hx stroke (mother)   Hx stroke/TIA  CHF  Other Active Problems  Code status - DNR  Seizure disorder - Dilantin - Phenytoin, Free level 1.9 on 03/08/20 - consider rechecking  Hyponatremia - 120->121->126->127-.130->129 - improving - Nephrology following   Anemia - Hgb - 8.3  Hospital day # 12  Patient developed sudden onset of right-sided weakness due to punctate left frontal infarct etiology unclear as to small vessel disease as he clearly has evidence of that in the MRI but could be embolic but I am not sure patient is a good long-term candidate for anticoagulation given history of heavy alcohol use and alcohol  withdrawal seizures hence will not pursue loop recorder and TEE.  Recommend aspirin and Plavix for 3 weeks followed by Plavix alone.  Aggressive risk factor modification.  Continue ongoing therapies and transfer to skilled nursing facility when bed available.  Stroke team will sign off.  Kindly call for questions. Delia Heady,  MD To contact Stroke Continuity provider, please refer to [email or web address]. After hours, contact General Neurology

## 2020-04-11 NOTE — Progress Notes (Signed)
Occupational Therapy Evaluation Patient Details Name: Brad Graham MRN: [id number] DOB: September 12, 1961 Today's Date: 04/11/2020    History of Present Illness 58 y.o.malewith medical history significantfor a recent hospitalization at [facility](6/9-6/21)for cardiac arrest requiring intubation acute renal failure aspiration pneumonia who was treated in the ICU for several days and subsequently extubated and eventually discharged to [facility]. 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 He has a chronic seizure disorder and is maintained on Dilantin. He has type 2 diabetes mellitus and hypertension.  He was admitted with acute mental status changes and severe hyponatremia with sodium of 109.In am of 04/08/20, patient woke up with some right arm weakness.  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  He was [name] for neurology evaluation.    Clinical Impression   Pt presents with diagnoses above and deficits mentioned below. 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 Pt Setup to Min A for simple grooming tasks due to these deficits. Pt initially pleasant and cooperative then became adamant on avoiding OOB activities today despite varying forms of education. Based on strength/ROM assessment of B UE/LE and collaboration with staff, suspect Mod A for UB ADLs, and Max A to Total A for LB ADLs. Plan to further assess OOB activities as appropriate to maximize independence and decrease caregiver burden.     Follow Up Recommendations  SNF;Supervision/Assistance - 24 hour    Equipment Recommendations  Other (comment) (TBD)    Recommendations for Other Services        Precautions / Restrictions Precautions Precautions: Fall Restrictions Weight Bearing Restrictions: No      Mobility Bed Mobility Overal bed mobility: Needs Assistance                Transfers Overall transfer level: Needs assistance               General transfer comment: Pt refused all OOB activities despite education    Balance                                           ADL either performed or assessed with clinical judgement   ADL Overall ADL's : Needs assistance/impaired Eating/Feeding: Sitting;Minimal assistance   Grooming: Set up;Wash/dry face;Bed level Grooming Details (indicate cue type and reason): Setup to wash face with washcloth, may have more difficulty in fine motor tasks required for other grooming tasks Upper Body Bathing: Moderate assistance;Sitting   Lower Body Bathing: Maximal assistance;Sitting/lateral leans;Bed level   Upper Body Dressing : Sitting;Moderate assistance   Lower Body Dressing: Maximal assistance;Bed level Lower Body Dressing Details (indicate cue type and reason): Max A to don socks bed level, unable to reach feet     Toileting- Clothing Manipulation and Hygiene: Total assistance;Bed level Toileting - Clothing Manipulation Details (indicate cue type and reason): Nursing recently provided Total A care for cleanup after urination in bed       General ADL Comments: Pt with decreased strength of B UE, L LE, decreased endurance, and decreased cognition that impacts ability to complete ADLs without significant assistance.  Vision Baseline Vision/History: No visual deficits Patient Visual Report: No change from baseline Vision Assessment?: No apparent visual deficits     Perception     Praxis      Pertinent Vitals/Pain Pain Assessment: No/denies pain     Hand Dominance Right   Extremity/Trunk Assessment Upper Extremity Assessment Upper Extremity Assessment: RUE deficits/detail;LUE  deficits/detail RUE Deficits / Details: R UE with decreased wrist and hand grasp, unable to make fist. Pt with 3-/5 bicep strength, 4/5 tricep and shoulder strength RUE Coordination: decreased fine motor LUE Deficits / Details: Pt with decreased grasp, wrist movement, elbow AROM WFL.  LUE Coordination: decreased fine motor;decreased gross motor   Lower Extremity Assessment Lower Extremity Assessment: Defer to PT evaluation       Communication Communication Communication: No difficulties   Cognition Arousal/Alertness: Awake/alert Behavior During Therapy: WFL for tasks assessed/performed Overall Cognitive Status: History of cognitive impairments - at baseline                                 General Comments: A&Ox4. Initially pleasant but then became adamant on refusing OOB activities. Decreased awareness of deficits and safety, as well as consequences of immobility   General Comments  Initially pt pleasant, then became adamant on refusing OOB activities despite extensive education of benefits of mobility vs consequences of remaining sedentary in bed.     Exercises     Shoulder Instructions      Home Living Family/patient expects to be discharged to:: Skilled nursing facility                                 Additional Comments: From previous documentation (May, 2021), confirmed by patient/family in room: Pt has his own bedroom on the second floor of the boarding house.  He uses a shared bathroom with tub/shower unit.  Pt reports there are grab bars by the toilet.  Pt reports getting meals from a food pantry next to the group home.      Prior Functioning/Environment Level of Independence: Independent with assistive device(s)        Comments: Pt reports Modified Independent with BADLs. Pt denies using AD for mobility, but per chart, he uses SPC. Pt reports there is a Electrical engineer" at boarding house that assists with meals.         OT Problem List:  Decreased strength;Decreased range of motion;Decreased activity tolerance;Impaired balance (sitting and/or standing);Decreased coordination;Decreased cognition;Decreased safety awareness;Decreased knowledge of use of DME or AE;Impaired UE functional use      OT Treatment/Interventions: Self-care/ADL training;Therapeutic exercise;Neuromuscular education;Energy conservation;DME and/or AE instruction;Therapeutic activities;Patient/family education;Manual therapy    OT Goals(Current goals can be found in the care plan section) Acute Rehab OT Goals Patient Stated Goal: stay in bed OT Goal Formulation: With patient Time For Goal Achievement: 04/25/20 Potential to Achieve Goals: Good ADL Goals Pt Will Perform Eating: Independently;sitting Pt Will Perform Grooming: with modified independence;sitting Pt Will Perform Upper Body Bathing: with min assist;sitting Pt Will Perform Lower Body Bathing: with mod assist;sitting/lateral leans;sit to/from stand Pt/caregiver will Perform Home Exercise Program: Increased strength;Increased ROM;Both right and left upper extremity;With theraband;With theraputty;With minimal assist;With written HEP provided Additional ADL Goal #1: Pt will tolerate sitting EOB >5 minutes with no more than min guard assist required in preparation for ADL tasks.  OT Frequency: Min 2X/week   Barriers to D/C: Inaccessible home  environment;Decreased caregiver support          Co-evaluation              AM-PAC OT "6 Clicks" Daily Activity     Outcome Measure Help from another person eating meals?: A Little Help from another person taking care of personal grooming?: A Little Help from another person toileting, which includes using toliet, bedpan, or urinal?: Total Help from another person bathing (including washing, rinsing, drying)?: A Lot Help from another person to put on and taking off regular upper body clothing?: A Lot Help from another person to put on and taking off  regular lower body clothing?: A Lot 6 Click Score: 13   End of Session Nurse Communication: Mobility status  Activity Tolerance: Other (comment) (limited by motivation, cognition) Patient left: in bed;with call bell/phone within reach;with bed alarm set  OT Visit Diagnosis: Unsteadiness on feet (R26.81);Other abnormalities of gait and mobility (R26.89);Muscle weakness (generalized) (M62.81)                Time: 0272-5366 OT Time Calculation (min): 21 min Charges:  OT General Charges $OT Visit: 1 Visit OT Evaluation $OT Eval Moderate Complexity: 1 Mod  Lorre Munroe, OTR/L  Lorre Munroe 04/11/2020, 8:39 AM

## 2020-04-11 NOTE — Progress Notes (Signed)
[facility]                                                                              Patient Demographics  Brad Graham, is a 59 y.o. male, DOB - [birth year], YQM:[id number]  Admit date - 03/30/2020   Admitting Physician [name], MD  Outpatient Primary MD for the patient is [name], NP  Outpatient specialists:   LOS - 12  days   Medical records reviewed and are as summarized below:    Chief Complaint  Patient presents with   Altered Mental Status       Brief summary   58 y.o.malewith medical history significantfor a recent hospitalization at [facility](6/9-6/21)for cardiac arrest requiring intubation acute renal failure aspiration pneumonia who was treated in the ICU for several days and subsequently extubated and eventually discharged to [facility]. 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 He has a chronic seizure disorder and is maintained on Dilantin. He has type 2 diabetes mellitus and hypertension.  He was admitted with acute mental status changes and severe hyponatremia with sodium of 109.His sodium levels were being monitored at the SNF reportedly and they had recommended giving him IV fluids however the patient refused and he was given oral sodium tablets. He had been taking those reportedly during the last week. As his Na slowly improved, so did his mentation. In am of 04/08/20, patient woke up with some right arm weakness.  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  He was [name] for neurology evaluation.     Assessment & Plan      Hyponatremia -Initially improved with IV Lasix, then trended down again.  Nephrology consulted -Negative balance of 24 L, IV fluids discontinued per renal -Received tolvaptan 7.5 mg on  7/15, 15 mg on 7/16, nephrology to redose 15 mg on 7/17 today -got lasix x 2 PO and Na dropped to 127-- have reached out to nephrology to discuss plans  Acute on chronic diastolic CHF -2D echo showed EF of 70-75% with mild LVH, indeterminate diastolic filling -Responded to IV Lasix, has continued to diurese without Lasix, last dose on 7/11 -Fluid management per renal, negative balance of 24 L  Right hemiparesis/acute ischemic stroke -Patient was noted to have some right-sided weakness 04/09/2019  -MRI brain was obtained which showed acute infarct in the left frontal gyrus. 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 Estimated degree of stenosis in the internal carotid arteries is less than 50% bilaterally. -asa plus plavix as not a candidate for long term anticoagulation so no TEE or loop -on Lipitor 20 mg daily PTA, increased to 80 mg daily   E. coli UTI Had 4 days of IV ceftriaxone, last dose on 04/02/2020  Acute metabolic encephalopathy -Likely due to UTI, hyponatremia, overall improved, near his baseline  History  of seizure disorder Continue Dilantin  History of alcohol use Abstinent for past several weeks, resident at [facility]  Diabetes mellitus type 2 -Hemoglobin A1c 7.1 -Placed on sliding scale insulin, carb modified diet  Essential hypertension -Continue Coreg Amlodipine, hydralazine on hold  Anemia of chronic disease -trend  Obesity Estimated body mass index is 31.42 kg/m as calculated from the following:   Height as of this encounter: 6' (1.829 m).   Weight as of this encounter: 105.1 kg.  Code Status: DNR DVT Prophylaxis:  Lovenox  Family Communication: Discussed all imaging results, lab results, explained to the patient    Disposition Plan:     Status is: Inpatient  Remains  inpatient appropriate because:Inpatient level of care appropriate due to severity of illness   Dispo: The patient is from: Home              Anticipated d/c is to: TBD              Anticipated d/c date is: 2 days              Patient currently is not medically stable to d/c.- need recommendations from nephrology      Time Spent in minutes      Procedures:  None  Consultants:   Nephrology Neurology  Antimicrobials:   Anti-infectives (From admission, onward)   Start     Dose/Rate Route Frequency Ordered Stop   03/30/20 1830  cefTRIAXone (ROCEPHIN) 1 g in sodium chloride 0.9 % 100 mL IVPB        1 g 200 mL/hr over 30 Minutes Intravenous Every 24 hours 03/30/20 1800 04/02/20 1808         Medications  Scheduled Meds:  aspirin EC  81 mg Oral Daily   atorvastatin  80 mg Oral q1800   carvedilol  25 mg Oral BID WC   Chlorhexidine Gluconate Cloth  6 each Topical Daily   cholecalciferol  2,000 Units Oral q morning - 10a   clopidogrel  75 mg Oral Daily   clopidogrel  75 mg Oral Daily   enoxaparin (LOVENOX) injection  55 mg Subcutaneous Q24H   folic acid  1 mg Oral Daily   insulin aspart  0-9 Units Subcutaneous TID WC   magnesium oxide  800 mg Oral BID   pantoprazole  40 mg Oral Daily   phenytoin  200 mg Oral Daily   phenytoin  300 mg Oral QHS   Ensure Max Protein  11 oz Oral BID   senna-docusate  2 tablet Oral QHS   sodium chloride flush  3 mL Intravenous Q12H   tamsulosin  0.4 mg Oral QPC supper   thiamine  100 mg Oral Daily   Continuous Infusions: PRN Meds:.acetaminophen **OR** acetaminophen, bisacodyl, chlorproMAZINE, ondansetron **OR** ondansetron (ZOFRAN) IV      Subjective:   Working with PT.  No complaints  Objective:   Vitals:   04/10/20 2010 04/10/20 2106 04/11/20 0500 04/11/20 0843  BP: 123/77 119/72  (!) 145/88  Pulse: 85 82  85  Resp: 12 14    Temp: 98.5 F (36.9 C) 98.7 F (37.1 C)  98 F (36.7 C)  TempSrc:   Oral    SpO2: 99% 100%  100%  Weight:   105.1 kg   Height:        Intake/Output Summary (Last 24 hours) at 04/11/2020 1439 Last data filed at 04/10/2020 2241 Gross per 24 hour  Intake --  Output 1000 ml  Net -  1000 ml     Wt Readings from Last 3 Encounters:  04/11/20 105.1 kg  03/05/20 119.5 kg  02/16/20 107.7 kg     Exam  General: Appearance:    Obese male in no acute distress     Lungs:     respirations unlabored             Data Reviewed:  I have personally reviewed following labs and imaging studies  Micro Results Recent Results (from the past 240 hour(s))  SARS Coronavirus 2 by RT PCR (hospital order, performed in [facility] hospital lab) Nasopharyngeal Nasopharyngeal Swab     Status: None   Collection Time: 04/08/20  7:30 PM   Specimen: Nasopharyngeal Swab  Result Value Ref Range Status   SARS Coronavirus 2 NEGATIVE NEGATIVE Final    Comment: (NOTE) SARS-CoV-2 target nucleic acids are NOT DETECTED.  The SARS-CoV-2 RNA is generally detectable in upper and lower respiratory specimens during the acute phase of infection. The lowest concentration of SARS-CoV-2 viral copies this assay can detect is 250 copies / mL. A negative result does not preclude SARS-CoV-2 infection and should not be used as the sole basis for treatment or other patient management decisions.  A negative result may occur with improper specimen collection / handling, submission of specimen other than nasopharyngeal swab, presence of viral mutation(s) within the areas targeted by this assay, and inadequate number of viral copies (<250 copies / mL). A negative result must be combined with clinical observations, patient history, and epidemiological information.  Fact Sheet for Patients:   BoilerBrush.com.cy  Fact Sheet for Healthcare Providers: https://pope.com/  This test is not yet approved or  cleared by the [place] FDA and has been  authorized for detection and/or diagnosis of SARS-CoV-2 by FDA under an Emergency Use Authorization (EUA).  This EUA will remain in effect (meaning this test can be used) for the duration of the COVID-19 declaration under Section 564(b)(1) of the Act, 21 U.S.C. section 360bbb-3(b)(1), unless the authorization is terminated or revoked sooner.  Performed at [facility], [address], [place], [place] [address]     Radiology Reports CT HEAD WO CONTRAST  Result Date: 04/09/2020 CLINICAL DATA:  Neuro deficit subacute.  Unresponsive. EXAM: CT HEAD WITHOUT CONTRAST TECHNIQUE: Contiguous axial images were obtained from the base of the skull through the vertex without intravenous contrast. COMPARISON:  CT head 03/30/2020 FINDINGS: Brain: Generalized atrophy. Chronic microvascular ischemic changes in the white matter stable. Chronic infarct in the right external capsule unchanged. Negative for acute infarct, hemorrhage, mass Vascular: Negative for hyperdense vessel Skull: Negative Sinuses/Orbits: Paranasal sinuses clear.  Negative orbit. Other: None IMPRESSION: Atrophy and chronic ischemia. No acute abnormality and no change from the recent CT. Electronically Signed   By: Marlan Palau M.D.   On: 04/09/2020 13:01   CT Head Wo Contrast  Result Date: 03/30/2020 CLINICAL DATA:  Encephalopathy. Additional provided: Elevated sodium levels, lethargic. EXAM: CT HEAD WITHOUT CONTRAST TECHNIQUE: Contiguous axial images were obtained from the base of the skull through the vertex without intravenous contrast. COMPARISON:  Head CT examination 03/03/2020 and earlier FINDINGS: Brain: The examination is mild to moderately motion degraded. Stable, mild generalized parenchymal atrophy. 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 Redemonstrated chronic infarct within the right external capsule. Unchanged moderate patchy hypodensity within the cerebral white matter which is  nonspecific, but consistent with chronic small vessel ischemic disease. There is no acute intracranial hemorrhage. No demarcated cortical  infarct is identified. No extra-axial fluid collection. No evidence of intracranial mass. No midline shift. Incidentally noted cavum septum pellucidum and cavum vergae. Vascular: No hyperdense vessel.  Atherosclerotic calcifications. Skull: Normal. Negative for fracture or focal lesion. Sinuses/Orbits: Visualized orbits show no acute finding. Mild ethmoid sinus mucosal thickening. Moderate right maxillary sinus mucosal thickening. No significant mastoid effusion. IMPRESSION: Mild-to-moderate motion degradation. No evidence of acute intracranial abnormality. Stable generalized parenchymal atrophy and chronic small vessel ischemic disease. Paranasal sinus mucosal thickening as described, most notably right maxillary. Electronically Signed   By: Jackey Loge DO   On: 03/30/2020 10:34   MR ANGIO HEAD WO CONTRAST  Result Date: 04/08/2020 CLINICAL DATA:  Right arm weakness EXAM: MRI HEAD WITHOUT CONTRAST MRA HEAD WITHOUT CONTRAST TECHNIQUE: Multiplanar, multiecho pulse sequences of the brain and surrounding structures were obtained without intravenous contrast. Angiographic images of the head were obtained using MRA technique without contrast. COMPARISON:  2015 FINDINGS: MRI HEAD Motion artifact is present. Brain: A punctate focus of cortical reduced diffusion is present along the left superior frontal gyrus. Prominence of the ventricles and sulci reflects generalized parenchymal volume loss, which appears progressed since 2015. Patchy and confluent areas of T2 hyperintensity in the supratentorial and pontine white matter are nonspecific but probably reflect moderate chronic microvascular ischemic changes. 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 Additional chronic small vessel infarct of the left centrum semiovale. A few  foci of susceptibility along the left basal ganglia likely reflect chronic microhemorrhages. There is no intracranial mass, mass effect, or edema. There is no hydrocephalus or extra-axial fluid collection. Vascular: Major vessel flow voids at the skull base are preserved. Skull and upper cervical spine: Normal marrow signal is preserved. Sinuses/Orbits: Paranasal sinuses are aerated. Orbits are unremarkable. Other: Sella is unremarkable.  Mastoid air cells are clear. MRA HEAD Motion artifact is present. Intracranial internal carotid arteries are patent with atherosclerotic irregularity and potentially moderate to marked stenosis in the paraclinoid regions. Proximal middle and anterior cerebral arteries are patent. Intracranial vertebral arteries, basilar artery, proximal posterior cerebral arteries are patent. IMPRESSION: Motion degraded. Punctate acute cortical infarct of the left superior frontal gyrus. Moderate chronic microvascular ischemic changes. Small chronic infarcts and microhemorrhages as described. No proximal intracranial vessel occlusion. Potential moderate to marked stenosis of the paraclinoid internal carotid arteries bilaterally, which could be overestimated due to artifact. Electronically Signed   By: Guadlupe Spanish M.D.   On: 04/08/2020 16:03   MR BRAIN WO CONTRAST  Result Date: 04/08/2020 CLINICAL DATA:  Right arm weakness EXAM: MRI HEAD WITHOUT CONTRAST MRA HEAD WITHOUT CONTRAST TECHNIQUE: Multiplanar, multiecho pulse sequences of the brain and surrounding structures were obtained without intravenous contrast. Angiographic images of the head were obtained using MRA technique without contrast. COMPARISON:  2015 FINDINGS: MRI HEAD Motion artifact is present. Brain: A punctate focus of cortical reduced diffusion is present along the left superior frontal gyrus. Prominence of the ventricles and sulci reflects generalized parenchymal volume loss, which appears progressed since 2015. Patchy and  confluent areas of T2 hyperintensity in the supratentorial and pontine white matter are nonspecific but probably reflect moderate chronic microvascular ischemic changes. 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 Additional chronic small vessel infarct of the left centrum semiovale. A few foci of susceptibility along the left basal ganglia likely reflect chronic microhemorrhages. There is no intracranial mass, mass effect, or edema. There is no hydrocephalus or  extra-axial fluid collection. Vascular: Major vessel flow voids at the skull base are preserved. Skull and upper cervical spine: Normal marrow signal is preserved. Sinuses/Orbits: Paranasal sinuses are aerated. Orbits are unremarkable. Other: Sella is unremarkable.  Mastoid air cells are clear. MRA HEAD Motion artifact is present. Intracranial internal carotid arteries are patent with atherosclerotic irregularity and potentially moderate to marked stenosis in the paraclinoid regions. Proximal middle and anterior cerebral arteries are patent. Intracranial vertebral arteries, basilar artery, proximal posterior cerebral arteries are patent. IMPRESSION: Motion degraded. Punctate acute cortical infarct of the left superior frontal gyrus. Moderate chronic microvascular ischemic changes. Small chronic infarcts and microhemorrhages as described. No proximal intracranial vessel occlusion. Potential moderate to marked stenosis of the paraclinoid internal carotid arteries bilaterally, which could be overestimated due to artifact. Electronically Signed   By: Guadlupe Spanish M.D.   On: 04/08/2020 16:03   DG CHEST PORT 1 VIEW  Result Date: 04/01/2020 CLINICAL DATA:  Fever. EXAM: PORTABLE CHEST 1 VIEW COMPARISON:  03/30/2020. FINDINGS: Mediastinum and hilar structures normal. Heart size stable. Low lung volumes. Mild left base atelectasis/infiltrate. No pleural effusion or pneumothorax. No acute bony abnormality.  IMPRESSION: Low lung volumes with mild left base atelectasis/infiltrate. Similar findings noted on prior exam. Electronically Signed   By: Maisie Fus  Register   On: 04/01/2020 05:27   DG Chest Portable 1 View  Result Date: 03/30/2020 CLINICAL DATA:  Altered mental status. EXAM: PORTABLE CHEST 1 VIEW COMPARISON:  Prior chest radiograph 03/02/2020 FINDINGS: Mild cardiomegaly, unchanged. Minimal left basilar atelectasis. No appreciable airspace consolidation or frank pulmonary edema. No evidence of pleural effusion or pneumothorax. No acute bony abnormality identified. IMPRESSION: Minimal left basilar atelectasis.  Lungs otherwise clear. Mild cardiomegaly, unchanged. Electronically Signed   By: Jackey Loge DO   On: 03/30/2020 10:36   ECHOCARDIOGRAM COMPLETE  Result Date: 04/09/2020    ECHOCARDIOGRAM REPORT   Patient Name:   Marcha Dutton Date of Exam: 04/09/2020 Medical Rec #:  [id number]        Height:       72.0 in Accession #:    [id number]       Weight:       232.4 lb Date of Birth:  07-18-1961         BSA:          2.271 m Patient Age:    59 years         BP:           126/84 mmHg Patient Gender: M                HR:           94 bpm. [facility] Procedure: 2D Echo Indications:    stroke 434.91  History:        Patient has prior history of Echocardiogram examinations, most                 recent 03/05/2020. Risk Factors:Hypertension, Diabetes and                 Dyslipidemia. CVA. ETOH Abuse.  Sonographer:    Celene Skeen RDCS (AE) Referring Phys: [phone] DAVID TAT  Sonographer Comments: Image acquisition challenging due to respiratory motion. see comments IMPRESSIONS  1. Left ventricular ejection fraction, by estimation, is 70 to 75%. The left ventricle has normal function. The left ventricle has no regional wall motion abnormalities. There is mild left ventricular hypertrophy. Indeterminate diastolic filling due to E-A fusion.  2. Right ventricular systolic function is normal. The right  ventricular size is mildly enlarged.  3. The mitral valve is abnormal. Trivial mitral valve regurgitation.  4. The aortic valve is grossly normal. Aortic valve regurgitation is not visualized.  5. The inferior vena cava is normal in size with greater than 50% respiratory variability, suggesting right atrial pressure of 3 mmHg. Comparison(s): No significant change from prior study. FINDINGS  Left Ventricle: Left ventricular ejection fraction, by estimation, is 70 to 75%. The left ventricle has normal function. The left ventricle has no regional wall motion abnormalities. The left ventricular internal cavity size was normal in size. There is  mild left ventricular hypertrophy. Indeterminate diastolic filling due to E-A fusion. Right Ventricle: The right ventricular size is mildly enlarged. Right vetricular wall thickness was not assessed. Right ventricular systolic function is normal. Left Atrium: Left atrial size was normal in size. Right Atrium: Right atrial size was normal in size. Pericardium: There is no evidence of pericardial effusion. Mitral Valve: The mitral valve is abnormal. There is mild thickening of the mitral valve leaflet(s). Mild mitral annular calcification. Trivial mitral valve regurgitation. Tricuspid Valve: The tricuspid valve is normal in structure. Tricuspid valve regurgitation is mild. Aortic Valve: The aortic valve is grossly normal. Aortic valve regurgitation is not visualized. Pulmonic Valve: The pulmonic valve was not well visualized. Pulmonic valve regurgitation is trivial. Aorta: The aortic root is normal in size and structure. Venous: The inferior vena cava is normal in size with greater than 50% respiratory variability, suggesting right atrial pressure of 3 mmHg. IAS/Shunts: The interatrial septum was not well visualized.  LEFT VENTRICLE PLAX 2D LVIDd:         4.27 cm  Diastology LVIDs:         3.04 cm  LV e' medial:   8.05 cm/s LV PW:         1.02 cm  LV E/e' medial: 7.9 LV IVS:         1.21 cm LVOT diam:     2.20 cm LV SV:         50 LV SV Index:   22 LVOT Area:     3.80 cm  LEFT ATRIUM             Index       RIGHT ATRIUM           Index LA diam:        3.00 cm 1.32 cm/m  RA Area:     16.70 cm LA Vol (A2C):   37.4 ml 16.47 ml/m RA Volume:   38.60 ml  17.00 ml/m LA Vol (A4C):   62.5 ml 27.52 ml/m LA Biplane Vol: 50.5 ml 22.24 ml/m  AORTIC VALVE LVOT Vmax:   77.70 cm/s LVOT Vmean:  55.100 cm/s LVOT VTI:    0.132 m  AORTA Ao Root diam: 3.20 cm MITRAL VALVE MV Area (PHT): 2.73 cm    SHUNTS MV Decel Time: 278 msec    Systemic VTI:  0.13 m MV E velocity: 63.40 cm/s  Systemic Diam: 2.20 cm MV A velocity: 64.30 cm/s MV E/A ratio:  0.99 Dietrich PatesPaula Ross MD Electronically signed by Dietrich PatesPaula Ross MD Signature Date/Time: 04/09/2020/10:16:54 AM    Final     Lab Data:  CBC: Recent Labs  Lab 04/06/20 0548 04/11/20 1038  WBC 5.0 4.5  HGB 8.3* 8.9*  HCT 26.0* 28.8*  MCV 83.9 84.0  PLT 298 403*   Basic Metabolic Panel: Recent Labs  Lab 04/05/20 0453 04/05/20 0453 04/06/20 0548 04/06/20 1123 04/07/20 0416 04/07/20 0826 04/08/20 0631 04/08/20 0631 04/08/20 1710 04/09/20 0145 04/09/20 1203 04/10/20 0256 04/11/20 1038  NA 120*   < > 119*   < > 120*   121*   < > 126*   126*   < > 125* 127* 130* 129* 127*  K 3.7   < > 3.6  --  3.6  --  4.0  --   --  3.8  --  4.2 4.0  CL 83*   < > 83*  --  86*  --  91*  --   --  92*  --  95* 92*  CO2 26   < > 25  --  24  --  26  --   --  25  --  24 23  GLUCOSE 110*   < > 104*  --  160*  --  114*  --   --  152*  --  116* 105*  BUN 9   < > 12  --  13  --  10  --   --  15  --  12 12  CREATININE 0.61   < > 0.68   0.68  --  0.73  --  0.72  --   --  0.72  --  0.79 0.75  CALCIUM 8.6*   < > 8.7*  --  8.6*  --  9.0  --   --  9.2  --  9.7 9.6  MG 1.7  --  1.5*  --  1.5*  --  1.6*  --   --   --   --   --   --   PHOS 4.3   < > 4.5  --  3.8  --  4.5  --   --  4.9*  --  5.3* 5.3*   < > = values in this interval not displayed.   GFR: Estimated Creatinine  Clearance: 121.5 mL/min (by C-G formula based on SCr of 0.75 mg/dL). Liver Function Tests: Recent Labs  Lab 04/07/20 0416 04/08/20 0631 04/09/20 0145 04/10/20 0256 04/11/20 1038  ALBUMIN 2.6* 2.6* 2.8* 2.4* 2.5*   No results for input(s): LIPASE, AMYLASE in the last 168 hours. No results for input(s): AMMONIA in the last 168 hours. Coagulation Profile: No results for input(s): INR, PROTIME in the last 168 hours. Cardiac Enzymes: No results for input(s): CKTOTAL, CKMB, CKMBINDEX, TROPONINI in the last 168 hours. BNP (last 3 results) No results for input(s): PROBNP in the last 8760 hours. HbA1C: Recent Labs    04/09/20 0145  HGBA1C 7.1*   CBG: Recent Labs  Lab 04/10/20 1142 04/10/20 1750 04/10/20 2026 04/11/20 0958 04/11/20 1207  GLUCAP 146* 83 151* 105* 154*   Lipid Profile: Recent Labs    04/09/20 0145  CHOL 149  HDL 44  LDLCALC 82  TRIG 114  CHOLHDL 3.4   Thyroid Function Tests: No results for input(s): TSH, T4TOTAL, FREET4, T3FREE, THYROIDAB in the last 72 hours. Anemia Panel: No results for input(s): VITAMINB12, FOLATE, FERRITIN, TIBC, IRON, RETICCTPCT in the last 72 hours. Urine analysis:    Component Value Date/Time   COLORURINE YELLOW 04/04/2020 1650   APPEARANCEUR CLOUDY (A) 04/04/2020 1650   APPEARANCEUR Clear 05/02/2014 0134   LABSPEC 1.009 04/04/2020 1650   LABSPEC 1.004 05/02/2014 0134   PHURINE 8.0 04/04/2020 1650   GLUCOSEU NEGATIVE 04/04/2020 1650   GLUCOSEU Negative 05/02/2014 0134   HGBUR NEGATIVE 04/04/2020 1650  BILIRUBINUR NEGATIVE 04/04/2020 1650   BILIRUBINUR Negative 05/02/2014 0134   KETONESUR NEGATIVE 04/04/2020 1650   PROTEINUR NEGATIVE 04/04/2020 1650   NITRITE NEGATIVE 04/04/2020 1650   LEUKOCYTESUR NEGATIVE 04/04/2020 1650   LEUKOCYTESUR Negative 05/02/2014 0134     Joseph Art DO [facility] 04/11/2020, 2:39 PM   Call night coverage person covering after 7pm

## 2020-04-11 NOTE — Progress Notes (Signed)
Patient ID: Brad Graham, male   DOB: Aug 27, 1961, 59 y.o.   MRN: [id number] [facility] Progress Note   Assessment/ Plan:   1. 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  Previously treated with 3% saline and then tolvaptan with improvement in Na. Started on lasix which dropped his Na to 127. Agree with holding for now especially as he is overall euvolemic on exam. Fluid restrict to ~1L. If sodium is lower by AM then trial ure-na packets 15g BID (can place order through non-formulary order, discussed with inpatient pharmacy). 2.  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 Pt/ot  3.  Acute exacerbation of diastolic heart failure: euvolemic on exam. Agree with holding lasix for now. Monitor daily weights. 4.  Anemia: Likely secondary to acute illness/hospitalization, no overt loss.  Status post intravenous iron yesterday. 5.  Urinary tract infection: Status post completion of antibiotic therapy.  Anthony Sar, MD [facility]  Subjective:   No complaints, denies n/v/headaches, changes vision, parasthesias.   Objective:   BP (!) 145/88   Pulse 85   Temp 98 F (36.7 C)   Resp 14   Ht 6' (1.829 m)   Wt 105.1 kg   SpO2 100%   BMI 31.42 kg/m   Intake/Output Summary (Last 24 hours) at 04/11/2020 1648 Last data filed at 04/10/2020 2241 Gross per 24 hour  Intake --  Output 1000 ml  Net -1000 ml   Weight change:   Physical Exam: Gen: NAD, comfortable CVS: Pulse regular, normal rate, S1 and S2 normal Resp: cta bl, no w/r/r/c, unlabored, bl chest expansion Abd: Soft, obese, nontender, bowel sounds normal.  Ext: No edema of the lower extremities. Neuro: awake, alert, moves all ext spontaneously  Imaging: No results found.  Labs: BMET Recent Labs  Lab 04/05/20 0453 04/05/20 0453 04/06/20 0548 04/06/20 1123 04/07/20 0416 04/07/20 0826 04/08/20 0137 04/08/20 0631  04/08/20 1710 04/09/20 0145 04/09/20 1203 04/10/20 0256 04/11/20 1038  NA 120*   < > 119*   < > 120*  121*   < > 124* 126*  126* 125* 127* 130* 129* 127*  K 3.7  --  3.6  --  3.6  --   --  4.0  --  3.8  --  4.2 4.0  CL 83*  --  83*  --  86*  --   --  91*  --  92*  --  95* 92*  CO2 26  --  25  --  24  --   --  26  --  25  --  24 23  GLUCOSE 110*  --  104*  --  160*  --   --  114*  --  152*  --  116* 105*  BUN 9  --  12  --  13  --   --  10  --  15  --  12 12  CREATININE 0.61  --  0.68  0.68  --  0.73  --   --  0.72  --  0.72  --  0.79 0.75  CALCIUM 8.6*  --  8.7*  --  8.6*  --   --  9.0  --  9.2  --  9.7 9.6  PHOS 4.3  --  4.5  --  3.8  --   --  4.5  --  4.9*  --  5.3* 5.3*   < > = values in this interval not displayed.  CBC Recent Labs  Lab 04/06/20 0548 04/11/20 1038  WBC 5.0 4.5  HGB 8.3* 8.9*  HCT 26.0* 28.8*  MCV 83.9 84.0  PLT 298 403*    Medications:    . aspirin EC  81 mg Oral Daily  . atorvastatin  80 mg Oral q1800  . carvedilol  25 mg Oral BID WC  . Chlorhexidine Gluconate Cloth  6 each Topical Daily  . cholecalciferol  2,000 Units Oral q morning - 10a  . clopidogrel  75 mg Oral Daily  . clopidogrel  75 mg Oral Daily  . enoxaparin (LOVENOX) injection  55 mg Subcutaneous Q24H  . folic acid  1 mg Oral Daily  . insulin aspart  0-9 Units Subcutaneous TID WC  . magnesium oxide  800 mg Oral BID  . pantoprazole  40 mg Oral Daily  . phenytoin  200 mg Oral Daily  . phenytoin  300 mg Oral QHS  . Ensure Max Protein  11 oz Oral BID  . senna-docusate  2 tablet Oral QHS  . sodium chloride flush  3 mL Intravenous Q12H  . tamsulosin  0.4 mg Oral QPC supper  . thiamine  100 mg Oral Daily

## 2020-04-11 NOTE — Progress Notes (Signed)
Await AM labs.  If BMP stable, possible d/c back to facility. Marlin Canary

## 2020-04-12 LAB — BASIC METABOLIC PANEL
Anion gap: 9 (ref 5–15)
BUN: 21 mg/dL (ref 8–23)
CO2: 22 mmol/L (ref 22–32)
Calcium: 9.2 mg/dL (ref 8.9–10.3)
Chloride: 92 mmol/L — ABNORMAL LOW (ref 98–111)
Creatinine, Ser: 0.97 mg/dL (ref 0.61–1.24)
GFR calc Af Amer: >60 mL/min (ref >60)
GFR calc non Af Amer: >60 mL/min (ref >60)
Glucose, Bld: 140 mg/dL — ABNORMAL HIGH (ref 70–99)
Potassium: 3.9 mmol/L (ref 3.5–5.1)
Sodium: 123 mmol/L — ABNORMAL LOW (ref 135–145)

## 2020-04-12 LAB — RENAL FUNCTION PANEL
Albumin: 2.4 g/dL — ABNORMAL LOW (ref 3.5–5.0)
Anion gap: 9 (ref 5–15)
BUN: 11 mg/dL (ref 8–23)
CO2: 25 mmol/L (ref 22–32)
Calcium: 9.2 mg/dL (ref 8.9–10.3)
Chloride: 91 mmol/L — ABNORMAL LOW (ref 98–111)
Creatinine, Ser: 0.76 mg/dL (ref 0.61–1.24)
GFR calc Af Amer: >60 mL/min (ref >60)
GFR calc non Af Amer: >60 mL/min (ref >60)
Glucose, Bld: 96 mg/dL (ref 70–99)
Phosphorus: 5 mg/dL — ABNORMAL HIGH (ref 2.5–4.6)
Potassium: 4 mmol/L (ref 3.5–5.1)
Sodium: 125 mmol/L — ABNORMAL LOW (ref 135–145)

## 2020-04-12 LAB — GLUCOSE, CAPILLARY
Glucose-Capillary: 93 mg/dL (ref 70–99)
Glucose-Capillary: 107 mg/dL — ABNORMAL HIGH (ref 70–99)
Glucose-Capillary: 119 mg/dL — ABNORMAL HIGH (ref 70–99)

## 2020-04-12 MED ORDER — UREA 15 G PO PACK
15.0000 g | PACK | Freq: Two times a day (BID) | ORAL | Status: DC
  Administered 2020-04-12 – 2020-04-13 (×2): 15 g via ORAL
  Filled 2020-04-12 (×6): qty 1

## 2020-04-12 MED ORDER — NON FORMULARY: 15.0000 g | Freq: Two times a day (BID) | Status: DC

## 2020-04-12 NOTE — Progress Notes (Signed)
Physical Therapy Treatment Patient Details Name: Brad Graham MRN: [id number] DOB: 1961/05/14 Today's Date: 04/12/2020    History of Present Illness 59 y.o. male with medical history significant for a recent hospitalization at [facility] (6/9-6/21) for cardiac arrest requiring intubation acute renal failure aspiration pneumonia who was treated in the ICU for several days and subsequently extubated and eventually discharged to [facility]. Admitted to [facility] on 7/7 for AMS secondary to hyponatremia, transferred to [facility] on 7/16 for acute L frontal gyrus infarct. PMH includes CVA with L hemiparesis, ETOH abuse, HTN, HLD, epilepsy, DM.    PT Comments    Pt required mod encouragement to participate in mobility. Pt tolerated stand pivot and short distance ambulation this day, requiring mod +2 assist for mobility. Pt with difficulty following cues for safety and form, requires max multimodal cuing this day. PT to continue to progress mobility as able.    Follow Up Recommendations  SNF     Equipment Recommendations  Other (comment) (defer)    Recommendations for Other Services       Precautions / Restrictions Precautions Precautions: Fall Restrictions Weight Bearing Restrictions: No    Mobility  Bed Mobility Overal bed mobility: Needs Assistance Bed Mobility: Supine to Sit     Supine to sit: Min assist     General bed mobility comments: min assist for trunk elevation posteriorly, verbal cuing for sequencing  Transfers Overall transfer level: Needs assistance Equipment used: Rolling walker (2 wheeled) Transfers: Sit to/from [employer] Sit to Stand: Mod assist;+2 physical assistance Stand pivot transfers: Mod assist;+2 safety/equipment;+2 physical assistance       General transfer comment: Mod assist +2 for power up, hip extension, and steadying upon standing. Verbal cuing for hand placement when rising (not followed). Stand pivot to [facility]  with Mod +2 for steadying, guidingpt and RW, and verbal cuing for correcting posterior lean and bringing RW closer to BOS (not followed).  Ambulation/Gait Ambulation/Gait assistance: Mod assist;+2 physical assistance;+2 safety/equipment Gait Distance (Feet): 5 Feet Assistive device: Rolling walker (2 wheeled) Gait Pattern/deviations: Step-through pattern;Decreased stride length;Shuffle;Trunk flexed Gait velocity: decr   General Gait Details: Mod +2 for steadying, correcting posterior leaning, keeping RW close to pt, and transitioning from ambulation to sitting in chair. Verbal cuing for correcting posterior leaning, upright posture, and placement in RW.   Stairs             Wheelchair Mobility    Modified Rankin (Stroke Patients Only) Modified Rankin (Stroke Patients Only) Pre-Morbid Rankin Score: Moderately severe disability Modified Rankin: Moderately severe disability     Balance Overall balance assessment: Needs assistance Sitting-balance support: No upper extremity supported Sitting balance-Leahy Scale: Fair Sitting balance - Comments: fair sitting balance EOB while pt finishing lunch Postural control: Posterior lean Standing balance support: Bilateral upper extremity supported;During functional activity Standing balance-Leahy Scale: Poor Standing balance comment: reliance on B UE support, posterior lean and external therapist support needed to maintain standing balance                            Cognition Arousal/Alertness: Awake/alert Behavior During Therapy: WFL for tasks assessed/performed Overall Cognitive Status: History of cognitive impairments - at baseline                                 General Comments: A&Ox4, difficulty forllowing commands and lacks awareness of safety  especially during mobility.      Exercises      General Comments General comments (skin integrity, edema, etc.): Required encouragement to participate, but  eventually agreeable      Pertinent Vitals/Pain Pain Assessment: No/denies pain    Home Living                      Prior Function            PT Goals (current goals can now be found in the care plan section) Acute Rehab PT Goals Patient Stated Goal: back to rehab PT Goal Formulation: With patient Time For Goal Achievement: 04/25/20 Potential to Achieve Goals: Good Progress towards PT goals: Progressing toward goals    Frequency    Min 3X/week      PT Plan Current plan remains appropriate    Co-evaluation PT/OT/SLP Co-Evaluation/Treatment: Yes Reason for Co-Treatment: Necessary to address cognition/behavior during functional activity;For patient/therapist safety;To address functional/ADL transfers PT goals addressed during session: Mobility/safety with mobility;Balance;Proper use of DME OT goals addressed during session: ADL's and self-care      AM-PAC PT "6 Clicks" Mobility   Outcome Measure  Help needed turning from your back to your side while in a flat bed without using bedrails?: A Little Help needed moving from lying on your back to sitting on the side of a flat bed without using bedrails?: A Little Help needed moving to and from a bed to a chair (including a wheelchair)?: A Lot Help needed standing up from a chair using your arms (e.g., wheelchair or bedside chair)?: A Lot Help needed to walk in hospital room?: A Lot Help needed climbing 3-5 steps with a railing? : Total 6 Click Score: 13    End of Session Equipment Utilized During Treatment: Gait belt Activity Tolerance: Patient tolerated treatment well;Patient limited by fatigue Patient left: in chair;with call bell/phone within reach;with chair alarm set Nurse Communication: Mobility status PT Visit Diagnosis: Difficulty in walking, not elsewhere classified (R26.2);Other symptoms and signs involving the nervous system (R29.898)     Time: 1191-4782 PT Time Calculation (min) (ACUTE ONLY): 27  min  Charges:  $Therapeutic Activity: 8-22 mins                     Kirstyn Lean E, PT Acute Rehabilitation Services Pager [phone]  Office [phone]  Brad Graham 04/12/2020, 4:38 PM

## 2020-04-12 NOTE — Progress Notes (Signed)
[facility]                                                                              Patient Demographics  Brad Graham, is a 59 y.o. male, DOB - 1960-11-09, XBM:[id number]  Admit date - 03/30/2020   Admitting Physician [name], MD  Outpatient Primary MD for the patient is [name], NP  Outpatient specialists:   LOS - 13  days   Medical records reviewed and are as summarized below:    Chief Complaint  Patient presents with  . Altered Mental Status       Brief summary   58 y.o.malewith medical history significantfor a recent hospitalization at [facility](6/9-6/21)for cardiac arrest requiring intubation acute renal failure aspiration pneumonia who was treated in the ICU for several days and subsequently extubated and eventually discharged to [facility]. 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 He has a chronic seizure disorder and is maintained on Dilantin. He has type 2 diabetes mellitus and hypertension.  He was admitted with acute mental status changes and severe hyponatremia with sodium of 109.His sodium levels were being monitored at the SNF reportedly and they had recommended giving him IV fluids however the patient refused and he was given oral sodium tablets. He had been taking those reportedly during the last week. As his Na slowly improved, so did his mentation. In am of 04/08/20, patient woke up with some right arm weakness.  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  He was [name] for neurology evaluation. Work up complete.  D/c back to SNF when Na stable.      Assessment & Plan      Hyponatremia -Initially improved with IV Lasix, then trended down again.  Nephrology consulted -Negative balance of 24 L, IV fluids  discontinued per renal -Received tolvaptan 7.5 mg on 7/15, 15 mg on 7/16, nephrology to redose 15 mg on 7/17 today -got lasix x 2 PO and Na dropped to 127-- lasix held -Ure-Na recommended by pharmacy  Acute on chronic diastolic CHF -2D echo showed EF of 70-75% with mild LVH, indeterminate diastolic filling -Fluid management per renal, negative balance of 24 L  Right hemiparesis/acute ischemic stroke -Patient was noted to have some right-sided weakness 04/09/2019  -MRI brain was obtained which showed acute infarct in the left frontal gyrus. 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 Estimated degree of stenosis in the internal carotid arteries is less than 50% bilaterally. -asa plus plavix as not a candidate for long term anticoagulation so no TEE or loop -on Lipitor 20 mg daily PTA, increased to 80 mg daily   E. coli UTI - IV ceftriaxone, last dose on 04/02/2020  Acute metabolic encephalopathy -Likely due to UTI, hyponatremia, overall improved, near his baseline  History of seizure disorder Continue Dilantin  History  of alcohol use Abstinent for past several weeks, resident at [facility] SN[facility]  Diabetes mellitus type 2 -Hemoglobin A1c 7.1 -Placed on sliding scale insulin, carb modified diet  Essential hypertension -Continue Coreg Amlodipine, hydralazine on hold  Anemia of chronic disease -trend  Obesity Estimated body mass index is 31.42 kg/m as calculated from the following:   Height as of this encounter: 6' (1.829 m).   Weight as of this encounter: 105.1 kg.  Code Status: DNR DVT Prophylaxis:  Lovenox  Family Communication: Discussed all imaging results, lab results, explained to the patient    Disposition Plan:     Status is: Inpatient  Remains inpatient appropriate because:Inpatient level  of care appropriate due to severity of illness   Dispo: The patient is from: Home              Anticipated d/c is to: TBD              Anticipated d/c date is: 2 days              Patient currently is not medically stable to d/c.- need recommendations from nephrology      Time Spent in minutes      Procedures:  None  Consultants:   Nephrology Neurology  Antimicrobials:   Anti-infectives (From admission, onward)   Start     Dose/Rate Route Frequency Ordered Stop   03/30/20 1830  cefTRIAXone (ROCEPHIN) 1 g in sodium chloride 0.9 % 100 mL IVPB        1 g 200 mL/hr over 30 Minutes Intravenous Every 24 hours 03/30/20 1800 04/02/20 1808         Medications  Scheduled Meds: . aspirin EC  81 mg Oral Daily  . atorvastatin  80 mg Oral q1800  . carvedilol  25 mg Oral BID WC  . Chlorhexidine Gluconate Cloth  6 each Topical Daily  . cholecalciferol  2,000 Units Oral q morning - 10a  . clopidogrel  75 mg Oral Daily  . enoxaparin (LOVENOX) injection  55 mg Subcutaneous Q24H  . folic acid  1 mg Oral Daily  . insulin aspart  0-9 Units Subcutaneous TID WC  . magnesium oxide  800 mg Oral BID  . pantoprazole  40 mg Oral Daily  . phenytoin  200 mg Oral Daily  . phenytoin  300 mg Oral QHS  . Ensure Max Protein  11 oz Oral BID  . senna-docusate  2 tablet Oral QHS  . sodium chloride flush  3 mL Intravenous Q12H  . tamsulosin  0.4 mg Oral QPC supper  . thiamine  100 mg Oral Daily  . Urea  15 g Oral BID   Continuous Infusions: PRN Meds:.acetaminophen **OR** acetaminophen, bisacodyl, chlorproMAZINE, ondansetron **OR** ondansetron (ZOFRAN) IV      Subjective:   Working with PT.  No complaints  Objective:   Vitals:   04/11/20 0843 04/11/20 1652 04/11/20 2239 04/12/20 0835  BP: (!) 145/88 136/90 122/86 (!) 180/90  Pulse: 85 79 85 82  Resp:    15  Temp: 98 F (36.7 C) 97.8 F (36.6 C) 98.7 F (37.1 C) 98.3 F (36.8 C)  TempSrc:   Oral Oral  SpO2: 100% 98% 100%  98%  Weight:      Height:       No intake or output data in the 24 hours ending 04/12/20 1352   Wt Readings from Last 3 Encounters:  04/11/20 105.1 kg  03/05/20 119.5 kg  02/16/20 107.7 kg  Exam  General: Appearance:    Obese male in no acute distress     Lungs:     Clear to auscultation bilaterally, respirations unlabored  Heart:    Normal heart rate. Normal rhythm. No murmurs, rubs, or gallops.      Neurologic:   Awake, alert- some stuttering this AM with his speech.                       Data Reviewed:  I have personally reviewed following labs and imaging studies  Micro Results Recent Results (from the past 240 hour(s))  SARS Coronavirus 2 by RT PCR (hospital order, performed in [facility] hospital lab) Nasopharyngeal Nasopharyngeal Swab     Status: None   Collection Time: 04/08/20  7:30 PM   Specimen: Nasopharyngeal Swab  Result Value Ref Range Status   SARS Coronavirus 2 NEGATIVE NEGATIVE Final    Comment: (NOTE) SARS-CoV-2 target nucleic acids are NOT DETECTED.  The SARS-CoV-2 RNA is generally detectable in upper and lower respiratory specimens during the acute phase of infection. The lowest concentration of SARS-CoV-2 viral copies this assay can detect is 250 copies / mL. A negative result does not preclude SARS-CoV-2 infection and should not be used as the sole basis for treatment or other patient management decisions.  A negative result may occur with improper specimen collection / handling, submission of specimen other than nasopharyngeal swab, presence of viral mutation(s) within the areas targeted by this assay, and inadequate number of viral copies (<250 copies / mL). A negative result must be combined with clinical observations, patient history, and epidemiological information.  Fact Sheet for Patients:   BoilerBrush.com.cy  Fact Sheet for Healthcare Providers: https://pope.com/  This test is  not yet approved or  cleared by the [place] FDA and has been authorized for detection and/or diagnosis of SARS-CoV-2 by FDA under an Emergency Use Authorization (EUA).  This EUA will remain in effect (meaning this test can be used) for the duration of the COVID-19 declaration under Section 564(b)(1) of the Act, 21 U.S.C. section 360bbb-3(b)(1), unless the authorization is terminated or revoked sooner.  Performed at [facility], [address] [place], [place] [address]     Radiology Reports CT HEAD WO CONTRAST  Result Date: 04/09/2020 CLINICAL DATA:  Neuro deficit subacute.  Unresponsive. EXAM: CT HEAD WITHOUT CONTRAST TECHNIQUE: Contiguous axial images were obtained from the base of the skull through the vertex without intravenous contrast. COMPARISON:  CT head 03/30/2020 FINDINGS: Brain: Generalized atrophy. Chronic microvascular ischemic changes in the white matter stable. Chronic infarct in the right external capsule unchanged. Negative for acute infarct, hemorrhage, mass Vascular: Negative for hyperdense vessel Skull: Negative Sinuses/Orbits: Paranasal sinuses clear.  Negative orbit. Other: None IMPRESSION: Atrophy and chronic ischemia. No acute abnormality and no change from the recent CT. Electronically Signed   By: Marlan Palau M.D.   On: 04/09/2020 13:01   CT Head Wo Contrast  Result Date: 03/30/2020 CLINICAL DATA:  Encephalopathy. Additional provided: Elevated sodium levels, lethargic. EXAM: CT HEAD WITHOUT CONTRAST TECHNIQUE: Contiguous axial images were obtained from the base of the skull through the vertex without intravenous contrast. COMPARISON:  Head CT examination 03/03/2020 and earlier FINDINGS: Brain: The examination is mild to moderately motion degraded. Stable, mild generalized parenchymal atrophy. 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 Redemonstrated chronic infarct within the right external capsule. Unchanged  moderate patchy hypodensity within the cerebral white matter which is  nonspecific, but consistent with chronic small vessel ischemic disease. There is no acute intracranial hemorrhage. No demarcated cortical infarct is identified. No extra-axial fluid collection. No evidence of intracranial mass. No midline shift. Incidentally noted cavum septum pellucidum and cavum vergae. Vascular: No hyperdense vessel.  Atherosclerotic calcifications. Skull: Normal. Negative for fracture or focal lesion. Sinuses/Orbits: Visualized orbits show no acute finding. Mild ethmoid sinus mucosal thickening. Moderate right maxillary sinus mucosal thickening. No significant mastoid effusion. IMPRESSION: Mild-to-moderate motion degradation. No evidence of acute intracranial abnormality. Stable generalized parenchymal atrophy and chronic small vessel ischemic disease. Paranasal sinus mucosal thickening as described, most notably right maxillary. Electronically Signed   By: Jackey Loge DO   On: 03/30/2020 10:34   MR ANGIO HEAD WO CONTRAST  Result Date: 04/08/2020 CLINICAL DATA:  Right arm weakness EXAM: MRI HEAD WITHOUT CONTRAST MRA HEAD WITHOUT CONTRAST TECHNIQUE: Multiplanar, multiecho pulse sequences of the brain and surrounding structures were obtained without intravenous contrast. Angiographic images of the head were obtained using MRA technique without contrast. COMPARISON:  2015 FINDINGS: MRI HEAD Motion artifact is present. Brain: A punctate focus of cortical reduced diffusion is present along the left superior frontal gyrus. Prominence of the ventricles and sulci reflects generalized parenchymal volume loss, which appears progressed since 2015. Patchy and confluent areas of T2 hyperintensity in the supratentorial and pontine white matter are nonspecific but probably reflect moderate chronic microvascular ischemic changes. 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  Additional chronic small vessel infarct of the left centrum semiovale. A few foci of susceptibility along the left basal ganglia likely reflect chronic microhemorrhages. There is no intracranial mass, mass effect, or edema. There is no hydrocephalus or extra-axial fluid collection. Vascular: Major vessel flow voids at the skull base are preserved. Skull and upper cervical spine: Normal marrow signal is preserved. Sinuses/Orbits: Paranasal sinuses are aerated. Orbits are unremarkable. Other: Sella is unremarkable.  Mastoid air cells are clear. MRA HEAD Motion artifact is present. Intracranial internal carotid arteries are patent with atherosclerotic irregularity and potentially moderate to marked stenosis in the paraclinoid regions. Proximal middle and anterior cerebral arteries are patent. Intracranial vertebral arteries, basilar artery, proximal posterior cerebral arteries are patent. IMPRESSION: Motion degraded. Punctate acute cortical infarct of the left superior frontal gyrus. Moderate chronic microvascular ischemic changes. Small chronic infarcts and microhemorrhages as described. No proximal intracranial vessel occlusion. Potential moderate to marked stenosis of the paraclinoid internal carotid arteries bilaterally, which could be overestimated due to artifact. Electronically Signed   By: Guadlupe Spanish M.D.   On: 04/08/2020 16:03   MR BRAIN WO CONTRAST  Result Date: 04/08/2020 CLINICAL DATA:  Right arm weakness EXAM: MRI HEAD WITHOUT CONTRAST MRA HEAD WITHOUT CONTRAST TECHNIQUE: Multiplanar, multiecho pulse sequences of the brain and surrounding structures were obtained without intravenous contrast. Angiographic images of the head were obtained using MRA technique without contrast. COMPARISON:  2015 FINDINGS: MRI HEAD Motion artifact is present. Brain: A punctate focus of cortical reduced diffusion is present along the left superior frontal gyrus. Prominence of the ventricles and sulci reflects  generalized parenchymal volume loss, which appears progressed since 2015. Patchy and confluent areas of T2 hyperintensity in the supratentorial and pontine white matter are nonspecific but probably reflect moderate chronic microvascular ischemic changes. 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 Additional chronic small vessel infarct of the left centrum semiovale. A few foci of susceptibility along the left basal ganglia  likely reflect chronic microhemorrhages. There is no intracranial mass, mass effect, or edema. There is no hydrocephalus or extra-axial fluid collection. Vascular: Major vessel flow voids at the skull base are preserved. Skull and upper cervical spine: Normal marrow signal is preserved. Sinuses/Orbits: Paranasal sinuses are aerated. Orbits are unremarkable. Other: Sella is unremarkable.  Mastoid air cells are clear. MRA HEAD Motion artifact is present. Intracranial internal carotid arteries are patent with atherosclerotic irregularity and potentially moderate to marked stenosis in the paraclinoid regions. Proximal middle and anterior cerebral arteries are patent. Intracranial vertebral arteries, basilar artery, proximal posterior cerebral arteries are patent. IMPRESSION: Motion degraded. Punctate acute cortical infarct of the left superior frontal gyrus. Moderate chronic microvascular ischemic changes. Small chronic infarcts and microhemorrhages as described. No proximal intracranial vessel occlusion. Potential moderate to marked stenosis of the paraclinoid internal carotid arteries bilaterally, which could be overestimated due to artifact. Electronically Signed   By: Guadlupe Spanish M.D.   On: 04/08/2020 16:03   DG CHEST PORT 1 VIEW  Result Date: 04/01/2020 CLINICAL DATA:  Fever. EXAM: PORTABLE CHEST 1 VIEW COMPARISON:  03/30/2020. FINDINGS: Mediastinum and hilar structures normal. Heart size stable. Low lung volumes. Mild left base  atelectasis/infiltrate. No pleural effusion or pneumothorax. No acute bony abnormality. IMPRESSION: Low lung volumes with mild left base atelectasis/infiltrate. Similar findings noted on prior exam. Electronically Signed   By: Maisie Fus  Register   On: 04/01/2020 05:27   DG Chest Portable 1 View  Result Date: 03/30/2020 CLINICAL DATA:  Altered mental status. EXAM: PORTABLE CHEST 1 VIEW COMPARISON:  Prior chest radiograph 03/02/2020 FINDINGS: Mild cardiomegaly, unchanged. Minimal left basilar atelectasis. No appreciable airspace consolidation or frank pulmonary edema. No evidence of pleural effusion or pneumothorax. No acute bony abnormality identified. IMPRESSION: Minimal left basilar atelectasis.  Lungs otherwise clear. Mild cardiomegaly, unchanged. Electronically Signed   By: Jackey Loge DO   On: 03/30/2020 10:36   ECHOCARDIOGRAM COMPLETE  Result Date: 04/09/2020    ECHOCARDIOGRAM REPORT   Patient Name:   Brad Graham Date of Exam: 04/09/2020 Medical Rec #:  [id number]        Height:       72.0 in Accession #:    [id number]       Weight:       232.4 lb Date of Birth:  27-Mar-1961         BSA:          2.271 m Patient Age:    59 years         BP:           126/84 mmHg Patient Gender: M                HR:           94 bpm. [facility] Procedure: 2D Echo Indications:    stroke 434.91  History:        Patient has prior history of Echocardiogram examinations, most                 recent 03/05/2020. Risk Factors:Hypertension, Diabetes and                 Dyslipidemia. CVA. ETOH Abuse.  Sonographer:    Celene Skeen RDCS (AE) Referring Phys: [phone] DAVID TAT  Sonographer Comments: Image acquisition challenging due to respiratory motion. see comments IMPRESSIONS  1. Left ventricular ejection fraction, by estimation, is 70 to 75%. The left ventricle has normal function. The left ventricle has  no regional wall motion abnormalities. There is mild left ventricular hypertrophy. Indeterminate diastolic filling  due to E-A fusion.  2. Right ventricular systolic function is normal. The right ventricular size is mildly enlarged.  3. The mitral valve is abnormal. Trivial mitral valve regurgitation.  4. The aortic valve is grossly normal. Aortic valve regurgitation is not visualized.  5. The inferior vena cava is normal in size with greater than 50% respiratory variability, suggesting right atrial pressure of 3 mmHg. Comparison(s): No significant change from prior study. FINDINGS  Left Ventricle: Left ventricular ejection fraction, by estimation, is 70 to 75%. The left ventricle has normal function. The left ventricle has no regional wall motion abnormalities. The left ventricular internal cavity size was normal in size. There is  mild left ventricular hypertrophy. Indeterminate diastolic filling due to E-A fusion. Right Ventricle: The right ventricular size is mildly enlarged. Right vetricular wall thickness was not assessed. Right ventricular systolic function is normal. Left Atrium: Left atrial size was normal in size. Right Atrium: Right atrial size was normal in size. Pericardium: There is no evidence of pericardial effusion. Mitral Valve: The mitral valve is abnormal. There is mild thickening of the mitral valve leaflet(s). Mild mitral annular calcification. Trivial mitral valve regurgitation. Tricuspid Valve: The tricuspid valve is normal in structure. Tricuspid valve regurgitation is mild. Aortic Valve: The aortic valve is grossly normal. Aortic valve regurgitation is not visualized. Pulmonic Valve: The pulmonic valve was not well visualized. Pulmonic valve regurgitation is trivial. Aorta: The aortic root is normal in size and structure. Venous: The inferior vena cava is normal in size with greater than 50% respiratory variability, suggesting right atrial pressure of 3 mmHg. IAS/Shunts: The interatrial septum was not well visualized.  LEFT VENTRICLE PLAX 2D LVIDd:         4.27 cm  Diastology LVIDs:         3.04 cm  LV  e' medial:   8.05 cm/s LV PW:         1.02 cm  LV E/e' medial: 7.9 LV IVS:        1.21 cm LVOT diam:     2.20 cm LV SV:         50 LV SV Index:   22 LVOT Area:     3.80 cm  LEFT ATRIUM             Index       RIGHT ATRIUM           Index LA diam:        3.00 cm 1.32 cm/m  RA Area:     16.70 cm LA Vol (A2C):   37.4 ml 16.47 ml/m RA Volume:   38.60 ml  17.00 ml/m LA Vol (A4C):   62.5 ml 27.52 ml/m LA Biplane Vol: 50.5 ml 22.24 ml/m  AORTIC VALVE LVOT Vmax:   77.70 cm/s LVOT Vmean:  55.100 cm/s LVOT VTI:    0.132 m  AORTA Ao Root diam: 3.20 cm MITRAL VALVE MV Area (PHT): 2.73 cm    SHUNTS MV Decel Time: 278 msec    Systemic VTI:  0.13 m MV E velocity: 63.40 cm/s  Systemic Diam: 2.20 cm MV A velocity: 64.30 cm/s MV E/A ratio:  0.99 Dietrich Pates MD Electronically signed by Dietrich Pates MD Signature Date/Time: 04/09/2020/10:16:54 AM    Final     Lab Data:  CBC: Recent Labs  Lab 04/06/20 0548 04/11/20 1038  WBC 5.0 4.5  HGB 8.3* 8.9*  HCT 26.0* 28.8*  MCV 83.9 84.0  PLT 298 403*   Basic Metabolic Panel: Recent Labs  Lab 04/06/20 0548 04/06/20 1123 04/07/20 0416 04/07/20 0826 04/08/20 0631 04/08/20 1710 04/09/20 0145 04/09/20 1203 04/10/20 0256 04/11/20 1038 04/12/20 0325  NA 119*   < > 120*  121*   < > 126*  126*   < > 127* 130* 129* 127* 125*  K 3.6  --  3.6  --  4.0  --  3.8  --  4.2 4.0 4.0  CL 83*  --  86*  --  91*  --  92*  --  95* 92* 91*  CO2 25  --  24  --  26  --  25  --  24 23 25   GLUCOSE 104*  --  160*  --  114*  --  152*  --  116* 105* 96  BUN 12  --  13  --  10  --  15  --  12 12 11   CREATININE 0.68  0.68  --  0.73  --  0.72  --  0.72  --  0.79 0.75 0.76  CALCIUM 8.7*  --  8.6*  --  9.0  --  9.2  --  9.7 9.6 9.2  MG 1.5*  --  1.5*  --  1.6*  --   --   --   --   --   --   PHOS 4.5  --  3.8  --  4.5  --  4.9*  --  5.3* 5.3* 5.0*   < > = values in this interval not displayed.   GFR: Estimated Creatinine Clearance: 121.5 mL/min (by C-G formula based on SCr of 0.76  mg/dL). Liver Function Tests: Recent Labs  Lab 04/08/20 0631 04/09/20 0145 04/10/20 0256 04/11/20 1038 04/12/20 0325  ALBUMIN 2.6* 2.8* 2.4* 2.5* 2.4*   No results for input(s): LIPASE, AMYLASE in the last 168 hours. No results for input(s): AMMONIA in the last 168 hours. Coagulation Profile: No results for input(s): INR, PROTIME in the last 168 hours. Cardiac Enzymes: No results for input(s): CKTOTAL, CKMB, CKMBINDEX, TROPONINI in the last 168 hours. BNP (last 3 results) No results for input(s): PROBNP in the last 8760 hours. HbA1C: No results for input(s): HGBA1C in the last 72 hours. CBG: Recent Labs  Lab 04/11/20 0958 04/11/20 1207 04/11/20 1655 04/11/20 2112 04/12/20 1204  GLUCAP 105* 154* 87 121* 93   Lipid Profile: No results for input(s): CHOL, HDL, LDLCALC, TRIG, CHOLHDL, LDLDIRECT in the last 72 hours. Thyroid Function Tests: No results for input(s): TSH, T4TOTAL, FREET4, T3FREE, THYROIDAB in the last 72 hours. Anemia Panel: No results for input(s): VITAMINB12, FOLATE, FERRITIN, TIBC, IRON, RETICCTPCT in the last 72 hours. Urine analysis:    Component Value Date/Time   COLORURINE YELLOW 04/04/2020 1650   APPEARANCEUR CLOUDY (A) 04/04/2020 1650   APPEARANCEUR Clear 05/02/2014 0134   LABSPEC 1.009 04/04/2020 1650   LABSPEC 1.004 05/02/2014 0134   PHURINE 8.0 04/04/2020 1650   GLUCOSEU NEGATIVE 04/04/2020 1650   GLUCOSEU Negative 05/02/2014 0134   HGBUR NEGATIVE 04/04/2020 1650   BILIRUBINUR NEGATIVE 04/04/2020 1650   BILIRUBINUR Negative 05/02/2014 0134   KETONESUR NEGATIVE 04/04/2020 1650   PROTEINUR NEGATIVE 04/04/2020 1650   NITRITE NEGATIVE 04/04/2020 1650   LEUKOCYTESUR NEGATIVE 04/04/2020 1650   LEUKOCYTESUR Negative 05/02/2014 0134     Delayza Lungren U Dylana Shaw DO [facility] 04/12/2020, 1:52 PM   Call night coverage person covering after 7pm

## 2020-04-12 NOTE — TOC CAGE-AID Note (Signed)
Transition of Care [facility]) - CAGE-AID Screening   Patient Details  Name: Brad Graham MRN: [id number] Date of Birth: 12-22-1960  Transition of Care [facility]) CM/SW Contact:    Emeterio Reeve, [place] Phone Number: 04/12/2020, 10:51 AM   Clinical Narrative:  CSW met with pt at bedside. CSW introduced self and explained her role at the hospital. Pt denied alcohol use and substance use.   CAGE-AID Screening:    Have You Ever Felt You Ought to Cut Down on Your Drinking or Drug Use?: No Have People Annoyed You By Critizing Your Drinking Or Drug Use?: No Have You Felt Bad Or Guilty About Your Drinking Or Drug Use?: No Have You Ever Had a Drink or Used Drugs First Thing In The Morning to Steady Your Nerves or to Get Rid of a Hangover?: No CAGE-AID Score: 0  Substance Abuse Education Offered: Yes  Substance abuse interventions: Patient Counseling   Emeterio Reeve, Latanya Presser, [place] Social Worker [phone]

## 2020-04-12 NOTE — Progress Notes (Signed)
Occupational Therapy Treatment Patient Details Name: Brad Graham MRN: [id number] DOB: 08-15-1961 Today's Date: 04/12/2020    History of present illness 59 y.o. male with medical history significant for a recent hospitalization at [facility] (6/9-6/21) for cardiac arrest requiring intubation acute renal failure aspiration pneumonia who was treated in the ICU for several days and subsequently extubated and eventually discharged to [facility]. Admitted to [facility] on 7/7 for AMS secondary to hyponatremia, transferred to [facility] on 7/16 for acute L frontal gyrus infarct. PMH includes CVA with L hemiparesis, ETOH abuse, HTN, HLD, epilepsy, DM.   OT comments  After initial encouragement, pt agreeable to participate in therapy session. Pt demonstrated good static sitting balance EOB to eat lunch, demonstrating improved grasp of items on tray and reaching. Pt requires Mod A to Mod A x 2 for transfers due to posterior lean, decreased ability to maneuver RW, and tendency for premature sitting. Pt required Max A for LB dressing and toileting hygiene due to deficits. Pt with decreased awareness of his deficits, posing safety concerns. Plan to further progress activity tolerance for ADLs in standing and instruct in UE HEP to maximize therapeutic benefit.    Follow Up Recommendations  SNF;Supervision/Assistance - 24 hour    Equipment Recommendations  Other (comment) (TBD)    Recommendations for Other Services      Precautions / Restrictions Precautions Precautions: Fall Restrictions Weight Bearing Restrictions: No       Mobility Bed Mobility Overal bed mobility: Needs Assistance Bed Mobility: Supine to Sit     Supine to sit: Min guard     General bed mobility comments: min guard to ensure trunk stability, cues for sequencing  Transfers Overall transfer level: Needs assistance Equipment used: Rolling walker (2 wheeled) Transfers: Sit to/from [employer] Sit  to Stand: Mod assist Stand pivot transfers: Mod assist;+2 safety/equipment;+2 physical assistance       General transfer comment: Mod A for power up from surfaces with cues needed for hand placement with poor carryover. Pt Mod A x 2 for stand pivots with RW, assistance needed to correct posterior lean and advance RW. Pt with tendency to attempt premature sitting    Balance Overall balance assessment: Needs assistance Sitting-balance support: No upper extremity supported Sitting balance-Leahy Scale: Fair Sitting balance - Comments: fair sitting balance EOB during lunch Postural control: Posterior lean Standing balance support: Bilateral upper extremity supported;During functional activity Standing balance-Leahy Scale: Poor Standing balance comment: reliance on B UE support, posterior lean and external therapist support needed to maintain standing balance                           ADL either performed or assessed with clinical judgement   ADL Overall ADL's : Needs assistance/impaired Eating/Feeding: Set up;Sitting Eating/Feeding Details (indicate cue type and reason): Setup sitting EOB with good static sitting balance. Pt noted to grasp items without much difficulty                 Lower Body Dressing: Maximal assistance;Sit to/from stand Lower Body Dressing Details (indicate cue type and reason): Max A to don socks sitting EOB, difficulty reaching Toilet Transfer: Moderate assistance;+2 for safety/equipment;Stand-pivot;BSC;RW Toilet Transfer Details (indicate cue type and reason): Mod A x 2 for stand pivot to [facility] with pt demo heavy posterior lean and difficulty managing RW Toileting- Clothing Manipulation and Hygiene: Maximal assistance;Sit to/from stand Toileting - Clothing Manipulation Details (indicate cue type and reason):  max A for toileting hygiene. max encouragement needed for pt to even attempt to wipe posterior region       General ADL Comments: Pt with  continued decreased strength, poor standing balance with posterior lean, and poor insight into deficits and assistance needed     Vision   Vision Assessment?: No apparent visual deficits   Perception     Praxis      Cognition Arousal/Alertness: Awake/alert Behavior During Therapy: WFL for tasks assessed/performed Overall Cognitive Status: History of cognitive impairments - at baseline                                 General Comments: A&Ox4, difficulty forllowing commands and lacks awareness of safety especially during mobility.        Exercises     Shoulder Instructions       General Comments Required encouragement to participate, but eventually agreeable    Pertinent Vitals/ Pain       Pain Assessment: No/denies pain  Home Living                                          Prior Functioning/Environment              Frequency  Min 2X/week        Progress Toward Goals  OT Goals(current goals can now be found in the care plan section)  Progress towards OT goals: Progressing toward goals  Acute Rehab OT Goals Patient Stated Goal: watch the game OT Goal Formulation: With patient Time For Goal Achievement: 04/25/20 Potential to Achieve Goals: Good ADL Goals Pt Will Perform Eating: Independently;sitting Pt Will Perform Grooming: with modified independence;sitting Pt Will Perform Upper Body Bathing: with min assist;sitting Pt Will Perform Lower Body Bathing: with mod assist;sitting/lateral leans;sit to/from stand Pt Will Perform Lower Body Dressing: with set-up;with min assist;sitting/lateral leans Pt Will Transfer to Toilet: with min assist;with +2 assist Pt/caregiver will Perform Home Exercise Program: Increased strength;Increased ROM;Both right and left upper extremity;With theraband;With theraputty;With minimal assist;With written HEP provided Additional ADL Goal #1: Pt will tolerate sitting EOB >5 minutes with no more than  min guard assist required in preparation for ADL tasks.  Plan Frequency remains appropriate;Discharge plan remains appropriate    Co-evaluation    PT/OT/SLP Co-Evaluation/Treatment: Yes Reason for Co-Treatment: Complexity of the patient's impairments (multi-system involvement);Necessary to address cognition/behavior during functional activity;For patient/therapist safety;To address functional/ADL transfers   OT goals addressed during session: ADL's and self-care      AM-PAC OT "6 Clicks" Daily Activity     Outcome Measure   Help from another person eating meals?: A Little Help from another person taking care of personal grooming?: A Little Help from another person toileting, which includes using toliet, bedpan, or urinal?: A Lot Help from another person bathing (including washing, rinsing, drying)?: A Lot Help from another person to put on and taking off regular upper body clothing?: A Little Help from another person to put on and taking off regular lower body clothing?: A Lot 6 Click Score: 15    End of Session Equipment Utilized During Treatment: Gait belt;Rolling walker  OT Visit Diagnosis: Unsteadiness on feet (R26.81);Other abnormalities of gait and mobility (R26.89);Muscle weakness (generalized) (M62.81)   Activity Tolerance Patient tolerated treatment well   Patient Left in chair;with call bell/phone within  reach;with chair alarm set   Nurse Communication Mobility status        Time: 0623-7628 OT Time Calculation (min): 30 min  Charges: OT General Charges $OT Visit: 1 Visit OT Treatments $Self Care/Home Management : 8-22 mins  Lorre Munroe, OTR/L   Lorre Munroe 04/12/2020, 3:43 PM

## 2020-04-12 NOTE — Progress Notes (Signed)
Patient ID: Brad Graham, male   DOB: July 06, 1961, 59 y.o.   MRN: [id number] [facility] Progress Note   Assessment/ Plan:   1. 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  Previously treated with 3% saline and then tolvaptan with improvement in Na. Started on lasix which dropped his Na to 127 now 125 today. Agree with holding diuretics for now especially as he is overall euvolemic on exam. Fluid restrict to ~1L/day. -na worsened: agree with starting ure-na 15g BID to start with, repeat na this afternoon 2.  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 Pt/ot  3.  Acute exacerbation of diastolic heart failure: hold lasix for now. Monitor daily weights. 4.  Anemia: Likely secondary to acute illness/hospitalization, no overt loss.  Status post intravenous iron yesterday. 5.  Urinary tract infection: Status post completion of antibiotic therapy.  Anthony Sar, MD [facility]  Subjective:   No new complaints.  Denies any changes in vision, nausea, vomiting, headaches, numbness/tingling.  He denies any worsening swelling, shortness of breath.   Objective:   BP (!) 180/90 (BP Location: Right Arm)   Pulse 82   Temp 98.3 F (36.8 C) (Oral)   Resp 15   Ht 6' (1.829 m)   Wt 105.1 kg   SpO2 98%   BMI 31.42 kg/m  No intake or output data in the 24 hours ending 04/12/20 1029 Weight change:   Physical Exam: Gen: NAD, comfortable HEENT: moist mucosal membranes CVS: Pulse regular, normal rate, S1 and S2 normal Resp: cta bl, no w/r/r/c, unlabored, bl chest expansion Abd: Soft, obese, nontender, bowel sounds normal.  Ext: No edema of the lower extremities, compression stockings in place Neuro: awake, alert, moves all ext spontaneously, speech clear and coherent  Imaging: No results found.  Labs: BMET Recent Labs  Lab 04/06/20 0548 04/06/20 1123 04/07/20 0416 04/07/20 0826  04/08/20 0631 04/08/20 1710 04/09/20 0145 04/09/20 1203 04/10/20 0256 04/11/20 1038 04/12/20 0325  NA 119*   < > 120*  121*   < > 126*  126* 125* 127* 130* 129* 127* 125*  K 3.6  --  3.6  --  4.0  --  3.8  --  4.2 4.0 4.0  CL 83*  --  86*  --  91*  --  92*  --  95* 92* 91*  CO2 25  --  24  --  26  --  25  --  24 23 25   GLUCOSE 104*  --  160*  --  114*  --  152*  --  116* 105* 96  BUN 12  --  13  --  10  --  15  --  12 12 11   CREATININE 0.68  0.68  --  0.73  --  0.72  --  0.72  --  0.79 0.75 0.76  CALCIUM 8.7*  --  8.6*  --  9.0  --  9.2  --  9.7 9.6 9.2  PHOS 4.5  --  3.8  --  4.5  --  4.9*  --  5.3* 5.3* 5.0*   < > = values in this interval not displayed.   CBC Recent Labs  Lab 04/06/20 0548 04/11/20 1038  WBC 5.0 4.5  HGB 8.3* 8.9*  HCT 26.0* 28.8*  MCV 83.9 84.0  PLT 298 403*    Medications:    . aspirin EC  81 mg Oral Daily  . atorvastatin  80 mg Oral  q1800  . carvedilol  25 mg Oral BID WC  . Chlorhexidine Gluconate Cloth  6 each Topical Daily  . cholecalciferol  2,000 Units Oral q morning - 10a  . clopidogrel  75 mg Oral Daily  . clopidogrel  75 mg Oral Daily  . enoxaparin (LOVENOX) injection  55 mg Subcutaneous Q24H  . folic acid  1 mg Oral Daily  . insulin aspart  0-9 Units Subcutaneous TID WC  . magnesium oxide  800 mg Oral BID  . NON FORMULARY 15 g  15 g Oral BID  . pantoprazole  40 mg Oral Daily  . phenytoin  200 mg Oral Daily  . phenytoin  300 mg Oral QHS  . Ensure Max Protein  11 oz Oral BID  . senna-docusate  2 tablet Oral QHS  . sodium chloride flush  3 mL Intravenous Q12H  . tamsulosin  0.4 mg Oral QPC supper  . thiamine  100 mg Oral Daily

## 2020-04-13 LAB — GLUCOSE, CAPILLARY
Glucose-Capillary: 81 mg/dL (ref 70–99)
Glucose-Capillary: 137 mg/dL — ABNORMAL HIGH (ref 70–99)
Glucose-Capillary: 98 mg/dL (ref 70–99)
Glucose-Capillary: 130 mg/dL — ABNORMAL HIGH (ref 70–99)

## 2020-04-13 LAB — RENAL FUNCTION PANEL
Albumin: 2.4 g/dL — ABNORMAL LOW (ref 3.5–5.0)
Anion gap: 9 (ref 5–15)
BUN: 15 mg/dL (ref 8–23)
CO2: 24 mmol/L (ref 22–32)
Calcium: 9.1 mg/dL (ref 8.9–10.3)
Chloride: 90 mmol/L — ABNORMAL LOW (ref 98–111)
Creatinine, Ser: 0.78 mg/dL (ref 0.61–1.24)
GFR calc Af Amer: >60 mL/min (ref >60)
GFR calc non Af Amer: >60 mL/min (ref >60)
Glucose, Bld: 89 mg/dL (ref 70–99)
Phosphorus: 4.8 mg/dL — ABNORMAL HIGH (ref 2.5–4.6)
Potassium: 3.8 mmol/L (ref 3.5–5.1)
Sodium: 123 mmol/L — ABNORMAL LOW (ref 135–145)

## 2020-04-13 LAB — CREATININE, SERUM
Creatinine, Ser: 0.81 mg/dL (ref 0.61–1.24)
GFR calc Af Amer: >60 mL/min (ref >60)
GFR calc non Af Amer: >60 mL/min (ref >60)

## 2020-04-13 LAB — PHENYTOIN LEVEL, TOTAL: Phenytoin Lvl: 13.9 ug/mL (ref 10.0–20.0)

## 2020-04-13 MED ORDER — UREA 15 G PO PACK
30.0000 g | PACK | Freq: Two times a day (BID) | ORAL | Status: DC
  Administered 2020-04-13 – 2020-04-15 (×4): 30 g via ORAL
  Filled 2020-04-13 (×6): qty 2

## 2020-04-13 NOTE — Progress Notes (Signed)
PROGRESS NOTE    Brad Graham  WUJ:[id number] DOB: 06/25/1961 DOA: 03/30/2020 PCP: Mardene CelesteAsaro, Brad I, NP   Brief Narrative:   59 y.o.malewith medical history significantfor a recent hospitalization at [facility](6[facility]9-6/21)for cardiac arrest requiring intubation acute renal failure aspiration pneumonia who was treated in the ICU for several days and subsequently extubated and eventually discharged to [facility]. 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 He has a chronic seizure disorder and is maintained on Dilantin. He has type 2 diabetes mellitus and hypertension.  He was admitted with acute mental status changes and severe hyponatremia with sodium of 109.His sodium levels were being monitored at the SNF reportedly and they had recommended giving him IV fluids however the patient refused and he was given oral sodium tablets. He had been taking those reportedly during the last week. As his Na slowly improved, so did his mentation. In am of 04/08/20, patient woke up with some right arm weakness. 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 [facility]. Work up complete.  D/c back to SNF when Na stable.    Assessment & Plan:   Principal Problem:   Hyponatremia Active Problems:   Essential hypertension   Diabetes mellitus without complication (HCC)   Hyperlipidemia   Proteinuria   History of Cardiopulmonary arrest    Acute encephalopathy   Hyperglycemia   Volume overload   Alcohol abuse   Centrencephalic epilepsy (HCC)   Hemiparesis affecting left side as late effect of cerebrovascular accident   Bilateral leg edema   Sinus tachycardia   UTI (urinary tract infection)   DNR (do not resuscitate)   Acute on chronic diastolic CHF (congestive heart failure)  (HCC)   Cerebral thrombosis with cerebral infarction   Hyponatremia-ongoing -Initially improved with IV Lasix, then trended down again.  Nephrology consulted -Negative balance of 24 L, IV fluids discontinued per renal -Received tolvaptan 7.5 mg on 7/15, 15 mg on 7/16, nephrology to redose 15 mg on 7/17 today -got lasix x 2 PO and Na dropped to 127-- lasix held -Ure-Na recommended by pharmacy -Urea to 30mg  PO BID today per Nephrology -Fluid restrict to less than 1L per day; could have possible SIADH related to CVA  Acute on chronic diastolic CHF -2D echo showed EF of 70-75% with mild LVH, indeterminate diastolic filling -Fluid management per renal, negative balance of 24 L -Holding lasix for now -Monitor daily weights -Fluid restriction as above  Right hemiparesis/acute ischemic stroke -Patient was noted to have some right-sided weakness 04/09/2019  -MRI brain was obtained which showed acute infarct in the left frontal gyrus. 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 Estimated degree of stenosis in the internal carotid arteries is less than 50% bilaterally. -asa plus plavix as not a candidate for long term anticoagulation so no TEE or loop -on Lipitor 20 mg daily PTA, increased to 80 mg daily   E. coli UTI-resolved - IV ceftriaxone, last dose on 04/02/2020  Acute metabolic encephalopathy -Likely due to UTI, hyponatremia, overall improved, near his baseline  History of seizure disorder Continue Dilantin  History of alcohol use Abstinent for past several weeks, resident [facility]  Diabetes  mellitus type 2 -Hemoglobin A1c 7.1 -Placed on sliding scale insulin, carb modified diet  Essential hypertension -Continue Coreg Amlodipine, hydralazine on hold  Anemia of chronic  disease -trend  Obesity Estimated body mass index is 31.42 kg/m as calculated from the following:   Height as of this encounter: 6' (1.829 m).   Weight as of this encounter: 105.1 kg.  Code Status: DNR DVT Prophylaxis:  Lovenox  Family Communication: Discussed all imaging results, lab results, explained to the patient    Disposition Plan:     Status is: Inpatient  Remains inpatient appropriate because:Inpatient level of care appropriate due to severity of illness   Dispo: The patient is from: Home  Anticipated d/c is to: SNF  Anticipated d/c date is: 1-2 days  Patient currently is not medically stable to d/c.- need ongoing recommendations from nephrology  Consultants:   Nephrology  Neurology-now s/o  Procedures:   See below  Antimicrobials:  Anti-infectives (From admission, onward)   Start     Dose/Rate Route Frequency Ordered Stop   03/30/20 1830  cefTRIAXone (ROCEPHIN) 1 g in sodium chloride 0.9 % 100 mL IVPB        1 g 200 mL/hr over 30 Minutes Intravenous Every 24 hours 03/30/20 1800 04/02/20 1808       Subjective: Patient seen and evaluated today with no new acute complaints or concerns. No acute concerns or events noted overnight.  Objective: Vitals:   04/12/20 0835 04/12/20 1726 04/12/20 2131 04/13/20 0753  BP: (!) 180/90 (!) 141/85 134/82 121/69  Pulse: 82 84 85 89  Resp: 15 16  20   Temp: 98.3 F (36.8 C)  98.3 F (36.8 C) 98.8 F (37.1 C)  TempSrc: Oral  Oral   SpO2: 98% 98% 99% 99%  Weight:      Height:       No intake or output data in the 24 hours ending 04/13/20 1052 Filed Weights   04/07/20 0408 04/09/20 0639 04/11/20 0500  Weight: 106.8 kg 105.4 kg 105.1 kg    Examination:  General exam: Appears calm and comfortable  Respiratory system: Clear to auscultation. Respiratory effort normal. Cardiovascular system: S1 & S2 heard, RRR. No JVD, murmurs, rubs, gallops or clicks. No pedal  edema. Gastrointestinal system: Abdomen is nondistended, soft and nontender. No organomegaly or masses felt. Normal bowel sounds heard. Central nervous system: Alert and awake. Extremities: No edema Skin: No rashes, lesions or ulcers Psychiatry: Difficult to assess     Data Reviewed: I have personally reviewed following labs and imaging studies  CBC: Recent Labs  Lab 04/11/20 1038  WBC 4.5  HGB 8.9*  HCT 28.8*  MCV 84.0  PLT 403*   Basic Metabolic Panel: Recent Labs  Lab 04/07/20 0416 04/07/20 0826 04/08/20 0631 04/08/20 1710 04/09/20 0145 04/09/20 1203 04/10/20 0256 04/11/20 1038 04/12/20 0325 04/12/20 1842 04/13/20 0548  NA 120*  121*   < > 126*  126*   < > 127*   < > 129* 127* 125* 123* 123*  K 3.6  --  4.0  --  3.8  --  4.2 4.0 4.0 3.9 3.8  CL 86*  --  91*  --  92*  --  95* 92* 91* 92* 90*  CO2 24  --  26  --  25  --  24 23 25 22 24   GLUCOSE 160*  --  114*  --  152*  --  116* 105* 96 140* 89  BUN 13  --  10  --  15  --  12 12 11 21 15   CREATININE 0.73  --  0.72  --  0.72  --  0.79 0.75 0.76 0.97 0.81  0.78  CALCIUM 8.6*  --  9.0  --  9.2  --  9.7 9.6 9.2 9.2 9.1  MG 1.5*  --  1.6*  --   --   --   --   --   --   --   --   PHOS 3.8  --  4.5  --  4.9*  --  5.3* 5.3* 5.0*  --  4.8*   < > = values in this interval not displayed.   GFR: Estimated Creatinine Clearance: 120 mL/min (by C-G formula based on SCr of 0.81 mg/dL). Liver Function Tests: Recent Labs  Lab 04/09/20 0145 04/10/20 0256 04/11/20 1038 04/12/20 0325 04/13/20 0548  ALBUMIN 2.8* 2.4* 2.5* 2.4* 2.4*   No results for input(s): LIPASE, AMYLASE in the last 168 hours. No results for input(s): AMMONIA in the last 168 hours. Coagulation Profile: No results for input(s): INR, PROTIME in the last 168 hours. Cardiac Enzymes: No results for input(s): CKTOTAL, CKMB, CKMBINDEX, TROPONINI in the last 168 hours. BNP (last 3 results) No results for input(s): PROBNP in the last 8760 hours. HbA1C: No  results for input(s): HGBA1C in the last 72 hours. CBG: Recent Labs  Lab 04/11/20 2112 04/12/20 1204 04/12/20 1730 04/12/20 2129 04/13/20 0755  GLUCAP 121* 93 107* 119* 81   Lipid Profile: No results for input(s): CHOL, HDL, LDLCALC, TRIG, CHOLHDL, LDLDIRECT in the last 72 hours. Thyroid Function Tests: No results for input(s): TSH, T4TOTAL, FREET4, T3FREE, THYROIDAB in the last 72 hours. Anemia Panel: No results for input(s): VITAMINB12, FOLATE, FERRITIN, TIBC, IRON, RETICCTPCT in the last 72 hours. Sepsis Labs: No results for input(s): PROCALCITON, LATICACIDVEN in the last 168 hours.  Recent Results (from the past 240 hour(s))  SARS Coronavirus 2 by RT PCR (hospital order, performed in [facility] hospital lab) Nasopharyngeal Nasopharyngeal Swab     Status: None   Collection Time: 04/08/20  7:30 PM   Specimen: Nasopharyngeal Swab  Result Value Ref Range Status   SARS Coronavirus 2 NEGATIVE NEGATIVE Final    Comment: (NOTE) SARS-CoV-2 target nucleic acids are NOT DETECTED.  The SARS-CoV-2 RNA is generally detectable in upper and lower respiratory specimens during the acute phase of infection. The lowest concentration of SARS-CoV-2 viral copies this assay can detect is 250 copies / mL. A negative result does not preclude SARS-CoV-2 infection and should not be used as the sole basis for treatment or other patient management decisions.  A negative result may occur with improper specimen collection / handling, submission of specimen other than nasopharyngeal swab, presence of viral mutation(s) within the areas targeted by this assay, and inadequate number of viral copies (<250 copies / mL). A negative result must be combined with clinical observations, patient history, and epidemiological information.  Fact Sheet for Patients:   04/10/20  Fact Sheet for Healthcare Providers: BoilerBrush.com.cy  This test is not yet  approved or  cleared by the https://pope.com/ FDA and has been authorized for detection and/or diagnosis of SARS-CoV-2 by FDA under an Emergency Use Authorization (EUA).  This EUA will remain in effect (meaning this test can be used) for the duration of the COVID-19 declaration under Section 564(b)(1) of the Act, 21 U.S.C. section 360bbb-3(b)(1), unless the authorization is terminated or revoked sooner.  Performed at [facility], [address], [place], [place]  [address]          Radiology Studies: No results found.      Scheduled Meds: . aspirin EC  81 mg Oral Daily  . atorvastatin  80 mg Oral q1800  . carvedilol  25 mg Oral BID WC  . Chlorhexidine Gluconate Cloth  6 each Topical Daily  . cholecalciferol  2,000 Units Oral q morning - 10a  . clopidogrel  75 mg Oral Daily  . enoxaparin (LOVENOX) injection  55 mg Subcutaneous Q24H  . folic acid  1 mg Oral Daily  . insulin aspart  0-9 Units Subcutaneous TID WC  . magnesium oxide  800 mg Oral BID  . pantoprazole  40 mg Oral Daily  . phenytoin  200 mg Oral Daily  . phenytoin  300 mg Oral QHS  . Ensure Max Protein  11 oz Oral BID  . senna-docusate  2 tablet Oral QHS  . sodium chloride flush  3 mL Intravenous Q12H  . tamsulosin  0.4 mg Oral QPC supper  . thiamine  100 mg Oral Daily  . Urea  30 g Oral BID    LOS: 14 days    Time spent: 35 minutes    Zekiah Coen Hoover Brunette, DO [facility]  If 7PM-7AM, please contact night-coverage [email or web address] 04/13/2020, 10:52 AM

## 2020-04-13 NOTE — Progress Notes (Signed)
Patient ID: Brad Graham, male   DOB: May 30, 1961, 59 y.o.   MRN: [id number] [facility] Progress Note   Assessment/ Plan:   1. 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  Previously treated with 3% saline and then tolvaptan with improvement in Na. Started on lasix which dropped his Na to 127 now 123 today. Cont to hold diuretics for now especially as he is overall euvolemic on exam. Fluid restrict to ~1L/day. -na 123. Will increase ure-na to 30g bid -just stopped lasix, will hold off on repeat urine lytes for today (evaluating for another etiology at this point, I.e. is this something stemming from his acute infarct leading to a siadh picture?) 2.  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 Pt/ot  3.  Acute exacerbation of diastolic heart failure: hold lasix for now. Monitor daily weights. 4.  Anemia: Likely secondary to acute illness/hospitalization, no overt loss.  Status post intravenous iron 5.  Urinary tract infection: Status post completion of antibiotic therapy.  Anthony Sar, MD [facility]  Subjective:   No new complaints or acute events. Sodium dropped to 123 yesterday and this AM.  Still denies vision changes, headaches, n/v, loss of appetite, swelling, orthopnea, SOB, cp   Objective:   BP 121/69 (BP Location: Right Arm)   Pulse 89   Temp 98.8 F (37.1 C)   Resp 20   Ht 6' (1.829 m)   Wt 105.1 kg   SpO2 99%   BMI 31.42 kg/m  No intake or output data in the 24 hours ending 04/13/20 1046 Weight change:   Physical Exam: Gen: NAD, comfortable, laying in bed flat HEENT: moist mucosal membranes CVS: Pulse regular, normal rate, S1 and S2 normal Resp: cta bl, no w/r/r/c, unlabored, bl chest expansion Abd: Soft, obese, nontender, bowel sounds normal.  Ext: No edema of the lower extremities, compression stockings in place Neuro: awake, alert, moves all ext  spontaneously, speech clear and coherent, AAOX3  Imaging: No results found.  Labs: BMET Recent Labs  Lab 04/07/20 0416 04/07/20 0826 04/08/20 0631 04/08/20 1710 04/09/20 0145 04/09/20 1203 04/10/20 0256 04/11/20 1038 04/12/20 0325 04/12/20 1842 04/13/20 0548  NA 120*  121*   < > 126*  126*   < > 127* 130* 129* 127* 125* 123* 123*  K 3.6  --  4.0  --  3.8  --  4.2 4.0 4.0 3.9 3.8  CL 86*  --  91*  --  92*  --  95* 92* 91* 92* 90*  CO2 24  --  26  --  25  --  24 23 25 22 24   GLUCOSE 160*  --  114*  --  152*  --  116* 105* 96 140* 89  BUN 13  --  10  --  15  --  12 12 11 21 15   CREATININE 0.73  --  0.72  --  0.72  --  0.79 0.75 0.76 0.97 0.81  0.78  CALCIUM 8.6*  --  9.0  --  9.2  --  9.7 9.6 9.2 9.2 9.1  PHOS 3.8  --  4.5  --  4.9*  --  5.3* 5.3* 5.0*  --  4.8*   < > = values in this interval not displayed.   CBC Recent Labs  Lab 04/11/20 1038  WBC 4.5  HGB 8.9*  HCT 28.8*  MCV 84.0  PLT 403*    Medications:    . aspirin  EC  81 mg Oral Daily  . atorvastatin  80 mg Oral q1800  . carvedilol  25 mg Oral BID WC  . Chlorhexidine Gluconate Cloth  6 each Topical Daily  . cholecalciferol  2,000 Units Oral q morning - 10a  . clopidogrel  75 mg Oral Daily  . enoxaparin (LOVENOX) injection  55 mg Subcutaneous Q24H  . folic acid  1 mg Oral Daily  . insulin aspart  0-9 Units Subcutaneous TID WC  . magnesium oxide  800 mg Oral BID  . pantoprazole  40 mg Oral Daily  . phenytoin  200 mg Oral Daily  . phenytoin  300 mg Oral QHS  . Ensure Max Protein  11 oz Oral BID  . senna-docusate  2 tablet Oral QHS  . sodium chloride flush  3 mL Intravenous Q12H  . tamsulosin  0.4 mg Oral QPC supper  . thiamine  100 mg Oral Daily  . Urea  15 g Oral BID

## 2020-04-14 LAB — RENAL FUNCTION PANEL
Albumin: 2.5 g/dL — ABNORMAL LOW (ref 3.5–5.0)
Anion gap: 8 (ref 5–15)
BUN: 23 mg/dL (ref 8–23)
CO2: 26 mmol/L (ref 22–32)
Calcium: 9.2 mg/dL (ref 8.9–10.3)
Chloride: 91 mmol/L — ABNORMAL LOW (ref 98–111)
Creatinine, Ser: 0.83 mg/dL (ref 0.61–1.24)
GFR calc Af Amer: >60 mL/min (ref >60)
GFR calc non Af Amer: >60 mL/min (ref >60)
Glucose, Bld: 105 mg/dL — ABNORMAL HIGH (ref 70–99)
Phosphorus: 5.2 mg/dL — ABNORMAL HIGH (ref 2.5–4.6)
Potassium: 3.8 mmol/L (ref 3.5–5.1)
Sodium: 125 mmol/L — ABNORMAL LOW (ref 135–145)

## 2020-04-14 LAB — GLUCOSE, CAPILLARY
Glucose-Capillary: 90 mg/dL (ref 70–99)
Glucose-Capillary: 88 mg/dL (ref 70–99)
Glucose-Capillary: 110 mg/dL — ABNORMAL HIGH (ref 70–99)
Glucose-Capillary: 126 mg/dL — ABNORMAL HIGH (ref 70–99)

## 2020-04-14 NOTE — TOC Progression Note (Addendum)
Transition of Care [facility]) - Progression Note    Patient Details  Name: Brad Graham MRN: [id number] Date of Birth: Jan 23, 1961  Transition of [facility]) CM/SW Contact  Beckie Busing, RN Phone Number: [phone]  04/14/2020, 2:42 PM  Clinical Narrative:    CM received message from primary nurse to inquire about MD wanting to know or make sure the patient will be able to receive urea packets at [facility] . Message left for [facility]. Will await return call.  Spoke with [facility] and they state that they can only get the 15 GM urea packets. Message to be sent to primary nurse.     Expected Discharge Plan: Long Term Nursing Home Barriers to Discharge: Continued Medical Work up  Expected Discharge Plan and Services Expected Discharge Plan: Long Term Nursing Home In-house Referral: Clinical Social Work   Post Acute Care Choice: Resumption of Svcs/PTA Provider Living arrangements for the past 2 months: Skilled Nursing Facility                                       Social Determinants of Health (SDOH) Interventions    Readmission Risk Interventions Readmission Risk Prevention Plan 03/31/2020  Transportation Screening Complete  Medication Review (RN CM) Complete  Some recent data might be hidden

## 2020-04-14 NOTE — Progress Notes (Signed)
Patient ID: Brad Graham, male   DOB: May 19, 1961, 59 y.o.   MRN: [id number] [facility] Progress Note   Assessment/ Plan:   1. 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  Previously treated with 3% saline and then tolvaptan with improvement in Na. Started on lasix which dropped his Na to 127 now 123 today. Cont to hold diuretics for now especially as he is overall euvolemic on exam. Fluid restrict to ~1L/day. -na 125.  Continue with ure-na to 30g bid -if any drop in na then please check serum osm, urine osm, urine K, urine Na. Wondering if he may have a siadh picture as well from his acute infarct? -replete K prn 2.  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 Pt/ot  3.  Acute exacerbation of diastolic heart failure: hold lasix for now. Monitor daily weights. 4.  Anemia: Likely secondary to acute illness/hospitalization, no overt loss.  Status post intravenous iron 5.  Urinary tract infection: Status post completion of antibiotic therapy.  Anthony Sar, MD [facility]  Subjective:   Sodium better, no acute events. Still has no new complaints.   Objective:   BP 125/74 (BP Location: Right Arm)   Pulse 90   Temp 98.2 F (36.8 C)   Resp 18   Ht 6' (1.829 m)   Wt 105.1 kg   SpO2 94%   BMI 31.42 kg/m  No intake or output data in the 24 hours ending 04/14/20 1349 Weight change:   Physical Exam: Gen: NAD, comfortable, laying in bed flat HEENT: moist mucosal membranes CVS: Pulse regular, normal rate, S1 and S2 normal Resp: cta bl, no w/r/r/c, unlabored, bl chest expansion Abd: Soft, obese, nontender, bowel sounds normal.  Ext: No edema of the lower extremities, compression stockings in place Neuro: awake, alert, moves all ext spontaneously, speech clear and coherent, AAOX3  Imaging: No results found.  Labs: BMET Recent Labs  Lab 04/08/20 0631  04/08/20 1710 04/09/20 0145 04/09/20 0145 04/09/20 1203 04/10/20 0256 04/11/20 1038 04/12/20 0325 04/12/20 1842 04/13/20 0548 04/14/20 0902  NA 126*  126*   < > 127*   < > 130* 129* 127* 125* 123* 123* 125*  K 4.0  --  3.8  --   --  4.2 4.0 4.0 3.9 3.8 3.8  CL 91*  --  92*  --   --  95* 92* 91* 92* 90* 91*  CO2 26  --  25  --   --  24 23 25 22 24 26   GLUCOSE 114*  --  152*  --   --  116* 105* 96 140* 89 105*  BUN 10  --  15  --   --  12 12 11 21 15 23   CREATININE 0.72  --  0.72  --   --  0.79 0.75 0.76 0.97 0.81  0.78 0.83  CALCIUM 9.0  --  9.2  --   --  9.7 9.6 9.2 9.2 9.1 9.2  PHOS 4.5  --  4.9*  --   --  5.3* 5.3* 5.0*  --  4.8* 5.2*   < > = values in this interval not displayed.   CBC Recent Labs  Lab 04/11/20 1038  WBC 4.5  HGB 8.9*  HCT 28.8*  MCV 84.0  PLT 403*    Medications:    . aspirin EC  81 mg Oral Daily  . atorvastatin  80 mg Oral q1800  . carvedilol  25 mg Oral BID WC  . Chlorhexidine Gluconate Cloth  6 each Topical Daily  . cholecalciferol  2,000 Units Oral q morning - 10a  . clopidogrel  75 mg Oral Daily  . enoxaparin (LOVENOX) injection  55 mg Subcutaneous Q24H  . folic acid  1 mg Oral Daily  . insulin aspart  0-9 Units Subcutaneous TID WC  . magnesium oxide  800 mg Oral BID  . pantoprazole  40 mg Oral Daily  . phenytoin  200 mg Oral Daily  . phenytoin  300 mg Oral QHS  . Ensure Max Protein  11 oz Oral BID  . senna-docusate  2 tablet Oral QHS  . sodium chloride flush  3 mL Intravenous Q12H  . tamsulosin  0.4 mg Oral QPC supper  . thiamine  100 mg Oral Daily  . Urea  30 g Oral BID

## 2020-04-14 NOTE — Progress Notes (Signed)
PROGRESS NOTE    Brad Graham  ZOX:[id number] DOB: 09/15/1961 DOA: 03/30/2020 PCP: Mardene CelesteAsaro, Jessica I, NP   Brief Narrative:   59 y.o.malewith medical history significantfor a recent hospitalization at [facility](6/9-6/21)for cardiac arrest requiring intubation acute renal failure aspiration pneumonia who was treated in the ICU for several days and subsequently extubated and eventually discharged [facility]. 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 He has a chronic seizure disorder and is maintained on Dilantin. He has type 2 diabetes mellitus and hypertension.  He was admitted with acute mental status changes and severe hyponatremia with sodium of 109.His sodium levels were being monitored at the SNF reportedly and they had recommended giving him IV fluids however the patient refused and he was given oral sodium tablets. He had been taking those reportedly during the last week. As his Na slowly improved, so did his mentation. In am of 04/08/20, patient woke up with some right arm weakness. 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 [facility].Work up complete. D/c back to SNF when Na stable.   Assessment & Plan:   Principal Problem:   Hyponatremia Active Problems:   Essential hypertension   Diabetes mellitus without complication (HCC)   Hyperlipidemia   Proteinuria   History of Cardiopulmonary arrest    Acute encephalopathy   Hyperglycemia   Volume overload   Alcohol abuse   Centrencephalic epilepsy (HCC)   Hemiparesis affecting left side as late effect of cerebrovascular accident   Bilateral leg edema   Sinus tachycardia   UTI (urinary tract infection)   DNR (do not resuscitate)   Acute on chronic diastolic CHF (congestive heart failure)  (HCC)   Cerebral thrombosis with cerebral infarction   Hyponatremia-ongoing; improving -Initially improved with IV Lasix, then trended down again. Nephrology consulted -Negative balance of 24 L, IV fluids discontinued per renal -Received tolvaptan 7.5 mg on 7/15, 15 mg on 7/16, nephrology to redose 15 mg on 7/17 today -got lasix x 2 PO and Na dropped to 127--lasix held -Ure-Na recommended by pharmacy -Urea to 30mg  PO BID today per Nephrology -Fluid restrict to less than 1L per day; could have possible SIADH related to CVA  Acute on chronic diastolic CHF -2D echo showed EF of 70-75% with mild LVH, indeterminate diastolic filling -Fluid management per renal, negative balance of 24 L -Holding lasix for now -Monitor daily weights -Fluid restriction as above  Right hemiparesis/acute ischemic stroke -Patient was noted to have some right-sided weakness 04/09/2019  -MRI brain was obtained which showed acute infarct in the left frontal gyrus. 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 Estimated degree of stenosis in the internal carotid arteries is less than 50% bilaterally. -asa plus plavix as not a candidate for long term anticoagulation so no TEE or loop -on Lipitor 20 mg daily PTA, increased to 80 mg daily   E. coli UTI-resolved -IV ceftriaxone, last dose on 04/02/2020  Acute metabolic encephalopathy -Likely due to UTI, hyponatremia, overall improved, near his baseline  History of seizure disorder Continue Dilantin  History of alcohol use Abstinent for past several weeks, [facility]  Diabetes mellitus type 2 -Hemoglobin A1c  7.1 -Placed on sliding scale insulin, carb modified diet  Essential hypertension -Continue Coreg Amlodipine, hydralazine on hold  Anemia of chronic  disease -trend  Obesity Estimated body mass index is 31.42 kg/m as calculated from the following: Height as of this encounter: 6' (1.829 m). Weight as of this encounter: 105.1 kg.  Code Status:DNR DVT Prophylaxis:Lovenox  Family Communication:Discussed all imaging results, lab results, explained to the patient    Disposition Plan:Status is: Inpatient  Remains inpatient appropriate because:Inpatient level of care appropriate due to severity of illness   Dispo: The patient is from: Home Anticipated d/c is to: [facility] Anticipated d/c date is: 1 days Patient currently is not medically stable to d/c.- need ongoing recommendations from nephrology. Likely Dc in am if Na>126mg /dL  Consultants:   Nephrology  Neurology-now s/o  Procedures:   See below  Antimicrobials:  Anti-infectives (From admission, onward)   Start     Dose/Rate Route Frequency Ordered Stop   03/30/20 1830  cefTRIAXone (ROCEPHIN) 1 g in sodium chloride 0.9 % 100 mL IVPB        1 g 200 mL/hr over 30 Minutes Intravenous Every 24 hours 03/30/20 1800 04/02/20 1808       Subjective: Patient seen and evaluated today with no new acute complaints or concerns. No acute concerns or events noted overnight.  Objective: Vitals:   04/13/20 0753 04/13/20 1645 04/13/20 2100 04/14/20 0751  BP: 121/69 (!) 159/138 118/73 125/74  Pulse: 89 79 79 90  Resp: 20 18 12 18   Temp: 98.8 F (37.1 C) 98.4 F (36.9 C) 98.1 F (36.7 C) 98.2 F (36.8 C)  TempSrc:   Oral   SpO2: 99% 97% 98% 94%  Weight:      Height:       No intake or output data in the 24 hours ending 04/14/20 1137 Filed Weights   04/07/20 0408 04/09/20 0639 04/11/20 0500  Weight: 106.8 kg 105.4 kg 105.1 kg    Examination:  General exam: Appears calm and comfortable  Respiratory system: Clear to auscultation. Respiratory effort normal. Cardiovascular system: S1 & S2 heard, RRR. No  JVD, murmurs, rubs, gallops or clicks. No pedal edema. Gastrointestinal system: Abdomen is nondistended, soft and nontender. No organomegaly or masses felt. Normal bowel sounds heard. Central nervous system: Somnolent but arousable. Extremities: No edema Skin: No rashes, lesions or ulcers Psychiatry: Difficult to assess    Data Reviewed: I have personally reviewed following labs and imaging studies  CBC: Recent Labs  Lab 04/11/20 1038  WBC 4.5  HGB 8.9*  HCT 28.8*  MCV 84.0  PLT 403*   Basic Metabolic Panel: Recent Labs  Lab 04/08/20 0631 04/08/20 1710 04/10/20 0256 04/10/20 0256 04/11/20 1038 04/12/20 0325 04/12/20 1842 04/13/20 0548 04/14/20 0902  NA 126*  126*   < > 129*   < > 127* 125* 123* 123* 125*  K 4.0   < > 4.2   < > 4.0 4.0 3.9 3.8 3.8  CL 91*   < > 95*   < > 92* 91* 92* 90* 91*  CO2 26   < > 24   < > 23 25 22 24 26   GLUCOSE 114*   < > 116*   < > 105* 96 140* 89 105*  BUN 10   < > 12   < > 12 11 21 15 23   CREATININE 0.72   < > 0.79   < > 0.75 0.76 0.97 0.81  0.78 0.83  CALCIUM 9.0   < >  9.7   < > 9.6 9.2 9.2 9.1 9.2  MG 1.6*  --   --   --   --   --   --   --   --   PHOS 4.5   < > 5.3*  --  5.3* 5.0*  --  4.8* 5.2*   < > = values in this interval not displayed.   GFR: Estimated Creatinine Clearance: 117.1 mL/min (by C-G formula based on SCr of 0.83 mg/dL). Liver Function Tests: Recent Labs  Lab 04/10/20 0256 04/11/20 1038 04/12/20 0325 04/13/20 0548 04/14/20 0902  ALBUMIN 2.4* 2.5* 2.4* 2.4* 2.5*   No results for input(s): LIPASE, AMYLASE in the last 168 hours. No results for input(s): AMMONIA in the last 168 hours. Coagulation Profile: No results for input(s): INR, PROTIME in the last 168 hours. Cardiac Enzymes: No results for input(s): CKTOTAL, CKMB, CKMBINDEX, TROPONINI in the last 168 hours. BNP (last 3 results) No results for input(s): PROBNP in the last 8760 hours. HbA1C: No results for input(s): HGBA1C in the last 72  hours. CBG: Recent Labs  Lab 04/13/20 0755 04/13/20 1146 04/13/20 1646 04/13/20 2102 04/14/20 0751  GLUCAP 81 137* 98 130* 90   Lipid Profile: No results for input(s): CHOL, HDL, LDLCALC, TRIG, CHOLHDL, LDLDIRECT in the last 72 hours. Thyroid Function Tests: No results for input(s): TSH, T4TOTAL, FREET4, T3FREE, THYROIDAB in the last 72 hours. Anemia Panel: No results for input(s): VITAMINB12, FOLATE, FERRITIN, TIBC, IRON, RETICCTPCT in the last 72 hours. Sepsis Labs: No results for input(s): PROCALCITON, LATICACIDVEN in the last 168 hours.  Recent Results (from the past 240 hour(s))  SARS Coronavirus 2 by RT PCR (hospital order, performed in [facility] hospital lab) Nasopharyngeal Nasopharyngeal Swab     Status: None   Collection Time: 04/08/20  7:30 PM   Specimen: Nasopharyngeal Swab  Result Value Ref Range Status   SARS Coronavirus 2 NEGATIVE NEGATIVE Final    Comment: (NOTE) SARS-CoV-2 target nucleic acids are NOT DETECTED.  The SARS-CoV-2 RNA is generally detectable in upper and lower respiratory specimens during the acute phase of infection. The lowest concentration of SARS-CoV-2 viral copies this assay can detect is 250 copies / mL. A negative result does not preclude SARS-CoV-2 infection and should not be used as the sole basis for treatment or other patient management decisions.  A negative result may occur with improper specimen collection / handling, submission of specimen other than nasopharyngeal swab, presence of viral mutation(s) within the areas targeted by this assay, and inadequate number of viral copies (<250 copies / mL). A negative result must be combined with clinical observations, patient history, and epidemiological information.  Fact Sheet for Patients:   BoilerBrush.com.cy  Fact Sheet for Healthcare Providers: https://pope.com/  This test is not yet approved or  cleared by the [place] FDA  and has been authorized for detection and/or diagnosis of SARS-CoV-2 by FDA under an Emergency Use Authorization (EUA).  This EUA will remain in effect (meaning this test can be used) for the duration of the COVID-19 declaration under Section 564(b)(1) of the Act, 21 U.S.C. section 360bbb-3(b)(1), unless the authorization is terminated or revoked sooner.  Performed at [facility], [address], [place], [place] [address]          Radiology Studies: No results found.      Scheduled Meds: . aspirin EC  81 mg Oral Daily  . atorvastatin  80 mg Oral q1800  . carvedilol  25 mg Oral BID  WC  . Chlorhexidine Gluconate Cloth  6 each Topical Daily  . cholecalciferol  2,000 Units Oral q morning - 10a  . clopidogrel  75 mg Oral Daily  . enoxaparin (LOVENOX) injection  55 mg Subcutaneous Q24H  . folic acid  1 mg Oral Daily  . insulin aspart  0-9 Units Subcutaneous TID WC  . magnesium oxide  800 mg Oral BID  . pantoprazole  40 mg Oral Daily  . phenytoin  200 mg Oral Daily  . phenytoin  300 mg Oral QHS  . Ensure Max Protein  11 oz Oral BID  . senna-docusate  2 tablet Oral QHS  . sodium chloride flush  3 mL Intravenous Q12H  . tamsulosin  0.4 mg Oral QPC supper  . thiamine  100 mg Oral Daily  . Urea  30 g Oral BID    LOS: 15 days    Time spent: 30 minutes    Atalya Dano Hoover Brunette, DO [facility]  If 7PM-7AM, please contact night-coverage [email or web address] 04/14/2020, 11:37 AM

## 2020-04-14 NOTE — Progress Notes (Signed)
Physical Therapy Treatment Patient Details Name: Brad Graham MRN: [id number] DOB: 18-Aug-1961 Today's Date: 04/14/2020    History of Present Illness 59 y.o. male with medical history significant for a recent hospitalization at [facility] (6/9-6/21) for cardiac arrest requiring intubation acute renal failure aspiration pneumonia who was treated in the ICU for several days and subsequently extubated and eventually discharged to [facility]. Admitted to [facility] on 7/7 for AMS secondary to hyponatremia, transferred to [facility] on 7/16 for acute L frontal gyrus infarct. PMH includes CVA with L hemiparesis, ETOH abuse, HTN, HLD, epilepsy, DM.    PT Comments    Pt making slow but steady progress towards PT goals, tolerating repeated transfer training and LE exercises for strengthening. Pt declined ambulation this day due to fatigue. PT to continue to recommend SNF level of care post-acutely, will continue to follow.    Follow Up Recommendations  SNF     Equipment Recommendations  Other (comment) (defer)    Recommendations for Other Services       Precautions / Restrictions Precautions Precautions: Fall Restrictions Weight Bearing Restrictions: No    Mobility  Bed Mobility Overal bed mobility: Needs Assistance Bed Mobility: Supine to Sit     Supine to sit: Min guard;HOB elevated     General bed mobility comments: min guard for safety, increased time and use of bedrails/HOB elevation to perform.  Transfers Overall transfer level: Needs assistance Equipment used: Rolling walker (2 wheeled) Transfers: Sit to/from [employer] Sit to Stand: Min assist;From elevated surface Stand pivot transfers: Mod assist;From elevated surface       General transfer comment: Min assist for power up, steadying upon standing. Sit to stand x2 for LE strengthening, pt declines further sit to stands after this. Requires mod assist for transfer to recliner at bedside for  staedying, correcting heavy posterior leaning, and guiding pt hips into recliner as pt attempted to sit prematurely. Verbal cuing for correct hand placement when rising/sitting (followed 50% of the time), lean forward.  Ambulation/Gait             General Gait Details: pt declines   Stairs             Wheelchair Mobility    Modified Rankin (Stroke Patients Only) Modified Rankin (Stroke Patients Only) Pre-Morbid Rankin Score: Moderately severe disability Modified Rankin: Moderately severe disability     Balance Overall balance assessment: Needs assistance Sitting-balance support: No upper extremity supported Sitting balance-Leahy Scale: Fair   Postural control: Posterior lean Standing balance support: Bilateral upper extremity supported;During functional activity Standing balance-Leahy Scale: Poor Standing balance comment: requires at least min assist in standing to correct posterior leaning, right posture as pt leaning L/R and lifting RW unpredictably                            Cognition Arousal/Alertness: Awake/alert Behavior During Therapy: WFL for tasks assessed/performed Overall Cognitive Status: History of cognitive impairments - at baseline                                 General Comments: A&Ox4, lacks safety awareness and follows safety commands very inconsistently      Exercises General Exercises - Lower Extremity Long Arc Quad: AAROM;Both;15 reps;Seated Hip Flexion/Marching: Both;15 reps;AAROM;Seated    General Comments        Pertinent Vitals/Pain Pain Assessment: No/denies pain Pain  Intervention(s): Monitored during session    Home Living                      Prior Function            PT Goals (current goals can now be found in the care plan section) Acute Rehab PT Goals Patient Stated Goal: back to rehab PT Goal Formulation: With patient Time For Goal Achievement: 04/25/20 Potential to Achieve  Goals: Good Progress towards PT goals: Progressing toward goals    Frequency    Min 3X/week      PT Plan Current plan remains appropriate    Co-evaluation              AM-PAC PT "6 Clicks" Mobility   Outcome Measure  Help needed turning from your back to your side while in a flat bed without using bedrails?: A Little Help needed moving from lying on your back to sitting on the side of a flat bed without using bedrails?: A Little Help needed moving to and from a bed to a chair (including a wheelchair)?: A Lot Help needed standing up from a chair using your arms (e.g., wheelchair or bedside chair)?: A Little Help needed to walk in hospital room?: A Lot Help needed climbing 3-5 steps with a railing? : Total 6 Click Score: 14    End of Session Equipment Utilized During Treatment: Gait belt Activity Tolerance: Patient tolerated treatment well;Patient limited by fatigue Patient left: in chair;with call bell/phone within reach;with chair alarm set Nurse Communication: Mobility status PT Visit Diagnosis: Difficulty in walking, not elsewhere classified (R26.2);Other symptoms and signs involving the nervous system (R29.898)     Time: 1452-1510 PT Time Calculation (min) (ACUTE ONLY): 18 min  Charges:  $Therapeutic Activity: 8-22 mins                     Baudelia Schroepfer E, PT Acute Rehabilitation Services Pager [phone]  Office [phone]   Tyrone Apple D Despina Hidden 04/14/2020, 3:24 PM

## 2020-04-15 LAB — PHENYTOIN LEVEL, FREE AND TOTAL
Phenytoin, Free: 0.9 ug/mL — ABNORMAL LOW (ref 1.0–2.0)
Phenytoin, Total: 9 ug/mL — ABNORMAL LOW (ref 10.0–20.0)

## 2020-04-15 LAB — RENAL FUNCTION PANEL
Albumin: 2.6 g/dL — ABNORMAL LOW (ref 3.5–5.0)
Anion gap: 8 (ref 5–15)
BUN: 29 mg/dL — ABNORMAL HIGH (ref 8–23)
CO2: 26 mmol/L (ref 22–32)
Calcium: 9.4 mg/dL (ref 8.9–10.3)
Chloride: 94 mmol/L — ABNORMAL LOW (ref 98–111)
Creatinine, Ser: 0.77 mg/dL (ref 0.61–1.24)
GFR calc Af Amer: >60 mL/min (ref >60)
GFR calc non Af Amer: >60 mL/min (ref >60)
Glucose, Bld: 98 mg/dL (ref 70–99)
Phosphorus: 5 mg/dL — ABNORMAL HIGH (ref 2.5–4.6)
Potassium: 3.7 mmol/L (ref 3.5–5.1)
Sodium: 128 mmol/L — ABNORMAL LOW (ref 135–145)

## 2020-04-15 LAB — SARS CORONAVIRUS 2 BY RT PCR (HOSPITAL ORDER, PERFORMED IN ~~LOC~~ HOSPITAL LAB): SARS Coronavirus 2: NEGATIVE

## 2020-04-15 LAB — GLUCOSE, CAPILLARY: Glucose-Capillary: 97 mg/dL (ref 70–99)

## 2020-04-15 MED ORDER — SENNOSIDES-DOCUSATE SODIUM 8.6-50 MG PO TABS: 2.0000 | ORAL_TABLET | Freq: Every day | ORAL | 0 refills | Status: DC

## 2020-04-15 MED ORDER — PHENYTOIN SODIUM EXTENDED 200 MG PO CAPS: 200.0000 mg | ORAL_CAPSULE | Freq: Every day | ORAL | 0 refills | Status: DC

## 2020-04-15 MED ORDER — URE-NA 15 G PO PACK: 30.0000 g | PACK | Freq: Two times a day (BID) | ORAL | 0 refills | Status: AC

## 2020-04-15 MED ORDER — PANTOPRAZOLE SODIUM 40 MG PO TBEC: 40.0000 mg | DELAYED_RELEASE_TABLET | Freq: Every day | ORAL | 0 refills | Status: DC

## 2020-04-15 MED ORDER — ATORVASTATIN CALCIUM 80 MG PO TABS: 80.0000 mg | ORAL_TABLET | Freq: Every day | ORAL | 0 refills | Status: DC

## 2020-04-15 MED ORDER — ASPIRIN 81 MG PO TBEC: 81.0000 mg | DELAYED_RELEASE_TABLET | Freq: Every day | ORAL | 0 refills | Status: AC

## 2020-04-15 MED ORDER — CLOPIDOGREL BISULFATE 75 MG PO TABS: 75.0000 mg | ORAL_TABLET | Freq: Every day | ORAL | 3 refills | Status: AC

## 2020-04-15 MED ORDER — PHENYTOIN SODIUM EXTENDED 300 MG PO CAPS: 300.0000 mg | ORAL_CAPSULE | Freq: Every day | ORAL | 0 refills | Status: DC

## 2020-04-15 NOTE — TOC Transition Note (Signed)
Transition of Care [facility]) - CM/SW Discharge Note   Patient Details  Name: Brad Graham MRN: [id number] Date of Birth: [birth year]  [facility] CM[facility]SW Contact:  Beckie Busing, RN Phone Number: [phone]  04/15/2020, 2:48 PM   Clinical Narrative:   Patient to discharge back to [facility]. Transport has been set up via [facility].Uncle oluwasemilore bahl has been made aware.  Please call report to  [facility] RM # [id number]  Report [phone]    Final next level of care: Skilled Nursing Facility Barriers to Discharge: No Barriers Identified   Patient Goals and CMS Choice        Discharge Placement              Patient chooses bed at: [facility] Patient to be transferred to facility by: [facility] Name of family member notified: Xavior Niazi Patient and family notified of of transfer: 04/15/20  Discharge Plan and Services In-house Referral: Clinical Social Work   Post Acute Care Choice: Resumption of Svcs/PTA Provider          DME Arranged: N/A DME Agency: NA         HH Agency: NA        Social Determinants of Health (SDOH) Interventions     Readmission Risk Interventions Readmission Risk Prevention Plan 03/31/2020  Transportation Screening Complete  Medication Review (RN CM) Complete  Some recent data might be hidden

## 2020-04-15 NOTE — Discharge Summary (Signed)
Physician Discharge Summary  Brad Graham ZOX:[id number] DOB: 1961-07-05 DOA: 03/30/2020  PCP: Mardene Celeste I, NP  Admit date: 03/30/2020  Discharge date: 04/15/2020  Admitted From:SNF  Disposition:  SNF  Recommendations for Outpatient Follow-up:  1. Follow up with PCP in 1-2 weeks 2. Repeat serum sodium 7/26 to follow sodium levels 3. Follow-up with neurology in 4 weeks 4. Follow-up with nephrology which will be scheduled in 1 week 5. Maintain on fluid restriction of 1500 mL daily and avoid Lasix 6. Remain on urea as prescribed 30g twice daily 7. Remain on aspirin and Plavix for 3 weeks and then Plavix daily thereafter 8. Remain on statin as prescribed 9. Remain on Coreg and avoid amlodipine and hydralazine for now and follow subspecialists  Home Health: None  Equipment/Devices: None  Discharge Condition: Stable  CODE STATUS: DNR  Diet recommendation: Heart Healthy/carb modified  Brief/Interim Summary: 59 y.o.malewith medical history significantfor a recent hospitalization at [facility](6/9-6/[facility])for cardiac arrest requiring intubation acute renal failure aspiration pneumonia who was treated in the ICU for several days and subsequently extubated and eventually discharged to [facility]. 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 He has a chronic seizure disorder and is maintained on Dilantin. He has type 2 diabetes mellitus and hypertension.  He was admitted with acute mental status changes and severe hyponatremia with sodium of 109.His sodium levels were being monitored at the SNF reportedly and they had recommended giving him IV fluids however the patient refused and he was given oral sodium tablets. He had been taking those reportedly during the last week. As his Na slowly improved, so did his  mentation. In am of 04/08/20, patient woke up with some right arm weakness. 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 He was [name] for neurology evaluation. Patient now stable for discharge back to SNF since sodium levels have improved.  Hyponatremia-ongoing; improving -He is asymptomatic and this has improved to a level of 128 today. -Continue on urea 30 g twice daily as recommended by nephrology -Continue fluid restriction as recommended above -Hold Lasix -Recheck serum sodium on 7/26 and follow-up with nephrology as recommended.  Acute on chronic diastolic CHF -2D echo showed EF of 70-75% with mild LVH, indeterminate diastolic filling -Currently on fluid restriction with Lasix being held -No signs of volume overload noted  Right hemiparesis/acute ischemic stroke -Patient was noted to have some right-sided weakness 04/08/2020  -MRI brain was obtained which showed acute infarct in the left frontal gyrus. 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 Estimated degree of stenosis in the internal carotid arteries is less than 50% bilaterally. -asa plus plavix for total 3 weeks and then Plavix thereafter -on Lipitor 20 mg daily PTA, increased to 80 mg daily  -Follow-up with neurology as scheduled in 4 weeks  E. coli UTI-resolved -IV ceftriaxone, last dose on 04/02/2020  Acute metabolic encephalopathy-improved -Likely due to UTI, hyponatremia, overall improved, near his baseline  History of seizure disorder Continue Dilantin as prescribed  History of alcohol use Abstinent for past several weeks, resident at [facility]  Diabetes mellitus type 2 -Hemoglobin A1c 7.1 -May resume home Metformin -Monitor closely  Essential hypertension-controlled -Continue  Coreg  Amlodipine, hydralazine on hold  Anemia of chronic disease -Stable  Obesity Estimated body mass index is 31.42 kg/m as calculated from the following: Height as of this encounter: 6' (1.829 m). Weight as of this encounter: 105.1 kg.   Discharge Diagnoses:  Principal Problem:   Hyponatremia Active Problems:   Essential hypertension   Diabetes mellitus without complication (HCC)   Hyperlipidemia   Proteinuria   History of Cardiopulmonary arrest    Acute encephalopathy   Hyperglycemia   Volume overload   Alcohol abuse   Centrencephalic epilepsy (HCC)   Hemiparesis affecting left side as late effect of cerebrovascular accident   Bilateral leg edema   Sinus tachycardia   UTI (urinary tract infection)   DNR (do not resuscitate)   Acute on chronic diastolic CHF (congestive heart failure) (HCC)   Cerebral thrombosis with cerebral infarction    Discharge Instructions  Discharge Instructions    Ambulatory referral to Nephrology   Complete by: As directed    Hospital follow-up for hyponatremia.   Ambulatory referral to Neurology   Complete by: As directed    Follow up in stroke clinic at [facility] with Ihor Austin, NP in about 4 weeks. If not available, consider Dr. Delia Heady, Dr. Jamelle Rushing, or Dr. Naomie Dean.   Diet - low sodium heart healthy   Complete by: As directed    If the dressing is still on your incision site when you go home, remove it on the third day after your surgery date. Remove dressing if it begins to fall off, or if it is dirty or damaged before the third day.   Complete by: As directed    Increase activity slowly   Complete by: As directed      Allergies as of 04/15/2020   No Known Allergies     Medication List    STOP taking these medications   amLODipine 10 MG tablet Commonly known as: NORVASC   Ativan 2 MG/ML injection Generic drug: LORazepam   Dilantin Infatabs 50 MG tablet Generic drug:  phenytoin   hydrALAZINE 25 MG tablet Commonly known as: APRESOLINE   sodium chloride 1 g tablet     TAKE these medications   aspirin 81 MG EC tablet Take 1 tablet (81 mg total) by mouth daily for 21 days. Swallow whole. Start taking on: April 16, 2020 What changed: additional instructions   atorvastatin 80 MG tablet Commonly known as: LIPITOR Take 1 tablet (80 mg total) by mouth daily at 6 PM. What changed:   medication strength  how much to take   carvedilol 25 MG tablet Commonly known as: COREG Take 1 tablet (25 mg total) by mouth 2 (two) times daily with a meal.   clopidogrel 75 MG tablet Commonly known as: PLAVIX Take 1 tablet (75 mg total) by mouth daily. Start taking on: April 16, 2020   folic acid 1 MG tablet Commonly known as: FOLVITE Take 1 tablet (1 mg total) by mouth daily.   magnesium oxide 400 (241.3 Mg) MG tablet Commonly known as: MAG-OX Take 1 tablet (400 mg total) by mouth 2 (two) times daily.   metFORMIN 1000 MG tablet Commonly known as: Glucophage Take 1 tablet (1,000 mg total) by mouth daily with breakfast.   OXYGEN Inhale 2 L into the lungs daily as needed.   pantoprazole 40 MG tablet Commonly known as: PROTONIX Take 1 tablet (40 mg total) by mouth daily. Start taking on: April 16, 2020   phenytoin 300 MG  ER capsule Commonly known as: DILANTIN Take 1 capsule (300 mg total) by mouth at bedtime.   phenytoin 200 MG ER capsule Commonly known as: DILANTIN Take 1 capsule (200 mg total) by mouth daily. Start taking on: April 16, 2020   senna-docusate 8.6-50 MG tablet Commonly known as: Senokot-S Take 2 tablets by mouth at bedtime.   tamsulosin 0.4 MG Caps capsule Commonly known as: FLOMAX Take 1 capsule (0.4 mg total) by mouth daily after supper.   thiamine 100 MG tablet Take 1 tablet (100 mg total) by mouth daily.   Ure-Na 15 g Pack Generic drug: Urea Take 30 g by mouth 2 (two) times daily for 15 days.   Vitamin D 50 MCG (2000 UT)  Caps Take 1 capsule by mouth every morning.            Discharge Care Instructions  (From admission, onward)         Start     Ordered   04/15/20 0000  If the dressing is still on your incision site when you go home, remove it on the third day after your surgery date. Remove dressing if it begins to fall off, or if it is dirty or damaged before the third day.        04/15/20 1005          Follow-up Information    [facility] Follow up in 4 week(s).   Specialty: Neurology Why: stroke clinic. office will call with appt date and time  Contact information: [address] [place] [id number] [phone]       Mardene CelesteAsaro, Jessica I, NP Follow up in 3 day(s).   Specialty: Nurse Practitioner Contact information: [address] [place] [address] [phone]        Anthony SarSingh, Vikas, MD Follow up in 1 week(s).   Specialty: Nephrology Contact information: [address] [place] [address] [phone]              No Known Allergies  Consultations:  Nephrology  Neurology   Procedures/Studies: CT HEAD WO CONTRAST  Result Date: 04/09/2020 CLINICAL DATA:  Neuro deficit subacute.  Unresponsive. EXAM: CT HEAD WITHOUT CONTRAST TECHNIQUE: Contiguous axial images were obtained from the base of the skull through the vertex without intravenous contrast. COMPARISON:  CT head 03/30/2020 FINDINGS: Brain: Generalized atrophy. Chronic microvascular ischemic changes in the white matter stable. Chronic infarct in the right external capsule unchanged. Negative for acute infarct, hemorrhage, mass Vascular: Negative for hyperdense vessel Skull: Negative Sinuses/Orbits: Paranasal sinuses clear.  Negative orbit. Other: None IMPRESSION: Atrophy and chronic ischemia. No acute abnormality and no change from the recent CT. Electronically Signed   By: Marlan Palauharles  Clark M.D.   On: 04/09/2020 13:01   CT Head Wo Contrast  Result Date: 03/30/2020 CLINICAL  DATA:  Encephalopathy. Additional provided: Elevated sodium levels, lethargic. EXAM: CT HEAD WITHOUT CONTRAST TECHNIQUE: Contiguous axial images were obtained from the base of the skull through the vertex without intravenous contrast. COMPARISON:  Head CT examination 03/03/2020 and earlier FINDINGS: Brain: The examination is mild to moderately motion degraded. Stable, mild generalized parenchymal atrophy. 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 Redemonstrated chronic infarct within the right external capsule. Unchanged moderate patchy hypodensity within the cerebral white matter which is nonspecific, but consistent with chronic small vessel ischemic disease. There is no acute intracranial hemorrhage. No demarcated cortical infarct is identified. No extra-axial fluid collection. No evidence of intracranial mass.  No midline shift. Incidentally noted cavum septum pellucidum and cavum vergae. Vascular: No hyperdense vessel.  Atherosclerotic calcifications. Skull: Normal. Negative for fracture or focal lesion. Sinuses/Orbits: Visualized orbits show no acute finding. Mild ethmoid sinus mucosal thickening. Moderate right maxillary sinus mucosal thickening. No significant mastoid effusion. IMPRESSION: Mild-to-moderate motion degradation. No evidence of acute intracranial abnormality. Stable generalized parenchymal atrophy and chronic small vessel ischemic disease. Paranasal sinus mucosal thickening as described, most notably right maxillary. Electronically Signed   By: Jackey Loge DO   On: 03/30/2020 10:34   MR ANGIO HEAD WO CONTRAST  Result Date: 04/08/2020 CLINICAL DATA:  Right arm weakness EXAM: MRI HEAD WITHOUT CONTRAST MRA HEAD WITHOUT CONTRAST TECHNIQUE: Multiplanar, multiecho pulse sequences of the brain and surrounding structures were obtained without intravenous contrast. Angiographic images of the head were obtained using MRA technique without contrast.  COMPARISON:  2015 FINDINGS: MRI HEAD Motion artifact is present. Brain: A punctate focus of cortical reduced diffusion is present along the left superior frontal gyrus. Prominence of the ventricles and sulci reflects generalized parenchymal volume loss, which appears progressed since 2015. Patchy and confluent areas of T2 hyperintensity in the supratentorial and pontine white matter are nonspecific but probably reflect moderate chronic microvascular ischemic changes. 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 Additional chronic small vessel infarct of the left centrum semiovale. A few foci of susceptibility along the left basal ganglia likely reflect chronic microhemorrhages. There is no intracranial mass, mass effect, or edema. There is no hydrocephalus or extra-axial fluid collection. Vascular: Major vessel flow voids at the skull base are preserved. Skull and upper cervical spine: Normal marrow signal is preserved. Sinuses/Orbits: Paranasal sinuses are aerated. Orbits are unremarkable. Other: Sella is unremarkable.  Mastoid air cells are clear. MRA HEAD Motion artifact is present. Intracranial internal carotid arteries are patent with atherosclerotic irregularity and potentially moderate to marked stenosis in the paraclinoid regions. Proximal middle and anterior cerebral arteries are patent. Intracranial vertebral arteries, basilar artery, proximal posterior cerebral arteries are patent. IMPRESSION: Motion degraded. Punctate acute cortical infarct of the left superior frontal gyrus. Moderate chronic microvascular ischemic changes. Small chronic infarcts and microhemorrhages as described. No proximal intracranial vessel occlusion. Potential moderate to marked stenosis of the paraclinoid internal carotid arteries bilaterally, which could be overestimated due to artifact. Electronically Signed   By: Guadlupe Spanish M.D.   On: 04/08/2020 16:03   MR BRAIN WO  CONTRAST  Result Date: 04/08/2020 CLINICAL DATA:  Right arm weakness EXAM: MRI HEAD WITHOUT CONTRAST MRA HEAD WITHOUT CONTRAST TECHNIQUE: Multiplanar, multiecho pulse sequences of the brain and surrounding structures were obtained without intravenous contrast. Angiographic images of the head were obtained using MRA technique without contrast. COMPARISON:  2015 FINDINGS: MRI HEAD Motion artifact is present. Brain: A punctate focus of cortical reduced diffusion is present along the left superior frontal gyrus. Prominence of the ventricles and sulci reflects generalized parenchymal volume loss, which appears progressed since 2015. Patchy and confluent areas of T2 hyperintensity in the supratentorial and pontine white matter are nonspecific but probably reflect moderate chronic microvascular ischemic changes. 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 Additional chronic small vessel infarct of the left centrum semiovale. A few foci of susceptibility along the left basal ganglia likely reflect chronic microhemorrhages. There is no intracranial mass, mass effect, or edema. There is no hydrocephalus or extra-axial fluid collection. Vascular: Major vessel flow voids at the skull base  are preserved. Skull and upper cervical spine: Normal marrow signal is preserved. Sinuses/Orbits: Paranasal sinuses are aerated. Orbits are unremarkable. Other: Sella is unremarkable.  Mastoid air cells are clear. MRA HEAD Motion artifact is present. Intracranial internal carotid arteries are patent with atherosclerotic irregularity and potentially moderate to marked stenosis in the paraclinoid regions. Proximal middle and anterior cerebral arteries are patent. Intracranial vertebral arteries, basilar artery, proximal posterior cerebral arteries are patent. IMPRESSION: Motion degraded. Punctate acute cortical infarct of the left superior frontal gyrus. Moderate chronic microvascular  ischemic changes. Small chronic infarcts and microhemorrhages as described. No proximal intracranial vessel occlusion. Potential moderate to marked stenosis of the paraclinoid internal carotid arteries bilaterally, which could be overestimated due to artifact. Electronically Signed   By: Guadlupe Spanish M.D.   On: 04/08/2020 16:03   DG CHEST PORT 1 VIEW  Result Date: 04/01/2020 CLINICAL DATA:  Fever. EXAM: PORTABLE CHEST 1 VIEW COMPARISON:  03/30/2020. FINDINGS: Mediastinum and hilar structures normal. Heart size stable. Low lung volumes. Mild left base atelectasis/infiltrate. No pleural effusion or pneumothorax. No acute bony abnormality. IMPRESSION: Low lung volumes with mild left base atelectasis/infiltrate. Similar findings noted on prior exam. Electronically Signed   By: Maisie Fus  Register   On: 04/01/2020 05:27   DG Chest Portable 1 View  Result Date: 03/30/2020 CLINICAL DATA:  Altered mental status. EXAM: PORTABLE CHEST 1 VIEW COMPARISON:  Prior chest radiograph 03/02/2020 FINDINGS: Mild cardiomegaly, unchanged. Minimal left basilar atelectasis. No appreciable airspace consolidation or frank pulmonary edema. No evidence of pleural effusion or pneumothorax. No acute bony abnormality identified. IMPRESSION: Minimal left basilar atelectasis.  Lungs otherwise clear. Mild cardiomegaly, unchanged. Electronically Signed   By: Jackey Loge DO   On: 03/30/2020 10:36   ECHOCARDIOGRAM COMPLETE  Result Date: 04/09/2020    ECHOCARDIOGRAM REPORT   Patient Name:   Marcha Dutton Date of Exam: 04/09/2020 Medical Rec #:  [id number]        Height:       72.0 in Accession #:    [id number]       Weight:       232.4 lb Date of Birth:  1961-06-15         BSA:          2.271 m Patient Age:    59 years         BP:           126/84 mmHg Patient Gender: M                HR:           94 bpm. [facility] Procedure: 2D Echo Indications:    stroke 434.91  History:        Patient has prior history of Echocardiogram  examinations, most                 recent 03/05/2020. Risk Factors:Hypertension, Diabetes and                 Dyslipidemia. CVA. ETOH Abuse.  Sonographer:    Celene Skeen RDCS (AE) Referring Phys: [phone] DAVID TAT  Sonographer Comments: Image acquisition challenging due to respiratory motion. see comments IMPRESSIONS  1. Left ventricular ejection fraction, by estimation, is 70 to 75%. The left ventricle has normal function. The left ventricle has no regional wall motion abnormalities. There is mild left ventricular hypertrophy. Indeterminate diastolic filling due to E-A fusion.  2. Right ventricular systolic function is normal. The right ventricular  size is mildly enlarged.  3. The mitral valve is abnormal. Trivial mitral valve regurgitation.  4. The aortic valve is grossly normal. Aortic valve regurgitation is not visualized.  5. The inferior vena cava is normal in size with greater than 50% respiratory variability, suggesting right atrial pressure of 3 mmHg. Comparison(s): No significant change from prior study. FINDINGS  Left Ventricle: Left ventricular ejection fraction, by estimation, is 70 to 75%. The left ventricle has normal function. The left ventricle has no regional wall motion abnormalities. The left ventricular internal cavity size was normal in size. There is  mild left ventricular hypertrophy. Indeterminate diastolic filling due to E-A fusion. Right Ventricle: The right ventricular size is mildly enlarged. Right vetricular wall thickness was not assessed. Right ventricular systolic function is normal. Left Atrium: Left atrial size was normal in size. Right Atrium: Right atrial size was normal in size. Pericardium: There is no evidence of pericardial effusion. Mitral Valve: The mitral valve is abnormal. There is mild thickening of the mitral valve leaflet(s). Mild mitral annular calcification. Trivial mitral valve regurgitation. Tricuspid Valve: The tricuspid valve is normal in structure. Tricuspid valve  regurgitation is mild. Aortic Valve: The aortic valve is grossly normal. Aortic valve regurgitation is not visualized. Pulmonic Valve: The pulmonic valve was not well visualized. Pulmonic valve regurgitation is trivial. Aorta: The aortic root is normal in size and structure. Venous: The inferior vena cava is normal in size with greater than 50% respiratory variability, suggesting right atrial pressure of 3 mmHg. IAS/Shunts: The interatrial septum was not well visualized.  LEFT VENTRICLE PLAX 2D LVIDd:         4.27 cm  Diastology LVIDs:         3.04 cm  LV e' medial:   8.05 cm/s LV PW:         1.02 cm  LV E/e' medial: 7.9 LV IVS:        1.21 cm LVOT diam:     2.20 cm LV SV:         50 LV SV Index:   22 LVOT Area:     3.80 cm  LEFT ATRIUM             Index       RIGHT ATRIUM           Index LA diam:        3.00 cm 1.32 cm/m  RA Area:     16.70 cm LA Vol (A2C):   37.4 ml 16.47 ml/m RA Volume:   38.60 ml  17.00 ml/m LA Vol (A4C):   62.5 ml 27.52 ml/m LA Biplane Vol: 50.5 ml 22.24 ml/m  AORTIC VALVE LVOT Vmax:   77.70 cm/s LVOT Vmean:  55.100 cm/s LVOT VTI:    0.132 m  AORTA Ao Root diam: 3.20 cm MITRAL VALVE MV Area (PHT): 2.73 cm    SHUNTS MV Decel Time: 278 msec    Systemic VTI:  0.13 m MV E velocity: 63.40 cm/s  Systemic Diam: 2.20 cm MV A velocity: 64.30 cm/s MV E/A ratio:  0.99 Dietrich Pates MD Electronically signed by Dietrich Pates MD Signature Date/Time: 04/09/2020/10:16:54 AM    Final      Discharge Exam: Vitals:   04/14/20 2341 04/15/20 0822  BP: (!) 144/81 (!) 137/78  Pulse:  86  Resp:    Temp: 98 F (36.7 C) 97.8 F (36.6 C)  SpO2: 98% 100%   Vitals:   04/14/20 1642 04/14/20 2341 04/15/20 0456 04/15/20 1610  BP: (!) 140/89 (!) 144/81  (!) 137/78  Pulse: 77   86  Resp: 18     Temp: (!) 97.5 F (36.4 C) 98 F (36.7 C)  97.8 F (36.6 C)  TempSrc:  Oral  Oral  SpO2: 100% 98%  100%  Weight:   (!) 105.7 kg   Height:        General: Pt is alert, awake, not in acute  distress Cardiovascular: RRR, S1/S2 +, no rubs, no gallops Respiratory: CTA bilaterally, no wheezing, no rhonchi Abdominal: Soft, NT, ND, bowel sounds + Extremities: no edema, no cyanosis    The results of significant diagnostics from this hospitalization (including imaging, microbiology, ancillary and laboratory) are listed below for reference.     Microbiology: Recent Results (from the past 240 hour(s))  SARS Coronavirus 2 by RT PCR (hospital order, performed in [facility] hospital lab) Nasopharyngeal Nasopharyngeal Swab     Status: None   Collection Time: 04/08/20  7:30 PM   Specimen: Nasopharyngeal Swab  Result Value Ref Range Status   SARS Coronavirus 2 NEGATIVE NEGATIVE Final    Comment: (NOTE) SARS-CoV-2 target nucleic acids are NOT DETECTED.  The SARS-CoV-2 RNA is generally detectable in upper and lower respiratory specimens during the acute phase of infection. The lowest concentration of SARS-CoV-2 viral copies this assay can detect is 250 copies / mL. A negative result does not preclude SARS-CoV-2 infection and should not be used as the sole basis for treatment or other patient management decisions.  A negative result may occur with improper specimen collection / handling, submission of specimen other than nasopharyngeal swab, presence of viral mutation(s) within the areas targeted by this assay, and inadequate number of viral copies (<250 copies / mL). A negative result must be combined with clinical observations, patient history, and epidemiological information.  Fact Sheet for Patients:   BoilerBrush.com.cy  Fact Sheet for Healthcare Providers: https://pope.com/  This test is not yet approved or  cleared by the [place] FDA and has been authorized for detection and/or diagnosis of SARS-CoV-2 by FDA under an Emergency Use Authorization (EUA).  This EUA will remain in effect (meaning this test can be used) for  the duration of the COVID-19 declaration under Section 564(b)(1) of the Act, 21 U.S.C. section 360bbb-3(b)(1), unless the authorization is terminated or revoked sooner.  Performed at [facility], [address], [place], [place] [address]      Labs: BNP (last 3 results) No results for input(s): BNP in the last 8760 hours. Basic Metabolic Panel: Recent Labs  Lab 04/11/20 1038 04/11/20 1038 04/12/20 0325 04/12/20 1842 04/13/20 0548 04/14/20 0902 04/15/20 0825  NA 127*   < > 125* 123* 123* 125* 128*  K 4.0   < > 4.0 3.9 3.8 3.8 3.7  CL 92*   < > 91* 92* 90* 91* 94*  CO2 23   < > 25 22 24 26 26   GLUCOSE 105*   < > 96 140* 89 105* 98  BUN 12   < > 11 21 15 23  29*  CREATININE 0.75   < > 0.76 0.97 0.81  0.78 0.83 0.77  CALCIUM 9.6   < > 9.2 9.2 9.1 9.2 9.4  PHOS 5.3*  --  5.0*  --  4.8* 5.2* 5.0*   < > = values in this interval not displayed.   Liver Function Tests: Recent Labs  Lab 04/11/20 1038 04/12/20 0325 04/13/20 0548 04/14/20 0902 04/15/20 0825  ALBUMIN 2.5* 2.4* 2.4* 2.5* 2.6*  No results for input(s): LIPASE, AMYLASE in the last 168 hours. No results for input(s): AMMONIA in the last 168 hours. CBC: Recent Labs  Lab 04/11/20 1038  WBC 4.5  HGB 8.9*  HCT 28.8*  MCV 84.0  PLT 403*   Cardiac Enzymes: No results for input(s): CKTOTAL, CKMB, CKMBINDEX, TROPONINI in the last 168 hours. BNP: Invalid input(s): POCBNP CBG: Recent Labs  Lab 04/14/20 0751 04/14/20 1211 04/14/20 1644 04/14/20 2146 04/15/20 0815  GLUCAP 90 88 110* 126* 97   D-Dimer No results for input(s): DDIMER in the last 72 hours. Hgb A1c No results for input(s): HGBA1C in the last 72 hours. Lipid Profile No results for input(s): CHOL, HDL, LDLCALC, TRIG, CHOLHDL, LDLDIRECT in the last 72 hours. Thyroid function studies No results for input(s): TSH, T4TOTAL, T3FREE, THYROIDAB in the last 72 hours.  Invalid input(s): FREET3 Anemia work up No results for input(s): VITAMINB12,  FOLATE, FERRITIN, TIBC, IRON, RETICCTPCT in the last 72 hours. Urinalysis    Component Value Date/Time   COLORURINE YELLOW 04/04/2020 1650   APPEARANCEUR CLOUDY (A) 04/04/2020 1650   APPEARANCEUR Clear 05/02/2014 0134   LABSPEC 1.009 04/04/2020 1650   LABSPEC 1.004 05/02/2014 0134   PHURINE 8.0 04/04/2020 1650   GLUCOSEU NEGATIVE 04/04/2020 1650   GLUCOSEU Negative 05/02/2014 0134   HGBUR NEGATIVE 04/04/2020 1650   BILIRUBINUR NEGATIVE 04/04/2020 1650   BILIRUBINUR Negative 05/02/2014 0134   KETONESUR NEGATIVE 04/04/2020 1650   PROTEINUR NEGATIVE 04/04/2020 1650   NITRITE NEGATIVE 04/04/2020 1650   LEUKOCYTESUR NEGATIVE 04/04/2020 1650   LEUKOCYTESUR Negative 05/02/2014 0134   Sepsis Labs Invalid input(s): PROCALCITONIN,  WBC,  LACTICIDVEN Microbiology Recent Results (from the past 240 hour(s))  SARS Coronavirus 2 by RT PCR (hospital order, performed in [facility] hospital lab) Nasopharyngeal Nasopharyngeal Swab     Status: None   Collection Time: 04/08/20  7:30 PM   Specimen: Nasopharyngeal Swab  Result Value Ref Range Status   SARS Coronavirus 2 NEGATIVE NEGATIVE Final    Comment: (NOTE) SARS-CoV-2 target nucleic acids are NOT DETECTED.  The SARS-CoV-2 RNA is generally detectable in upper and lower respiratory specimens during the acute phase of infection. The lowest concentration of SARS-CoV-2 viral copies this assay can detect is 250 copies / mL. A negative result does not preclude SARS-CoV-2 infection and should not be used as the sole basis for treatment or other patient management decisions.  A negative result may occur with improper specimen collection / handling, submission of specimen other than nasopharyngeal swab, presence of viral mutation(s) within the areas targeted by this assay, and inadequate number of viral copies (<250 copies / mL). A negative result must be combined with clinical observations, patient history, and epidemiological information.  Fact  Sheet for Patients:   BoilerBrush.com.cy  Fact Sheet for Healthcare Providers: https://pope.com/  This test is not yet approved or  cleared by the [place] FDA and has been authorized for detection and/or diagnosis of SARS-CoV-2 by FDA under an Emergency Use Authorization (EUA).  This EUA will remain in effect (meaning this test can be used) for the duration of the COVID-19 declaration under Section 564(b)(1) of the Act, 21 U.S.C. section 360bbb-3(b)(1), unless the authorization is terminated or revoked sooner.  Performed at [facility], [address], [place], [place] [address]      Time coordinating discharge: 35 minutes  SIGNED:   Erick Blinks, DO [facility] 04/15/2020, 10:05 AM  If 7PM-7AM, please contact night-coverage [email or web address]

## 2020-04-15 NOTE — Progress Notes (Signed)
Patient ID: Brad Graham, male   DOB: 12/17/1960, 59 y.o.   MRN: [id number] [facility] Progress Note   Assessment/ Plan:   1. 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  Previously treated with 3% saline and then tolvaptan with improvement in Na. Started on lasix which dropped his Na to 127 now 123 today. Cont to hold diuretics for now especially as he is overall euvolemic on exam. Fluid restrict to ~1.5 L/day. -na improved to 128.  Continue with ure-na to 30g bid especially on discharge. Should have labs done as an outpatient this coming Monday or Tuesday to ensure stability of sodium levels -Okay from nephrology perspective to be discharged, will set up an appointment at [facility] in the next 2 to 3 weeks -if any drop in na then please check serum osm, urine osm, urine K, urine Na. Wondering if he may have a siadh picture as well from his acute infarct? -replete K prn 2.  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 Pt/ot  3.  Acute exacerbation of diastolic heart failure: hold lasix for now. Monitor daily weights. 4.  Anemia: Likely secondary to acute illness/hospitalization, no overt loss.  Status post intravenous iron 5.  Urinary tract infection: Status post completion of antibiotic therapy.  Discussed with primary service.  Anthony Sar, MD [facility]  Subjective:   Skin continues to improve.  Eager to go home.  Denies any new symptoms nor does have any complaints.   Objective:   BP (!) 137/78   Pulse 86   Temp 97.8 F (36.6 C) (Oral)   Resp 18   Ht 6' (1.829 m)   Wt (!) 105.7 kg   SpO2 100%   BMI 31.60 kg/m   Intake/Output Summary (Last 24 hours) at 04/15/2020 1207 Last data filed at 04/15/2020 0400 Gross per 24 hour  Intake 363 ml  Output 400 ml  Net -37 ml   Weight change:   Physical Exam: Gen: NAD, comfortable, laying in  bed flat HEENT: moist mucosal membranes CVS: Pulse regular, normal rate, S1 and S2 normal Resp: cta bl, no w/r/r/c, unlabored, bl chest expansion Abd: Soft, obese, nontender, bowel sounds normal.  Ext: No edema of the lower extremities, compression stockings in place Neuro: awake, alert, moves all ext spontaneously, speech clear and coherent, AAOX3  Imaging: No results found.  Labs: BMET Recent Labs  Lab 04/09/20 0145 04/09/20 1203 04/10/20 0256 04/11/20 1038 04/12/20 0325 04/12/20 1842 04/13/20 0548 04/14/20 0902 04/15/20 0825  NA 127*   < > 129* 127* 125* 123* 123* 125* 128*  K 3.8  --  4.2 4.0 4.0 3.9 3.8 3.8 3.7  CL 92*  --  95* 92* 91* 92* 90* 91* 94*  CO2 25  --  24 23 25 22 24 26 26   GLUCOSE 152*  --  116* 105* 96 140* 89 105* 98  BUN 15  --  12 12 11 21 15 23  29*  CREATININE 0.72  --  0.79 0.75 0.76 0.97 0.81  0.78 0.83 0.77  CALCIUM 9.2  --  9.7 9.6 9.2 9.2 9.1 9.2 9.4  PHOS 4.9*  --  5.3* 5.3* 5.0*  --  4.8* 5.2* 5.0*   < > = values in this interval not displayed.   CBC Recent Labs  Lab 04/11/20 1038  WBC 4.5  HGB 8.9*  HCT 28.8*  MCV 84.0  PLT 403*    Medications:    .  aspirin EC  81 mg Oral Daily  . atorvastatin  80 mg Oral q1800  . carvedilol  25 mg Oral BID WC  . Chlorhexidine Gluconate Cloth  6 each Topical Daily  . cholecalciferol  2,000 Units Oral q morning - 10a  . clopidogrel  75 mg Oral Daily  . enoxaparin (LOVENOX) injection  55 mg Subcutaneous Q24H  . folic acid  1 mg Oral Daily  . insulin aspart  0-9 Units Subcutaneous TID WC  . magnesium oxide  800 mg Oral BID  . pantoprazole  40 mg Oral Daily  . phenytoin  200 mg Oral Daily  . phenytoin  300 mg Oral QHS  . Ensure Max Protein  11 oz Oral BID  . senna-docusate  2 tablet Oral QHS  . sodium chloride flush  3 mL Intravenous Q12H  . tamsulosin  0.4 mg Oral QPC supper  . thiamine  100 mg Oral Daily  . Urea  30 g Oral BID

## 2020-05-10 ENCOUNTER — Emergency Department (HOSPITAL_COMMUNITY): Payer: Medicaid Other

## 2020-05-10 ENCOUNTER — Emergency Department (HOSPITAL_COMMUNITY)
Admission: EM | Admit: 2020-05-10 | Discharge: 2020-05-10 | Disposition: A | Discharge status: Skilled Nursing Facility | Payer: Medicaid Other | Attending: Emergency Medicine | Admitting: Emergency Medicine

## 2020-05-10 ENCOUNTER — Other Ambulatory Visit: Payer: Self-pay

## 2020-05-10 ENCOUNTER — Encounter (HOSPITAL_COMMUNITY): Payer: Self-pay | Admitting: Emergency Medicine

## 2020-05-10 DIAGNOSIS — R531 Weakness: Secondary | ICD-10-CM | POA: Insufficient documentation

## 2020-05-10 DIAGNOSIS — Z8673 Personal history of transient ischemic attack (TIA), and cerebral infarction without residual deficits: Secondary | ICD-10-CM | POA: Insufficient documentation

## 2020-05-10 DIAGNOSIS — E871 Hypo-osmolality and hyponatremia: Secondary | ICD-10-CM

## 2020-05-10 DIAGNOSIS — F17210 Nicotine dependence, cigarettes, uncomplicated: Secondary | ICD-10-CM | POA: Diagnosis not present

## 2020-05-10 DIAGNOSIS — E119 Type 2 diabetes mellitus without complications: Secondary | ICD-10-CM | POA: Insufficient documentation

## 2020-05-10 DIAGNOSIS — I5032 Chronic diastolic (congestive) heart failure: Secondary | ICD-10-CM | POA: Diagnosis not present

## 2020-05-10 DIAGNOSIS — I110 Hypertensive heart disease with heart failure: Secondary | ICD-10-CM | POA: Diagnosis not present

## 2020-05-10 DIAGNOSIS — Z7984 Long term (current) use of oral hypoglycemic drugs: Secondary | ICD-10-CM | POA: Insufficient documentation

## 2020-05-10 DIAGNOSIS — Z79899 Other long term (current) drug therapy: Secondary | ICD-10-CM | POA: Diagnosis not present

## 2020-05-10 DIAGNOSIS — H538 Other visual disturbances: Secondary | ICD-10-CM | POA: Diagnosis present

## 2020-05-10 LAB — COMPREHENSIVE METABOLIC PANEL
ALT: 13 U/L (ref 0–44)
AST: 16 U/L (ref 15–41)
Albumin: 3.4 g/dL — ABNORMAL LOW (ref 3.5–5.0)
Alkaline Phosphatase: 143 U/L — ABNORMAL HIGH (ref 38–126)
Anion gap: 13 (ref 5–15)
BUN: 11 mg/dL (ref 8–23)
CO2: 22 mmol/L (ref 22–32)
Calcium: 9.3 mg/dL (ref 8.9–10.3)
Chloride: 90 mmol/L — ABNORMAL LOW (ref 98–111)
Creatinine, Ser: 0.67 mg/dL (ref 0.61–1.24)
GFR calc Af Amer: >60 mL/min (ref >60)
GFR calc non Af Amer: >60 mL/min (ref >60)
Glucose, Bld: 94 mg/dL (ref 70–99)
Potassium: 3.8 mmol/L (ref 3.5–5.1)
Sodium: 125 mmol/L — ABNORMAL LOW (ref 135–145)
Total Bilirubin: 0.5 mg/dL (ref 0.3–1.2)
Total Protein: 7.4 g/dL (ref 6.5–8.1)

## 2020-05-10 LAB — CBC WITH DIFFERENTIAL/PLATELET
Abs Immature Granulocytes: 0.01 10*3/uL (ref 0.00–0.07)
Basophils Absolute: 0 10*3/uL (ref 0.0–0.1)
Basophils Relative: 1 %
Eosinophils Absolute: 0.2 10*3/uL (ref 0.0–0.5)
Eosinophils Relative: 7 %
HCT: 33.5 % — ABNORMAL LOW (ref 39.0–52.0)
Hemoglobin: 10.7 g/dL — ABNORMAL LOW (ref 13.0–17.0)
Immature Granulocytes: 0 %
Lymphocytes Relative: 39 %
Lymphs Abs: 1.3 10*3/uL (ref 0.7–4.0)
MCH: 26.8 pg (ref 26.0–34.0)
MCHC: 31.9 g/dL (ref 30.0–36.0)
MCV: 84 fL (ref 80.0–100.0)
Monocytes Absolute: 0.4 10*3/uL (ref 0.1–1.0)
Monocytes Relative: 13 %
Neutro Abs: 1.3 10*3/uL — ABNORMAL LOW (ref 1.7–7.7)
Neutrophils Relative %: 40 %
Platelets: 207 10*3/uL (ref 150–400)
RBC: 3.99 MIL/uL — ABNORMAL LOW (ref 4.22–5.81)
RDW: 14.9 % (ref 11.5–15.5)
WBC: 3.3 10*3/uL — ABNORMAL LOW (ref 4.0–10.5)
nRBC: 0 % (ref 0.0–0.2)

## 2020-05-10 MED ORDER — SODIUM CHLORIDE 0.9 % IV BOLUS
1000.0000 mL | Freq: Once | INTRAVENOUS | Status: AC
  Administered 2020-05-10: 1000 mL via INTRAVENOUS

## 2020-05-10 NOTE — ED Notes (Signed)
Called for transport back to [facility]. 

## 2020-05-10 NOTE — ED Provider Notes (Signed)
Blood pressure (!) 158/84, pulse 88, temperature 97.7 F (36.5 C), temperature source Oral, resp. rate 18, height 5\' 11"  (1.803 m), weight 103.5 kg, SpO2 97 %.  Assuming care from Dr. .  In short, Brad Graham is a 59 y.o. male with a chief complaint of Blurred Vision .  Refer to the original H&P for additional details.  The current plan of care is to f/u after 1L IVF given.  04:33 PM  Patient presents to the emergency department from [facility].  The triage note is complaining of blurry vision but patient with no vision deficit or focal neurologic deficit.  Lab work shows some hyponatremia which has been noted in the past and Dr. [facility] would like to give 1L IVF prior to discharge.  I have reviewed the labs and discussed with the patient.  Plan for discharge with close PCP follow-up plan for repeat sodium in the coming week.     Estell Harpin, MD 05/10/20 [phone]

## 2020-05-10 NOTE — ED Provider Notes (Signed)
[facility] EMERGENCY DEPARTMENT Provider Note   CSN: [id number] Arrival date & time: 05/10/20  1102     History Chief Complaint  Patient presents with  . Blurred Vision    Brad Graham is a 59 y.o. male.  Patient states that he had some blurred vision earlier today but does not have it now.  Patient has history of a stroke and has weakness on the left side  The history is provided by the patient and medical records.  Weakness Severity:  Moderate Onset quality:  Sudden Progression:  Resolved Chronicity:  New Context: alcohol use   Relieved by:  Nothing Worsened by:  Nothing Associated symptoms: no abdominal pain, no chest pain, no cough, no diarrhea, no frequency, no headaches and no seizures        Past Medical History:  Diagnosis Date  . Alcohol abuse   . Alcohol abuse, continuous   . Arthritis   . Centrencephalic epilepsy (HCC)   . Diabetes mellitus without complication (HCC)   . H/O: CVA (cerebrovascular accident)   . Hemiparesis affecting left side as late effect of cerebrovascular accident (HCC)   . Hyperlipidemia   . Hypertension   . Syncope 07/02/2016  . Syncope and collapse 01/27/2020    Patient Active Problem List   Diagnosis Date Noted  . Cerebral thrombosis with cerebral infarction 04/10/2020  . Acute on chronic diastolic CHF (congestive heart failure) (HCC) 04/06/2020  . Hyponatremia 03/30/2020  . Volume overload 03/30/2020  . UTI (urinary tract infection) 03/30/2020  . DNR (do not resuscitate) 03/30/2020  . Alcohol abuse   . Centrencephalic epilepsy (HCC)   . Hemiparesis affecting left side as late effect of cerebrovascular accident   . Bilateral leg edema   . Sinus tachycardia   . Hyperkalemia 03/03/2020  . Metabolic acidosis 03/03/2020  . Acute encephalopathy 03/03/2020  . Cardiogenic shock (HCC) 03/03/2020  . Hyperglycemia 03/03/2020  . Acute renal failure (HCC)   . Goals of care, counseling/discussion   . Palliative care by  specialist   . History of Cardiopulmonary arrest  03/02/2020  . Unresponsive episode 01/28/2020  . Seizure (HCC) 08/02/2016  . Essential hypertension 05/10/2015  . Diabetes mellitus without complication (HCC) 05/10/2015  . Hyperlipidemia 05/10/2015  . Proteinuria 05/10/2015    Past Surgical History:  Procedure Laterality Date  . none         Family History  Problem Relation Age of Onset  . Diabetes Mother   . Stroke Mother   . Heart disease Maternal Grandmother   . Heart disease Maternal Grandfather     Social History   Tobacco Use  . Smoking status: Current Every Day Smoker    Packs/day: 0.50    Types: Cigarettes  . Smokeless tobacco: Never Used  Vaping Use  . Vaping Use: Never used  Substance Use Topics  . Alcohol use: Not Currently    Comment: fifth every other day  . Drug use: No    Home Medications Prior to Admission medications   Medication Sig Start Date End Date Taking? Authorizing Provider  atorvastatin (LIPITOR) 80 MG tablet Take 1 tablet (80 mg total) by mouth daily at 6 PM. 04/15/20 05/15/20  Sherryll Burger, Pratik D, DO  carvedilol (COREG) 25 MG tablet Take 1 tablet (25 mg total) by mouth 2 (two) times daily with a meal. 02/02/20   Mardene Celeste I, NP  Cholecalciferol (VITAMIN D) 50 MCG (2000 UT) CAPS Take 1 capsule by mouth every morning.    [provider]  clopidogrel (PLAVIX) 75 MG tablet Take 1 tablet (75 mg total) by mouth daily. 04/16/20 05/16/20  Sherryll BurgerShah, Pratik D, DO  folic acid (FOLVITE) 1 MG tablet Take 1 tablet (1 mg total) by mouth daily. 02/02/20   Mardene CelesteAsaro, Jessica I, NP  magnesium oxide (MAG-OX) 400 (241.3 Mg) MG tablet Take 1 tablet (400 mg total) by mouth 2 (two) times daily. 03/12/20   Enedina FinnerPatel, Sona, MD  metFORMIN (GLUCOPHAGE) 1000 MG tablet Take 1 tablet (1,000 mg total) by mouth daily with breakfast. 02/02/20 02/01/21  Mardene CelesteAsaro, Jessica I, NP  OXYGEN Inhale 2 L into the lungs daily as needed.    [provider]  pantoprazole (PROTONIX) 40 MG  tablet Take 1 tablet (40 mg total) by mouth daily. 04/16/20 05/16/20  Sherryll BurgerShah, Pratik D, DO  phenytoin (DILANTIN) 200 MG ER capsule Take 1 capsule (200 mg total) by mouth daily. 04/16/20 05/16/20  Sherryll BurgerShah, Pratik D, DO  phenytoin (DILANTIN) 300 MG ER capsule Take 1 capsule (300 mg total) by mouth at bedtime. 04/15/20 05/15/20  Sherryll BurgerShah, Pratik D, DO  senna-docusate (SENOKOT-S) 8.6-50 MG tablet Take 2 tablets by mouth at bedtime. 04/15/20   Sherryll BurgerShah, Pratik D, DO  tamsulosin (FLOMAX) 0.4 MG CAPS capsule Take 1 capsule (0.4 mg total) by mouth daily after supper. 01/28/20   Swayze, Ava, DO  thiamine 100 MG tablet Take 1 tablet (100 mg total) by mouth daily. 02/02/20   Mardene CelesteAsaro, Jessica I, NP    Allergies    Patient has no known allergies.  Review of Systems   Review of Systems  Constitutional: Negative for appetite change and fatigue.  HENT: Negative for congestion, ear discharge and sinus pressure.   Eyes: Negative for discharge.  Respiratory: Negative for cough.   Cardiovascular: Negative for chest pain.  Gastrointestinal: Negative for abdominal pain and diarrhea.  Genitourinary: Negative for frequency and hematuria.  Musculoskeletal: Negative for back pain.  Skin: Negative for rash.  Neurological: Positive for weakness. Negative for seizures and headaches.       Blurred vision  Psychiatric/Behavioral: Negative for hallucinations.    Physical Exam Updated Vital Signs BP (!) 158/84 (BP Location: Right Arm)   Pulse 88   Temp 97.7 F (36.5 C) (Oral)   Resp 18   Ht 5\' 11"  (1.803 m)   Wt 103.5 kg   SpO2 97%   BMI 31.82 kg/m   Physical Exam Vitals and nursing note reviewed.  Constitutional:      Appearance: He is well-developed.  HENT:     Head: Normocephalic.     Nose: Nose normal.  Eyes:     General: No scleral icterus.    Conjunctiva/sclera: Conjunctivae normal.  Neck:     Thyroid: No thyromegaly.  Cardiovascular:     Rate and Rhythm: Normal rate and regular rhythm.     Heart sounds: No murmur  heard.  No friction rub. No gallop.   Pulmonary:     Breath sounds: No stridor. No wheezing or rales.  Chest:     Chest wall: No tenderness.  Abdominal:     General: There is no distension.     Tenderness: There is no abdominal tenderness. There is no rebound.  Musculoskeletal:        General: Normal range of motion.     Cervical back: Neck supple.     Comments: Weakness in left arm and leg which is his normal  Lymphadenopathy:     Cervical: No cervical adenopathy.  Skin:    Findings:  No erythema or rash.  Neurological:     Mental Status: He is alert and oriented to person, place, and time.     Motor: No abnormal muscle tone.     Coordination: Coordination normal.  Psychiatric:        Behavior: Behavior normal.     ED Results / Procedures / Treatments   Labs (all labs ordered are listed, but only abnormal results are displayed) Labs Reviewed  COMPREHENSIVE METABOLIC PANEL - Abnormal; Notable for the following components:      Result Value   Sodium 125 (*)    Chloride 90 (*)    Albumin 3.4 (*)    Alkaline Phosphatase 143 (*)    All other components within normal limits  CBC WITH DIFFERENTIAL/PLATELET - Abnormal; Notable for the following components:   WBC 3.3 (*)    RBC 3.99 (*)    Hemoglobin 10.7 (*)    HCT 33.5 (*)    Neutro Abs 1.3 (*)    All other components within normal limits  CBC WITH DIFFERENTIAL/PLATELET    EKG None  Radiology CT Head Wo Contrast  Result Date: 05/10/2020 CLINICAL DATA:  Left-sided weakness, blurred vision EXAM: CT HEAD WITHOUT CONTRAST TECHNIQUE: Contiguous axial images were obtained from the base of the skull through the vertex without intravenous contrast. COMPARISON:  04/09/2020 FINDINGS: Brain: No evidence of acute infarction, hemorrhage, hydrocephalus, extra-axial collection or mass lesion/mass effect. Encephalomalacia from chronic infarct in the right external capsule and small lacunar infarct in the left centrum semiovale. Moderate  low-density changes within the periventricular and subcortical white matter, right greater than left, compatible with chronic microvascular ischemic change. Mild diffuse cerebral volume loss. Vascular: Atherosclerotic calcifications involving the large vessels of the skull base. No unexpected hyperdense vessel. Skull: Normal. Negative for fracture or focal lesion. Sinuses/Orbits: Right maxillary mucosal thickening. Remaining paranasal sinuses are clear. Orbital structures unremarkable. Other: None. IMPRESSION: 1. No acute intracranial findings. 2. Chronic microvascular ischemic change and cerebral volume loss. Electronically Signed   By: Duanne Guess D.O.   On: 05/10/2020 12:31    Procedures Procedures (including critical care time)  Medications Ordered in ED Medications  sodium chloride 0.9 % bolus 1,000 mL (has no administration in time range)    ED Course  I have reviewed the triage vital signs and the nursing notes.  Pertinent labs & imaging results that were available during my care of the patient were reviewed by me and considered in my medical decision making (see chart for details).    MDM Rules/Calculators/A&P                          Patient with hyponatremia.  Patient's blurred vision has resolved.      This patient presents to the ED for concern of blurred vision, this involves an extensive number of treatment options, and is a complaint that carries with it a high risk of complications and morbidity.  The differential diagnosis includes stroke metabolic abnormality  Lab Tests:  I Ordered, reviewed, and interpreted labs, which included CBC and chemistries that showed mild anemia and hypokalemia Medicines ordered:   I ordered medication normal saline hypokalemia  Imaging Studies ordered:   I ordered imaging studies which included CT head  I independently visualized and interpreted imaging which showed no acute disease  Additional history  obtained:   Additional history obtained from old records Previous records obtained and reviewed. Consultations Obtained:     Reevaluation:  After the interventions stated above, I reevaluated the patient and found completely improved  Critical Interventions:  .   Final Clinical Impression(s) / ED Diagnoses Final diagnoses:  None    Rx / DC Orders ED Discharge Orders    None       Bethann Berkshire, MD 05/13/20 [phone]

## 2020-05-10 NOTE — ED Triage Notes (Signed)
Patient brought in my [facility] today from [facility] facility for c/o blurry vision that started this am when he woke up. PT is alert and oriented upon arrival today in the ED. PT states he had a stroke in May 2021 and has left sided weakness since then.

## 2020-05-10 NOTE — Discharge Instructions (Addendum)
Follow up with your md later this week for recheck °

## 2020-05-17 ENCOUNTER — Ambulatory Visit (INDEPENDENT_AMBULATORY_CARE_PROVIDER_SITE_OTHER): Payer: Medicaid Other | Admitting: Adult Health

## 2020-05-17 ENCOUNTER — Encounter: Payer: Self-pay | Admitting: Adult Health

## 2020-05-17 VITALS — BP 145/92 | HR 77 | Wt 224.0 lb

## 2020-05-17 DIAGNOSIS — R569 Unspecified convulsions: Secondary | ICD-10-CM

## 2020-05-17 DIAGNOSIS — I639 Cerebral infarction, unspecified: Secondary | ICD-10-CM

## 2020-05-17 DIAGNOSIS — E785 Hyperlipidemia, unspecified: Secondary | ICD-10-CM

## 2020-05-17 DIAGNOSIS — I10 Essential (primary) hypertension: Secondary | ICD-10-CM | POA: Diagnosis not present

## 2020-05-17 DIAGNOSIS — E119 Type 2 diabetes mellitus without complications: Secondary | ICD-10-CM

## 2020-05-17 NOTE — Patient Instructions (Signed)
Your Plan:  Continue current treatment plan working with therapy  Continue current medications and ensure aggressive stroke risk factor management with BP goal<130/90, LDL goal<70 and A1c goal<7.0   Follow-up in 6 months or call earlier if needed     Thank you for coming to see [place] at [facility]. I hope we have been able to provide you high quality care today.  You may receive a patient satisfaction survey over the next few weeks. We would appreciate your feedback and comments so that we may continue to improve ourselves and the health of our patients.

## 2020-05-17 NOTE — Progress Notes (Signed)
[facility] [address] [place]. [place] [address] [phone]       HOSPITAL FOLLOW UP NOTE  Mr. Brad Graham Date of Birth:  [birth year] Medical Record Number:  [id number]   Reason for Referral:  hospital stroke follow up    SUBJECTIVE:   CHIEF COMPLAINT:  Chief Complaint  Patient presents with  . Hospitalization Follow-up    HFU after stroke. States he has been doing well since being at rehab. Almost able to stand on his own.   . treatment room    with nurse    HPI:   Brad Graham is a 59 y.o. 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  Stroke work-up revealed punctate acute cortical infarct of the left superior frontal gyrus likely secondary to small vessel disease vs embolic.  Did not recommend further cardiac work-up as he would not be a good long-term candidate for anticoagulation given history of heavy EtOH use and alcohol withdrawal seizures.  Recommended DAPT for 3 weeks and Plavix alone as previously on aspirin as well as aggressive stroke risk factor management including HTN, HLD and DM.Marland Kitchen  Increase atorvastatin dosage from 20 mg to 80 mg daily.  Other stroke risk factors include advanced age, tobacco use, prior EtOH abuse, obesity, history of stroke, family history of stroke and CHF.  Other active problems include seizure disorder, hyponatremia and anemia.  Stroke: Punctate acute cortical infarct of the  left superior frontal gyrus.  Likely small vessel disease  Resultant right-sided weakness  Code Stroke CT Head - not ordered     CT head - Atrophy and chronic ischemia. No acute abnormality and no change from the recent CT.   MRI head - Motion degraded. Punctate acute cortical infarct of the left superior frontal gyrus. Moderate chronic microvascular ischemic changes. Small chronic infarcts and microhemorrhages as described.   MRA head - No proximal intracranial vessel occlusion. Potential moderate to marked stenosis of the paraclinoid internal carotid arteries bilaterally, which could be overestimated due to artifact  CTA H&N - not ordered  CT Perfusion - not ordered  Carotid Doppler - 01/28/20 - Mild atherosclerotic disease involving the carotid bulbs. Estimated degree of stenosis in the internal carotid arteries is less than 50% bilaterally.  2D Echo - EF - 70 to 75%. No cardiac source of emboli identified.   Sars Corona Virus 2 - negative  LDL - 82  HgbA1c - 7.1  UDS - 03/30/20 - benzodiazepine  VTE prophylaxis - Lovenox  aspirin 81 mg daily prior to admission, now on aspirin 81 mg daily and clopidogrel 75 mg daily into 3 weeks followed by Plavix alone  Patient counseled to be compliant with his antithrombotic medications  Ongoing aggressive stroke risk factor management  Therapy recommendations: SNF  Disposition:  SNF  Today, 05/17/2020, Brad Graham is being seen for hospital follow-up currently residing at [facility] accompanied by facility CNA   He has been recovering well with improvement of right-sided weakness and baseline chronic left-sided weakness He continues to work with therapy ambulating short distances without difficulty Denies  new stroke/TIA symptoms  Completed 3 weeks DAPT and remains on Plavix alone without bleeding or bruising Continues on atorvastatin 80 mg daily without myalgias Blood pressure today 145/92 Glucose levels monitored by  facility  No concerns at this time      ROS:   14 system review of systems performed and negative with exception of weakness  PMH:  Past Medical History:  Diagnosis Date  . Alcohol abuse   . Alcohol abuse, continuous   . Arthritis   . Centrencephalic epilepsy (HCC)   . Diabetes mellitus without complication (HCC)   . H/O: CVA (cerebrovascular accident)   . Hemiparesis affecting left side as late effect of cerebrovascular accident (HCC)   . Hyperlipidemia   . Hypertension   . Syncope 07/02/2016  . Syncope and collapse 01/27/2020    PSH:  Past Surgical History:  Procedure Laterality Date  . none      Social History:  Social History   Socioeconomic History  . Marital status: Single    Spouse name: Not on file  . Number of children: Not on file  . Years of education: Not on file  . Highest education level: Not on file  Occupational History  . Not on file  Tobacco Use  . Smoking status: Current Every Day Smoker    Packs/day: 0.50    Types: Cigarettes  . Smokeless tobacco: Never Used  Vaping Use  . Vaping Use: Never used  Substance and Sexual Activity  . Alcohol use: Not Currently    Comment: fifth every other day  . Drug use: No  . Sexual activity: Never  Other Topics Concern  . Not on file  Social History Narrative  . Not on file   Social Determinants of Health   Financial Resource Strain:   . Difficulty of Paying Living Expenses: Not on file  Food Insecurity:   . Worried About Programme researcher, broadcasting/film/video in the Last Year: Not on file  . Ran Out of Food in the Last Year: Not on file  Transportation Needs:   . Lack of Transportation (Medical): Not on file  . Lack of Transportation (Non-Medical): Not on file  Physical Activity:   . Days of Exercise per Week: Not on file  . Minutes of Exercise per Session: Not on file  Stress:   . Feeling of Stress : Not on file  Social Connections:   . Frequency of Communication with Friends and Family: Not on file  .  Frequency of Social Gatherings with Friends and Family: Not on file  . Attends Religious Services: Not on file  . Active Member of Clubs or Organizations: Not on file  . Attends Banker Meetings: Not on file  . Marital Status: Not on file  Intimate Partner Violence:   . Fear of Current or Ex-Partner: Not on file  . Emotionally Abused: Not on file  . Physically Abused: Not on file  . Sexually Abused: Not on file    Family History:  Family History  Problem Relation Age of Onset  . Diabetes Mother   . Stroke Mother   . Heart disease Maternal Grandmother   . Heart disease Maternal Grandfather     Medications:   Current Outpatient Medications on File Prior to Visit  Medication Sig Dispense Refill  . atorvastatin (LIPITOR) 80 MG tablet Take 1 tablet (80 mg total) by mouth daily at 6 PM. 30 tablet 0  . carvedilol (COREG) 25 MG tablet Take 1 tablet (25 mg  total) by mouth 2 (two) times daily with a meal. 60 tablet 0  . Cholecalciferol (VITAMIN D) 50 MCG (2000 UT) CAPS Take 1 capsule by mouth every morning.    . clopidogrel (PLAVIX) 75 MG tablet Take 75 mg by mouth daily.    . folic acid (FOLVITE) 1 MG tablet Take 1 tablet (1 mg total) by mouth daily. 30 tablet 0  . furosemide (LASIX) 40 MG tablet Take 40 mg by mouth.    . magnesium oxide (MAG-OX) 400 (241.3 Mg) MG tablet Take 1 tablet (400 mg total) by mouth 2 (two) times daily. 60 tablet 0  . metFORMIN (GLUCOPHAGE) 1000 MG tablet Take 1 tablet (1,000 mg total) by mouth daily with breakfast. 30 tablet 0  . OXYGEN Inhale 2 L into the lungs daily as needed.    . pantoprazole (PROTONIX) 40 MG tablet Take 1 tablet (40 mg total) by mouth daily. 30 tablet 0  . senna-docusate (SENOKOT-S) 8.6-50 MG tablet Take 2 tablets by mouth at bedtime. 30 tablet 0  . tamsulosin (FLOMAX) 0.4 MG CAPS capsule Take 1 capsule (0.4 mg total) by mouth daily after supper. 30 capsule 0  . thiamine 100 MG tablet Take 1 tablet (100 mg total) by mouth daily.  30 tablet 0  . phenytoin (DILANTIN) 200 MG ER capsule Take 1 capsule (200 mg total) by mouth daily. 30 capsule 0  . phenytoin (DILANTIN) 300 MG ER capsule Take 1 capsule (300 mg total) by mouth at bedtime. 30 capsule 0   No current facility-administered medications on file prior to visit.    Allergies:  No Known Allergies    OBJECTIVE:  Physical Exam  Vitals:   05/17/20 1321  BP: (!) 145/92  Pulse: 77  Weight: 224 lb (101.6 kg)   Body mass index is 31.24 kg/m. No exam data present   General: well developed, well nourished,  pleasant middle-aged African-American male, seated, in no evident distress Head: head normocephalic and atraumatic.   Neck: supple with no carotid or supraclavicular bruits Cardiovascular: regular rate and rhythm, no murmurs Musculoskeletal: no deformity Skin:  no rash/petichiae Vascular:  Normal pulses all extremities   Neurologic Exam Mental Status: Awake and fully alert.   Fluent speech and language.  Oriented to place and time. Recent and remote memory intact. Attention span, concentration and fund of knowledge appropriate. Mood and affect appropriate.  Cranial Nerves: Fundoscopic exam reveals sharp disc margins. Pupils equal, briskly reactive to light. Extraocular movements full without nystagmus. Visual fields full to confrontation. Hearing intact. Facial sensation intact.  Chronic mild left lower facial weakness.  Tongue, and palate moves normally and symmetrically.  Motor: Normal bulk and tone. Normal strength in all tested extremity muscles except chronic mild L hemiparesis. Sensory.: intact to touch , pinprick , position and vibratory sensation.  Coordination: Rapid alternating movements normal in all extremities except slightly decreased left hand. Finger-to-nose and heel-to-shin performed accurately on right side. Gait and Station: Deferred as assistive device not present Reflexes: 1+ and symmetric. Toes downgoing.     NIHSS  0 Modified  Rankin  1     ASSESSMENT: Brad Graham is a 59 y.o. year old male presented on 03/30/2020 with AMS secondary to severe hyponatremia with Na of 109 later developing right-sided weakness with stroke work-up revealing punctate acute cortical infarct of the left superior frontal gyrus likely secondary to small vessel disease but unable to rule out cardioembolic source.  Not a anticoagulation candidate therefore no further cardioembolic work-up recommended.  Vascular risk factors include R BG hemorrhage with residual left hemiparesis, HTN, HLD, DM, CHF, chronic EtOH use, tobacco use, centrencephalic epilepsy and [facility]9 for cardiac arrest requiring intubation dx with ARF, aspiration PNA and EtOH withdrawal.      PLAN:  1. L frontal stroke :  a. Recovered well without residual right-sided deficits.  Chronic left-sided deficits at baseline.   b. Continue clopidogrel 75 mg daily  and atorvastatin for secondary stroke prevention. Close PCP follow up for aggressive stroke risk factor management  2. HTN: BP goal <130/90.  Slightly elevated today but stable at facility.  Managed by facility. 3. HLD: LDL goal <70. Recent LDL 82.  Continue atorvastatin 80 mg daily.  Monitored and managed by facility 4. DMII: A1c goal<7.0. Recent A1c 7.1.  Managed by facility 5. Tobacco and EtOH use: Complete cessation as he is currently residing at facility 6. Epilepsy: Denies recent seizure activity or symptoms.  Continues on Dilantin with recent level satisfactory managed by facility.    Follow up in 6 months or call earlier if needed   I spent 45 minutes of face-to-face and non-face-to-face time with patient and facility CNA.  This included previsit chart review, lab review, study review, order entry, electronic health record documentation, patient education regarding recent stroke with etiology, residual deficits, importance of managing stroke risk factors and answered all questions to patient  satisfaction     Brad Graham, [facility] Amb[facility] Surgery CenterGNP-BC  [facility] [address] [place], [place] [id number]  Phone [phone] Fax [phone] Note: This document was prepared with digital dictation and possible smart phrase technology. Any transcriptional errors that result from this process are unintentional.

## 2020-05-24 NOTE — Progress Notes (Signed)
I agree with the above plan 

## 2020-07-12 ENCOUNTER — Other Ambulatory Visit: Payer: Self-pay

## 2020-07-12 ENCOUNTER — Emergency Department (HOSPITAL_COMMUNITY)
Admission: EM | Admit: 2020-07-12 | Discharge: 2020-07-13 | Disposition: A | Discharge status: Home / Self Care | Payer: Medicaid Other | Attending: Emergency Medicine | Admitting: Emergency Medicine

## 2020-07-12 ENCOUNTER — Encounter (HOSPITAL_COMMUNITY): Payer: Self-pay | Admitting: *Deleted

## 2020-07-12 DIAGNOSIS — Z79899 Other long term (current) drug therapy: Secondary | ICD-10-CM | POA: Diagnosis not present

## 2020-07-12 DIAGNOSIS — F10959 Alcohol use, unspecified with alcohol-induced psychotic disorder, unspecified: Secondary | ICD-10-CM | POA: Insufficient documentation

## 2020-07-12 DIAGNOSIS — I110 Hypertensive heart disease with heart failure: Secondary | ICD-10-CM | POA: Diagnosis not present

## 2020-07-12 DIAGNOSIS — F17210 Nicotine dependence, cigarettes, uncomplicated: Secondary | ICD-10-CM | POA: Diagnosis not present

## 2020-07-12 DIAGNOSIS — R451 Restlessness and agitation: Secondary | ICD-10-CM

## 2020-07-12 DIAGNOSIS — E119 Type 2 diabetes mellitus without complications: Secondary | ICD-10-CM | POA: Diagnosis not present

## 2020-07-12 DIAGNOSIS — I5033 Acute on chronic diastolic (congestive) heart failure: Secondary | ICD-10-CM | POA: Insufficient documentation

## 2020-07-12 DIAGNOSIS — Z7984 Long term (current) use of oral hypoglycemic drugs: Secondary | ICD-10-CM | POA: Insufficient documentation

## 2020-07-12 LAB — COMPREHENSIVE METABOLIC PANEL
ALT: 23 U/L (ref 0–44)
AST: 25 U/L (ref 15–41)
Albumin: 4.3 g/dL (ref 3.5–5.0)
Alkaline Phosphatase: 151 U/L — ABNORMAL HIGH (ref 38–126)
Anion gap: 9 (ref 5–15)
BUN: 20 mg/dL (ref 8–23)
CO2: 28 mmol/L (ref 22–32)
Calcium: 9.6 mg/dL (ref 8.9–10.3)
Chloride: 94 mmol/L — ABNORMAL LOW (ref 98–111)
Creatinine, Ser: 0.95 mg/dL (ref 0.61–1.24)
GFR, Estimated: >60 mL/min (ref >60)
Glucose, Bld: 113 mg/dL — ABNORMAL HIGH (ref 70–99)
Potassium: 4.2 mmol/L (ref 3.5–5.1)
Sodium: 131 mmol/L — ABNORMAL LOW (ref 135–145)
Total Bilirubin: 0.4 mg/dL (ref 0.3–1.2)
Total Protein: 8.4 g/dL — ABNORMAL HIGH (ref 6.5–8.1)

## 2020-07-12 LAB — CBC
HCT: 39.1 % (ref 39.0–52.0)
Hemoglobin: 12 g/dL — ABNORMAL LOW (ref 13.0–17.0)
MCH: 25.4 pg — ABNORMAL LOW (ref 26.0–34.0)
MCHC: 30.7 g/dL (ref 30.0–36.0)
MCV: 82.7 fL (ref 80.0–100.0)
Platelets: 215 10*3/uL (ref 150–400)
RBC: 4.73 MIL/uL (ref 4.22–5.81)
RDW: 14.7 % (ref 11.5–15.5)
WBC: 3.3 10*3/uL — ABNORMAL LOW (ref 4.0–10.5)
nRBC: 0 % (ref 0.0–0.2)

## 2020-07-12 LAB — ETHANOL: Alcohol, Ethyl (B): <10 mg/dL (ref <10)

## 2020-07-12 LAB — SALICYLATE LEVEL: Salicylate Lvl: <7 mg/dL — ABNORMAL LOW (ref 7.0–30.0)

## 2020-07-12 LAB — ACETAMINOPHEN LEVEL: Acetaminophen (Tylenol), Serum: <10 ug/mL — ABNORMAL LOW (ref 10–30)

## 2020-07-12 MED ORDER — LORAZEPAM 1 MG PO TABS
1.0000 mg | ORAL_TABLET | Freq: Once | ORAL | Status: AC
  Administered 2020-07-12: 1 mg via ORAL
  Filled 2020-07-12: qty 1

## 2020-07-12 NOTE — ED Provider Notes (Signed)
[facility] EMERGENCY DEPARTMENT Provider Note   CSN: [id number] Arrival date & time: 07/12/20  1497     History No chief complaint on file.   Brad Graham is a 59 y.o. male.  HPI      Brad Graham is a 59 y.o. male with past medical history of type 2 diabetes, left-sided weakness secondary to CVA, epilepsy, hypertension, and hyperlipidemia, who presents to the Emergency Department under involuntary commitment from [facility] skilled nursing facility.  Patient was placed under involuntary commitment at the nursing home for acting agressively and verbally threatening staff.  Patient states that he became upset Sunday when he noticed the smell of feces in his room.  He states that he told staff members that he was "going to get a stick and make some of these people in here act right."  Patient now admits that he was upset at the time and that he spoke with the supervisor at the facility and everything has been worked out and he is no longer upset or wanting to harm himself or others.     Past Medical History:  Diagnosis Date  . Alcohol abuse   . Alcohol abuse, continuous   . Arthritis   . Centrencephalic epilepsy (HCC)   . Diabetes mellitus without complication (HCC)   . H/O: CVA (cerebrovascular accident)   . Hemiparesis affecting left side as late effect of cerebrovascular accident (HCC)   . Hyperlipidemia   . Hypertension   . Syncope 07/02/2016  . Syncope and collapse 01/27/2020    Patient Active Problem List   Diagnosis Date Noted  . Cerebral thrombosis with cerebral infarction 04/10/2020  . Acute on chronic diastolic CHF (congestive heart failure) (HCC) 04/06/2020  . Hyponatremia 03/30/2020  . Volume overload 03/30/2020  . UTI (urinary tract infection) 03/30/2020  . DNR (do not resuscitate) 03/30/2020  . Alcohol abuse   . Centrencephalic epilepsy (HCC)   . Hemiparesis affecting left side as late effect of cerebrovascular accident   . Bilateral leg edema   .  Sinus tachycardia   . Hyperkalemia 03/03/2020  . Metabolic acidosis 03/03/2020  . Acute encephalopathy 03/03/2020  . Cardiogenic shock (HCC) 03/03/2020  . Hyperglycemia 03/03/2020  . Acute renal failure (HCC)   . Goals of care, counseling/discussion   . Palliative care by specialist   . History of Cardiopulmonary arrest  03/02/2020  . Unresponsive episode 01/28/2020  . Seizure (HCC) 08/02/2016  . Essential hypertension 05/10/2015  . Diabetes mellitus without complication (HCC) 05/10/2015  . Hyperlipidemia 05/10/2015  . Proteinuria 05/10/2015    Past Surgical History:  Procedure Laterality Date  . none         Family History  Problem Relation Age of Onset  . Diabetes Mother   . Stroke Mother   . Heart disease Maternal Grandmother   . Heart disease Maternal Grandfather     Social History   Tobacco Use  . Smoking status: Current Every Day Smoker    Packs/day: 0.50    Types: Cigarettes  . Smokeless tobacco: Never Used  Vaping Use  . Vaping Use: Never used  Substance Use Topics  . Alcohol use: Not Currently    Comment: fifth every other day  . Drug use: No    Home Medications Prior to Admission medications   Medication Sig Start Date End Date Taking? Authorizing Provider  atorvastatin (LIPITOR) 80 MG tablet Take 1 tablet (80 mg total) by mouth daily at 6 PM. 04/15/20 07/12/20 Yes Sherryll Burger, Pratik  D, DO  carvedilol (COREG) 25 MG tablet Take 1 tablet (25 mg total) by mouth 2 (two) times daily with a meal. 02/02/20  Yes Valentino Nose, NP  Cholecalciferol (VITAMIN D) 50 MCG (2000 UT) CAPS Take 1 capsule by mouth every morning.   Yes [provider]  clopidogrel (PLAVIX) 75 MG tablet Take 75 mg by mouth daily.   Yes [provider]  folic acid (FOLVITE) 1 MG tablet Take 1 tablet (1 mg total) by mouth daily. 02/02/20  Yes Cathlean Marseilles A, NP  furosemide (LASIX) 40 MG tablet Take 40 mg by mouth.   Yes [provider]  magnesium oxide (MAG-OX)  400 (241.3 Mg) MG tablet Take 1 tablet (400 mg total) by mouth 2 (two) times daily. 03/12/20  Yes Enedina Finner, MD  metFORMIN (GLUCOPHAGE) 1000 MG tablet Take 1 tablet (1,000 mg total) by mouth daily with breakfast. 02/02/20 02/01/21 Yes Cathlean Marseilles A, NP  OXYGEN Inhale 2 L into the lungs daily as needed.   Yes [provider]  pantoprazole (PROTONIX) 40 MG tablet Take 1 tablet (40 mg total) by mouth daily. 04/16/20 07/12/20 Yes Shah, Pratik D, DO  phenytoin (DILANTIN) 200 MG ER capsule Take 1 capsule (200 mg total) by mouth daily. 04/16/20 07/12/20 Yes Shah, Pratik D, DO  senna-docusate (SENOKOT-S) 8.6-50 MG tablet Take 2 tablets by mouth at bedtime. 04/15/20  Yes Sherryll Burger, Pratik D, DO  Skin Protectants, Misc. (MINERIN CREME EX) Apply 1 application topically every 12 (twelve) hours as needed.   Yes [provider]  sodium chloride 1 g tablet Take 1 g by mouth 2 (two) times daily with a meal.   Yes [provider]  tamsulosin (FLOMAX) 0.4 MG CAPS capsule Take 1 capsule (0.4 mg total) by mouth daily after supper. 01/28/20  Yes Swayze, Ava, DO  thiamine 100 MG tablet Take 1 tablet (100 mg total) by mouth daily. 02/02/20  Yes Valentino Nose, NP  phenytoin (DILANTIN) 300 MG ER capsule Take 1 capsule (300 mg total) by mouth at bedtime. Patient not taking: Reported on 07/12/2020 04/15/20 05/15/20  Maurilio Lovely D, DO    Allergies    Patient has no known allergies.  Review of Systems   Review of Systems  Review of Systems  Constitutional: Negative for appetite change and fever.  HENT: Negative for sore throat.   Respiratory: Negative for chest tightness and shortness of breath.   Cardiovascular: Negative for chest pain.  Gastrointestinal: Negative for abdominal pain, diarrhea, nausea and vomiting.  Musculoskeletal: Negative for arthralgias and back pain.  Skin: Negative for rash and wound.  Neurological: Negative for dizziness, seizures, weakness and headaches.    Psychiatric/Behavioral: Negative for  Hallucinations, homicidal and suicidal ideas. Negative for confusion.    Physical Exam Updated Vital Signs BP (!) 161/96 (BP Location: Right Arm)   Pulse 90   Temp 98.7 F (37.1 C) (Oral)   Resp 20   SpO2 100%   Physical Exam  Physical Exam Vitals and nursing note reviewed.  Constitutional:      General: He is not in acute distress. HENT:     Head: Atraumatic.     Mouth/Throat:     Mouth: Mucous membranes are moist.  Eyes:     Conjunctiva/sclera: Conjunctivae normal.     Pupils: Pupils are equal, round, and reactive to light.  Cardiovascular:     Rate and Rhythm: Regular rate and rhythm.    Pulses: Normal pulses.  Pulmonary:  Effort: Pulmonary effort is normal.     Breath sounds: Normal breath sounds.  Abdominal:     General: There is no distension.     Palpations: Abdomen is soft.     Tenderness: There is no abdominal tenderness.  Musculoskeletal:        General: Normal range of motion.     Cervical back: Normal range of motion. No rigidity.     Right lower leg: 1+ edema.     Left lower leg: 1+ edema.  Lymphadenopathy:     Cervical: No cervical adenopathy.  Skin:    General: Skin is warm.     Findings: No rash.  Neurological:     General: No focal deficit present.     Mental Status: He is alert.     Sensory: No sensory deficit.     Motor: No weakness.  Psychiatric:        Mood and Affect: Mood is normal      Speech: Speech is normal.        Behavior: Behavior is cooperative.        Thought Content: Thought content is negative for homicidal or suicidal ideations .             Medications Ordered in ED Medications  LORazepam (ATIVAN) tablet 1 mg (1 mg Oral Given 07/12/20 2311)    ED Course  I have reviewed the triage vital signs and the nursing notes.  Pertinent labs & imaging results that were available during my care of the patient were reviewed by me and considered in my medical decision making (see chart  for details).  .        ED Results / Procedures / Treatments   Labs (all labs ordered are listed, but only abnormal results are displayed) Labs Reviewed  COMPREHENSIVE METABOLIC PANEL - Abnormal; Notable for the following components:      Result Value   Sodium 131 (*)    Chloride 94 (*)    Glucose, Bld 113 (*)    Total Protein 8.4 (*)    Alkaline Phosphatase 151 (*)    All other components within normal limits  SALICYLATE LEVEL - Abnormal; Notable for the following components:   Salicylate Lvl <7.0 (*)    All other components within normal limits  ACETAMINOPHEN LEVEL - Abnormal; Notable for the following components:   Acetaminophen (Tylenol), Serum <10 (*)    All other components within normal limits  CBC - Abnormal; Notable for the following components:   WBC 3.3 (*)    Hemoglobin 12.0 (*)    MCH 25.4 (*)    All other components within normal limits  ETHANOL  RAPID URINE DRUG SCREEN, HOSP PERFORMED  URINALYSIS, ROUTINE W REFLEX MICROSCOPIC    EKG None  Radiology No results found.  Procedures Procedures (including critical care time)    ED Course  I have reviewed the triage vital signs and the nursing notes.  Pertinent labs & imaging results that were available during my care of the patient were reviewed by me and considered in my medical decision making (see chart for details).    MDM Rules/Calculators/A&P                         Patient here from [facility] under involuntary commitment for potential harm to others.  Filed by SNF staff. Patient appears agitated but denies SI or HI thought or plan.  No reported hallucinations.  He is cooperative  on my exam.  Labs show leukopenia but this appears to be baseline.  No significant electrolyte abnormality.  He is medically cleared.  Will consult TTS.  TTS consulted and performed evaluation.  recommend patient be observed here overnight for safety and stability and behavioral health to obtain collateral information  from nursing home staff  tomorrow and reassess patient in the morning .    Final Clinical Impression(s) / ED Diagnoses Final diagnoses:  None    Rx / DC Orders ED Discharge Orders    None       Pauline Ausriplett, Greig Altergott, PA-C 07/12/20 2358    Long, Arlyss RepressJoshua G, MD 07/14/20 1940

## 2020-07-12 NOTE — ED Triage Notes (Signed)
Pt denies any SI/HI, pt states he just got a little upset.
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
## 2020-07-13 NOTE — ED Notes (Signed)
Pt taken to private room for [facility] tele assessment.

## 2020-07-13 NOTE — Progress Notes (Signed)
Pt has been psychiatrically cleared. Outpatient resources placed in pt's AVC.   Brad Graham, MSW, LCSW, LCAS Clinical Social Worker II Disposition CSW [phone]
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
## 2020-07-13 NOTE — Clinical Social Work Note (Signed)
Transition of Care [facility]) - Emergency Department Mini Assessment  Patient Details  Name: Brad Graham MRN: [id number] Date of Birth: [birth year]  Transition of Care [facility]) CM/SW Contact:    Ewing Schlein, LCSW Phone Number: 07/13/2020, 1:44 PM  Clinical Narrative: Patient is a 59 year old male who presented to the ED under IVC. Per [facility], patient is now psych cleared. CSW called Melissa in admissions at [facility]. Per Efraim Kaufmann, patient can return but will need transportation back as [facility] does not have a transportation [place] available at this time. RN updated. TOC signing off.  ED Mini Assessment: What brought you to the Emergency Department? : IVC Barriers to Discharge: ED Psych evaluation, ED Barriers Resolved Barrier interventions: [facility] has psych cleared patient Means of departure: Ambulance Interventions which prevented an admission or readmission: BH return to placement after psych assessment  Patient Contact and Communications Key Contact 1: [facility] admissions [place] with: Steva Ready Date: 07/13/20 Contact time: 1250 Call outcome: [facility] to accept patient back Choice offered to / list presented to : NA  Admission diagnosis:  IVC  Patient Active Problem List   Diagnosis Date Noted  . Cerebral thrombosis with cerebral infarction 04/10/2020  . Acute on chronic diastolic CHF (congestive heart failure) (HCC) 04/06/2020  . Hyponatremia 03/30/2020  . Volume overload 03/30/2020  . UTI (urinary tract infection) 03/30/2020  . DNR (do not resuscitate) 03/30/2020  . Alcohol abuse   . Centrencephalic epilepsy (HCC)   . Hemiparesis affecting left side as late effect of cerebrovascular accident   . Bilateral leg edema   . Sinus tachycardia   . Hyperkalemia 03/03/2020  . Metabolic acidosis 03/03/2020  . Acute encephalopathy 03/03/2020  . Cardiogenic shock (HCC) 03/03/2020  . Hyperglycemia 03/03/2020  . Acute renal failure (HCC)   . Goals of care, counseling/discussion    . Palliative care by specialist   . History of Cardiopulmonary arrest  03/02/2020  . Unresponsive episode 01/28/2020  . Seizure (HCC) 08/02/2016  . Essential hypertension 05/10/2015  . Diabetes mellitus without complication (HCC) 05/10/2015  . Hyperlipidemia 05/10/2015  . Proteinuria 05/10/2015   PCP:  Valentino Nose, NP Pharmacy:   [facility] - [place], [place] - [address] [place] [address] Phone: [phone] Fax: [phone]
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
## 2020-07-13 NOTE — ED Provider Notes (Signed)
Emergency Medicine Observation Re-evaluation Note  Qunicy A Loflin is a 59 y.o. male, seen on rounds today.  Pt initially presented to the ED for complaints of No chief complaint on file. Currently, the patient is calm and resting.  Physical Exam  BP (!) 146/88   Pulse 88   Temp 98 F (36.7 C) (Oral)   Resp 16   SpO2 96%  Physical Exam General: No acute distress Cardiac: Regular rate and rhythm Lungs: Clear to auscultation Psych: Cooperative  ED Course / MDM  EKG:    I have reviewed the labs performed to date as well as medications administered while in observation.  Recent changes in the last 24 hours include psychiatric evaluation.  Plan  Current plan is for patient to be returned back to [facility] per discussion with behavioral health.  They are going to reach out to [facility] and make sure he can return.. Patient is under full IVC at this time.  This will need to be rescinded.   Terrilee Files, MD 07/13/20 [phone]

## 2020-07-13 NOTE — Discharge Instructions (Signed)
Please follow up with one of the following outpatient providers:  [facility] * (Intensive outpatient, partial hospitalization, individual therapy, and medication management) [address] [place], [place] [address] [phone] [facility] * (medication management and therapy) [address] [place], [place] [address] [phone] [facility] (Therapy only)* (takes Medicaid and most major insurances) [place]:  [address] [place], [place]  Phone: [phone] [place]:  [address], [place], [place] [address]   Phone: [phone] [place]:  [address], [place], [place] [address]  Phone: [phone] [facility]  (specializes in trauma therapy, substance abuse and mood disorders. Takes patients without insurance for little to no cost out of pocket. Also takes most major insurances) [address],  [place], [place] [address] [phone] [facility] * (therapy and medication management) [address] [place], [place] [address] [phone] [facility] (Medication Management) [address] [place], [place] [address] [phone] [facility] * (therapy only) [address] [place], [place] [address] [phone] [facility] [address] [place] [address] [phone] [facility] * [address] [place], [place] [address] [phone] [facility]* (accepts non-insured)  [address] [place], [place] [address] [phone] Walk in Monday-Friday 8am-3pm [facility]* (takes sliding scale and Medicaid as well as other major insurances)  [place]:   [address], [place]   Phone: [phone] [place]:   [facility] [address], [place], [place]  Phone: [phone] Walk in Monday-Friday  830am-230pm [facility]* (accepts [place] non-insured) [address] [place],  [address] [phone] Walk in Monday-Friday 8am-5pm 

## 2020-07-18 ENCOUNTER — Other Ambulatory Visit: Payer: Self-pay

## 2020-07-18 NOTE — Patient Outreach (Signed)
Care Coordination  07/18/2020  Brad Graham 1960-10-02 [id number]  Phone call placed to [facility] and message left for Child psychotherapist for return call regarding status/progress of patient.    Kathi Der RN, BSN    [facility] - Managed Medicaid High Risk [phone]

## 2020-07-26 ENCOUNTER — Ambulatory Visit: Payer: Self-pay

## 2020-07-28 ENCOUNTER — Other Ambulatory Visit: Payer: Self-pay

## 2020-07-28 NOTE — Patient Outreach (Signed)
Care Coordination  07/28/2020  Brad Graham June 22, 1961 [id number]  A second unsuccessful attempt was made today to try and reach patient at home number and at [facility]-was unable to leave message at either number.  RNCM will try again within 7 days.  Kathi Der RN, BSN [place]  [facility] - Managed Medicaid High Risk [phone].

## 2020-07-28 NOTE — Patient Instructions (Signed)
   Mr. Brad Graham  - as a part of your Medicaid benefit, you are eligible for care management and care coordination services at no cost or copay. I was unable to reach you by phone today but would be happy to help you with your health related needs. Please feel free to call me at [phone].  A member of the Managed Medicaid care management team will reach out to you again over the next 7 days.   Kathi Der RN, BSN [place]  [facility] - Managed Medicaid High Risk [phone].

## 2020-08-04 ENCOUNTER — Other Ambulatory Visit: Payer: Self-pay

## 2020-08-04 NOTE — Patient Instructions (Signed)
   Mr. Brad Graham  - as a part of your Medicaid benefit, you are eligible for care management and care coordination services at no cost or copay. I was unable to reach you by phone today but would be happy to help you with your health related needs. Please feel free to call me at [phone].Marland Kitchen    Kathi Der RN, BSN [place]  [facility] - Managed Medicaid High Risk [phone]

## 2020-08-04 NOTE — Patient Outreach (Signed)
Care Coordination  08/04/2020  Brad Graham [birth year] [id number]  Third unsuccessful telephone outreach was attempted today. The patient was referred to the case management team for assistance with care management and care coordination. The patient's primary care provider has been notified of our unsuccessful attempts to make or maintain contact with the patient. The care management team is pleased to engage with this patient at any time in the future should he/she be interested in assistance from the care management team.   RNCM has attempted to contact Brad Graham with no return call.  Follow Up Plan: The Managed Medicaid care management team is available to follow up with the patient after provider conversation with the patient regarding recommendation for care management engagement and subsequent re-referral to the care management team.      Brad Der RN, BSN [place]   [facility] Care Management Coordinator - Managed [facility] [phone].

## 2020-11-17 ENCOUNTER — Encounter: Payer: Self-pay | Admitting: Adult Health

## 2020-11-17 ENCOUNTER — Ambulatory Visit: Payer: Medicaid Other | Admitting: Adult Health

## 2020-12-08 ENCOUNTER — Telehealth: Payer: Self-pay | Admitting: *Deleted

## 2020-12-08 NOTE — Telephone Encounter (Signed)
Pt. Was called and phone number out of service

## 2021-09-24 IMAGING — CT CT CERVICAL SPINE W/O CM
4 of 8 series · 14 of 33 positions shown, 15 images · non-contrast
Comparison: Cervical spine CT 08/02/2016

CLINICAL DATA: Neck trauma, uncomplicated (NEXUS/CCR neg) (Age
16-64y)

Patient found down, unwitnessed fall.
EXAM:
CT CERVICAL SPINE WITHOUT CONTRAST
TECHNIQUE: Multidetector CT imaging of the cervical spine was performed without
intravenous contrast. Multiplanar CT image reconstructions were also
generated.

[Series 4: c spine soft · axial · 0.32mm/px · z∈[+94,+186]mm · 3 of 93 slices shown]
[im 24/93  soft-tissue]
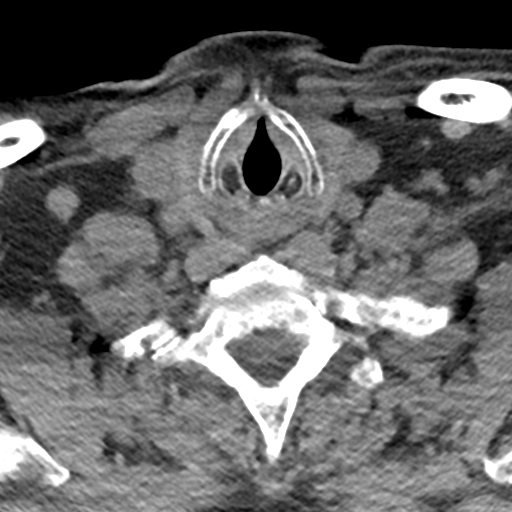
[im 47/93  soft-tissue]
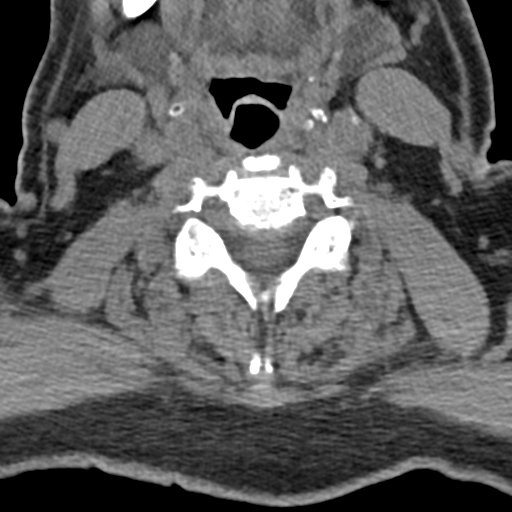
[im 70/93  soft-tissue]
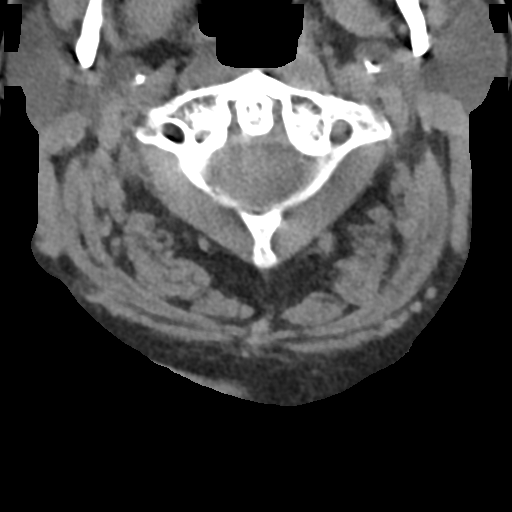

[Series 5: coronal bone · coronal · 0.26mm/px · 3 of 63 slices shown]
[im 11/63  bone]
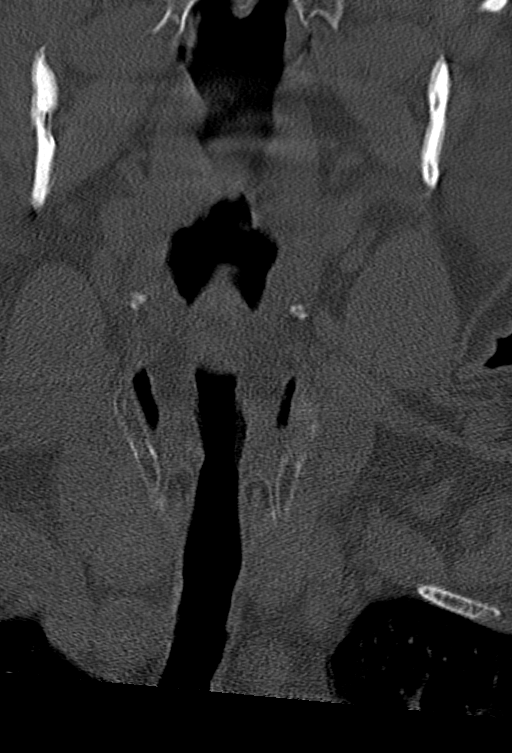
[im 35/63  bone]
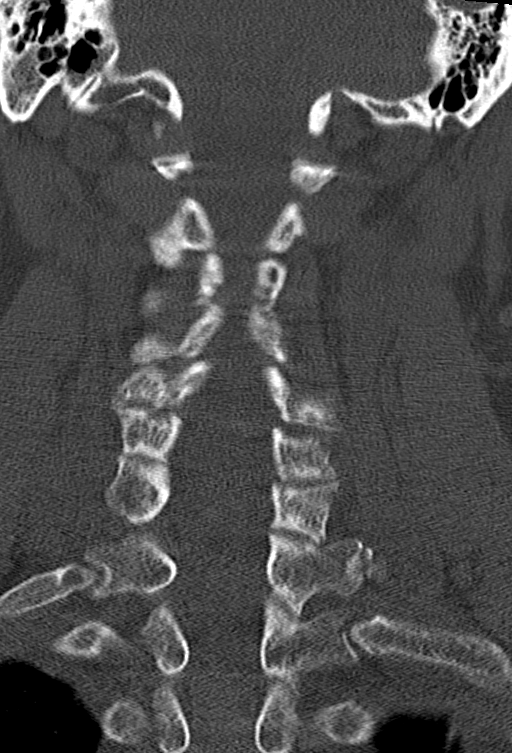
[im 60/63  bone]
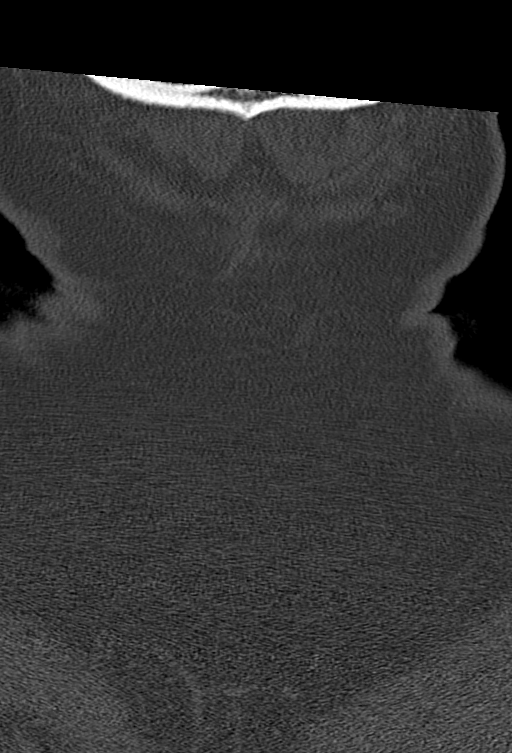

[Series 6: orthogonal bone · axial · 0.24mm/px · z∈[+69,+158]mm · 3 of 105 slices shown, 4 images]
[im 27/105  soft-tissue]
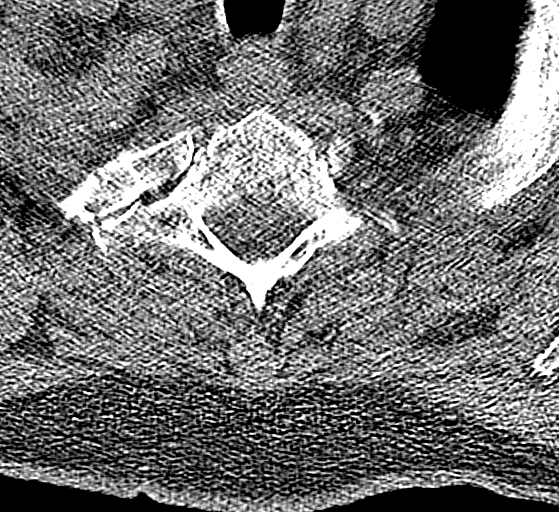
[im 27/105  bone]
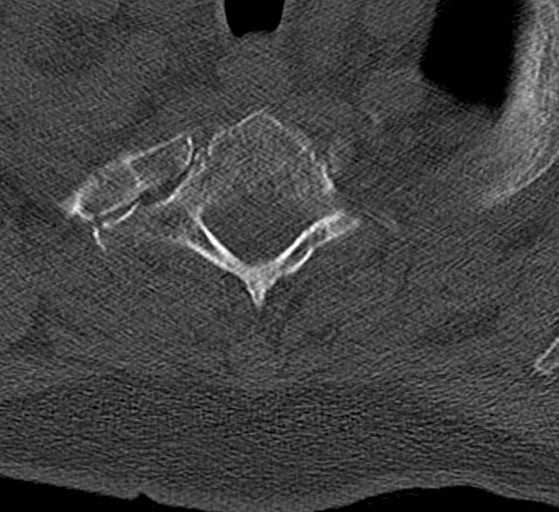
[im 53/105  bone]
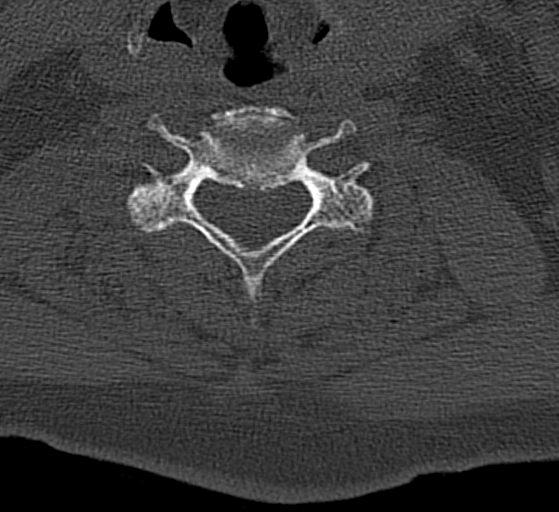
[im 79/105  bone]
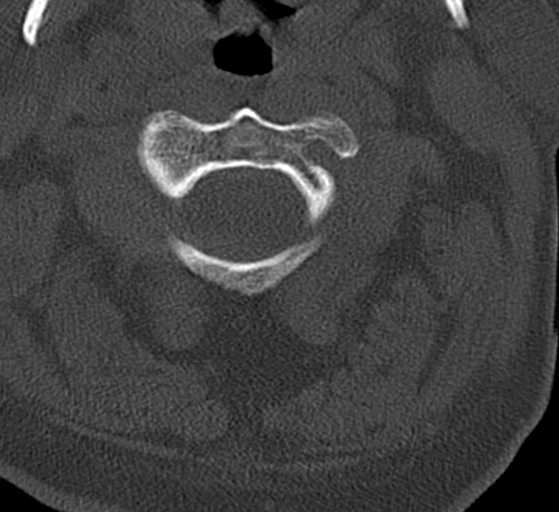

[Series 9: sagittal bone · sagittal · 0.27mm/px · 5 of 52 slices shown]
[im 9/52  bone]
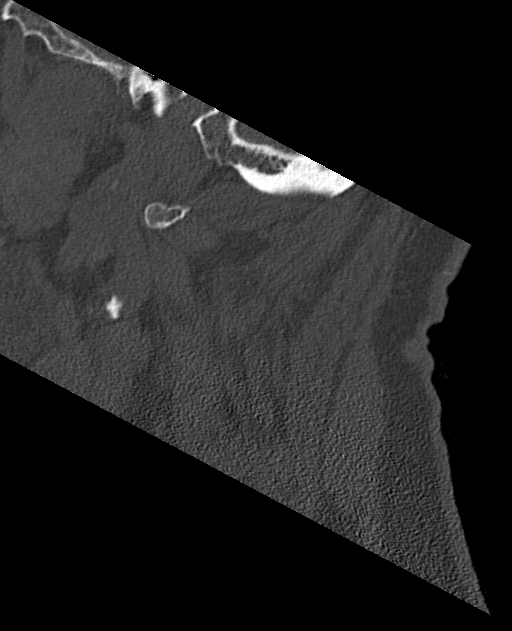
[im 18/52  bone]
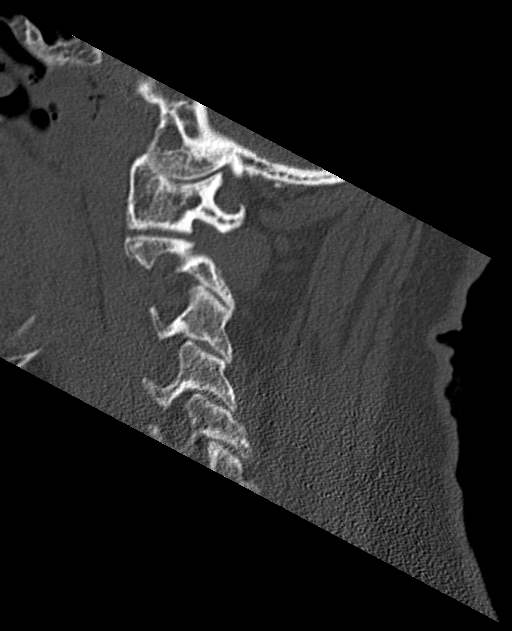
[im 26/52  bone]
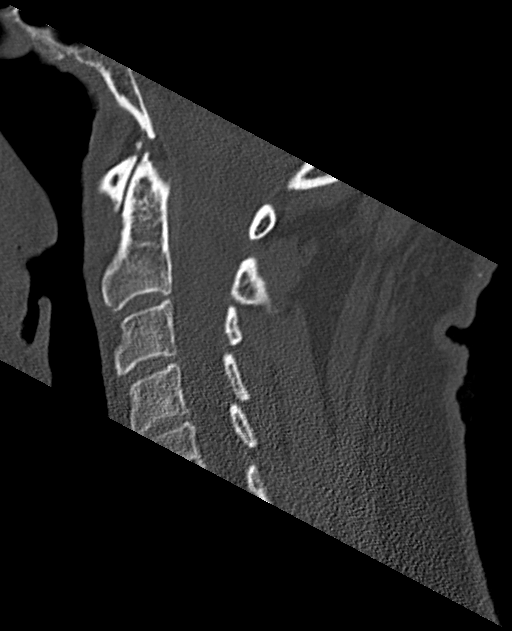
[im 35/52  bone]
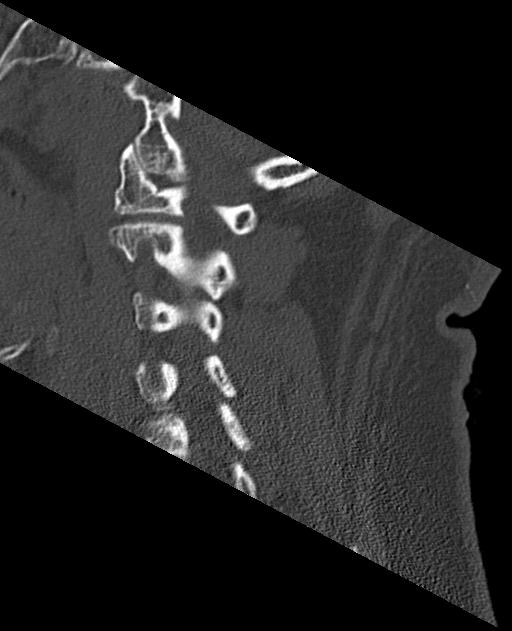
[im 43/52  bone]
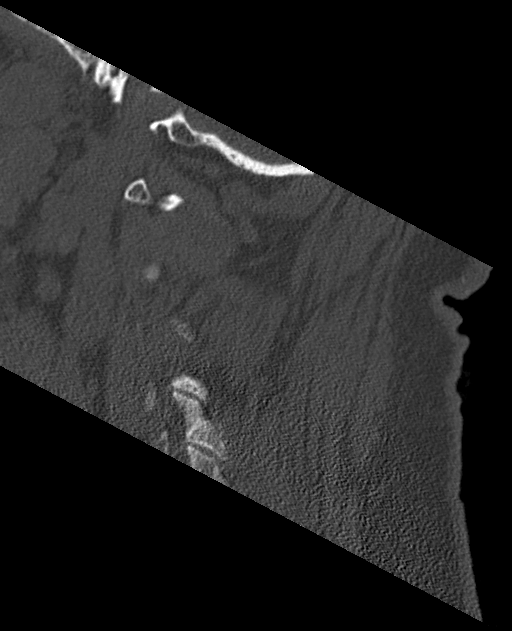

[14 of 33 positions shown; findings below may reference images not displayed]

FINDINGS: Alignment: Straightening of normal lordosis, unchanged from prior
exam. No traumatic subluxation.

Skull base and vertebrae: No acute fracture. Vertebral body heights
are maintained. The dens and skull base are intact.

Soft tissues and spinal canal: No prevertebral fluid or swelling. No
visible canal hematoma.

Disc levels: Degenerative disc disease at C5-C6 and C6-C7, similar
to prior. There is scattered facet hypertrophy.

Upper chest: No acute findings.

Other: None.
IMPRESSION: Degenerative change in the cervical spine without acute fracture or
subluxation.

## 2021-09-25 IMAGING — US US RENAL
1 series · 14 of 25 positions shown · non-contrast
Comparison: None.

CLINICAL DATA: Acute renal insufficiency.

EXAM:
RENAL / URINARY TRACT ULTRASOUND COMPLETE

[Series 1: us renal · 14 of 54 slices shown]
[im 1/54]
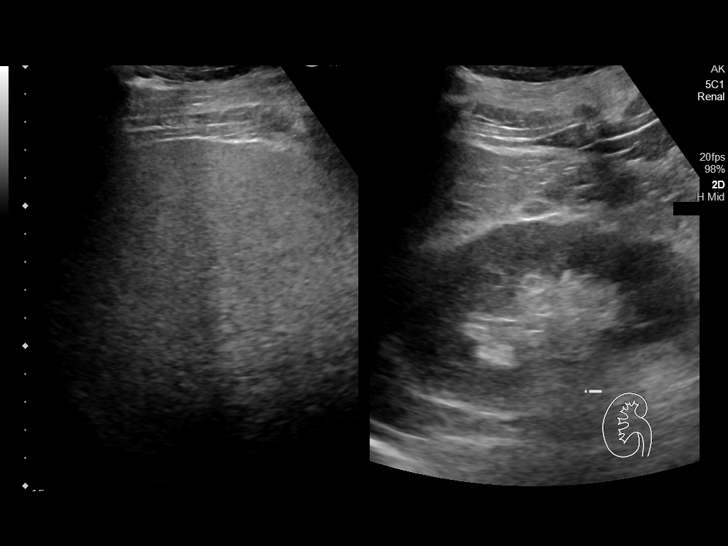
[im 5/54]
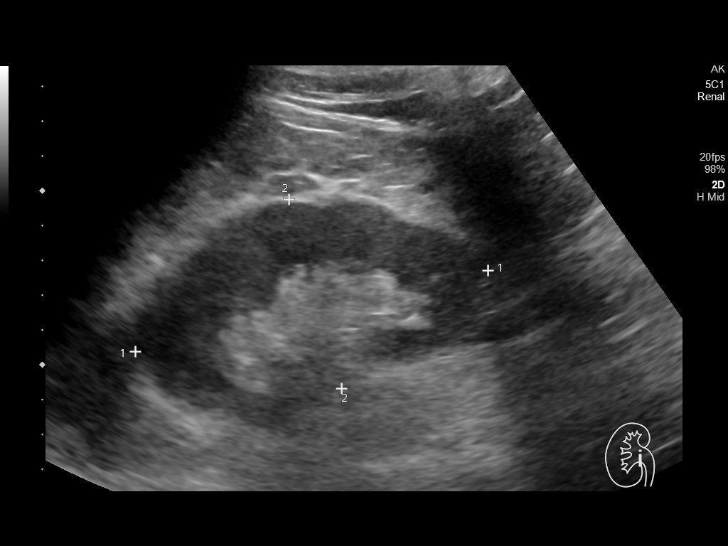
[im 9/54]
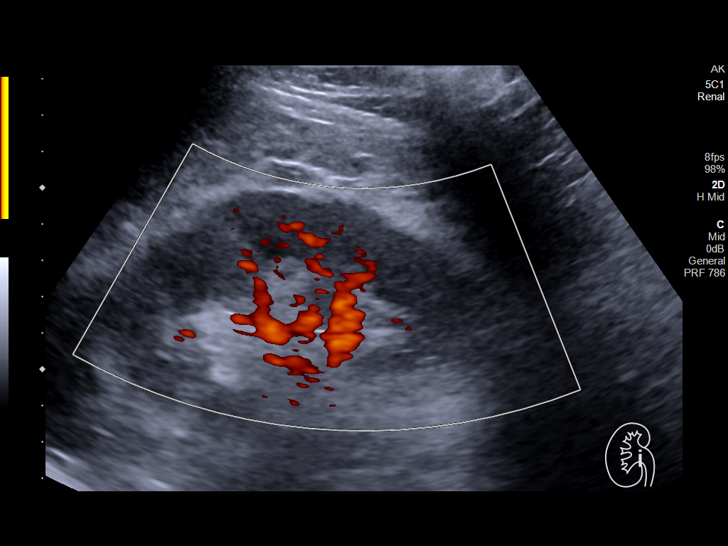
[im 14/54]
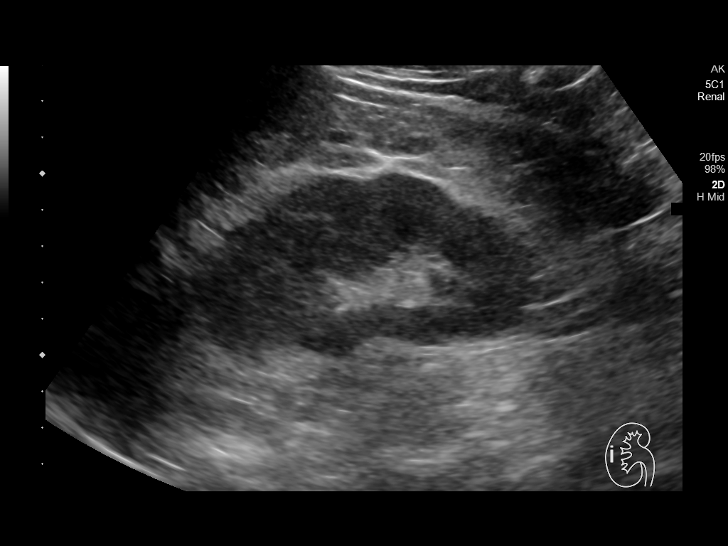
[im 18/54]
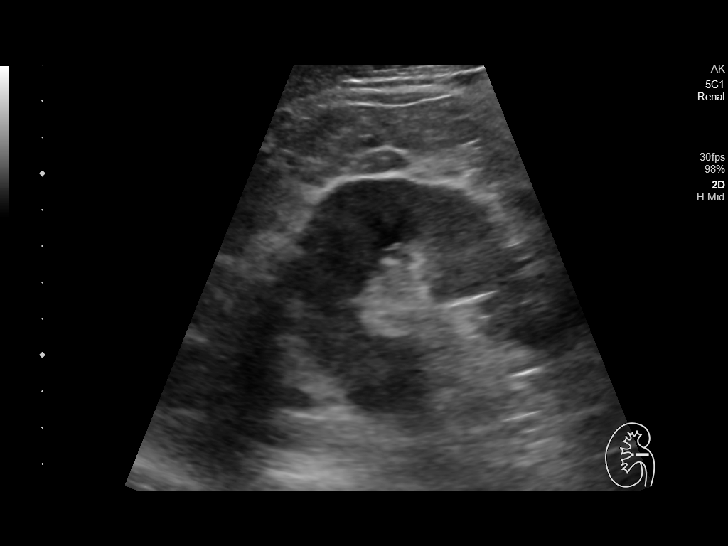
[im 20/54]
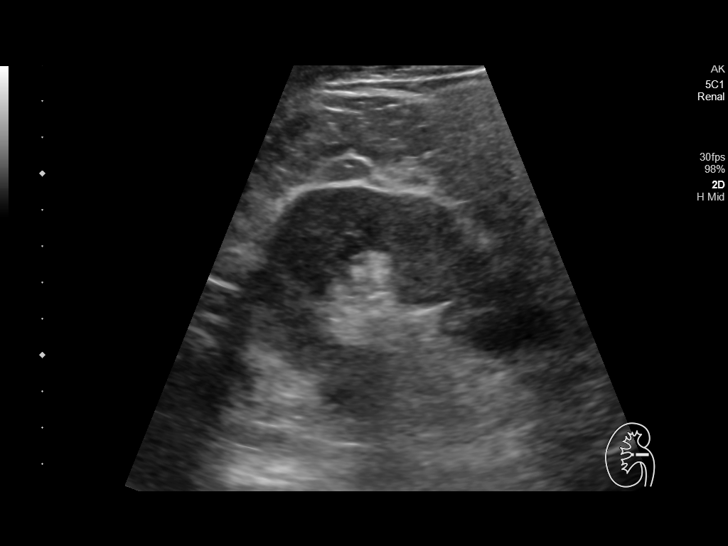
[im 25/54]
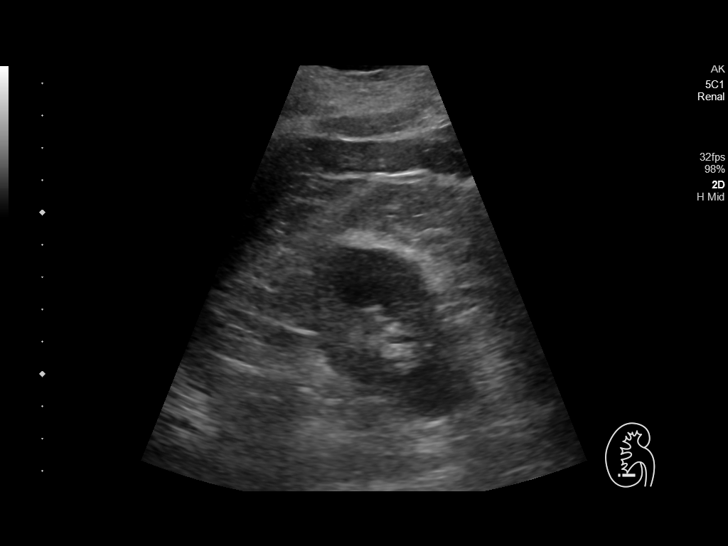
[im 29/54]
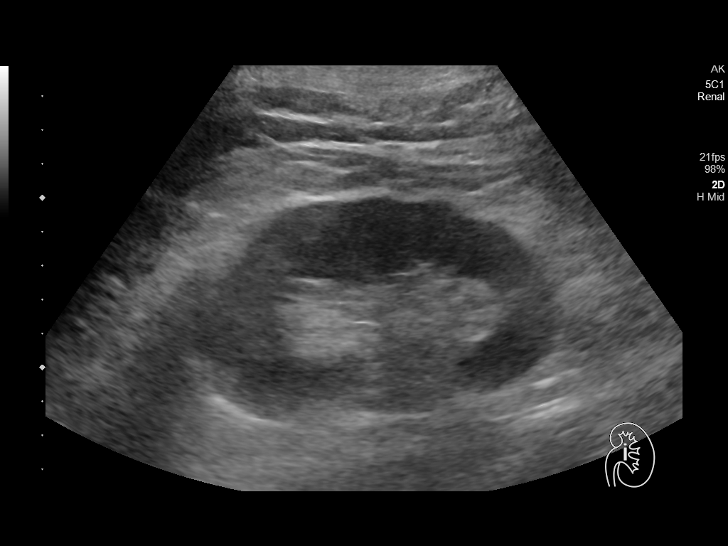
[im 34/54]
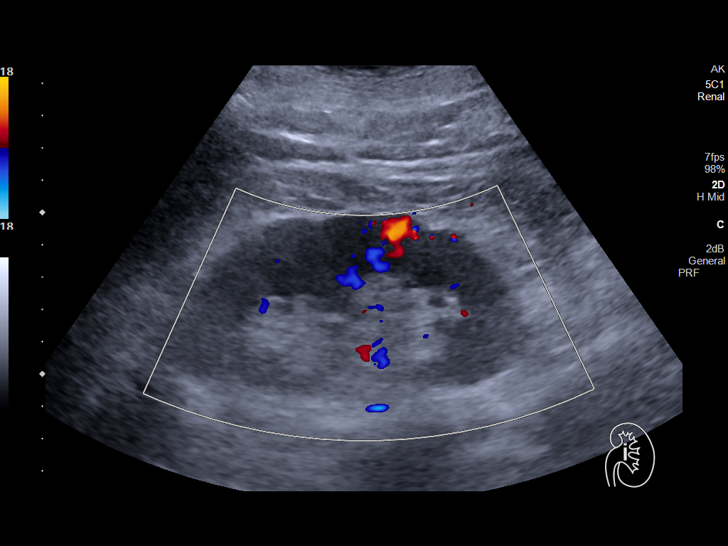
[im 36/54]
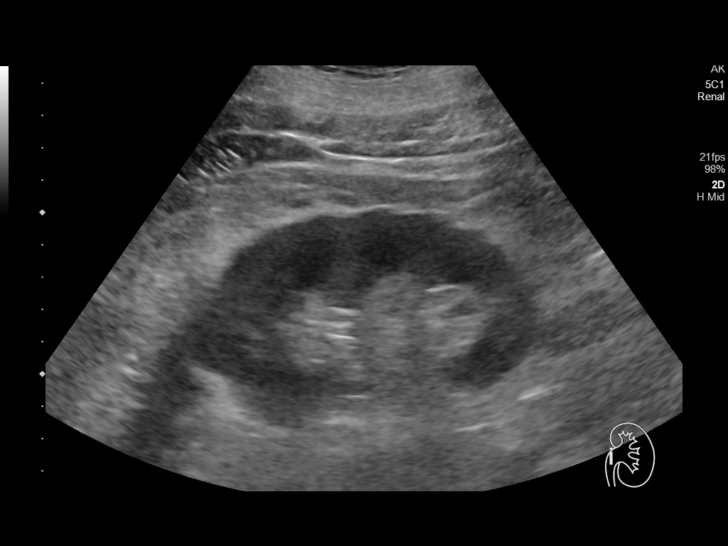
[im 40/54]
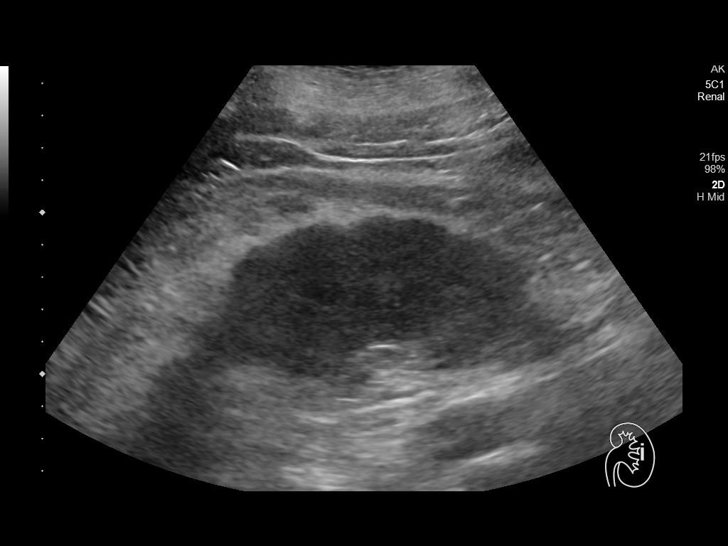
[im 45/54]
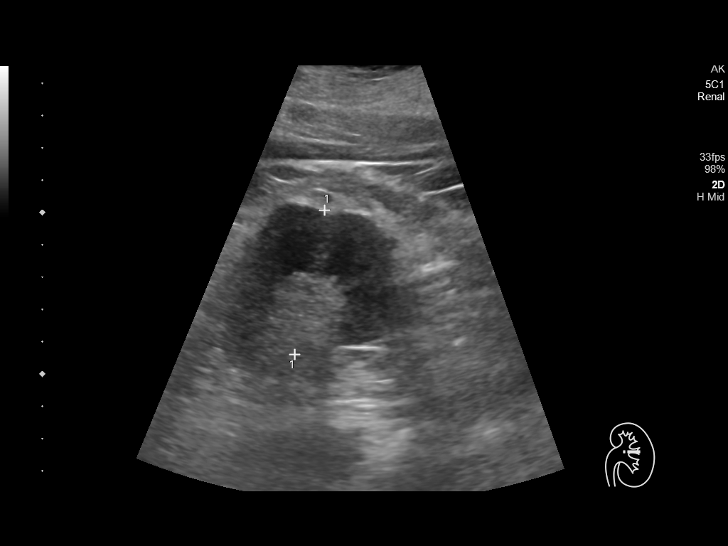
[im 49/54]
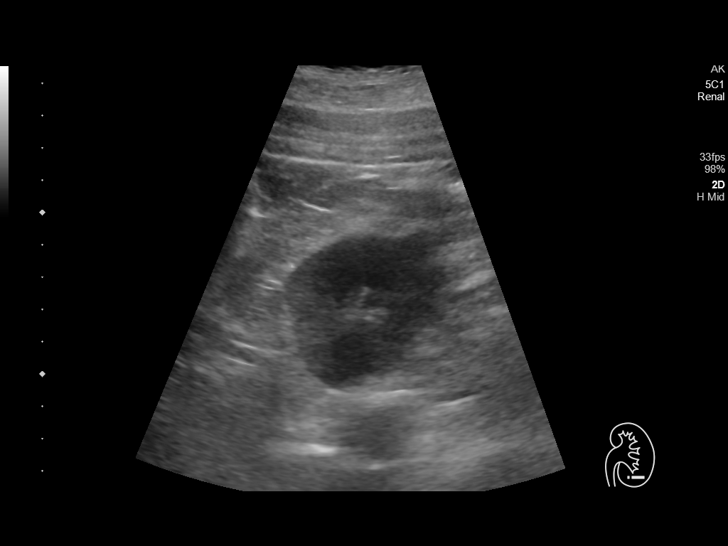
[im 54/54]
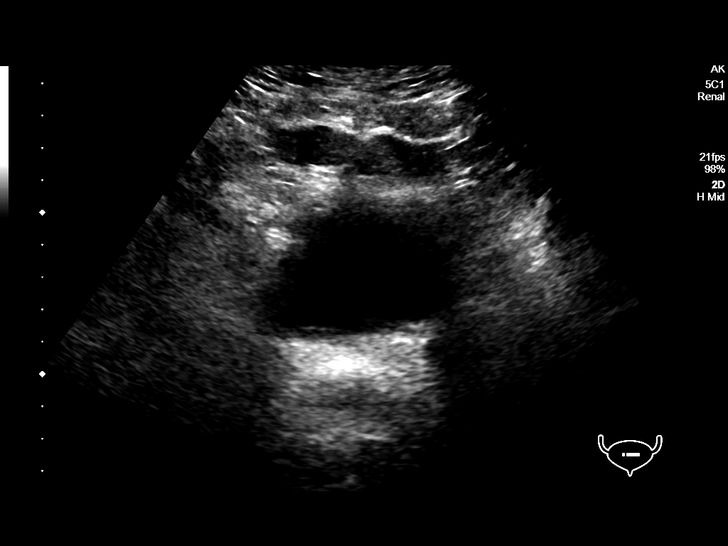

[14 of 25 positions shown; findings below may reference images not displayed]

FINDINGS: Right Kidney:

Renal measurements: 10.4 x 5.4 x 5.6 cm = volume: 164 mL .
Echogenicity within normal limits. No mass or hydronephrosis
visualized.

Left Kidney:

Renal measurements: 10.7 x 5.9 x 4.6 cm = volume: 150 mL.
Echogenicity within normal limits. No mass or hydronephrosis
visualized.

Bladder:

Appears normal for degree of bladder distention.

Other:

None.
IMPRESSION: Normal renal ultrasound.

## 2022-03-08 DIAGNOSIS — I110 Hypertensive heart disease with heart failure: Secondary | ICD-10-CM | POA: Insufficient documentation

## 2022-03-08 DIAGNOSIS — I872 Venous insufficiency (chronic) (peripheral): Secondary | ICD-10-CM | POA: Insufficient documentation

## 2022-03-08 DIAGNOSIS — F32A Depression, unspecified: Secondary | ICD-10-CM | POA: Insufficient documentation

## 2022-03-08 DIAGNOSIS — I69351 Hemiplegia and hemiparesis following cerebral infarction affecting right dominant side: Secondary | ICD-10-CM | POA: Insufficient documentation

## 2022-03-08 DIAGNOSIS — I152 Hypertension secondary to endocrine disorders: Secondary | ICD-10-CM | POA: Insufficient documentation

## 2022-03-09 DIAGNOSIS — N400 Enlarged prostate without lower urinary tract symptoms: Secondary | ICD-10-CM | POA: Insufficient documentation

## 2022-03-29 DIAGNOSIS — G4700 Insomnia, unspecified: Secondary | ICD-10-CM | POA: Insufficient documentation

## 2022-09-26 ENCOUNTER — Other Ambulatory Visit (INDEPENDENT_AMBULATORY_CARE_PROVIDER_SITE_OTHER): Payer: Self-pay | Admitting: Nurse Practitioner

## 2022-09-26 DIAGNOSIS — I872 Venous insufficiency (chronic) (peripheral): Secondary | ICD-10-CM

## 2022-10-01 ENCOUNTER — Ambulatory Visit (INDEPENDENT_AMBULATORY_CARE_PROVIDER_SITE_OTHER): Payer: Medicaid Other | Admitting: Nurse Practitioner

## 2022-10-01 ENCOUNTER — Ambulatory Visit (INDEPENDENT_AMBULATORY_CARE_PROVIDER_SITE_OTHER): Payer: Medicaid Other

## 2022-10-01 ENCOUNTER — Encounter (INDEPENDENT_AMBULATORY_CARE_PROVIDER_SITE_OTHER): Payer: Self-pay | Admitting: Nurse Practitioner

## 2022-10-01 VITALS — BP 163/94 | HR 73 | Resp 17 | Ht 71.0 in | Wt 280.0 lb

## 2022-10-01 DIAGNOSIS — R600 Localized edema: Secondary | ICD-10-CM | POA: Diagnosis not present

## 2022-10-01 DIAGNOSIS — I872 Venous insufficiency (chronic) (peripheral): Secondary | ICD-10-CM

## 2022-10-01 DIAGNOSIS — E119 Type 2 diabetes mellitus without complications: Secondary | ICD-10-CM

## 2022-10-01 DIAGNOSIS — I10 Essential (primary) hypertension: Secondary | ICD-10-CM | POA: Diagnosis not present

## 2022-10-08 ENCOUNTER — Encounter (INDEPENDENT_AMBULATORY_CARE_PROVIDER_SITE_OTHER): Payer: Self-pay

## 2022-10-08 ENCOUNTER — Ambulatory Visit (INDEPENDENT_AMBULATORY_CARE_PROVIDER_SITE_OTHER): Payer: Medicaid Other | Admitting: Nurse Practitioner

## 2022-10-08 ENCOUNTER — Encounter (INDEPENDENT_AMBULATORY_CARE_PROVIDER_SITE_OTHER): Payer: Self-pay | Admitting: Nurse Practitioner

## 2022-10-08 VITALS — BP 163/95 | HR 81 | Ht 71.0 in | Wt 280.0 lb

## 2022-10-08 DIAGNOSIS — R600 Localized edema: Secondary | ICD-10-CM | POA: Diagnosis not present

## 2022-10-08 NOTE — Progress Notes (Signed)
History of Present Illness  There is no documented history at this time  Assessments & Plan   There are no diagnoses linked to this encounter.    Additional instructions  Subjective:  Patient presents with venous ulcer of the Left lower extremity.    Procedure:  3 layer unna wrap was placed Left lower extremity.   Plan:   Follow up in one week.  

## 2022-10-08 NOTE — Progress Notes (Signed)
Subjective:    Patient ID: Brad Graham, male    DOB: [birth year], 62 y.o.   MRN: [id number] Chief Complaint  Patient presents with   New Patient (Initial Visit)    Consult for edema    Patient is seen for evaluation of leg pain and swelling associated with new onset ulceration. The patient first noticed the swelling remotely. The swelling is associated with pain and discoloration. The pain and swelling worsens with prolonged dependency and improves with elevation. The pain is unrelated to activity.  The patient notes that in the morning the legs are better but the leg symptoms worsened throughout the course of the day. The patient has also noted a progressive worsening of the discoloration in the ankle and shin area.   The patient notes that an ulcer has developed acutely without specific trauma and since it occurred it has been very slow to heal.  There is a moderate amount of drainage associated with the open area.  The wound is also very painful.  The patient denies claudication symptoms or rest pain symptoms.   The patient has not had any past angiography, interventions or vascular surgery.   Elevation makes the leg symptoms better, dependency makes them much worse. The patient denies any recent changes in medications.  The patient has not been wearing graduated compression.  The patient denies a history of DVT or PE. There is no prior history of phlebitis. There is no history of primary lymphedema.  No history of malignancies. No history of trauma or groin or pelvic surgery. There is no history of radiation treatment to the groin or pelvis    Today noninvasive studies show no evidence of DVT.  There is evidence of deep venous insufficiency.  No evidence of superficial venous reflux noted today.    Review of Systems  Cardiovascular:  Positive for leg swelling.  Skin:  Positive for wound.  All other systems reviewed and are negative.      Objective:   Physical  Exam Vitals reviewed.  HENT:     Head: Normocephalic.  Cardiovascular:     Rate and Rhythm: Normal rate.  Pulmonary:     Effort: Pulmonary effort is normal.  Musculoskeletal:     Left lower leg: Edema present.  Skin:    General: Skin is warm and dry.  Neurological:     Mental Status: He is alert and oriented to person, place, and time.  Psychiatric:        Mood and Affect: Mood normal.        Behavior: Behavior normal.        Thought Content: Thought content normal.        Judgment: Judgment normal.     BP (!) 163/94 (BP Location: Left Arm)   Pulse 73   Resp 17   Ht 5\' 11"  (1.803 m)   Wt 280 lb (127 kg)   BMI 39.05 kg/m   Past Medical History:  Diagnosis Date   Alcohol abuse    Alcohol abuse, continuous    Arthritis    Centrencephalic epilepsy (HCC)    Diabetes mellitus without complication (HCC)    H/O: CVA (cerebrovascular accident)    Hemiparesis affecting left side as late effect of cerebrovascular accident (HCC)    Hyperlipidemia    Hypertension    Syncope 07/02/2016   Syncope and collapse 01/27/2020    Social History   Socioeconomic History   Marital status: Single    Spouse name: Not on file  Number of children: Not on file   Years of education: Not on file   Highest education level: Not on file  Occupational History   Not on file  Tobacco Use   Smoking status: Former    Packs/day: 0.50    Types: Cigarettes   Smokeless tobacco: Never  Vaping Use   Vaping Use: Never used  Substance and Sexual Activity   Alcohol use: Not Currently    Comment: fifth every other day   Drug use: No   Sexual activity: Never  Other Topics Concern   Not on file  Social History Narrative   Not on file   Social Determinants of Health   Financial Resource Strain: Not on file  Food Insecurity: Not on file  Transportation Needs: Not on file  Physical Activity: Not on file  Stress: Not on file  Social Connections: Not on file  Intimate Partner Violence: Not on  file    Past Surgical History:  Procedure Laterality Date   none      Family History  Problem Relation Age of Onset   Diabetes Mother    Stroke Mother    Heart disease Maternal Grandmother    Heart disease Maternal Grandfather     No Known Allergies     Latest Ref Rng & Units 07/12/2020    9:27 PM 05/10/2020   12:57 PM 04/11/2020   10:38 AM  CBC  WBC 4.0 - 10.5 K/uL 3.3  3.3  4.5   Hemoglobin 13.0 - 17.0 g/dL 12.0  10.7  8.9   Hematocrit 39.0 - 52.0 % 39.1  33.5  28.8   Platelets 150 - 400 K/uL 215  207  403       CMP     Component Value Date/Time   NA 131 (L) 07/12/2020 2127   NA 137 02/16/2020 1426   NA 126 (L) 05/02/2014 0921   K 4.2 07/12/2020 2127   K 3.6 05/02/2014 0921   CL 94 (L) 07/12/2020 2127   CL 93 (L) 05/02/2014 0921   CO2 28 07/12/2020 2127   CO2 21 05/02/2014 0921   GLUCOSE 113 (H) 07/12/2020 2127   GLUCOSE 106 (H) 05/02/2014 0921   BUN 20 07/12/2020 2127   BUN 28 (H) 02/16/2020 1426   BUN 19 (H) 05/02/2014 0921   CREATININE 0.95 07/12/2020 2127   CREATININE 1.22 05/02/2014 0921   CALCIUM 9.6 07/12/2020 2127   CALCIUM 8.5 05/02/2014 0921   PROT 8.4 (H) 07/12/2020 2127   PROT 8.2 02/16/2020 1426   PROT 8.3 (H) 05/02/2014 0134   ALBUMIN 4.3 07/12/2020 2127   ALBUMIN 4.6 02/16/2020 1426   ALBUMIN 3.8 05/02/2014 0134   AST 25 07/12/2020 2127   AST 39 (H) 08/15/2015 1314   AST 30 05/02/2014 0134   ALT 23 07/12/2020 2127   ALT 20 08/15/2015 1314   ALT 26 05/02/2014 0134   ALKPHOS 151 (H) 07/12/2020 2127   ALKPHOS 102 05/02/2014 0134   BILITOT 0.4 07/12/2020 2127   BILITOT 0.6 02/16/2020 1426   BILITOT 0.3 05/02/2014 0134   GFRNONAA >60 07/12/2020 2127   GFRNONAA >60 05/02/2014 0921   GFRAA >60 05/10/2020 1249   GFRAA >60 05/02/2014 0921     No results found.     Assessment & Plan:   1. Bilateral leg edema The patient has significant edema in his left lower extremity.  There are multiple small areas of ulceration.  We will have  the patient placed an Unna boot today.  He does have some risk factors for possible peripheral arterial disease.  Will have him obtain ABIs at his follow-up visit.  In the interim we will have him undergo Unna boots on a weekly basis to be changed in our office.  2. Diabetes mellitus without complication ([place]) Continue hypoglycemic medications as already ordered, these medications have been reviewed and there are no changes at this time.  Hgb A1C to be monitored as already arranged by primary service  3. Essential hypertension Continue antihypertensive medications as already ordered, these medications have been reviewed and there are no changes at this time.   Current Outpatient Medications on File Prior to Visit  Medication Sig Dispense Refill   amLODipine (NORVASC) 10 MG tablet Take 10 mg by mouth daily.     carvedilol (COREG) 25 MG tablet Take 1 tablet (25 mg total) by mouth 2 (two) times daily with a meal. 60 tablet 0   Cholecalciferol (VITAMIN D) 50 MCG (2000 UT) CAPS Take 1 capsule by mouth every morning.     clopidogrel (PLAVIX) 75 MG tablet Take 75 mg by mouth daily.     folic acid (FOLVITE) 1 MG tablet Take 1 tablet (1 mg total) by mouth daily. 30 tablet 0   furosemide (LASIX) 40 MG tablet Take 40 mg by mouth.     magnesium oxide (MAG-OX) 400 (241.3 Mg) MG tablet Take 1 tablet (400 mg total) by mouth 2 (two) times daily. 60 tablet 0   OXYGEN Inhale 2 L into the lungs daily as needed.     senna-docusate (SENOKOT-S) 8.6-50 MG tablet Take 2 tablets by mouth at bedtime. 30 tablet 0   Skin Protectants, Misc. (MINERIN CREME EX) Apply 1 application topically every 12 (twelve) hours as needed.     sodium chloride 1 g tablet Take 1 g by mouth 2 (two) times daily with a meal.     tamsulosin (FLOMAX) 0.4 MG CAPS capsule Take 1 capsule (0.4 mg total) by mouth daily after supper. 30 capsule 0   thiamine 100 MG tablet Take 1 tablet (100 mg total) by mouth daily. 30 tablet 0   atorvastatin  (LIPITOR) 80 MG tablet Take 1 tablet (80 mg total) by mouth daily at 6 PM. 30 tablet 0   metFORMIN (GLUCOPHAGE) 1000 MG tablet Take 1 tablet (1,000 mg total) by mouth daily with breakfast. 30 tablet 0   pantoprazole (PROTONIX) 40 MG tablet Take 1 tablet (40 mg total) by mouth daily. 30 tablet 0   phenytoin (DILANTIN) 200 MG ER capsule Take 1 capsule (200 mg total) by mouth daily. 30 capsule 0   phenytoin (DILANTIN) 300 MG ER capsule Take 1 capsule (300 mg total) by mouth at bedtime. (Patient not taking: Reported on 07/12/2020) 30 capsule 0   No current facility-administered medications on file prior to visit.    There are no Patient Instructions on file for this visit. No follow-ups on file.   Kris Hartmann, NP

## 2022-10-15 ENCOUNTER — Ambulatory Visit (INDEPENDENT_AMBULATORY_CARE_PROVIDER_SITE_OTHER): Payer: Medicaid Other | Admitting: Nurse Practitioner

## 2022-10-15 DIAGNOSIS — I872 Venous insufficiency (chronic) (peripheral): Secondary | ICD-10-CM

## 2022-10-15 NOTE — Progress Notes (Signed)
History of Present Illness  There is no documented history at this time  Assessments & Plan   There are no diagnoses linked to this encounter.    Additional instructions  Subjective:  Patient presents with venous ulcer of the Left lower extremity.    Procedure:  3 layer unna wrap was placed Left lower extremity.   Plan:   Follow up in one week.  

## 2022-10-16 ENCOUNTER — Encounter (INDEPENDENT_AMBULATORY_CARE_PROVIDER_SITE_OTHER): Payer: Self-pay | Admitting: Nurse Practitioner

## 2022-10-22 ENCOUNTER — Encounter (INDEPENDENT_AMBULATORY_CARE_PROVIDER_SITE_OTHER): Payer: Self-pay | Admitting: Nurse Practitioner

## 2022-10-22 ENCOUNTER — Ambulatory Visit (INDEPENDENT_AMBULATORY_CARE_PROVIDER_SITE_OTHER): Payer: Medicaid Other | Admitting: Nurse Practitioner

## 2022-10-22 DIAGNOSIS — I872 Venous insufficiency (chronic) (peripheral): Secondary | ICD-10-CM | POA: Diagnosis not present

## 2022-10-22 NOTE — Progress Notes (Signed)
History of Present Illness  There is no documented history at this time  Assessments & Plan   There are no diagnoses linked to this encounter.    Additional instructions  Subjective:  Patient presents with venous ulcer of the Left lower extremity.    Procedure:  3 layer unna wrap was placed Left lower extremity.   Plan:   Follow up in one week.  

## 2022-10-29 ENCOUNTER — Ambulatory Visit (INDEPENDENT_AMBULATORY_CARE_PROVIDER_SITE_OTHER): Payer: Medicaid Other | Admitting: Nurse Practitioner

## 2022-10-29 ENCOUNTER — Encounter (INDEPENDENT_AMBULATORY_CARE_PROVIDER_SITE_OTHER): Payer: Self-pay | Admitting: Nurse Practitioner

## 2022-10-29 DIAGNOSIS — I872 Venous insufficiency (chronic) (peripheral): Secondary | ICD-10-CM | POA: Diagnosis not present

## 2022-10-29 NOTE — Progress Notes (Signed)
History of Present Illness  There is no documented history at this time  Assessments & Plan   There are no diagnoses linked to this encounter.    Additional instructions  Subjective:  Patient presents with venous ulcer of the Left lower extremity.    Procedure:  3 layer unna wrap was placed Left lower extremity.   Plan:   Follow up in one week.  

## 2022-10-30 ENCOUNTER — Other Ambulatory Visit (INDEPENDENT_AMBULATORY_CARE_PROVIDER_SITE_OTHER): Payer: Self-pay | Admitting: Nurse Practitioner

## 2022-10-30 DIAGNOSIS — I872 Venous insufficiency (chronic) (peripheral): Secondary | ICD-10-CM

## 2022-10-30 DIAGNOSIS — L97909 Non-pressure chronic ulcer of unspecified part of unspecified lower leg with unspecified severity: Secondary | ICD-10-CM

## 2022-11-05 ENCOUNTER — Ambulatory Visit (INDEPENDENT_AMBULATORY_CARE_PROVIDER_SITE_OTHER): Payer: Medicaid Other

## 2022-11-05 ENCOUNTER — Encounter (INDEPENDENT_AMBULATORY_CARE_PROVIDER_SITE_OTHER): Payer: Self-pay | Admitting: Nurse Practitioner

## 2022-11-05 ENCOUNTER — Ambulatory Visit (INDEPENDENT_AMBULATORY_CARE_PROVIDER_SITE_OTHER): Payer: Medicaid Other | Admitting: Nurse Practitioner

## 2022-11-05 VITALS — BP 160/91 | HR 80 | Resp 16 | Wt 283.2 lb

## 2022-11-05 DIAGNOSIS — L97909 Non-pressure chronic ulcer of unspecified part of unspecified lower leg with unspecified severity: Secondary | ICD-10-CM | POA: Diagnosis not present

## 2022-11-05 DIAGNOSIS — I10 Essential (primary) hypertension: Secondary | ICD-10-CM | POA: Diagnosis not present

## 2022-11-05 DIAGNOSIS — R600 Localized edema: Secondary | ICD-10-CM

## 2022-11-05 DIAGNOSIS — E119 Type 2 diabetes mellitus without complications: Secondary | ICD-10-CM | POA: Diagnosis not present

## 2022-11-06 ENCOUNTER — Encounter (INDEPENDENT_AMBULATORY_CARE_PROVIDER_SITE_OTHER): Payer: Self-pay | Admitting: Nurse Practitioner

## 2022-11-06 NOTE — Progress Notes (Signed)
Subjective:    Patient ID: Brad Graham, male    DOB: July 28, 1961, 62 y.o.   MRN: [id number] Chief Complaint  Patient presents with   Follow-up    Ultrasound and unna wrap follow up    Patient is seen for follow up evaluation of leg pain and swelling associated with venous ulceration. The patient was recently seen here and started on Unna boot therapy.  The swelling abruptly became much worse bilaterally and is associated with pain and discoloration. The pain and swelling worsens with prolonged dependency and improves with elevation.  The patient notes that in the morning the legs are better but the leg symptoms worsened throughout the course of the day. The patient has also noted a progressive worsening of the discoloration in the ankle and shin area.   The patient notes that an ulcer has developed acutely without specific trauma and since it occurred it has been very slow to heal.  There is a moderate amount of drainage associated with the open area.  The wound was also painful.  He has been indicated for the last several weeks and notes that the swelling is much better and sore the wounds.  He still has 2 small areas of wound.  The patient states that they have been elevating as much as possible. The patient denies any recent changes in medications.  The patient denies a history of DVT or PE. There is no prior history of phlebitis. There is no history of primary lymphedema.  No SOB or increased cough.  No sputum production.  No recent episodes of CHF exacerbation.   Previous,, noninvasive studies show no evidence of DVT.  There is evidence of deep venous insufficiency.  No evidence of superficial venous reflux noted today.  Today the patient had noncompressible ABIs bilaterally but normal TBI's bilaterally.  He had strong triphasic tibial artery waveforms in the right with triphasic/biphasic in the left.    Review of Systems  Cardiovascular:  Positive for leg swelling.  Skin:   Positive for wound.  All other systems reviewed and are negative.      Objective:   Physical Exam Vitals reviewed.  HENT:     Head: Normocephalic.  Cardiovascular:     Rate and Rhythm: Normal rate.  Pulmonary:     Effort: Pulmonary effort is normal.  Musculoskeletal:     Left lower leg: Edema present.  Skin:    General: Skin is warm and dry.  Neurological:     Mental Status: He is alert and oriented to person, place, and time.  Psychiatric:        Mood and Affect: Mood normal.        Behavior: Behavior normal.        Thought Content: Thought content normal.        Judgment: Judgment normal.     BP (!) 160/91 (BP Location: Right Arm)   Pulse 80   Resp 16   Wt 283 lb 3.2 oz (128.5 kg)   BMI 39.50 kg/m   Past Medical History:  Diagnosis Date   Alcohol abuse    Alcohol abuse, continuous    Arthritis    Centrencephalic epilepsy ([place])    Diabetes mellitus without complication ([place])    H/O: CVA (cerebrovascular accident)    Hemiparesis affecting left side as late effect of cerebrovascular accident ([place])    Hyperlipidemia    Hypertension    Syncope 07/02/2016   Syncope and collapse 01/27/2020    Social History  Socioeconomic History   Marital status: Single    Spouse name: Not on file   Number of children: Not on file   Years of education: Not on file   Highest education level: Not on file  Occupational History   Not on file  Tobacco Use   Smoking status: Former    Packs/day: 0.50    Types: Cigarettes   Smokeless tobacco: Never  Vaping Use   Vaping Use: Never used  Substance and Sexual Activity   Alcohol use: Not Currently    Comment: fifth every other day   Drug use: No   Sexual activity: Never  Other Topics Concern   Not on file  Social History Narrative   Not on file   Social Determinants of Health   Financial Resource Strain: Not on file  Food Insecurity: Not on file  Transportation Needs: Not on file  Physical Activity: Not on file   Stress: Not on file  Social Connections: Not on file  Intimate Partner Violence: Not on file    Past Surgical History:  Procedure Laterality Date   none      Family History  Problem Relation Age of Onset   Diabetes Mother    Stroke Mother    Heart disease Maternal Grandmother    Heart disease Maternal Grandfather     No Known Allergies     Latest Ref Rng & Units 07/12/2020    9:27 PM 05/10/2020   12:57 PM 04/11/2020   10:38 AM  CBC  WBC 4.0 - 10.5 K/uL 3.3  3.3  4.5   Hemoglobin 13.0 - 17.0 g/dL 12.0  10.7  8.9   Hematocrit 39.0 - 52.0 % 39.1  33.5  28.8   Platelets 150 - 400 K/uL 215  207  403       CMP     Component Value Date/Time   NA 131 (L) 07/12/2020 2127   NA 137 02/16/2020 1426   NA 126 (L) 05/02/2014 0921   K 4.2 07/12/2020 2127   K 3.6 05/02/2014 0921   CL 94 (L) 07/12/2020 2127   CL 93 (L) 05/02/2014 0921   CO2 28 07/12/2020 2127   CO2 21 05/02/2014 0921   GLUCOSE 113 (H) 07/12/2020 2127   GLUCOSE 106 (H) 05/02/2014 0921   BUN 20 07/12/2020 2127   BUN 28 (H) 02/16/2020 1426   BUN 19 (H) 05/02/2014 0921   CREATININE 0.95 07/12/2020 2127   CREATININE 1.22 05/02/2014 0921   CALCIUM 9.6 07/12/2020 2127   CALCIUM 8.5 05/02/2014 0921   PROT 8.4 (H) 07/12/2020 2127   PROT 8.2 02/16/2020 1426   PROT 8.3 (H) 05/02/2014 0134   ALBUMIN 4.3 07/12/2020 2127   ALBUMIN 4.6 02/16/2020 1426   ALBUMIN 3.8 05/02/2014 0134   AST 25 07/12/2020 2127   AST 39 (H) 08/15/2015 1314   AST 30 05/02/2014 0134   ALT 23 07/12/2020 2127   ALT 20 08/15/2015 1314   ALT 26 05/02/2014 0134   ALKPHOS 151 (H) 07/12/2020 2127   ALKPHOS 102 05/02/2014 0134   BILITOT 0.4 07/12/2020 2127   BILITOT 0.6 02/16/2020 1426   BILITOT 0.3 05/02/2014 0134   GFRNONAA >60 07/12/2020 2127   GFRNONAA >60 05/02/2014 0921   GFRAA >60 05/10/2020 1249   GFRAA >60 05/02/2014 0921     No results found.     Assessment & Plan:   1. Bilateral leg edema The patient has improvement in  the edema in his left lower extremity.  He only has 2 small areas of ulceration remaining.  Will continue to wrap with Unna boots for the next 2 to 3 weeks.  At which time the patient should be able to transition to medical grade compression socks. 2. Diabetes mellitus without complication ([place]) Continue hypoglycemic medications as already ordered, these medications have been reviewed and there are no changes at this time.  Hgb A1C to be monitored as already arranged by primary service  3. Essential hypertension Continue antihypertensive medications as already ordered, these medications have been reviewed and there are no changes at this time.   Current Outpatient Medications on File Prior to Visit  Medication Sig Dispense Refill   amLODipine (NORVASC) 10 MG tablet Take 10 mg by mouth daily.     carvedilol (COREG) 25 MG tablet Take 1 tablet (25 mg total) by mouth 2 (two) times daily with a meal. 60 tablet 0   Cholecalciferol (VITAMIN D) 50 MCG (2000 UT) CAPS Take 1 capsule by mouth every morning.     clopidogrel (PLAVIX) 75 MG tablet Take 75 mg by mouth daily.     folic acid (FOLVITE) 1 MG tablet Take 1 tablet (1 mg total) by mouth daily. 30 tablet 0   furosemide (LASIX) 40 MG tablet Take 40 mg by mouth.     magnesium oxide (MAG-OX) 400 (241.3 Mg) MG tablet Take 1 tablet (400 mg total) by mouth 2 (two) times daily. 60 tablet 0   OXYGEN Inhale 2 L into the lungs daily as needed.     senna-docusate (SENOKOT-S) 8.6-50 MG tablet Take 2 tablets by mouth at bedtime. 30 tablet 0   Skin Protectants, Misc. (MINERIN CREME EX) Apply 1 application topically every 12 (twelve) hours as needed.     sodium chloride 1 g tablet Take 1 g by mouth 2 (two) times daily with a meal.     tamsulosin (FLOMAX) 0.4 MG CAPS capsule Take 1 capsule (0.4 mg total) by mouth daily after supper. 30 capsule 0   thiamine 100 MG tablet Take 1 tablet (100 mg total) by mouth daily. 30 tablet 0   atorvastatin (LIPITOR) 80 MG tablet  Take 1 tablet (80 mg total) by mouth daily at 6 PM. 30 tablet 0   metFORMIN (GLUCOPHAGE) 1000 MG tablet Take 1 tablet (1,000 mg total) by mouth daily with breakfast. 30 tablet 0   pantoprazole (PROTONIX) 40 MG tablet Take 1 tablet (40 mg total) by mouth daily. 30 tablet 0   phenytoin (DILANTIN) 200 MG ER capsule Take 1 capsule (200 mg total) by mouth daily. 30 capsule 0   phenytoin (DILANTIN) 300 MG ER capsule Take 1 capsule (300 mg total) by mouth at bedtime. (Patient not taking: Reported on 07/12/2020) 30 capsule 0   No current facility-administered medications on file prior to visit.    There are no Patient Instructions on file for this visit. No follow-ups on file.   Kris Hartmann, NP

## 2022-11-12 ENCOUNTER — Encounter (INDEPENDENT_AMBULATORY_CARE_PROVIDER_SITE_OTHER): Payer: Self-pay

## 2022-11-12 ENCOUNTER — Ambulatory Visit (INDEPENDENT_AMBULATORY_CARE_PROVIDER_SITE_OTHER): Payer: Medicaid Other | Admitting: Nurse Practitioner

## 2022-11-12 VITALS — BP 159/88 | HR 70 | Resp 16 | Wt 284.0 lb

## 2022-11-12 DIAGNOSIS — L97909 Non-pressure chronic ulcer of unspecified part of unspecified lower leg with unspecified severity: Secondary | ICD-10-CM

## 2022-11-12 NOTE — Progress Notes (Signed)
History of Present Illness  There is no documented history at this time  Assessments & Plan   There are no diagnoses linked to this encounter.    Additional instructions  Subjective:  Patient presents with venous ulcer of the Left lower extremity.    Procedure:  3 layer unna wrap was placed Left lower extremity.   Plan:   Follow up in one week.  

## 2022-11-19 ENCOUNTER — Encounter (INDEPENDENT_AMBULATORY_CARE_PROVIDER_SITE_OTHER): Payer: Self-pay | Admitting: Nurse Practitioner

## 2022-11-19 ENCOUNTER — Ambulatory Visit (INDEPENDENT_AMBULATORY_CARE_PROVIDER_SITE_OTHER): Payer: Medicaid Other | Admitting: Nurse Practitioner

## 2022-11-19 VITALS — BP 162/94 | HR 78 | Resp 16

## 2022-11-19 DIAGNOSIS — L97909 Non-pressure chronic ulcer of unspecified part of unspecified lower leg with unspecified severity: Secondary | ICD-10-CM

## 2022-11-19 NOTE — Progress Notes (Signed)
History of Present Illness  There is no documented history at this time  Assessments & Plan   There are no diagnoses linked to this encounter.    Additional instructions  Subjective:  Patient presents with venous ulcer of the Left lower extremity.    Procedure:  3 layer unna wrap was placed Left lower extremity.   Plan:   Follow up in one week.  

## 2022-11-26 ENCOUNTER — Ambulatory Visit (INDEPENDENT_AMBULATORY_CARE_PROVIDER_SITE_OTHER): Payer: Medicaid Other | Admitting: Nurse Practitioner

## 2022-11-26 ENCOUNTER — Encounter (INDEPENDENT_AMBULATORY_CARE_PROVIDER_SITE_OTHER): Payer: Self-pay

## 2022-11-26 VITALS — BP 167/96 | HR 72 | Resp 18

## 2022-11-26 DIAGNOSIS — L97909 Non-pressure chronic ulcer of unspecified part of unspecified lower leg with unspecified severity: Secondary | ICD-10-CM

## 2022-11-26 NOTE — Progress Notes (Unsigned)
History of Present Illness  There is no documented history at this time  Assessments & Plan   There are no diagnoses linked to this encounter.    Additional instructions  Subjective:  Patient presents with venous ulcer of the Left lower extremity.    Procedure:  3 layer unna wrap was placed Left lower extremity.   Plan:   Follow up in one week.  

## 2022-11-27 ENCOUNTER — Encounter (INDEPENDENT_AMBULATORY_CARE_PROVIDER_SITE_OTHER): Payer: Self-pay | Admitting: Nurse Practitioner

## 2022-12-03 ENCOUNTER — Encounter (INDEPENDENT_AMBULATORY_CARE_PROVIDER_SITE_OTHER): Payer: Self-pay | Admitting: Nurse Practitioner

## 2022-12-03 ENCOUNTER — Ambulatory Visit (INDEPENDENT_AMBULATORY_CARE_PROVIDER_SITE_OTHER): Payer: Medicaid Other | Admitting: Nurse Practitioner

## 2022-12-03 VITALS — BP 161/93 | HR 75 | Resp 18 | Ht 71.0 in | Wt 284.0 lb

## 2022-12-03 DIAGNOSIS — I10 Essential (primary) hypertension: Secondary | ICD-10-CM | POA: Diagnosis not present

## 2022-12-03 DIAGNOSIS — I872 Venous insufficiency (chronic) (peripheral): Secondary | ICD-10-CM | POA: Diagnosis not present

## 2022-12-03 DIAGNOSIS — L97909 Non-pressure chronic ulcer of unspecified part of unspecified lower leg with unspecified severity: Secondary | ICD-10-CM

## 2022-12-04 ENCOUNTER — Encounter (INDEPENDENT_AMBULATORY_CARE_PROVIDER_SITE_OTHER): Payer: Self-pay | Admitting: Nurse Practitioner

## 2022-12-04 NOTE — Progress Notes (Signed)
Subjective:    Patient ID: Brad Graham, male    DOB: 18-Sep-1961, 62 y.o.   MRN: [id number] Chief Complaint  Patient presents with   Follow-up    3 week unna boot     The patient returns to the office for followup evaluation regarding leg swelling.  The swelling has improved quite a bit and the pain associated with swelling has decreased substantially. There have not been any interval development of a ulcerations or wounds.  The patient has been in [place] boots but his wounds are now currently healed.  The patient also states elevation during the day and exercise (such as walking) is being done too.       Review of Systems  Cardiovascular:  Positive for leg swelling.  Skin:  Negative for wound.  All other systems reviewed and are negative.      Objective:   Physical Exam Vitals reviewed.  HENT:     Head: Normocephalic.  Cardiovascular:     Rate and Rhythm: Normal rate.  Pulmonary:     Effort: Pulmonary effort is normal.  Musculoskeletal:     Right lower leg: Edema present.     Left lower leg: Edema present.  Skin:    General: Skin is warm and dry.  Neurological:     Mental Status: He is alert and oriented to person, place, and time.  Psychiatric:        Mood and Affect: Mood normal.        Behavior: Behavior normal.        Thought Content: Thought content normal.        Judgment: Judgment normal.     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[place])    Diabetes mellitus without complication ([place])    H/O: CVA (cerebrovascular accident)    Hemiparesis affecting left side as late effect of cerebrovascular accident ([place])    Hyperlipidemia    Hypertension    Syncope 07/02/2016   Syncope and collapse 01/27/2020    Social History   Socioeconomic  History   Marital status: Single    Spouse name: Not on file   Number of children: Not on file   Years of education: Not on file   Highest education level: Not on file  Occupational History   Not on file  Tobacco Use   Smoking status: Former    Packs/day: 0.50    Types: Cigarettes   Smokeless tobacco: Never  Vaping Use   Vaping Use: Never used  Substance and Sexual Activity   Alcohol use: Not Currently    Comment: fifth every other day   Drug use: No   Sexual activity: Never  Other Topics Concern   Not on file  Social History Narrative   Not on file   Social Determinants of Health   Financial Resource Strain: Not on file  Food Insecurity: Not on file  Transportation Needs: Not on file  Physical Activity: Not on file  Stress: Not on file  Social Connections: Not on file  Intimate Partner Violence: Not on file    Past Surgical History:  Procedure Laterality Date   none      Family  History  Problem Relation Age of Onset   Diabetes Mother    Stroke Mother    Heart disease Maternal Grandmother    Heart disease Maternal Grandfather     No Known Allergies     Latest Ref Rng & Units 07/12/2020    9:27 PM 05/10/2020   12:57 PM 04/11/2020   10:38 AM  CBC  WBC 4.0 - 10.5 K/uL 3.3  3.3  4.5   Hemoglobin 13.0 - 17.0 g/dL 12.0  10.7  8.9   Hematocrit 39.0 - 52.0 % 39.1  33.5  28.8   Platelets 150 - 400 K/uL 215  207  403       CMP     Component Value Date/Time   NA 131 (L) 07/12/2020 2127   NA 137 02/16/2020 1426   NA 126 (L) 05/02/2014 0921   K 4.2 07/12/2020 2127   K 3.6 05/02/2014 0921   CL 94 (L) 07/12/2020 2127   CL 93 (L) 05/02/2014 0921   CO2 28 07/12/2020 2127   CO2 21 05/02/2014 0921   GLUCOSE 113 (H) 07/12/2020 2127   GLUCOSE 106 (H) 05/02/2014 0921   BUN 20 07/12/2020 2127   BUN 28 (H) 02/16/2020 1426   BUN 19 (H) 05/02/2014 0921   CREATININE 0.95 07/12/2020 2127   CREATININE 1.22 05/02/2014 0921   CALCIUM 9.6 07/12/2020 2127   CALCIUM  8.5 05/02/2014 0921   PROT 8.4 (H) 07/12/2020 2127   PROT 8.2 02/16/2020 1426   PROT 8.3 (H) 05/02/2014 0134   ALBUMIN 4.3 07/12/2020 2127   ALBUMIN 4.6 02/16/2020 1426   ALBUMIN 3.8 05/02/2014 0134   AST 25 07/12/2020 2127   AST 39 (H) 08/15/2015 1314   AST 30 05/02/2014 0134   ALT 23 07/12/2020 2127   ALT 20 08/15/2015 1314   ALT 26 05/02/2014 0134   ALKPHOS 151 (H) 07/12/2020 2127   ALKPHOS 102 05/02/2014 0134   BILITOT 0.4 07/12/2020 2127   BILITOT 0.6 02/16/2020 1426   BILITOT 0.3 05/02/2014 0134   GFRNONAA >60 07/12/2020 2127   GFRNONAA >60 05/02/2014 0921   GFRAA >60 05/10/2020 1249   GFRAA >60 05/02/2014 0921     VAS [place] ABI WITH/WO TBI  Result Date: 11/05/2022  LOWER EXTREMITY DOPPLER STUDY Patient Name:  Brad Graham  Date of Exam:   11/05/2022 Medical Rec #: [id number]         Accession #:    [id number] Date of Birth: [birth year]          Patient Gender: M Patient Age:   62 years Exam Location:  [facility] Procedure:      VAS [place] ABI WITH/WO TBI Referring Phys: --------------------------------------------------------------------------------  High Risk Factors: Diabetes. Other Factors: Moderate edema.  Performing Technologist: Concha [place] RVT  Examination Guidelines: A complete evaluation includes at minimum, Doppler waveform signals and systolic blood pressure reading at the level of bilateral brachial, anterior tibial, and posterior tibial arteries, when vessel segments are accessible. Bilateral testing is considered an integral part of a complete examination. Photoelectric Plethysmograph (PPG) waveforms and toe systolic pressure readings are included as required and additional duplex testing as needed. Limited examinations for reoccurring indications may be performed as noted.  ABI Findings: +---------+------------------+-----+---------+--------+ Right    Rt Pressure (mmHg)IndexWaveform Comment  +---------+------------------+-----+---------+--------+  Brachial 145                                      +---------+------------------+-----+---------+--------+  ATA                             triphasicNonComp  +---------+------------------+-----+---------+--------+ PTA                             triphasicNonComp  +---------+------------------+-----+---------+--------+ Great Toe139               0.96 Normal            +---------+------------------+-----+---------+--------+ +---------+------------------+-----+---------+-------+ Left     Lt Pressure (mmHg)IndexWaveform Comment +---------+------------------+-----+---------+-------+ Brachial 145                                     +---------+------------------+-----+---------+-------+ ATA                             triphasicNonComp +---------+------------------+-----+---------+-------+ PTA                             biphasic NonComp +---------+------------------+-----+---------+-------+ Great Toe109               0.75 Normal           +---------+------------------+-----+---------+-------+  Summary: Right: Resting right ankle-brachial index indicates noncompressible right lower extremity arteries. The right toe-brachial index is normal. NonComp due to moderate edema. Normal waveforms bilaterally. Left: Resting left ankle-brachial index indicates noncompressible left lower extremity arteries. The left toe-brachial index is normal. NonComp due to moderate edema. Normal waveforms bilaterally. *See table(s) above for measurements and observations.  Electronically signed by Hortencia Pilar MD on 11/05/2022 at 3:46:52 PM.    Final        Assessment & Plan:   1. Ulcer of lower extremity, unspecified laterality, unspecified ulcer stage ([place]) Currently wounds are healed patient is advised to transition to compression as noted below.  2. Venous insufficiency (chronic) (peripheral) The patient will transition to medical grade compression stockings.  This information was  given to his nursing facility.  He should wear his medical grade compression stockings daily.  He is instructed to not sleep in these.  Will plan to have the patient return in 3 months.  3. Essential hypertension Continue antihypertensive medications as already ordered, these medications have been reviewed and there are no changes at this time.  Blood pressure is elevated, patient is instructed to have them evaluated at his facility for continued elevation.   Current Outpatient Medications on File Prior to Visit  Medication Sig Dispense Refill   amLODipine (NORVASC) 10 MG tablet Take 10 mg by mouth daily.     carvedilol (COREG) 25 MG tablet Take 1 tablet (25 mg total) by mouth 2 (two) times daily with a meal. 60 tablet 0   Cholecalciferol (VITAMIN D) 50 MCG (2000 UT) CAPS Take 1 capsule by mouth every morning.     clopidogrel (PLAVIX) 75 MG tablet Take 75 mg by mouth daily.     folic acid (FOLVITE) 1 MG tablet Take 1 tablet (1 mg total) by mouth daily. 30 tablet 0   furosemide (LASIX) 40 MG tablet Take 40 mg by mouth.     magnesium oxide (MAG-OX) 400 (241.3 Mg) MG tablet Take 1 tablet (400 mg total) by mouth 2 (two) times daily. 60 tablet 0   OXYGEN Inhale 2 L into the  lungs daily as needed.     senna-docusate (SENOKOT-S) 8.6-50 MG tablet Take 2 tablets by mouth at bedtime. 30 tablet 0   sertraline (ZOLOFT) 50 MG tablet Take 50 mg by mouth daily.     Skin Protectants, Misc. (MINERIN CREME EX) Apply 1 application topically every 12 (twelve) hours as needed.     sodium chloride 1 g tablet Take 1 g by mouth 2 (two) times daily with a meal.     tamsulosin (FLOMAX) 0.4 MG CAPS capsule Take 1 capsule (0.4 mg total) by mouth daily after supper. 30 capsule 0   thiamine 100 MG tablet Take 1 tablet (100 mg total) by mouth daily. 30 tablet 0   atorvastatin (LIPITOR) 80 MG tablet Take 1 tablet (80 mg total) by mouth daily at 6 PM. 30 tablet 0   metFORMIN (GLUCOPHAGE) 1000 MG tablet Take 1 tablet (1,000 mg  total) by mouth daily with breakfast. 30 tablet 0   pantoprazole (PROTONIX) 40 MG tablet Take 1 tablet (40 mg total) by mouth daily. 30 tablet 0   phenytoin (DILANTIN) 200 MG ER capsule Take 1 capsule (200 mg total) by mouth daily. 30 capsule 0   phenytoin (DILANTIN) 300 MG ER capsule Take 1 capsule (300 mg total) by mouth at bedtime. (Patient not taking: Reported on 07/12/2020) 30 capsule 0   No current facility-administered medications on file prior to visit.    There are no Patient Instructions on file for this visit. No follow-ups on file.   Kris Hartmann, NP

## 2023-03-05 ENCOUNTER — Ambulatory Visit (INDEPENDENT_AMBULATORY_CARE_PROVIDER_SITE_OTHER): Payer: Medicaid Other | Admitting: Nurse Practitioner

## 2023-03-05 ENCOUNTER — Encounter (INDEPENDENT_AMBULATORY_CARE_PROVIDER_SITE_OTHER): Payer: Self-pay | Admitting: Nurse Practitioner

## 2023-03-05 VITALS — BP 119/79 | HR 77 | Resp 18 | Ht 71.0 in | Wt 268.0 lb

## 2023-03-05 DIAGNOSIS — I890 Lymphedema, not elsewhere classified: Secondary | ICD-10-CM | POA: Diagnosis not present

## 2023-03-05 DIAGNOSIS — I10 Essential (primary) hypertension: Secondary | ICD-10-CM

## 2023-03-05 DIAGNOSIS — E119 Type 2 diabetes mellitus without complications: Secondary | ICD-10-CM | POA: Diagnosis not present

## 2023-03-05 NOTE — Progress Notes (Signed)
Subjective:    Patient ID: Brad Graham, male    DOB: 27-Feb-1961, 62 y.o.   MRN: [id number] Chief Complaint  Patient presents with   Follow-up    f/u in 3 months with no studies    The patient returns to the office for followup evaluation regarding leg swelling.  The swelling has improved quite a bit and the pain associated with swelling has decreased substantially. There have not been any interval development of a ulcerations or wounds.  Since the previous visit the patient has been wearing graduated compression stockings and has noted some improvement in the lymphedema. The patient has been using compression routinely morning until night.  The patient also states elevation during the day and exercise (such as walking) is being done too.       Review of Systems  Cardiovascular:  Positive for leg swelling.  Skin:  Negative for wound.  All other systems reviewed and are negative.      Objective:   Physical Exam Vitals reviewed.  HENT:     Head: Normocephalic.  Cardiovascular:     Rate and Rhythm: Normal rate.  Pulmonary:     Effort: Pulmonary effort is normal.  Musculoskeletal:     Right lower leg: Edema present.     Left lower leg: Edema present.  Skin:    General: Skin is warm and dry.  Neurological:     Mental Status: He is alert and oriented to person, place, and time.  Psychiatric:        Mood and Affect: Mood normal.        Behavior: Behavior normal.        Thought Content: Thought content normal.        Judgment: Judgment normal.     BP 119/79 (BP Location: Left Arm)   Pulse 77   Resp 18   Ht 5\' 11"  (1.803 m)   Wt 268 lb (121.6 kg)   BMI 37.38 kg/m   Past Medical History:  Diagnosis Date   Alcohol abuse    Alcohol abuse, continuous    Arthritis    Centrencephalic epilepsy ([facility])    Diabetes mellitus without complication ([facility])    H/O: CVA (cerebrovascular accident)    Hemiparesis affecting left side as late effect of cerebrovascular accident  ([facility])    Hyperlipidemia    Hypertension    Syncope 07/02/2016   Syncope and collapse 01/27/2020    Social History   Socioeconomic History   Marital status: Single    Spouse name: Not on file   Number of children: Not on file   Years of education: Not on file   Highest education level: Not on file  Occupational History   Not on file  Tobacco Use   Smoking status: Former    Packs/day: .5    Types: Cigarettes   Smokeless tobacco: Never  Vaping Use   Vaping Use: Never used  Substance and Sexual Activity   Alcohol use: Not Currently    Comment: fifth every other day   Drug use: No   Sexual activity: Never  Other Topics Concern   Not on file  Social History Narrative   Not on file   Social Determinants of Health   Financial Resource Strain: Not on file  Food Insecurity: Not on file  Transportation Needs: Not on file  Physical Activity: Not on file  Stress: Not on file  Social Connections: Not on file  Intimate Partner Violence: Not on file  Past Surgical History:  Procedure Laterality Date   none      Family History  Problem Relation Age of Onset   Diabetes Mother    Stroke Mother    Heart disease Maternal Grandmother    Heart disease Maternal Grandfather     No Known Allergies     Latest Ref Rng & Units 07/12/2020    9:27 PM 05/10/2020   12:57 PM 04/11/2020   10:38 AM  CBC  WBC 4.0 - 10.5 K/uL 3.3  3.3  4.5   Hemoglobin 13.0 - 17.0 g/dL 28.4  13.2  8.9   Hematocrit 39.0 - 52.0 % 39.1  33.5  28.8   Platelets 150 - 400 K/uL 215  207  403       CMP     Component Value Date/Time   NA 131 (L) 07/12/2020 2127   NA 137 02/16/2020 1426   NA 126 (L) 05/02/2014 0921   K 4.2 07/12/2020 2127   K 3.6 05/02/2014 0921   CL 94 (L) 07/12/2020 2127   CL 93 (L) 05/02/2014 0921   CO2 28 07/12/2020 2127   CO2 21 05/02/2014 0921   GLUCOSE 113 (H) 07/12/2020 2127   GLUCOSE 106 (H) 05/02/2014 0921   BUN 20 07/12/2020 2127   BUN 28 (H) 02/16/2020 1426   BUN 19  (H) 05/02/2014 0921   CREATININE 0.95 07/12/2020 2127   CREATININE 1.22 05/02/2014 0921   CALCIUM 9.6 07/12/2020 2127   CALCIUM 8.5 05/02/2014 0921   PROT 8.4 (H) 07/12/2020 2127   PROT 8.2 02/16/2020 1426   PROT 8.3 (H) 05/02/2014 0134   ALBUMIN 4.3 07/12/2020 2127   ALBUMIN 4.6 02/16/2020 1426   ALBUMIN 3.8 05/02/2014 0134   AST 25 07/12/2020 2127   AST 39 (H) 08/15/2015 1314   AST 30 05/02/2014 0134   ALT 23 07/12/2020 2127   ALT 20 08/15/2015 1314   ALT 26 05/02/2014 0134   ALKPHOS 151 (H) 07/12/2020 2127   ALKPHOS 102 05/02/2014 0134   BILITOT 0.4 07/12/2020 2127   BILITOT 0.6 02/16/2020 1426   BILITOT 0.3 05/02/2014 0134   GFRNONAA >60 07/12/2020 2127   GFRNONAA >60 05/02/2014 0921     No results found.     Assessment & Plan:   1. Lymphedema Recommend:  No surgery or intervention at this point in time.    I have reviewed my previous discussion with the patient regarding swelling and why it  causes symptoms.  The patient is doing well with compression and will continue wearing graduated compression on a daily basis. The patient will  continue wearing the compression first thing in the morning and removing them in the evening. The patient is instructed specifically not to sleep in the compression.    In addition, behavioral modification including elevation during the day and exercise as tolerated will be continued.    Patient should follow-up on 6 months  2. Diabetes mellitus without complication ([facility]) Continue hypoglycemic medications as already ordered, these medications have been reviewed and there are no changes at this time.  Hgb A1C to be monitored as already arranged by primary service  3. Essential hypertension Continue antihypertensive medications as already ordered, these medications have been reviewed and there are no changes at this time.   Current Outpatient Medications on File Prior to Visit  Medication Sig Dispense Refill   amLODipine (NORVASC)  10 MG tablet Take 10 mg by mouth daily.     atorvastatin (LIPITOR) 80 MG tablet  Take 1 tablet (80 mg total) by mouth daily at 6 PM. 30 tablet 0   carvedilol (COREG) 25 MG tablet Take 1 tablet (25 mg total) by mouth 2 (two) times daily with a meal. 60 tablet 0   Cholecalciferol (VITAMIN D) 50 MCG (2000 UT) CAPS Take 1 capsule by mouth every morning.     cloNIDine (CATAPRES) 0.1 MG tablet Take 0.1 mg by mouth daily.     clopidogrel (PLAVIX) 75 MG tablet Take 75 mg by mouth daily.     folic acid (FOLVITE) 1 MG tablet Take 1 tablet (1 mg total) by mouth daily. 30 tablet 0   furosemide (LASIX) 40 MG tablet Take 40 mg by mouth.     losartan (COZAAR) 25 MG tablet Take 25 mg by mouth daily.     magnesium oxide (MAG-OX) 400 (241.3 Mg) MG tablet Take 1 tablet (400 mg total) by mouth 2 (two) times daily. 60 tablet 0   melatonin 3 MG TABS tablet Take 3 mg by mouth.     metFORMIN (GLUCOPHAGE) 1000 MG tablet Take 1 tablet (1,000 mg total) by mouth daily with breakfast. 30 tablet 0   Multiple Vitamin (ONE-DAILY MULTI-VITAMIN) TABS Take 1 tablet by mouth daily.     pantoprazole (PROTONIX) 40 MG tablet Take 1 tablet (40 mg total) by mouth daily. 30 tablet 0   phenytoin (DILANTIN) 200 MG ER capsule Take 1 capsule (200 mg total) by mouth daily. 30 capsule 0   senna-docusate (SENOKOT-S) 8.6-50 MG tablet Take 2 tablets by mouth at bedtime. 30 tablet 0   sertraline (ZOLOFT) 50 MG tablet Take 50 mg by mouth daily.     Skin Protectants, Misc. (MINERIN CREME EX) Apply 1 application topically every 12 (twelve) hours as needed.     sodium chloride 1 g tablet Take 1 g by mouth 2 (two) times daily with a meal.     tamsulosin (FLOMAX) 0.4 MG CAPS capsule Take 1 capsule (0.4 mg total) by mouth daily after supper. 30 capsule 0   thiamine 100 MG tablet Take 1 tablet (100 mg total) by mouth daily. 30 tablet 0   OXYGEN Inhale 2 L into the lungs daily as needed. (Patient not taking: Reported on 03/05/2023)     phenytoin (DILANTIN)  300 MG ER capsule Take 1 capsule (300 mg total) by mouth at bedtime. 30 capsule 0   No current facility-administered medications on file prior to visit.    There are no Patient Instructions on file for this visit. No follow-ups on file.   Georgiana Spinner, NP

## 2023-06-17 ENCOUNTER — Encounter: Payer: Self-pay | Admitting: Cardiology

## 2023-06-17 ENCOUNTER — Other Ambulatory Visit
Admission: RE | Admit: 2023-06-17 | Discharge: 2023-06-17 | Disposition: A | Discharge status: Home / Self Care | Payer: Medicaid Other | Source: Ambulatory Visit | Attending: Cardiology | Admitting: Cardiology

## 2023-06-17 ENCOUNTER — Ambulatory Visit (HOSPITAL_BASED_OUTPATIENT_CLINIC_OR_DEPARTMENT_OTHER): Payer: Medicaid Other | Admitting: Cardiology

## 2023-06-17 VITALS — BP 159/83 | HR 84 | Wt 272.0 lb

## 2023-06-17 DIAGNOSIS — E119 Type 2 diabetes mellitus without complications: Secondary | ICD-10-CM | POA: Diagnosis not present

## 2023-06-17 DIAGNOSIS — I10 Essential (primary) hypertension: Secondary | ICD-10-CM

## 2023-06-17 DIAGNOSIS — I5033 Acute on chronic diastolic (congestive) heart failure: Secondary | ICD-10-CM

## 2023-06-17 LAB — BRAIN NATRIURETIC PEPTIDE: B Natriuretic Peptide: 48 pg/mL (ref 0.0–100.0)

## 2023-06-17 LAB — BASIC METABOLIC PANEL
Anion gap: 8 (ref 5–15)
BUN: 18 mg/dL (ref 8–23)
CO2: 26 mmol/L (ref 22–32)
Calcium: 9.2 mg/dL (ref 8.9–10.3)
Chloride: 101 mmol/L (ref 98–111)
Creatinine, Ser: 1.12 mg/dL (ref 0.61–1.24)
GFR, Estimated: >60 mL/min (ref >60)
Glucose, Bld: 83 mg/dL (ref 70–99)
Potassium: 4.3 mmol/L (ref 3.5–5.1)
Sodium: 135 mmol/L (ref 135–145)

## 2023-06-17 LAB — HEMOGLOBIN A1C
Hgb A1c MFr Bld: 6.8 % — ABNORMAL HIGH (ref 4.8–5.6)
Mean Plasma Glucose: 148.46 mg/dL

## 2023-06-17 NOTE — Patient Instructions (Addendum)
Please call [phone] to schedule your Echo (Ultrasound of your heart).  Go over to the [facility]. Go pass the gift shop and have your blood work completed today.  Have your echo completed. You will check in at the medical mall for this. Please arrive 15 mins before your appt time for preparation.  Please continue to follow up with general cardiology.

## 2023-06-17 NOTE — Progress Notes (Signed)
ADVANCED HEART FAILURE CLINIC NOTE  Referring Physician: No ref. provider found  Primary Care: Loura Pardon, MD (Inactive)   HPI: Brad Graham is a 62 y.o. male with heart failure with preserved ejection fraction, history of cardiac arrest, history of stroke with chronic left hemiparesis, history of chronic alcohol abuse with prior admissions for alcohol withdrawal, chronic seizure disorder, type 2 diabetes and hypertension presenting today to establish care.  [name] currently resides at the [facility] living facility.  Unfortunately he is not able to provide me with any of his medical history.  He was diagnosed with HFpEF in 2021.  Since that time he has not seen a cardiologist and reports doing very well from a functional standpoint.  He reports having no shortness of breath, PND, orthopnea or lower extremity edema.  He believes he was sent here for uncontrolled hypertension.  Unable to provide me with medication list.  Activity level/exercise tolerance: NYHA II Orthopnea:  Sleeps on 1 pillows Paroxysmal noctural dyspnea:  no Chest pain/pressure:  no Orthostatic lightheadedness:  no Palpitations:  no Lower extremity edema:  no Presyncope/syncope:  no Cough:  no  Past Medical History:  Diagnosis Date   Alcohol abuse    Alcohol abuse, continuous    Arthritis    Centrencephalic epilepsy (HCC)    Diabetes mellitus without complication (HCC)    H/O: CVA (cerebrovascular accident)    Hemiparesis affecting left side as late effect of cerebrovascular accident (HCC)    Hyperlipidemia    Hypertension    Syncope 07/02/2016   Syncope and collapse 01/27/2020    Current Outpatient Medications  Medication Sig Dispense Refill   amLODipine (NORVASC) 10 MG tablet Take 10 mg by mouth daily.     atorvastatin (LIPITOR) 80 MG tablet Take 1 tablet (80 mg total) by mouth daily at 6 PM. 30 tablet 0   carvedilol (COREG) 25 MG tablet Take 1 tablet (25 mg total) by mouth 2 (two) times daily  with a meal. 60 tablet 0   Cholecalciferol (VITAMIN D) 50 MCG (2000 UT) CAPS Take 1 capsule by mouth every morning.     cloNIDine (CATAPRES) 0.1 MG tablet Take 0.1 mg by mouth daily.     clopidogrel (PLAVIX) 75 MG tablet Take 75 mg by mouth daily.     folic acid (FOLVITE) 1 MG tablet Take 1 tablet (1 mg total) by mouth daily. 30 tablet 0   furosemide (LASIX) 40 MG tablet Take 40 mg by mouth.     losartan (COZAAR) 25 MG tablet Take 25 mg by mouth daily.     magnesium oxide (MAG-OX) 400 (241.3 Mg) MG tablet Take 1 tablet (400 mg total) by mouth 2 (two) times daily. 60 tablet 0   melatonin 3 MG TABS tablet Take 3 mg by mouth.     metFORMIN (GLUCOPHAGE) 1000 MG tablet Take 1 tablet (1,000 mg total) by mouth daily with breakfast. 30 tablet 0   Multiple Vitamin (ONE-DAILY MULTI-VITAMIN) TABS Take 1 tablet by mouth daily.     OXYGEN Inhale 2 L into the lungs daily as needed. (Patient not taking: Reported on 03/05/2023)     pantoprazole (PROTONIX) 40 MG tablet Take 1 tablet (40 mg total) by mouth daily. 30 tablet 0   phenytoin (DILANTIN) 200 MG ER capsule Take 1 capsule (200 mg total) by mouth daily. 30 capsule 0   phenytoin (DILANTIN) 300 MG ER capsule Take 1 capsule (300 mg total) by mouth at bedtime. 30 capsule 0  senna-docusate (SENOKOT-S) 8.6-50 MG tablet Take 2 tablets by mouth at bedtime. 30 tablet 0   sertraline (ZOLOFT) 50 MG tablet Take 50 mg by mouth daily.     Skin Protectants, Misc. (MINERIN CREME EX) Apply 1 application topically every 12 (twelve) hours as needed.     sodium chloride 1 g tablet Take 1 g by mouth 2 (two) times daily with a meal.     tamsulosin (FLOMAX) 0.4 MG CAPS capsule Take 1 capsule (0.4 mg total) by mouth daily after supper. 30 capsule 0   thiamine 100 MG tablet Take 1 tablet (100 mg total) by mouth daily. 30 tablet 0   No current facility-administered medications for this visit.    No Known Allergies    Social History   Socioeconomic History   Marital  status: Single    Spouse name: Not on file   Number of children: Not on file   Years of education: Not on file   Highest education level: Not on file  Occupational History   Not on file  Tobacco Use   Smoking status: Former    Current packs/day: 0.50    Types: Cigarettes   Smokeless tobacco: Never  Vaping Use   Vaping status: Never Used  Substance and Sexual Activity   Alcohol use: Not Currently    Comment: fifth every other day   Drug use: No   Sexual activity: Never  Other Topics Concern   Not on file  Social History Narrative   Not on file   Social Determinants of Health   Financial Resource Strain: Not on file  Food Insecurity: Not on file  Transportation Needs: Not on file  Physical Activity: Not on file  Stress: Not on file  Social Connections: Not on file  Intimate Partner Violence: Not on file      Family History  Problem Relation Age of Onset   Diabetes Mother    Stroke Mother    Heart disease Maternal Grandmother    Heart disease Maternal Grandfather     PHYSICAL EXAM: Vitals:   06/17/23 1317  BP: (!) 159/83  Pulse: 84  SpO2: 99%   GENERAL: Well nourished, well developed, and in no apparent distress at rest.  HEENT: Negative for arcus senilis or xanthelasma. There is no scleral icterus.  The mucous membranes are pink and moist.   NECK: Supple, No masses. Normal carotid upstrokes without bruits. No masses or thyromegaly.    CHEST: There are no chest wall deformities. There is no chest wall tenderness. Respirations are unlabored.  Lungs- CTA B/L CARDIAC:  JVP: 5 cm H2O         Normal S1, S2  Normal rate with regular rhythm. No murmurs, rubs or gallops.  Pulses are 2+ and symmetrical in upper and lower extremities. No edema.  ABDOMEN: Soft, non-tender, non-distended. There are no masses or hepatomegaly. There are normal bowel sounds.  EXTREMITIES: Warm and well perfused with no cyanosis, clubbing.  LYMPHATIC: No axillary or supraclavicular  lymphadenopathy.  NEUROLOGIC: Patient is oriented x3 with no focal or lateralizing neurologic deficits.  PSYCH: Patients affect is appropriate, there is no evidence of anxiety or depression.  SKIN: Warm and dry; no lesions or wounds.   DATA REVIEW  ECG: 06/17/23: NSR  As per my personal interpretation  ECHO: 04/09/20: LVEF 70-75%; indeterminate diastology  02/27/20: LVEF 55%-60% 01/28/20: LVEF 50-55% 07/03/16: LVEF 6%%, grade II DD.   ASSESSMENT & PLAN:  HFpEF - Will repeat TTE; obtain BNP/BMP today.  On exam he is euvolemic. No hospitalizations for heart failure or overt signs of HFpEF.  - spironolactone 25mg  daily - Repeat labs today including BMP, BNP, A1C. Start jardiance 10mg  daily pending lab work.   2. Hypertension - BP 159/83 today. I reviewed BP log from facility; SBP ranges from 120-145.  - Amlodipine 10mg  daily, coreg 25mg  BID, losartan 100mg  daily (recently increased), spironolactone 25mg  daily - Repeat labs today. Will start chlorthalidone pending lab work.  - Refer to HTN clinic. If BP remain elevated at facility will need work up for resistant hypertension as he is on 4 drug therapy.   3. Seizure disorder - Phenytoin 100mg    4. Hx of stroke  - Acute infarct of the left frontal gyrus in 7/21 - Plavix 75mg  daily.  - continue high intensity statin.    Azriella Mattia Advanced Heart Failure Mechanical Circulatory Support

## 2023-07-02 ENCOUNTER — Other Ambulatory Visit: Payer: Self-pay | Admitting: Cardiology

## 2023-07-02 DIAGNOSIS — I10 Essential (primary) hypertension: Secondary | ICD-10-CM

## 2023-07-02 DIAGNOSIS — I5033 Acute on chronic diastolic (congestive) heart failure: Secondary | ICD-10-CM

## 2023-07-02 DIAGNOSIS — E119 Type 2 diabetes mellitus without complications: Secondary | ICD-10-CM

## 2023-07-11 ENCOUNTER — Ambulatory Visit: Payer: Medicaid Other | Attending: Cardiology

## 2023-07-11 DIAGNOSIS — I5033 Acute on chronic diastolic (congestive) heart failure: Secondary | ICD-10-CM | POA: Insufficient documentation

## 2023-07-11 LAB — ECHOCARDIOGRAM COMPLETE
AR max vel: 2.98 cm2
AV Area VTI: 2.71 cm2
AV Area mean vel: 2.87 cm2
AV Mean grad: 3 mm[Hg]
AV Peak grad: 4.8 mm[Hg]
Ao pk vel: 1.1 m/s
Area-P 1/2: 5.27 cm2
S' Lateral: 3.5 cm

## 2023-07-11 MED ORDER — PERFLUTREN LIPID MICROSPHERE
1.0000 mL | INTRAVENOUS | Status: AC | PRN
  Administered 2023-07-11: 2 mL via INTRAVENOUS

## 2023-09-03 ENCOUNTER — Encounter (INDEPENDENT_AMBULATORY_CARE_PROVIDER_SITE_OTHER): Payer: Self-pay | Admitting: Nurse Practitioner

## 2023-09-03 ENCOUNTER — Ambulatory Visit (INDEPENDENT_AMBULATORY_CARE_PROVIDER_SITE_OTHER): Payer: Medicaid Other | Admitting: Nurse Practitioner

## 2023-09-03 VITALS — BP 142/90 | HR 80 | Resp 16 | Wt 266.4 lb

## 2023-09-03 DIAGNOSIS — I890 Lymphedema, not elsewhere classified: Secondary | ICD-10-CM | POA: Diagnosis not present

## 2023-09-03 DIAGNOSIS — I10 Essential (primary) hypertension: Secondary | ICD-10-CM

## 2023-09-03 DIAGNOSIS — E119 Type 2 diabetes mellitus without complications: Secondary | ICD-10-CM

## 2023-09-03 NOTE — Progress Notes (Signed)
Subjective:    Patient ID: Brad Graham, male    DOB: 23-Sep-1961, 62 y.o.   MRN: [id number] Chief Complaint  Patient presents with   Follow-up    6 month follow up    The patient returns to the office for followup evaluation regarding leg swelling.  The swelling has improved quite a bit and the pain associated with swelling has decreased substantially.  He has a small scabbed over area on his left lower extremity but no development of any ulcerations.  Since the previous visit the patient has been wearing graduated compression stockings and has noted some improvement in the lymphedema. The patient has been using compression routinely morning until night.  The patient also states elevation during the day and exercise (such as walking) is being done too.       Review of Systems  Cardiovascular:  Positive for leg swelling.  Skin:  Negative for wound.  All other systems reviewed and are negative.      Objective:   Physical Exam Vitals reviewed.  HENT:     Head: Normocephalic.  Cardiovascular:     Rate and Rhythm: Normal rate.  Pulmonary:     Effort: Pulmonary effort is normal.  Musculoskeletal:     Right lower leg: Edema present.     Left lower leg: Edema present.  Skin:    General: Skin is warm and dry.  Neurological:     Mental Status: He is alert and oriented to person, place, and time.  Psychiatric:        Mood and Affect: Mood normal.        Behavior: Behavior normal.        Thought Content: Thought content normal.        Judgment: Judgment normal.     BP (!) 142/90 (BP Location: Right Arm)   Pulse 80   Resp 16   Wt 266 lb 6.4 oz (120.8 kg)   BMI 37.16 kg/m   Past Medical History:  Diagnosis Date   Alcohol abuse    Alcohol abuse, continuous    Arthritis    Centrencephalic epilepsy (HCC)    Diabetes mellitus without complication (HCC)    H/O: CVA (cerebrovascular accident)    Hemiparesis affecting left side as late effect of cerebrovascular  accident (HCC)    Hyperlipidemia    Hypertension    Syncope 07/02/2016   Syncope and collapse 01/27/2020    Social History   Socioeconomic History   Marital status: Single    Spouse name: Not on file   Number of children: Not on file   Years of education: Not on file   Highest education level: Not on file  Occupational History   Not on file  Tobacco Use   Smoking status: Former    Current packs/day: 0.50    Types: Cigarettes   Smokeless tobacco: Never  Vaping Use   Vaping status: Never Used  Substance and Sexual Activity   Alcohol use: Not Currently    Comment: fifth every other day   Drug use: No   Sexual activity: Never  Other Topics Concern   Not on file  Social History Narrative   Not on file   Social Determinants of Health   Financial Resource Strain: Not on file  Food Insecurity: Not on file  Transportation Needs: Not on file  Physical Activity: Not on file  Stress: Not on file  Social Connections: Not on file  Intimate Partner Violence: Not on file  Past Surgical History:  Procedure Laterality Date   none      Family History  Problem Relation Age of Onset   Diabetes Mother    Stroke Mother    Heart disease Maternal Grandmother    Heart disease Maternal Grandfather     No Known Allergies     Latest Ref Rng & Units 07/12/2020    9:27 PM 05/10/2020   12:57 PM 04/11/2020   10:38 AM  CBC  WBC 4.0 - 10.5 K/uL 3.3  3.3  4.5   Hemoglobin 13.0 - 17.0 g/dL 40.9  81.1  8.9   Hematocrit 39.0 - 52.0 % 39.1  33.5  28.8   Platelets 150 - 400 K/uL 215  207  403       CMP     Component Value Date/Time   NA 135 06/17/2023 1457   NA 137 02/16/2020 1426   NA 126 (L) 05/02/2014 0921   K 4.3 06/17/2023 1457   K 3.6 05/02/2014 0921   CL 101 06/17/2023 1457   CL 93 (L) 05/02/2014 0921   CO2 26 06/17/2023 1457   CO2 21 05/02/2014 0921   GLUCOSE 83 06/17/2023 1457   GLUCOSE 106 (H) 05/02/2014 0921   BUN 18 06/17/2023 1457   BUN 28 (H) 02/16/2020 1426    BUN 19 (H) 05/02/2014 0921   CREATININE 1.12 06/17/2023 1457   CREATININE 1.22 05/02/2014 0921   CALCIUM 9.2 06/17/2023 1457   CALCIUM 8.5 05/02/2014 0921   PROT 8.4 (H) 07/12/2020 2127   PROT 8.2 02/16/2020 1426   PROT 8.3 (H) 05/02/2014 0134   ALBUMIN 4.3 07/12/2020 2127   ALBUMIN 4.6 02/16/2020 1426   ALBUMIN 3.8 05/02/2014 0134   AST 25 07/12/2020 2127   AST 39 (H) 08/15/2015 1314   AST 30 05/02/2014 0134   ALT 23 07/12/2020 2127   ALT 20 08/15/2015 1314   ALT 26 05/02/2014 0134   ALKPHOS 151 (H) 07/12/2020 2127   ALKPHOS 102 05/02/2014 0134   BILITOT 0.4 07/12/2020 2127   BILITOT 0.6 02/16/2020 1426   BILITOT 0.3 05/02/2014 0134   GFRNONAA >60 06/17/2023 1457   GFRNONAA >60 05/02/2014 0921     No results found.     Assessment & Plan:   1. Lymphedema Recommend:  No surgery or intervention at this point in time.    I have reviewed my previous discussion with the patient regarding swelling and why it  causes symptoms.  The patient is doing well with compression and will continue wearing graduated compression on a daily basis. The patient will  continue wearing the compression first thing in the morning and removing them in the evening. The patient is instructed specifically not to sleep in the compression.    In addition, behavioral modification including elevation during the day and exercise as tolerated will be continued.    Patient should follow-up on 12 months  2. Diabetes mellitus without complication (HCC) Continue hypoglycemic medications as already ordered, these medications have been reviewed and there are no changes at this time.  Hgb A1C to be monitored as already arranged by primary service  3. Essential hypertension Continue antihypertensive medications as already ordered, these medications have been reviewed and there are no changes at this time.   Current Outpatient Medications on File Prior to Visit  Medication Sig Dispense Refill    acetaminophen (TYLENOL) 325 MG tablet Take 650 mg by mouth every 6 (six) hours as needed.     amLODipine (NORVASC) 10  MG tablet Take 10 mg by mouth daily.     atorvastatin (LIPITOR) 80 MG tablet Take 1 tablet (80 mg total) by mouth daily at 6 PM. 30 tablet 0   carvedilol (COREG) 25 MG tablet Take 1 tablet (25 mg total) by mouth 2 (two) times daily with a meal. 60 tablet 0   Cholecalciferol (VITAMIN D) 50 MCG (2000 UT) CAPS Take 1 capsule by mouth every morning.     cloNIDine (CATAPRES) 0.1 MG tablet Take 0.1 mg by mouth daily.     clopidogrel (PLAVIX) 75 MG tablet Take 75 mg by mouth daily.     famotidine (PEPCID) 20 MG tablet Take 20 mg by mouth daily.     folic acid (FOLVITE) 1 MG tablet Take 1 tablet (1 mg total) by mouth daily. 30 tablet 0   furosemide (LASIX) 40 MG tablet Take 40 mg by mouth.     hydrALAZINE (APRESOLINE) 25 MG tablet Take 25 mg by mouth 4 (four) times daily.     losartan (COZAAR) 25 MG tablet Take 25 mg by mouth daily.     magnesium oxide (MAG-OX) 400 (241.3 Mg) MG tablet Take 1 tablet (400 mg total) by mouth 2 (two) times daily. 60 tablet 0   melatonin 3 MG TABS tablet Take 3 mg by mouth.     metFORMIN (GLUCOPHAGE) 1000 MG tablet Take 1 tablet (1,000 mg total) by mouth daily with breakfast. 30 tablet 0   Multiple Vitamin (ONE-DAILY MULTI-VITAMIN) TABS Take 1 tablet by mouth daily.     pantoprazole (PROTONIX) 40 MG tablet Take 1 tablet (40 mg total) by mouth daily. 30 tablet 0   phenytoin (DILANTIN) 200 MG ER capsule Take 1 capsule (200 mg total) by mouth daily. 30 capsule 0   senna-docusate (SENOKOT-S) 8.6-50 MG tablet Take 2 tablets by mouth at bedtime. 30 tablet 0   sertraline (ZOLOFT) 50 MG tablet Take 50 mg by mouth daily.     Skin Protectants, Misc. (MINERIN CREME EX) Apply 1 application topically every 12 (twelve) hours as needed.     sodium chloride 1 g tablet Take 1 g by mouth 2 (two) times daily with a meal.     spironolactone (ALDACTONE) 25 MG tablet Take 12.5  mg by mouth daily.     tamsulosin (FLOMAX) 0.4 MG CAPS capsule Take 1 capsule (0.4 mg total) by mouth daily after supper. 30 capsule 0   thiamine 100 MG tablet Take 1 tablet (100 mg total) by mouth daily. 30 tablet 0   OXYGEN Inhale 2 L into the lungs daily as needed. (Patient not taking: Reported on 03/05/2023)     phenytoin (DILANTIN) 300 MG ER capsule Take 1 capsule (300 mg total) by mouth at bedtime. 30 capsule 0   No current facility-administered medications on file prior to visit.    There are no Patient Instructions on file for this visit. No follow-ups on file.   Georgiana Spinner, NP

## 2023-12-09 ENCOUNTER — Encounter: Payer: Self-pay | Admitting: Podiatry

## 2023-12-09 ENCOUNTER — Ambulatory Visit (INDEPENDENT_AMBULATORY_CARE_PROVIDER_SITE_OTHER): Payer: MEDICAID | Admitting: Podiatry

## 2023-12-09 VITALS — Ht 71.0 in | Wt 266.4 lb

## 2023-12-09 DIAGNOSIS — B351 Tinea unguium: Secondary | ICD-10-CM

## 2023-12-09 DIAGNOSIS — M2142 Flat foot [pes planus] (acquired), left foot: Secondary | ICD-10-CM

## 2023-12-09 DIAGNOSIS — S90819A Abrasion, unspecified foot, initial encounter: Secondary | ICD-10-CM

## 2023-12-09 DIAGNOSIS — M2141 Flat foot [pes planus] (acquired), right foot: Secondary | ICD-10-CM | POA: Diagnosis not present

## 2023-12-09 DIAGNOSIS — E119 Type 2 diabetes mellitus without complications: Secondary | ICD-10-CM

## 2023-12-09 DIAGNOSIS — M79674 Pain in right toe(s): Secondary | ICD-10-CM

## 2023-12-09 DIAGNOSIS — M79675 Pain in left toe(s): Secondary | ICD-10-CM | POA: Diagnosis not present

## 2023-12-09 NOTE — Progress Notes (Signed)
 Subjective: Chief Complaint  Patient presents with   Nail Problem    Pt is here for [facility] unsure of last A1C PCP is Dr Manson Passey and LOV was in January.   Brad Graham presents today with chief concern of diabetes with elongated, thickened, painful, discolored toenails for months. Aggravating factor(s) include wearing enclosed shoe gear. Patient has attempted no treatment. Patient has h/o venous stasis ulcerations. He has abrasions on top of left foot and was unaware. He has h/o lymphedema b/l.  He is a resident of [facility].  Patient relates h/o diabetes.  Past Medical History:  Diagnosis Date   Alcohol abuse    Alcohol abuse, continuous    Arthritis    Centrencephalic epilepsy (HCC)    Diabetes mellitus without complication (HCC)    H/O: CVA (cerebrovascular accident)    Hemiparesis affecting left side as late effect of cerebrovascular accident (HCC)    Hyperlipidemia    Hypertension    Syncope 07/02/2016   Syncope and collapse 01/27/2020    Patient Active Problem List   Diagnosis Date Noted   Cerebral thrombosis with cerebral infarction 04/10/2020   Acute on chronic diastolic CHF (congestive heart failure) (HCC) 04/06/2020   Hyponatremia 03/30/2020   Volume overload 03/30/2020   UTI (urinary tract infection) 03/30/2020   DNR (do not resuscitate) 03/30/2020   Alcohol abuse    Centrencephalic epilepsy (HCC)    Hemiparesis affecting left side as late effect of cerebrovascular accident    Bilateral leg edema    Sinus tachycardia    Hyperkalemia 03/03/2020   Metabolic acidosis 03/03/2020   Acute encephalopathy 03/03/2020   Cardiogenic shock (HCC) 03/03/2020   Hyperglycemia 03/03/2020   Acute renal failure (HCC)    Goals of care, counseling/discussion    Palliative care by specialist    History of Cardiopulmonary arrest  03/02/2020   Unresponsive episode 01/28/2020   Closed fracture of proximal end of left humerus 08/06/2016   Seizure (HCC) 08/02/2016    Essential hypertension 05/10/2015   Diabetes mellitus without complication (HCC) 05/10/2015   Hyperlipidemia 05/10/2015   Proteinuria 05/10/2015    Past Surgical History:  Procedure Laterality Date   none      Current Outpatient Medications on File Prior to Visit  Medication Sig Dispense Refill   acetaminophen (TYLENOL) 325 MG tablet Take 650 mg by mouth every 6 (six) hours as needed.     amLODipine (NORVASC) 10 MG tablet Take 10 mg by mouth daily.     atorvastatin (LIPITOR) 80 MG tablet Take 1 tablet (80 mg total) by mouth daily at 6 PM. 30 tablet 0   carvedilol (COREG) 25 MG tablet Take 1 tablet (25 mg total) by mouth 2 (two) times daily with a meal. 60 tablet 0   Cholecalciferol (VITAMIN D) 50 MCG (2000 UT) CAPS Take 1 capsule by mouth every morning.     cloNIDine (CATAPRES) 0.1 MG tablet Take 0.1 mg by mouth daily.     clopidogrel (PLAVIX) 75 MG tablet Take 75 mg by mouth daily.     famotidine (PEPCID) 20 MG tablet Take 20 mg by mouth daily.     folic acid (FOLVITE) 1 MG tablet Take 1 tablet (1 mg total) by mouth daily. 30 tablet 0   furosemide (LASIX) 40 MG tablet Take 40 mg by mouth.     hydrALAZINE (APRESOLINE) 25 MG tablet Take 25 mg by mouth 4 (four) times daily.     losartan (COZAAR) 25 MG tablet Take 25  mg by mouth daily.     magnesium oxide (MAG-OX) 400 (241.3 Mg) MG tablet Take 1 tablet (400 mg total) by mouth 2 (two) times daily. 60 tablet 0   melatonin 3 MG TABS tablet Take 3 mg by mouth.     metFORMIN (GLUCOPHAGE) 1000 MG tablet Take 1 tablet (1,000 mg total) by mouth daily with breakfast. 30 tablet 0   Multiple Vitamin (ONE-DAILY MULTI-VITAMIN) TABS Take 1 tablet by mouth daily.     pantoprazole (PROTONIX) 40 MG tablet Take 1 tablet (40 mg total) by mouth daily. 30 tablet 0   phenytoin (DILANTIN) 200 MG ER capsule Take 1 capsule (200 mg total) by mouth daily. 30 capsule 0   senna-docusate (SENOKOT-S) 8.6-50 MG tablet Take 2 tablets by mouth at bedtime. 30 tablet 0    sertraline (ZOLOFT) 50 MG tablet Take 50 mg by mouth daily.     Skin Protectants, Misc. (MINERIN CREME EX) Apply 1 application topically every 12 (twelve) hours as needed.     sodium chloride 1 g tablet Take 1 g by mouth 2 (two) times daily with a meal.     spironolactone (ALDACTONE) 25 MG tablet Take 12.5 mg by mouth daily.     tamsulosin (FLOMAX) 0.4 MG CAPS capsule Take 1 capsule (0.4 mg total) by mouth daily after supper. 30 capsule 0   thiamine 100 MG tablet Take 1 tablet (100 mg total) by mouth daily. 30 tablet 0   OXYGEN Inhale 2 L into the lungs daily as needed. (Patient not taking: Reported on 03/05/2023)     phenytoin (DILANTIN) 300 MG ER capsule Take 1 capsule (300 mg total) by mouth at bedtime. 30 capsule 0   No current facility-administered medications on file prior to visit.     No Known Allergies  Social History   Occupational History   Not on file  Tobacco Use   Smoking status: Former    Current packs/day: 0.50    Types: Cigarettes   Smokeless tobacco: Never  Vaping Use   Vaping status: Never Used  Substance and Sexual Activity   Alcohol use: Not Currently    Comment: fifth every other day   Drug use: No   Sexual activity: Never    Family History  Problem Relation Age of Onset   Diabetes Mother    Stroke Mother    Heart disease Maternal Grandmother    Heart disease Maternal Grandfather     Immunization History  Administered Date(s) Administered   Tdap 01/27/2020    Objective: There were no vitals filed for this visit.  Brad Graham is a pleasant 63 y.o. male in NAD. AAO X 3.             Title   Diabetic Foot Exam - detailed Date & Time: 12/09/2023  2:15 PM Diabetic Foot exam was performed with the following findings: Yes  Visual Foot Exam completed.: Yes  Is there a history of foot ulcer?: Yes Is there a foot ulcer now?: No Is there swelling?: Yes Is there elevated skin temperature?: No Is there abnormal foot shape?: Yes Is there  a claw toe deformity?: No Are the toenails long?: Yes Are the toenails thick?: Yes Are the toenails ingrown?: No Is the skin thin, fragile, shiny and hairless?": Yes Normal Range of Motion?: Yes Is there foot or ankle muscle weakness?: No Do you have pain in calf while walking?: No Are the shoes appropriate in style and fit?: Yes Can the patient see the  bottom of their feet?: Yes Pulse Foot Exam completed.: Yes   Right Posterior Tibialis: Present Left posterior Tibialis: Present   Right Dorsalis Pedis: Present Left Dorsalis Pedis: Present     Sensory Foot Exam Completed.: Yes Semmes-Weinstein Monofilament Test "+" means "has sensation" and "-" means "no sensation"  R Foot Test Control: Pos L Foot Test Control: Pos   R Site 1-Great Toe: Pos L Site 1-Great Toe: Pos   R Site 4: Pos L Site 4: Pos   R site 5: Pos L Site 5: Pos  R Site 6: Pos L Site 6: Pos     Image components are not supported.   Image components are not supported. Image components are not supported.  T[place] Right vibratory: diminished Left vibratory: diminished  Comments Two skin tears noted dorsal aspect of left foot. No erythema, drainage, no odor.  Lymphedema b/l LE.     Uses rollator for mobility assistance.  Lab Results  Component Value Date   HGBA1C 6.8 (H) 06/17/2023   Assessment: 1. Pain due to onychomycosis of toenails of both feet   2. Abrasion of dorsum of foot   3. Pes planus of both feet   4. Diabetes mellitus without complication (HCC)   5. Encounter for diabetic foot exam (HCC)     ADA Risk Categorization: High Risk:  Patient has one or more of the following: Loss of protective sensation Absent pedal pulses Severe Foot deformity History of foot ulcer  Plan: -Patient was evaluated today. All questions/concerns addressed on today's visit. -Left foot abrasions cleansed with Puracyn Wound Cleanser. TAO applied. Apply Neosporin once daily until next follow up visit with Dr.  Lilian Kapur. -Diabetic foot examination performed today. -Patient to continue soft, supportive shoe gear daily. -Mycotic toenails 1-5 bilaterally were debrided in length and girth with sterile nail nippers and dremel without incident. -Patient scheduled to see Dr. Lilian Kapur in two weeks for follow up of abrasions dorsal aspect of left foot. -Patient/POA to call should there be question/concern in the interim. -Return in about 2 weeks (around 12/23/2023). Follow up with me in 3 months for diabetic foot care.  Freddie Breech, DPM      [facility] LOCATION: [address], [place] [address]                   Office [phone]   [facility] LOCATION: [address] [place], [place] [address] Office [phone]

## 2023-12-23 ENCOUNTER — Ambulatory Visit: Payer: MEDICAID | Admitting: Podiatry

## 2023-12-25 ENCOUNTER — Ambulatory Visit (INDEPENDENT_AMBULATORY_CARE_PROVIDER_SITE_OTHER): Payer: MEDICAID | Admitting: Podiatry

## 2023-12-25 ENCOUNTER — Encounter: Payer: Self-pay | Admitting: Podiatry

## 2023-12-25 VITALS — Ht 71.0 in | Wt 266.4 lb

## 2023-12-25 DIAGNOSIS — S90819A Abrasion, unspecified foot, initial encounter: Secondary | ICD-10-CM | POA: Diagnosis not present

## 2023-12-28 NOTE — Progress Notes (Signed)
  Subjective:  Patient ID: Brad Graham, male    DOB: 16-May-1961,  MRN: [id number]  Chief Complaint  Patient presents with   Diabetes    Pt is here due to spot on left foot, pt does not remember how long its been there or how it may have happen, states it doesn't hurt and bleeds it he scratches it. Pt is a diabetic.    63 y.o. male presents with the above complaint. History confirmed with patient. Has been applying neosporin  Objective:  Physical Exam: warm, good capillary refill, normal DP and PT pulses, and neuropathic, dorsal midfoot abrasion has healed  Assessment:   1. Abrasion of dorsum of foot      Plan:  Patient was evaluated and treated and all questions answered.   Ulcer healed w/o complication. Discussed risk of recurrence and importance of daily foot inspection. Return to see me PRN  Return if symptoms worsen or fail to improve.

## 2024-01-15 ENCOUNTER — Encounter: Payer: Self-pay | Admitting: Emergency Medicine

## 2024-03-13 ENCOUNTER — Ambulatory Visit (INDEPENDENT_AMBULATORY_CARE_PROVIDER_SITE_OTHER): Payer: MEDICAID | Admitting: Podiatry

## 2024-03-13 ENCOUNTER — Encounter: Payer: Self-pay | Admitting: Podiatry

## 2024-03-13 VITALS — Ht 71.0 in | Wt 266.4 lb

## 2024-03-13 DIAGNOSIS — E119 Type 2 diabetes mellitus without complications: Secondary | ICD-10-CM

## 2024-03-13 DIAGNOSIS — M79674 Pain in right toe(s): Secondary | ICD-10-CM

## 2024-03-13 DIAGNOSIS — B351 Tinea unguium: Secondary | ICD-10-CM

## 2024-03-13 DIAGNOSIS — M79675 Pain in left toe(s): Secondary | ICD-10-CM | POA: Diagnosis not present

## 2024-03-17 ENCOUNTER — Encounter: Payer: Self-pay | Admitting: Podiatry

## 2024-03-17 NOTE — Progress Notes (Signed)
  Subjective:  Patient ID: Brad Graham, male    DOB: 01-12-1961,  MRN: [id number]  Brad Graham presents to clinic today for preventative diabetic foot care and painful thick toenails that are difficult to trim. Pain interferes with ambulation. Aggravating factors include wearing enclosed shoe gear. Pain is relieved with periodic professional debridement.  Chief Complaint  Patient presents with   Nail Problem    Pt is here for [facility] last A1C was 6.8 PCP is Dr Delores and LOV was in January.   New problem(s): None.   PCP is Vigg, Chief Operating Officer, MD (Inactive).  No Known Allergies  Review of Systems: Negative except as noted in the HPI.  Objective: No changes noted in today's physical examination. There were no vitals filed for this visit. Brad Graham is a pleasant 63 y.o. male obese in NAD. AAO x 3.  Vascular Examination: CFT <4 seconds b/l LE. Palpable DP pulse(s) b/l LE. Palpable PT pulse(s) b/l LE. Pedal hair absent. Lymphedema present BLE. No ischemia or gangrene noted b/l LE. No cyanosis or clubbing noted b/l LE.  Neurological Examination: Protective sensation intact 5/5 intact bilaterally with 10g monofilament b/l. Vibratory sensation diminished b/l.  Dermatological Examination: Pedal skin with normal turgor, texture and tone b/l.  No open wounds. No interdigital macerations.   Toenails 1-5 b/l thick, discolored, elongated with subungual debris and pain on dorsal palpation.   No open wounds b/l LE. No interdigital macerations noted b/l LE. Toenails 1-5 bilaterally elongated, discolored, dystrophic, thickened, and crumbly with subungual debris and tenderness to dorsal palpation. No hyperkeratotic nor porokeratotic lesions present on today's visit.  Musculoskeletal Examination: Muscle strength 5/5 to all lower extremity muscle groups bilaterally. No pain, crepitus or joint limitation noted with ROM b/l LE. Pes planus deformity noted bilateral LE.Brad Graham Patient ambulates with rollator  assistance.  Radiographs: None   Last A1c:      Latest Ref Rng & Units 06/17/2023    2:57 PM  Hemoglobin A1C  Hemoglobin-A1c 4.8 - 5.6 % 6.8     Assessment/Plan: 1. Pain due to onychomycosis of toenails of both feet   2. Diabetes mellitus without complication (HCC)     -Consent given for treatment as described below: -Examined patient. -Continue foot and shoe inspections daily. Monitor blood glucose per PCP/Endocrinologist's recommendations. -Patient to continue soft, supportive shoe gear daily. -Toenails 1-5 b/l were debrided in length and girth with sterile nail nippers and dremel without iatrogenic bleeding.  -Patient/POA to call should there be question/concern in the interim.   Return in about 3 months (around 06/13/2024).  Brad Graham, DPM      [place] LOCATION: [address] [place] [address]                   Office [phone]   [facility] LOCATION: [address] [place], [place] [address] Office [phone]

## 2024-04-29 ENCOUNTER — Encounter: Payer: Self-pay | Admitting: Pediatrics

## 2024-04-29 ENCOUNTER — Ambulatory Visit: Admitting: Pediatrics

## 2024-04-29 VITALS — BP 124/76 | HR 76 | Temp 97.9°F | Ht 66.0 in | Wt 235.4 lb

## 2024-04-29 DIAGNOSIS — E119 Type 2 diabetes mellitus without complications: Secondary | ICD-10-CM

## 2024-04-29 DIAGNOSIS — I10 Essential (primary) hypertension: Secondary | ICD-10-CM | POA: Diagnosis not present

## 2024-04-29 DIAGNOSIS — Z7689 Persons encountering health services in other specified circumstances: Secondary | ICD-10-CM

## 2024-04-29 DIAGNOSIS — E782 Mixed hyperlipidemia: Secondary | ICD-10-CM | POA: Diagnosis not present

## 2024-04-29 DIAGNOSIS — Z1330 Encounter for screening examination for mental health and behavioral disorders, unspecified: Secondary | ICD-10-CM

## 2024-04-29 NOTE — Assessment & Plan Note (Signed)
 On statin, managed by group home physician.

## 2024-04-29 NOTE — Progress Notes (Signed)
 Establish Care Note  BP 124/76   Pulse 76   Temp 97.9 F (36.6 C) (Oral)   Ht 5' 6 (1.676 m)   Wt 235 lb 6.4 oz (106.8 kg)   SpO2 96%   BMI 37.99 kg/m    Subjective:    Patient ID: Brad Graham, male    DOB: 25-Oct-1960, 63 y.o.   MRN: [id number]  HPI: Brad Graham is a 63 y.o. male  Chief Complaint  Patient presents with   Establish Care    Establishing care, the following was discussed today:  Discussed the use of AI scribe software for clinical note transcription with the patient, who gave verbal consent to proceed.  History of Present Illness   Brad Graham is a 63 year old male with diabetes who presents for a follow-up regarding his care at a group home. He is accompanied by a caregiver from the group home.  He currently resides in a group home named '[facility]' after previously being at [facility]. He is in the process of transitioning to a new facility and requires an updated FL2 form for this move. His previous primary care doctor passed away during his stay at [facility], and he is now under the care of the group home's medical team.  His diabetes is monitored at the group home with blood sugar levels checked twice daily, in the morning and at night. There is uncertainty about the frequency of comprehensive lab work, as it has not been conducted since his time at [facility], approximately seven to eight months ago. The group home manages his medications and has been filling his prescriptions.  He has not experienced any recent blood draws, and there is a need to ensure that his diabetes management is consistent and effective.        Current Outpatient Medications on File Prior to Visit  Medication Sig Dispense Refill   amLODipine  (NORVASC ) 10 MG tablet Take 10 mg by mouth daily.     atorvastatin  (LIPITOR ) 80 MG tablet Take 1 tablet (80 mg total) by mouth daily at 6 PM. 30 tablet 0   carvedilol  (COREG ) 25 MG tablet Take 1 tablet (25 mg total)  by mouth 2 (two) times daily with a meal. 60 tablet 0   cloNIDine (CATAPRES) 0.1 MG tablet Take 0.1 mg by mouth daily.     clopidogrel  (PLAVIX ) 75 MG tablet Take 75 mg by mouth daily.     furosemide  (LASIX ) 40 MG tablet Take 40 mg by mouth.     hydrALAZINE  (APRESOLINE ) 25 MG tablet Take 25 mg by mouth 4 (four) times daily.     losartan (COZAAR) 25 MG tablet Take 25 mg by mouth daily.     magnesium  oxide (MAG-OX) 400 (241.3 Mg) MG tablet Take 1 tablet (400 mg total) by mouth 2 (two) times daily. 60 tablet 0   metFORMIN  (GLUCOPHAGE ) 1000 MG tablet Take 1 tablet (1,000 mg total) by mouth daily with breakfast. 30 tablet 0   OXYGEN Inhale 2 L into the lungs daily as needed.     phenytoin  (DILANTIN ) 200 MG ER capsule Take 1 capsule (200 mg total) by mouth daily. 30 capsule 0   senna-docusate (SENOKOT-S) 8.6-50 MG tablet Take 2 tablets by mouth at bedtime. 30 tablet 0   sertraline (ZOLOFT) 50 MG tablet Take 50 mg by mouth daily.     spironolactone (ALDACTONE) 25 MG tablet Take 12.5 mg by mouth daily.  acetaminophen  (TYLENOL ) 325 MG tablet Take 650 mg by mouth every 6 (six) hours as needed. (Patient not taking: Reported on 04/29/2024)     Cholecalciferol  (VITAMIN D ) 50 MCG (2000 UT) CAPS Take 1 capsule by mouth every morning. (Patient not taking: Reported on 04/29/2024)     famotidine  (PEPCID ) 20 MG tablet Take 20 mg by mouth daily.     folic acid  (FOLVITE ) 1 MG tablet Take 1 tablet (1 mg total) by mouth daily. 30 tablet 0   melatonin 3 MG TABS tablet Take 3 mg by mouth.     Multiple Vitamin (ONE-DAILY MULTI-VITAMIN) TABS Take 1 tablet by mouth daily.     pantoprazole  (PROTONIX ) 40 MG tablet Take 1 tablet (40 mg total) by mouth daily. 30 tablet 0   phenytoin  (DILANTIN ) 300 MG ER capsule Take 1 capsule (300 mg total) by mouth at bedtime. (Patient not taking: Reported on 04/29/2024) 30 capsule 0   Skin Protectants, Misc. (MINERIN CREME EX) Apply 1 application topically every 12 (twelve) hours as needed.  (Patient not taking: Reported on 04/29/2024)     sodium chloride  1 g tablet Take 1 g by mouth 2 (two) times daily with a meal. (Patient not taking: Reported on 04/29/2024)     tamsulosin  (FLOMAX ) 0.4 MG CAPS capsule Take 1 capsule (0.4 mg total) by mouth daily after supper. (Patient not taking: Reported on 04/29/2024) 30 capsule 0   thiamine  100 MG tablet Take 1 tablet (100 mg total) by mouth daily. (Patient not taking: Reported on 04/29/2024) 30 tablet 0   No current facility-administered medications on file prior to visit.    #HM Will review HM records and updated as needed.  Relevant past medical, surgical, family and social history reviewed and updated as indicated. Interim medical history since our last visit reviewed. Allergies and medications reviewed and updated.  ROS per HPI unless specifically indicated above     Objective:    BP 124/76   Pulse 76   Temp 97.9 F (36.6 C) (Oral)   Ht 5' 6 (1.676 m)   Wt 235 lb 6.4 oz (106.8 kg)   SpO2 96%   BMI 37.99 kg/m   Wt Readings from Last 3 Encounters:  04/29/24 235 lb 6.4 oz (106.8 kg)  03/13/24 266 lb 6.4 oz (120.8 kg)  12/25/23 266 lb 6.4 oz (120.8 kg)     Physical Exam Constitutional:      Appearance: Normal appearance.  Pulmonary:     Effort: Pulmonary effort is normal.  Musculoskeletal:        General: Normal range of motion.  Skin:    Comments: Normal skin color  Neurological:     General: No focal deficit present.     Mental Status: He is alert. Mental status is at baseline.  Psychiatric:        Mood and Affect: Mood normal.        Behavior: Behavior normal.        Thought Content: Thought content normal.         04/29/2024    9:54 AM 02/02/2020    2:38 PM 05/10/2015    3:39 PM  Depression screen PHQ 2/9  Decreased Interest 0 0 0  Down, Depressed, Hopeless 0 0 0  PHQ - 2 Score 0 0 0  Altered sleeping 0    Tired, decreased energy 0    Change in appetite 0    Feeling bad or failure about yourself  0     Trouble concentrating  1    Moving slowly or fidgety/restless 0    Suicidal thoughts 0    PHQ-9 Score 1    Difficult doing work/chores Not difficult at all          04/29/2024    9:55 AM  GAD 7 : Generalized Anxiety Score  Nervous, Anxious, on Edge 0  Control/stop worrying 0  Worry too much - different things 1  Trouble relaxing 0  Restless 0  Easily annoyed or irritable 0  Afraid - awful might happen 0  Total GAD 7 Score 1       Assessment & Plan:  Assessment & Plan   Essential hypertension Assessment & Plan: At goal, managed by doctor at group does not need refills.    Diabetes mellitus without complication [facility]) Assessment & Plan: Diabetes management at group home with concerns about care variability and missed labs. Per patient and friend, they are UTD on A1c ,lipid, microalbumin. Reports UTD on eye exam as well.  - Contact group home to confirm recent lab work and request records. - Send lab order if recent labs are missing.   Mixed hyperlipidemia Assessment & Plan: On statin, managed by group home physician.    Encounter to establish care Reviewed available patient record including history, medications, problem list. HM updated as able. Will review and/or request outside records (if applicable) and will fill remaining HM gaps as needed at follow up visit.  Encounter for behavioral health screening As part of their intake evaluation, the patient was screened for depression, anxiety.  PHQ9 SCORE 1, GAD7 SCORE 1. Screening results negative for tested conditions. See plan under problem/diagnosis above.    Follow up plan: No follow-ups on file.  Hadassah SHAUNNA Nett, MD

## 2024-04-29 NOTE — Progress Notes (Signed)
 This encounter was created in error - please disregard.  This encounter was created in error - please disregard.

## 2024-04-29 NOTE — Assessment & Plan Note (Addendum)
 Diabetes management at group home with concerns about care variability and missed labs. Per patient and friend, they are UTD on A1c ,lipid, microalbumin. Reports UTD on eye exam as well.  - Contact group home to confirm recent lab work and request records. - Send lab order if recent labs are missing.

## 2024-04-29 NOTE — Assessment & Plan Note (Signed)
 At goal, managed by doctor at group does not need refills.

## 2024-04-29 NOTE — Patient Instructions (Addendum)
 Good to meet you! Welcome to [facility]!  As your primary care doctor, I look forward to working with you to help you reach your health goals.  Please be aware of a couple of logistical items: - If you message me on mychart, it may take me 1-2 business days to get back to you. This is for non-urgent messaging.  - If you require urgent clinical attention, please call the clinic or present to urgent care/emergency room - If you have labs, I typically will send a message about them in 1-2 business days. - I am not here on Mondays, otherwise will be available from Tuesday-Friday during 8a-5pm.

## 2024-06-10 ENCOUNTER — Other Ambulatory Visit: Payer: Self-pay

## 2024-06-10 ENCOUNTER — Emergency Department
Admission: EM | Admit: 2024-06-10 | Discharge: 2024-06-10 | Disposition: A | Discharge status: Home / Self Care | Attending: Emergency Medicine | Admitting: Emergency Medicine

## 2024-06-10 DIAGNOSIS — I10 Essential (primary) hypertension: Secondary | ICD-10-CM

## 2024-06-10 DIAGNOSIS — Z760 Encounter for issue of repeat prescription: Secondary | ICD-10-CM | POA: Insufficient documentation

## 2024-06-10 DIAGNOSIS — R739 Hyperglycemia, unspecified: Secondary | ICD-10-CM

## 2024-06-10 MED ORDER — FAMOTIDINE 20 MG PO TABS
20.0000 mg | ORAL_TABLET | Freq: Every day | ORAL | 0 refills | Status: DC
Start: 2024-06-10 — End: 2024-07-07

## 2024-06-10 MED ORDER — METFORMIN HCL 500 MG PO TABS: 500.0000 mg | ORAL_TABLET | Freq: Three times a day (TID) | ORAL | 0 refills | Status: DC

## 2024-06-10 MED ORDER — ALBUTEROL SULFATE HFA 108 (90 BASE) MCG/ACT IN AERS: 2.0000 | INHALATION_SPRAY | Freq: Four times a day (QID) | RESPIRATORY_TRACT | 2 refills | Status: DC | PRN

## 2024-06-10 MED ORDER — CARVEDILOL 25 MG PO TABS: 25.0000 mg | ORAL_TABLET | Freq: Two times a day (BID) | ORAL | 0 refills | Status: DC

## 2024-06-10 MED ORDER — PHENYTOIN SODIUM EXTENDED 100 MG PO CAPS: 100.0000 mg | ORAL_CAPSULE | Freq: Every day | ORAL | 0 refills | Status: DC

## 2024-06-10 MED ORDER — HYDRALAZINE HCL 25 MG PO TABS: 25.0000 mg | ORAL_TABLET | Freq: Four times a day (QID) | ORAL | 0 refills | Status: DC

## 2024-06-10 MED ORDER — ATORVASTATIN CALCIUM 80 MG PO TABS: 80.0000 mg | ORAL_TABLET | Freq: Every day | ORAL | 0 refills | Status: DC

## 2024-06-10 MED ORDER — CLONIDINE HCL 0.1 MG PO TABS: 0.1000 mg | ORAL_TABLET | Freq: Three times a day (TID) | ORAL | 11 refills | Status: DC | PRN

## 2024-06-10 MED ORDER — VITAMIN D 50 MCG (2000 UT) PO CAPS: 1.0000 | ORAL_CAPSULE | Freq: Every morning | ORAL | 0 refills | Status: DC

## 2024-06-10 MED ORDER — FUROSEMIDE 40 MG PO TABS: 20.0000 mg | ORAL_TABLET | Freq: Every day | ORAL | 0 refills | Status: DC

## 2024-06-10 MED ORDER — SENNOSIDES-DOCUSATE SODIUM 8.6-50 MG PO TABS: 2.0000 | ORAL_TABLET | Freq: Every day | ORAL | 0 refills | Status: AC

## 2024-06-10 MED ORDER — SPIRONOLACTONE 25 MG PO TABS: 12.5000 mg | ORAL_TABLET | Freq: Every day | ORAL | 0 refills | Status: DC

## 2024-06-10 MED ORDER — MAGNESIUM OXIDE 400 (241.3 MG) MG PO TABS: 400.0000 mg | ORAL_TABLET | Freq: Two times a day (BID) | ORAL | 0 refills | Status: DC

## 2024-06-10 MED ORDER — AMLODIPINE BESYLATE 10 MG PO TABS: 10.0000 mg | ORAL_TABLET | Freq: Every day | ORAL | 0 refills | Status: DC

## 2024-06-10 MED ORDER — SODIUM CHLORIDE 1 G PO TABS: 1.0000 g | ORAL_TABLET | Freq: Two times a day (BID) | ORAL | 0 refills | Status: DC

## 2024-06-10 MED ORDER — MAGNESIUM OXIDE 400 (241.3 MG) MG PO TABS: 400.0000 mg | ORAL_TABLET | Freq: Two times a day (BID) | ORAL | 0 refills | Status: AC

## 2024-06-10 MED ORDER — ACETAMINOPHEN 325 MG PO TABS: 650.0000 mg | ORAL_TABLET | Freq: Three times a day (TID) | ORAL | 0 refills | Status: DC | PRN

## 2024-06-10 MED ORDER — LOSARTAN POTASSIUM 100 MG PO TABS
100.0000 mg | ORAL_TABLET | Freq: Every day | ORAL | 0 refills | Status: DC
Start: 2024-06-10 — End: 2024-07-07

## 2024-06-10 MED ORDER — LOSARTAN POTASSIUM 100 MG PO TABS: 100.0000 mg | ORAL_TABLET | Freq: Every day | ORAL | 0 refills | Status: DC

## 2024-06-10 MED ORDER — CLOPIDOGREL BISULFATE 75 MG PO TABS: 75.0000 mg | ORAL_TABLET | Freq: Every day | ORAL | 0 refills | Status: DC

## 2024-06-10 MED ORDER — SERTRALINE HCL 25 MG PO TABS: 25.0000 mg | ORAL_TABLET | Freq: Every day | ORAL | 0 refills | Status: DC

## 2024-06-10 MED ORDER — CLONIDINE HCL 0.1 MG PO TABS: 0.1000 mg | ORAL_TABLET | Freq: Every day | ORAL | 0 refills | Status: DC

## 2024-06-10 MED ORDER — ALBUTEROL SULFATE HFA 108 (90 BASE) MCG/ACT IN AERS: 2.0000 | INHALATION_SPRAY | Freq: Four times a day (QID) | RESPIRATORY_TRACT | 2 refills | Status: AC | PRN

## 2024-06-10 MED ORDER — TAMSULOSIN HCL 0.4 MG PO CAPS: 0.4000 mg | ORAL_CAPSULE | Freq: Every day | ORAL | 0 refills | Status: DC

## 2024-06-10 MED ORDER — SENNOSIDES-DOCUSATE SODIUM 8.6-50 MG PO TABS: 2.0000 | ORAL_TABLET | Freq: Every day | ORAL | 0 refills | Status: DC

## 2024-06-10 MED ORDER — VITAMIN D 50 MCG (2000 UT) PO CAPS: 1.0000 | ORAL_CAPSULE | Freq: Every morning | ORAL | 0 refills | Status: AC

## 2024-06-10 MED ORDER — SODIUM CHLORIDE 1 G PO TABS: 1.0000 g | ORAL_TABLET | Freq: Two times a day (BID) | ORAL | 0 refills | Status: AC

## 2024-06-10 MED ORDER — FAMOTIDINE 20 MG PO TABS: 20.0000 mg | ORAL_TABLET | Freq: Every day | ORAL | 0 refills | Status: DC

## 2024-06-10 MED ORDER — ACETAMINOPHEN 325 MG PO TABS: 650.0000 mg | ORAL_TABLET | Freq: Three times a day (TID) | ORAL | 0 refills | Status: AC | PRN

## 2024-06-10 MED ORDER — FUROSEMIDE 20 MG PO TABS: 20.0000 mg | ORAL_TABLET | Freq: Every day | ORAL | 0 refills | Status: DC

## 2024-06-10 NOTE — Discharge Instructions (Signed)
 Medications were refilled.  Return to the ER for worsening symptoms or any other concerns

## 2024-06-10 NOTE — ED Triage Notes (Signed)
 Pt to ED via POV from [facility]. Pt here with staff reporting pt needs a 30 day refill on medications. Caregiver reports has been having trouble with provider they have been using and pt runs out of medication tomorrow. Caregiver has MAR with him.

## 2024-06-10 NOTE — ED Provider Notes (Signed)
   [facility] Provider Note    Event Date/Time   First MD Initiated Contact with Patient 06/10/24 1000     (approximate)   History   Medication Refill   HPI  Brad Graham is a 63 y.o. male   who comes in from group home due to concerns for needing a prescription of medications.  A group home staff member reports that they need a 30-day refill as they are having issues with their provider and getting medication refills.  They do report they also have another patient here today for similar concerns.  Patient is otherwise acting his normal self and otherwise healthy.  Patient himself denies any concerns   Physical Exam   Triage Vital Signs: ED Triage Vitals  Encounter Vitals Group     BP 06/10/24 0940 132/80     Girls Systolic BP Percentile --      Girls Diastolic BP Percentile --      Boys Systolic BP Percentile --      Boys Diastolic BP Percentile --      Pulse Rate 06/10/24 0940 66     Resp 06/10/24 0940 18     Temp 06/10/24 0938 98 F (36.7 C)     Temp Source 06/10/24 0938 Oral     SpO2 06/10/24 0940 100 %     Weight --      Height --      Head Circumference --      Peak Flow --      Pain Score 06/10/24 0938 0     Pain Loc --      Pain Education --      Exclude from Growth Chart --     Most recent vital signs: Vitals:   06/10/24 0938 06/10/24 0940  BP:  132/80  Pulse:  66  Resp:  18  Temp: 98 F (36.7 C)   SpO2:  100%     General: Awake, no distress.  CV:  Good peripheral perfusion.  Resp:  Normal effort.  Abd:  No distention.  Other:     ED Results / Procedures / Treatments    IMPRESSION / MDM / ASSESSMENT AND PLAN / ED COURSE  I reviewed the triage vital signs and the nursing notes.   Patient's presentation is most consistent with acute, uncomplicated illness.    Pt comes in with need for medications refills.  Denies any other concerns vitals are stable and reassuring      FINAL CLINICAL IMPRESSION(S) / ED  DIAGNOSES   Final diagnoses:  Medication refill     Rx / DC Orders   ED Discharge Orders     None        Note:  This document was prepared using Dragon voice recognition software and may include unintentional dictation errors.   Ernest Ronal BRAVO, MD 06/10/24 [phone]

## 2024-06-15 ENCOUNTER — Encounter: Payer: Self-pay | Admitting: Podiatry

## 2024-06-15 ENCOUNTER — Ambulatory Visit (INDEPENDENT_AMBULATORY_CARE_PROVIDER_SITE_OTHER): Payer: MEDICAID | Admitting: Podiatry

## 2024-06-15 DIAGNOSIS — M79675 Pain in left toe(s): Secondary | ICD-10-CM

## 2024-06-15 DIAGNOSIS — E119 Type 2 diabetes mellitus without complications: Secondary | ICD-10-CM

## 2024-06-15 DIAGNOSIS — M79674 Pain in right toe(s): Secondary | ICD-10-CM | POA: Diagnosis not present

## 2024-06-15 DIAGNOSIS — B351 Tinea unguium: Secondary | ICD-10-CM | POA: Diagnosis not present

## 2024-06-16 NOTE — Progress Notes (Signed)
  Subjective:  Patient ID: Brad Graham, male    DOB: [birth year],  MRN: [id number]  Brad Graham presents to clinic today for preventative diabetic foot care for painful mycotic toenails of both feet that are difficult to trim. Pain interferes with daily activities and wearing enclosed shoe gear comfortably.  Chief Complaint  Patient presents with   Diabetes    DFC NIDDM A1C 6.8. Toenail trim. LOV with PCP ?    New problem(s): None.   PCP is Herold Hadassah SQUIBB, MD. ARNETTA 04/29/2024.  No Known Allergies  Review of Systems: Negative except as noted in the HPI.  Objective: No changes noted in today's physical examination. There were no vitals filed for this visit. Brad Graham is a pleasant 63 y.o. male obese in NAD. AAO x 3.  Vascular Examination: CFT <4 seconds b/l LE. Palpable DP pulse(s) b/l LE. Palpable PT pulse(s) b/l LE. Pedal hair absent. Lymphedema present BLE. No ischemia or gangrene noted b/l LE. No cyanosis or clubbing noted b/l LE.  Neurological Examination: Protective sensation intact 5/5 intact bilaterally with 10g monofilament b/l. Vibratory sensation diminished b/l.  Dermatological Examination: Pedal skin with normal turgor, texture and tone b/l.  No open wounds. No interdigital macerations.   Toenails 1-5 b/l thick, discolored, elongated with subungual debris and pain on dorsal palpation.   No open wounds b/l LE. No interdigital macerations noted b/l LE. Toenails 1-5 bilaterally elongated, discolored, dystrophic, thickened, and crumbly with subungual debris and tenderness to dorsal palpation. No hyperkeratotic nor porokeratotic lesions present on today's visit.  Musculoskeletal Examination: Muscle strength 5/5 to all lower extremity muscle groups bilaterally. No pain, crepitus or joint limitation noted with ROM b/l LE. Pes planus deformity noted bilateral LE.SABRA Patient ambulates with cane assistance.  Radiographs: None  Assessment/Plan: 1. Pain due to  onychomycosis of toenails of both feet   2. Diabetes mellitus without complication [facility])   Patient was evaluated and treated. All patient's and/or POA's questions/concerns addressed on today's visit. Mycotic toenails 1-5 debrided in length and girth without incident.  Treatment was provided by assistant Andrez Manchester under my supervision. Continue daily foot inspections and monitor blood glucose per PCP/Endocrinologist's recommendations.Continue soft, supportive shoe gear daily. Report any pedal injuries to medical professional. Call office if there are any quesitons/concerns.  Return in about 3 months (around 09/14/2024).  Delon LITTIE Merlin, DPM      [place] LOCATION: [address] [place] [address]                   Office [phone]   [facility] LOCATION: [address] [place], [place] [address] Office [phone]

## 2024-06-25 ENCOUNTER — Ambulatory Visit: Admitting: Pediatrics

## 2024-06-30 ENCOUNTER — Encounter: Payer: Self-pay | Admitting: Pediatrics

## 2024-06-30 ENCOUNTER — Ambulatory Visit (INDEPENDENT_AMBULATORY_CARE_PROVIDER_SITE_OTHER): Admitting: Pediatrics

## 2024-06-30 VITALS — BP 119/71 | HR 75 | Temp 97.6°F | Ht 68.0 in | Wt 235.0 lb

## 2024-06-30 DIAGNOSIS — D649 Anemia, unspecified: Secondary | ICD-10-CM

## 2024-06-30 DIAGNOSIS — G40409 Other generalized epilepsy and epileptic syndromes, not intractable, without status epilepticus: Secondary | ICD-10-CM | POA: Diagnosis not present

## 2024-06-30 DIAGNOSIS — I69351 Hemiplegia and hemiparesis following cerebral infarction affecting right dominant side: Secondary | ICD-10-CM | POA: Diagnosis not present

## 2024-06-30 DIAGNOSIS — Z23 Encounter for immunization: Secondary | ICD-10-CM | POA: Diagnosis not present

## 2024-06-30 DIAGNOSIS — E782 Mixed hyperlipidemia: Secondary | ICD-10-CM

## 2024-06-30 DIAGNOSIS — E119 Type 2 diabetes mellitus without complications: Secondary | ICD-10-CM

## 2024-06-30 DIAGNOSIS — Z7984 Long term (current) use of oral hypoglycemic drugs: Secondary | ICD-10-CM

## 2024-06-30 DIAGNOSIS — K219 Gastro-esophageal reflux disease without esophagitis: Secondary | ICD-10-CM

## 2024-06-30 DIAGNOSIS — F32A Depression, unspecified: Secondary | ICD-10-CM

## 2024-06-30 DIAGNOSIS — I10 Essential (primary) hypertension: Secondary | ICD-10-CM | POA: Diagnosis not present

## 2024-06-30 LAB — MICROALBUMIN, URINE WAIVED
Creatinine, Urine Waived: 10 mg/dL (ref 10–300)
Microalb, Ur Waived: 10 mg/L (ref 0–19)

## 2024-06-30 NOTE — Patient Instructions (Signed)
 Good to see you!  I will call if we can make adjustments on your medication once I have the lab results from today.

## 2024-06-30 NOTE — Progress Notes (Signed)
 iron  Office Visit  BP 119/71   Pulse 75   Temp 97.6 F (36.4 C) (Oral)   Ht 5' 8 (1.727 m)   Wt 235 lb (106.6 kg)   SpO2 97%   BMI 35.73 kg/m    Subjective:    Patient ID: Brad Graham, male    DOB: 1961-01-23, 63 y.o.   MRN: [id number]  HPI: Brad Graham is a 63 y.o. male  Chief Complaint  Patient presents with   Diabetes   Hyperlipidemia   Hypertension    Discussed the use of AI scribe software for clinical note transcription with the patient, who gave verbal consent to proceed.  History of Present Illness   Brad Graham is a 63 year old male who presents for reestablishment of care and medication reconciliation.  He has been residing at a group home and has not seen a doctor since his transfer from [facility] following the passing of his previous doctor.  The primary focus of this visit is to reconcile his medications and ensure he receives necessary refills. He is concerned about the number of medications he is currently taking and wants to reduce them, particularly noting that he does not want to stop taking Tylenol , which he uses for pain relief.  His diabetes management is also a point of discussion. His glucose levels have been monitored daily at the group home, with morning readings between 79 and 88 mg/dL and afternoon readings between 87 and 138 mg/dL for the month of October. However, there is no recent A1c available, which is noted as a gap in his diabetes management.     Relevant past medical, surgical, family and social history reviewed and updated as indicated. Interim medical history since our last visit reviewed. Allergies and medications reviewed and updated.  ROS per HPI unless specifically indicated above     Objective:    BP 119/71   Pulse 75   Temp 97.6 F (36.4 C) (Oral)   Ht 5' 8 (1.727 m)   Wt 235 lb (106.6 kg)   SpO2 97%   BMI 35.73 kg/m   Wt Readings from Last 3 Encounters:  06/30/24 235 lb (106.6 kg)  04/29/24 235 lb  6.4 oz (106.8 kg)  03/13/24 266 lb 6.4 oz (120.8 kg)     Physical Exam Constitutional:      Appearance: Normal appearance.  Pulmonary:     Effort: Pulmonary effort is normal.  Musculoskeletal:        General: Normal range of motion.  Skin:    Comments: Normal skin color  Neurological:     General: No focal deficit present.     Mental Status: He is alert. Mental status is at baseline.  Psychiatric:        Mood and Affect: Mood normal.        Behavior: Behavior normal.        Thought Content: Thought content normal.         06/30/2024   11:34 AM 04/29/2024    9:54 AM 02/02/2020    2:38 PM 05/10/2015    3:39 PM  Depression screen PHQ 2/9  Decreased Interest 2 0 0 0  Down, Depressed, Hopeless 0 0 0 0  PHQ - 2 Score 2 0 0 0  Altered sleeping 0 0    Tired, decreased energy 0 0    Change in appetite 0 0    Feeling bad or failure about yourself  0 0  Trouble concentrating 0 1    Moving slowly or fidgety/restless 0 0    Suicidal thoughts 0 0    PHQ-9 Score 2 1    Difficult doing work/chores Not difficult at all Not difficult at all         06/30/2024   11:35 AM 04/29/2024    9:55 AM  GAD 7 : Generalized Anxiety Score  Nervous, Anxious, on Edge 0 0  Control/stop worrying 0 0  Worry too much - different things 0 1  Trouble relaxing 0 0  Restless 0 0  Easily annoyed or irritable 0 0  Afraid - awful might happen 0 0  Total GAD 7 Score 0 1  Anxiety Difficulty Not difficult at all        Assessment & Plan:  Assessment & Plan   Essential hypertension Assessment & Plan: Well controlled on current regimen. Suspect can d/c spironolactone . See below for med rec.  Orders: -     amLODIPine  Besylate; Take 1 tablet (10 mg total) by mouth daily.  Dispense: 90 tablet; Refill: 3 -     Carvedilol ; Take 1 tablet (25 mg total) by mouth 2 (two) times daily with a meal.  Dispense: 120 tablet; Refill: 3 -     Furosemide ; Take 1 tablet (20 mg total) by mouth daily.  Dispense: 90  tablet; Refill: 3 -     hydrALAZINE  HCl; Take 1 tablet (25 mg total) by mouth 4 (four) times daily.  Dispense: 120 tablet; Refill: 1 -     Losartan  Potassium; Take 1 tablet (100 mg total) by mouth daily.  Dispense: 90 tablet; Refill: 3  Diabetes mellitus without complication (HCC) Assessment & Plan: Diabetes well-controlled with glucose levels between 79-138 mg/dL. No recent A1c available. - Order baseline blood work including A1c. - Consider reducing metformin  dosage based on blood work results.  Orders: -     Comprehensive metabolic panel with GFR -     Hemoglobin A1c -     CBC with Differential/Platelet -     Lipid panel -     Microalbumin, Urine Waived  Hemiplegia and hemiparesis following cerebral infarction affecting right dominant side (HCC) Continue plavix . Will look for records regarding long term recommendation. -     Clopidogrel  Bisulfate; Take 1 tablet (75 mg total) by mouth daily.  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  Orders: -     Phenytoin  Sodium Extended; Take 1 capsule (100 mg total) by mouth at bedtime.  Dispense: 90 capsule; Refill: 3  Depression, unspecified depression type Assessment & Plan: Stable on zoloft  25mg . Consider removing in future given unclear benefit for patient.  Orders: -     Sertraline  HCl; Take 1 tablet (25 mg total) by mouth daily.  Dispense: 90 tablet; Refill: 3  Mixed hyperlipidemia Assessment & Plan: Needs refills.  Orders: -     Atorvastatin  Calcium ; Take 1 tablet (80 mg total) by mouth daily at 6 PM.  Dispense: 90 tablet; Refill: 3  Need for vaccination -     Flu vaccine HIGH DOSE PF(Fluzone Trivalent)  Anemia, unspecified type H/o this, will repeat below.  -     Iron and TIBC; Future -     Ferritin; Future -     Vitamin B12; Future -     Folate; Future -     Iron and TIBC -     Folate -  Vitamin B12 -     Ferritin -      Specimen status report  Gastroesophageal reflux disease, unspecified whether esophagitis present Chronic well controlled with famotidine . -     Famotidine ; Take 1 tablet (20 mg total) by mouth daily.  Dispense: 90 tablet; Refill: 3   Follow up plan: Return in about 6 months (around 12/29/2024).  Hadassah SHAUNNA Nett, MD

## 2024-07-01 ENCOUNTER — Other Ambulatory Visit: Payer: Self-pay | Admitting: Pediatrics

## 2024-07-01 ENCOUNTER — Ambulatory Visit: Payer: Self-pay | Admitting: Pediatrics

## 2024-07-01 LAB — LIPID PANEL
Chol/HDL Ratio: 1.9 ratio (ref 0.0–5.0)
Cholesterol, Total: 132 mg/dL (ref 100–199)
HDL: 69 mg/dL (ref >39)
LDL Chol Calc (NIH): 50 mg/dL (ref 0–99)
Triglycerides: 64 mg/dL (ref 0–149)
VLDL Cholesterol Cal: 13 mg/dL (ref 5–40)

## 2024-07-01 LAB — COMPREHENSIVE METABOLIC PANEL WITH GFR
ALT: 14 IU/L (ref 0–44)
AST: 17 IU/L (ref 0–40)
Albumin: 4.1 g/dL (ref 3.9–4.9)
Alkaline Phosphatase: 118 IU/L (ref 47–123)
BUN/Creatinine Ratio: 15 (ref 10–24)
BUN: 17 mg/dL (ref 8–27)
Bilirubin Total: <0.2 mg/dL (ref 0.0–1.2)
CO2: 21 mmol/L (ref 20–29)
Calcium: 9.3 mg/dL (ref 8.6–10.2)
Chloride: 102 mmol/L (ref 96–106)
Creatinine, Ser: 1.13 mg/dL (ref 0.76–1.27)
Globulin, Total: 2.8 g/dL (ref 1.5–4.5)
Glucose: 93 mg/dL (ref 70–99)
Potassium: 4.4 mmol/L (ref 3.5–5.2)
Sodium: 137 mmol/L (ref 134–144)
Total Protein: 6.9 g/dL (ref 6.0–8.5)
eGFR: 72 mL/min/1.73 (ref >59)

## 2024-07-01 LAB — HEMOGLOBIN A1C
Est. average glucose Bld gHb Est-mCnc: 123 mg/dL
Hgb A1c MFr Bld: 5.9 % — ABNORMAL HIGH (ref 4.8–5.6)

## 2024-07-01 LAB — CBC WITH DIFFERENTIAL/PLATELET
Basophils Absolute: 0 x10E3/uL (ref 0.0–0.2)
Basos: 1 %
EOS (ABSOLUTE): 0.1 x10E3/uL (ref 0.0–0.4)
Eos: 1 %
Hematocrit: 36.2 % — ABNORMAL LOW (ref 37.5–51.0)
Hemoglobin: 10.9 g/dL — ABNORMAL LOW (ref 13.0–17.7)
Immature Grans (Abs): 0 x10E3/uL (ref 0.0–0.1)
Immature Granulocytes: 0 %
Lymphocytes Absolute: 1.7 x10E3/uL (ref 0.7–3.1)
Lymphs: 35 %
MCH: 26.1 pg — ABNORMAL LOW (ref 26.6–33.0)
MCHC: 30.1 g/dL — ABNORMAL LOW (ref 31.5–35.7)
MCV: 87 fL (ref 79–97)
Monocytes Absolute: 0.5 x10E3/uL (ref 0.1–0.9)
Monocytes: 10 %
Neutrophils Absolute: 2.7 x10E3/uL (ref 1.4–7.0)
Neutrophils: 53 %
Platelets: 192 x10E3/uL (ref 150–450)
RBC: 4.17 x10E6/uL (ref 4.14–5.80)
RDW: 13.1 % (ref 11.6–15.4)
WBC: 5 x10E3/uL (ref 3.4–10.8)

## 2024-07-03 LAB — FOLATE

## 2024-07-03 LAB — IRON AND TIBC
Iron Saturation: 23 % (ref 15–55)
Iron: 62 ug/dL (ref 38–169)
Total Iron Binding Capacity: 274 ug/dL (ref 250–450)
UIBC: 212 ug/dL (ref 111–343)

## 2024-07-03 LAB — FERRITIN: Ferritin: 48 ng/mL (ref 30–400)

## 2024-07-03 LAB — VITAMIN B12

## 2024-07-03 LAB — SPECIMEN STATUS REPORT

## 2024-07-03 NOTE — Telephone Encounter (Signed)
 Requested Prescriptions  Pending Prescriptions Disp Refills   tamsulosin  (FLOMAX ) 0.4 MG CAPS capsule [Pharmacy Med Name: Tamsulosin  HCl 0.4 MG Capsule] 5 capsule 10    Sig: TAKE 1 CAPSULE BY MOUTH ONCE DAILY *DO NOT CRUSH OR CHEW* **DO NOT OPEN CAPSULE**     Urology: Alpha-Adrenergic Blocker Failed - 07/03/2024 11:22 AM      Failed - PSA in normal range and within 360 days    No results found for: LABPSA, PSA, PSA1, ULTRAPSA       Passed - Last BP in normal range    BP Readings from Last 1 Encounters:  06/30/24 119/71         Passed - Valid encounter within last 12 months    Recent Outpatient Visits           3 days ago Essential hypertension   [place] [facility] Hadassah SQUIBB, MD   2 months ago Essential hypertension   [place] [facility] Herold Hadassah SQUIBB, MD

## 2024-07-07 ENCOUNTER — Other Ambulatory Visit: Payer: Self-pay | Admitting: Pediatrics

## 2024-07-07 ENCOUNTER — Encounter: Payer: Self-pay | Admitting: Pediatrics

## 2024-07-07 DIAGNOSIS — E119 Type 2 diabetes mellitus without complications: Secondary | ICD-10-CM

## 2024-07-07 MED ORDER — AMLODIPINE BESYLATE 10 MG PO TABS
10.0000 mg | ORAL_TABLET | Freq: Every day | ORAL | 3 refills | Status: DC
Start: 2024-07-07 — End: 2024-07-07

## 2024-07-07 MED ORDER — PHENYTOIN SODIUM EXTENDED 100 MG PO CAPS
100.0000 mg | ORAL_CAPSULE | Freq: Every day | ORAL | 3 refills | Status: AC
Start: 2024-07-07 — End: 2025-07-07

## 2024-07-07 MED ORDER — HYDRALAZINE HCL 25 MG PO TABS
25.0000 mg | ORAL_TABLET | Freq: Four times a day (QID) | ORAL | 1 refills | Status: DC
Start: 2024-07-07 — End: 2024-07-07

## 2024-07-07 MED ORDER — METFORMIN HCL 500 MG PO TABS
500.0000 mg | ORAL_TABLET | Freq: Every day | ORAL | 2 refills | Status: DC
Start: 2024-07-07 — End: 2024-08-07

## 2024-07-07 MED ORDER — CLOPIDOGREL BISULFATE 75 MG PO TABS: 75.0000 mg | ORAL_TABLET | Freq: Every day | ORAL | 3 refills | Status: AC

## 2024-07-07 MED ORDER — SERTRALINE HCL 25 MG PO TABS: 25.0000 mg | ORAL_TABLET | Freq: Every day | ORAL | 3 refills | Status: AC

## 2024-07-07 MED ORDER — FAMOTIDINE 20 MG PO TABS: 20.0000 mg | ORAL_TABLET | Freq: Every day | ORAL | 3 refills | Status: DC

## 2024-07-07 MED ORDER — SERTRALINE HCL 25 MG PO TABS
25.0000 mg | ORAL_TABLET | Freq: Every day | ORAL | 3 refills | Status: DC
Start: 2024-07-07 — End: 2024-07-07

## 2024-07-07 MED ORDER — FUROSEMIDE 20 MG PO TABS: 20.0000 mg | ORAL_TABLET | Freq: Every day | ORAL | 3 refills | Status: AC

## 2024-07-07 MED ORDER — AMLODIPINE BESYLATE 10 MG PO TABS: 10.0000 mg | ORAL_TABLET | Freq: Every day | ORAL | 3 refills | Status: AC

## 2024-07-07 MED ORDER — FUROSEMIDE 20 MG PO TABS
20.0000 mg | ORAL_TABLET | Freq: Every day | ORAL | 3 refills | Status: DC
Start: 2024-07-07 — End: 2024-07-07

## 2024-07-07 MED ORDER — FAMOTIDINE 20 MG PO TABS: 20.0000 mg | ORAL_TABLET | Freq: Every day | ORAL | 3 refills | Status: AC

## 2024-07-07 MED ORDER — PHENYTOIN SODIUM EXTENDED 100 MG PO CAPS
100.0000 mg | ORAL_CAPSULE | Freq: Every day | ORAL | 3 refills | Status: DC
Start: 2024-07-07 — End: 2024-07-07

## 2024-07-07 MED ORDER — LOSARTAN POTASSIUM 100 MG PO TABS
100.0000 mg | ORAL_TABLET | Freq: Every day | ORAL | 3 refills | Status: DC
Start: 2024-07-07 — End: 2024-07-07

## 2024-07-07 MED ORDER — CARVEDILOL 25 MG PO TABS: 25.0000 mg | ORAL_TABLET | Freq: Two times a day (BID) | ORAL | 3 refills | Status: AC

## 2024-07-07 MED ORDER — HYDRALAZINE HCL 25 MG PO TABS: 25.0000 mg | ORAL_TABLET | Freq: Four times a day (QID) | ORAL | 1 refills | Status: DC

## 2024-07-07 MED ORDER — CLOPIDOGREL BISULFATE 75 MG PO TABS
75.0000 mg | ORAL_TABLET | Freq: Every day | ORAL | 3 refills | Status: DC
Start: 2024-07-07 — End: 2024-07-07

## 2024-07-07 MED ORDER — ATORVASTATIN CALCIUM 80 MG PO TABS: 80.0000 mg | ORAL_TABLET | Freq: Every day | ORAL | 3 refills | Status: AC

## 2024-07-07 MED ORDER — LOSARTAN POTASSIUM 100 MG PO TABS
100.0000 mg | ORAL_TABLET | Freq: Every day | ORAL | 3 refills | Status: AC
Start: 2024-07-07 — End: 2025-07-07

## 2024-07-07 MED ORDER — CARVEDILOL 25 MG PO TABS: 25.0000 mg | ORAL_TABLET | Freq: Two times a day (BID) | ORAL | 3 refills | Status: DC

## 2024-07-07 MED ORDER — ATORVASTATIN CALCIUM 80 MG PO TABS: 80.0000 mg | ORAL_TABLET | Freq: Every day | ORAL | 3 refills | Status: DC

## 2024-07-07 NOTE — Assessment & Plan Note (Signed)
 Chronic no recent episodes, needs refills of below.

## 2024-07-07 NOTE — Addendum Note (Signed)
 Addended by: Aroura Vasudevan P on: 07/07/2024 08:58 AM   Modules accepted: Orders

## 2024-07-07 NOTE — Assessment & Plan Note (Signed)
 Diabetes well-controlled with glucose levels between 79-138 mg/dL. No recent A1c available. - Order baseline blood work including A1c. - Consider reducing metformin  dosage based on blood work results.

## 2024-07-07 NOTE — Progress Notes (Signed)
 Updated med list to d/c spironolactone  and change metformin  to once daily.  Consider d/c sertraline  and/or lasix  at follow up in an effort to reduce medication burden per patient request.  Hadassah SHAUNNA Nett, MD

## 2024-07-07 NOTE — Assessment & Plan Note (Signed)
 Stable on zoloft  25mg . Consider removing in future given unclear benefit for patient.

## 2024-07-07 NOTE — Assessment & Plan Note (Signed)
 Needs refills

## 2024-07-07 NOTE — Assessment & Plan Note (Signed)
 Well controlled on current regimen. Suspect can d/c spironolactone . See below for med rec.

## 2024-07-14 ENCOUNTER — Other Ambulatory Visit: Payer: Self-pay | Admitting: Pediatrics

## 2024-07-16 NOTE — Telephone Encounter (Signed)
 Requested medication (s) are due for refill today:   Requested medication (s) are on the active medication list: No  Last refill:  ?  Future visit scheduled: Yes  Notes to clinic:  Not on medication list.    Requested Prescriptions  Pending Prescriptions Disp Refills   Lancets (EMBRACE PRESSURE ACTIVATED 28G) MISC [Pharmacy Med Name: Embrace Pressure Activated 28G Miscellaneous] 100 each 10    Sig: USE AS DIRECTED ONCE DAILY     Endocrinology: Diabetes - Testing Supplies Passed - 07/16/2024 12:26 PM      Passed - Valid encounter within last 12 months    Recent Outpatient Visits           2 weeks ago Essential hypertension   [place] [facility] Herold Hadassah SQUIBB, MD   2 months ago Essential hypertension   [place] [facility] Herold Hadassah SQUIBB, MD

## 2024-07-28 ENCOUNTER — Other Ambulatory Visit: Payer: Self-pay | Admitting: Pediatrics

## 2024-07-30 ENCOUNTER — Telehealth: Payer: Self-pay | Admitting: Pediatrics

## 2024-07-30 NOTE — Telephone Encounter (Signed)
 Requested medication (s) are due for refill today - yes  Requested medication (s) are on the active medication list -yes  Future visit scheduled -no  Last refill: 02/21/23  Notes to clinic: listed as historical medication, no protocol listed  Requested Prescriptions  Pending Prescriptions Disp Refills   Multiple Vitamin (THEREMS) TABS [Pharmacy Med Name: Therems Tablet] 20 tablet 10    Sig: @ TAKE 1 TABLET BY MOUTH ONCE DAILY     There is no refill protocol information for this order       Requested Prescriptions  Pending Prescriptions Disp Refills   Multiple Vitamin (THEREMS) TABS [Pharmacy Med Name: Therems Tablet] 20 tablet 10    Sig: @ TAKE 1 TABLET BY MOUTH ONCE DAILY     There is no refill protocol information for this order

## 2024-07-30 NOTE — Telephone Encounter (Signed)
 Copied from CRM #[id number]. Topic: Clinical - Medication Question >> Jul 30, 2024 12:24 PM Leonette SQUIBB wrote: Reason for CRM: Rollo at [facility].  She wants to ask a question regarding the Clonidine  0.1.  CB#  [id number]

## 2024-07-31 ENCOUNTER — Telehealth: Payer: Self-pay

## 2024-07-31 NOTE — Telephone Encounter (Signed)
 Copied from CRM [id number]. Topic: Clinical - Medical Advice >> Jul 30, 2024  2:21 PM Yolanda T wrote: Reason for CRM: Nadeen [phone] from [facility] called to see how many times patient needs a BP check as they have been checking 1x a day. Please f/u with patient

## 2024-07-31 NOTE — Telephone Encounter (Signed)
 Called and notified Brad Graham of Dr. Lenard message. Brad Graham states that they need a written order sent to them stating this. Her fax number is [phone].   Order written and awaiting providers signature. Will fax order once signed.

## 2024-08-03 ENCOUNTER — Telehealth: Payer: Self-pay | Admitting: Pediatrics

## 2024-08-03 NOTE — Telephone Encounter (Signed)
 Called patient and left a message for him to call back to get scheduled for [facility]

## 2024-08-03 NOTE — Telephone Encounter (Signed)
 Ok for E2C2 to review.   Returned call but had to leave a message. If she calls back, please find out exactly what her question (s) are so we can better assist.

## 2024-08-04 ENCOUNTER — Encounter: Admitting: General Practice

## 2024-08-06 ENCOUNTER — Telehealth: Payer: Self-pay | Admitting: Pediatrics

## 2024-08-06 ENCOUNTER — Encounter: Payer: Self-pay | Admitting: Pediatrics

## 2024-08-06 NOTE — Telephone Encounter (Signed)
 Faxed

## 2024-08-07 ENCOUNTER — Other Ambulatory Visit: Payer: Self-pay | Admitting: Pediatrics

## 2024-08-07 DIAGNOSIS — E119 Type 2 diabetes mellitus without complications: Secondary | ICD-10-CM

## 2024-08-07 NOTE — Telephone Encounter (Signed)
 SABRA

## 2024-08-10 ENCOUNTER — Other Ambulatory Visit: Payer: Self-pay | Admitting: Pediatrics

## 2024-08-10 DIAGNOSIS — E119 Type 2 diabetes mellitus without complications: Secondary | ICD-10-CM

## 2024-08-10 NOTE — Telephone Encounter (Signed)
 Patient is on many medications. This RN contacted pharmacy to request exact list being requested.   D3 Clonidine  Spironolactone  Metformin  Mag ox Sodium Chloride  Senna   Copied from CRM #[id number]. Topic: Clinical - Prescription Issue >> Aug 10, 2024  3:47 PM Antony RAMAN wrote: Reason for CRM: lian from [phone] , calling for all 7 of the patients medications

## 2024-08-12 ENCOUNTER — Telehealth: Payer: Self-pay | Admitting: Pediatrics

## 2024-08-12 NOTE — Telephone Encounter (Signed)
 For AWV. Please call [phone] he will not be at the other number.

## 2024-08-13 ENCOUNTER — Ambulatory Visit

## 2024-08-13 NOTE — Telephone Encounter (Signed)
 Requested medication (s) are due for refill today: yes  Requested medication (s) are on the active medication list: yes  Last refill:  multiple dates  Future visit scheduled: yes  Notes to clinic:  Unable to refill per protocol, last refill by another provider.      Requested Prescriptions  Pending Prescriptions Disp Refills   Cholecalciferol  (VITAMIN D3) 50 MCG (2000 UT) capsule      Sig: Take 1 capsule (2,000 Units total) by mouth daily.     Endocrinology:  Vitamins - Vitamin D  Supplementation 2 Failed - 08/13/2024 12:37 PM      Failed - Manual Review: Route requests for 50,000 IU strength to the provider      Failed - Vitamin D  in normal range and within 360 days    Vit D, 25-Hydroxy  Date Value Ref Range Status  03/30/2020 11.80 (L) 30 - 100 ng/mL Final    Comment:    (NOTE) Vitamin D  deficiency has been defined by the Institute of Medicine  and an Endocrine Society practice guideline as a level of serum 25-OH  vitamin D  less than 20 ng/mL (1,2). The Endocrine Society went on to  further define vitamin D  insufficiency as a level between 21 and 29  ng/mL (2).  1. IOM (Institute of Medicine). 2010. Dietary reference intakes for  calcium  and D. [place]: [employer]. 2. Holick MF, Binkley Corley, Bischoff-Ferrari HA, et al. Evaluation,  treatment, and prevention of vitamin D  deficiency: an Endocrine  Society clinical practice guideline, JCEM. 2011 Jul; 96(7): 1911-30.  Performed at [facility], [address], [place], [place] [address]          Passed - Ca in normal range and within 360 days    Calcium   Date Value Ref Range Status  06/30/2024 9.3 8.6 - 10.2 mg/dL Final   Calcium , Total  Date Value Ref Range Status  05/02/2014 8.5 8.5 - 10.1 mg/dL Final         Passed - Valid encounter within last 12 months    Recent Outpatient Visits           1 month ago Essential hypertension   [place] [facility] Herold Hadassah SQUIBB, MD   3 months ago Essential hypertension   [place] [facility] Herold Hadassah SQUIBB, MD               cloNIDine  (CATAPRES ) 0.1 MG tablet 60 tablet 11    Sig: Take 1 tablet (0.1 mg total) by mouth 3 (three) times daily as needed (take 1 tablet by mouth three times a day for SBP greater then or equal to 160 or DBP greater than or equal to 90).     Cardiovascular:  Alpha-2 Agonists Passed - 08/13/2024 12:37 PM      Passed - Last BP in normal range    BP Readings from Last 1 Encounters:  06/30/24 119/71         Passed - Last Heart Rate in normal range    Pulse Readings from Last 1 Encounters:  06/30/24 75         Passed - Valid encounter within last 6 months    Recent Outpatient Visits           1 month ago Essential hypertension   [place] [facility] Herold Hadassah SQUIBB, MD   3 months ago Essential hypertension   [place] [facility] Herold Hadassah SQUIBB, MD  spironolactone  (ALDACTONE ) 25 MG tablet      Sig: Take 1 tablet (25 mg total) by mouth daily.     Cardiovascular: Diuretics - Aldosterone Antagonist Passed - 08/13/2024 12:37 PM      Passed - Cr in normal range and within 180 days    Creatinine  Date Value Ref Range Status  01/28/2020 248.3 20.0 - 300.0 mg/dL Final  91/90/7984 8.77 0.60 - 1.30 mg/dL Final   Creatinine, Ser  Date Value Ref Range Status  06/30/2024 1.13 0.76 - 1.27 mg/dL Final         Passed - K in normal range and within 180 days    Potassium  Date Value Ref Range Status  06/30/2024 4.4 3.5 - 5.2 mmol/L Final  05/02/2014 3.6 3.5 - 5.1 mmol/L Final         Passed - Na in normal range and within 180 days    Sodium  Date Value Ref Range Status  06/30/2024 137 134 - 144 mmol/L Final  05/02/2014 126 (L) 136 - 145 mmol/L Final         Passed - eGFR is 30 or above and within 180 days    EGFR (African American)  Date Value Ref Range Status  05/02/2014 >60  Final   GFR calc Af Amer   Date Value Ref Range Status  05/10/2020 >60 >60 mL/min Final   EGFR (Non-African Amer.)  Date Value Ref Range Status  05/02/2014 >60  Final    Comment:    eGFR values <26mL/min/1.73 m2 may be an indication of chronic kidney disease (CKD). Calculated eGFR is useful in patients with stable renal function. The eGFR calculation will not be reliable in acutely ill patients when serum creatinine is changing rapidly. It is not useful in  patients on dialysis. The eGFR calculation may not be applicable to patients at the low and high extremes of body sizes, pregnant women, and vegetarians.    GFR, Estimated  Date Value Ref Range Status  06/17/2023 >60 >60 mL/min Final    Comment:    (NOTE) Calculated using the CKD-EPI Creatinine Equation (2021)    eGFR  Date Value Ref Range Status  06/30/2024 72 >59 mL/min/1.73 Final         Passed - Last BP in normal range    BP Readings from Last 1 Encounters:  06/30/24 119/71         Passed - Valid encounter within last 6 months    Recent Outpatient Visits           1 month ago Essential hypertension   [place] [facility] Herold Hadassah SQUIBB, MD   3 months ago Essential hypertension    [facility] Herold Hadassah SQUIBB, MD               metFORMIN  (GLUCOPHAGE ) 500 MG tablet 30 tablet 2    Sig: Take 1 tablet (500 mg total) by mouth daily with breakfast.     Endocrinology:  Diabetes - Biguanides Failed - 08/13/2024 12:37 PM      Failed - B12 Level in normal range and within 720 days    Vitamin B-12  Date Value Ref Range Status  06/30/2024 CANCELED pg/mL     Comment:    Test not performed. We have received your request for additional testing. We are not able to add the test(s) requested.  Result canceled by the ancillary.          Passed - Cr  in normal range and within 360 days    Creatinine  Date Value Ref Range Status  01/28/2020 248.3 20.0 - 300.0 mg/dL Final  91/90/7984 8.77 0.60 -  1.30 mg/dL Final   Creatinine, Ser  Date Value Ref Range Status  06/30/2024 1.13 0.76 - 1.27 mg/dL Final         Passed - HBA1C is between 0 and 7.9 and within 180 days    HB A1C (BAYER DCA - WAIVED)  Date Value Ref Range Status  05/10/2015 5.6 <7.0 % Final    Comment:                                          Diabetic Adult            <7.0                                       Healthy Adult        4.3 - 5.7                                                           (DCCT/NGSP) American Diabetes Association's Summary of Glycemic Recommendations for Adults with Diabetes: Hemoglobin A1c <7.0%. More stringent glycemic goals (A1c <6.0%) may further reduce complications at the cost of increased risk of hypoglycemia.    Hgb A1c MFr Bld  Date Value Ref Range Status  06/30/2024 5.9 (H) 4.8 - 5.6 % Final    Comment:             Prediabetes: 5.7 - 6.4          Diabetes: >6.4          Glycemic control for adults with diabetes: <7.0          Passed - eGFR in normal range and within 360 days    EGFR (African American)  Date Value Ref Range Status  05/02/2014 >60  Final   GFR calc Af Amer  Date Value Ref Range Status  05/10/2020 >60 >60 mL/min Final   EGFR (Non-African Amer.)  Date Value Ref Range Status  05/02/2014 >60  Final    Comment:    eGFR values <43mL/min/1.73 m2 may be an indication of chronic kidney disease (CKD). Calculated eGFR is useful in patients with stable renal function. The eGFR calculation will not be reliable in acutely ill patients when serum creatinine is changing rapidly. It is not useful in  patients on dialysis. The eGFR calculation may not be applicable to patients at the low and high extremes of body sizes, pregnant women, and vegetarians.    GFR, Estimated  Date Value Ref Range Status  06/17/2023 >60 >60 mL/min Final    Comment:    (NOTE) Calculated using the CKD-EPI Creatinine Equation (2021)    eGFR  Date Value Ref Range Status   06/30/2024 72 >59 mL/min/1.73 Final         Passed - Valid encounter within last 6 months    Recent Outpatient Visits           1 month ago Essential hypertension   [place]  [facility] Herold Hadassah SQUIBB, MD   3 months ago Essential hypertension   [place] [facility] Herold Hadassah SQUIBB, MD              Passed - CBC within normal limits and completed in the last 12 months    WBC  Date Value Ref Range Status  06/30/2024 5.0 3.4 - 10.8 x10E3/uL Final  07/12/2020 3.3 (L) 4.0 - 10.5 K/uL Final   RBC  Date Value Ref Range Status  06/30/2024 4.17 4.14 - 5.80 x10E6/uL Final  07/12/2020 4.73 4.22 - 5.81 MIL/uL Final   Hemoglobin  Date Value Ref Range Status  06/30/2024 10.9 (L) 13.0 - 17.7 g/dL Final   Hematocrit  Date Value Ref Range Status  06/30/2024 36.2 (L) 37.5 - 51.0 % Final   MCHC  Date Value Ref Range Status  06/30/2024 30.1 (L) 31.5 - 35.7 g/dL Final  89/80/7978 69.2 30.0 - 36.0 g/dL Final   [facility]  Date Value Ref Range Status  06/30/2024 26.1 (L) 26.6 - 33.0 pg Final  07/12/2020 25.4 (L) 26.0 - 34.0 pg Final   MCV  Date Value Ref Range Status  06/30/2024 87 79 - 97 fL Final  05/02/2014 94 80 - 100 fL Final   No results found for: PLTCOUNTKUC, LABPLAT, POCPLA RDW  Date Value Ref Range Status  06/30/2024 13.1 11.6 - 15.4 % Final  05/02/2014 13.8 11.5 - 14.5 % Final          magnesium  oxide (MAG-OX) 400 (240 Mg) MG tablet      Sig: Take 1 tablet (400 mg total) by mouth daily.     Endocrinology:  Minerals - Magnesium  Supplementation Failed - 08/13/2024 12:37 PM      Failed - Mg Level in normal range and within 360 days    Magnesium   Date Value Ref Range Status  04/08/2020 1.6 (L) 1.7 - 2.4 mg/dL Final    Comment:    Performed at [facility], [address] [place], [place] [address]         Passed - Cr in normal range and within 360 days    Creatinine  Date Value Ref Range Status  01/28/2020 248.3 20.0 - 300.0  mg/dL Final  91/90/7984 8.77 0.60 - 1.30 mg/dL Final   Creatinine, Ser  Date Value Ref Range Status  06/30/2024 1.13 0.76 - 1.27 mg/dL Final         Passed - Valid encounter within last 12 months    Recent Outpatient Visits           1 month ago Essential hypertension   [place] [facility] Herold Hadassah SQUIBB, MD   3 months ago Essential hypertension   [place] [facility] Herold Hadassah SQUIBB, MD               sodium chloride  1 g tablet      Sig: Take 1 tablet (1 g total) by mouth 3 (three) times daily.     Off-Protocol Failed - 08/13/2024 12:37 PM      Failed - Medication not assigned to a protocol, review manually.      Passed - Valid encounter within last 12 months    Recent Outpatient Visits           1 month ago Essential hypertension   [place] [facility] Herold Hadassah SQUIBB, MD   3 months ago Essential hypertension   [place] [facility] [place],  Hadassah SQUIBB, MD               senna-docusate (SENOKOT-S) 8.6-50 MG tablet      Sig: Take 1 tablet by mouth daily.     Over the Counter:  OTC Passed - 08/13/2024 12:37 PM      Passed - Valid encounter within last 12 months    Recent Outpatient Visits           1 month ago Essential hypertension   [place] [facility] Herold Hadassah SQUIBB, MD   3 months ago Essential hypertension    [facility] Herold Hadassah SQUIBB, MD

## 2024-08-18 MED ORDER — CLONIDINE HCL 0.1 MG PO TABS: 0.1000 mg | ORAL_TABLET | Freq: Every day | ORAL | 0 refills | Status: AC

## 2024-08-18 MED ORDER — MAGNESIUM OXIDE -MG SUPPLEMENT 400 (240 MG) MG PO TABS: 400.0000 mg | ORAL_TABLET | Freq: Two times a day (BID) | ORAL | 2 refills | Status: AC

## 2024-08-18 MED ORDER — SODIUM CHLORIDE 1 G PO TABS: 1.0000 g | ORAL_TABLET | Freq: Two times a day (BID) | ORAL | 1 refills | Status: AC

## 2024-08-18 MED ORDER — METFORMIN HCL 500 MG PO TABS
500.0000 mg | ORAL_TABLET | Freq: Every day | ORAL | 2 refills | Status: AC
Start: 2024-08-18 — End: 2024-11-16

## 2024-08-18 MED ORDER — SENNOSIDES-DOCUSATE SODIUM 8.6-50 MG PO TABS: 1.0000 | ORAL_TABLET | Freq: Every day | ORAL | 3 refills | Status: AC

## 2024-08-18 MED ORDER — VITAMIN D3 50 MCG (2000 UT) PO CAPS
2000.0000 [IU] | ORAL_CAPSULE | Freq: Every day | ORAL | 3 refills | Status: AC
Start: 2024-08-18 — End: ?

## 2024-08-19 ENCOUNTER — Telehealth: Payer: Self-pay | Admitting: Pediatrics

## 2024-08-19 ENCOUNTER — Other Ambulatory Visit: Payer: Self-pay | Admitting: Pediatrics

## 2024-08-19 NOTE — Telephone Encounter (Signed)
 Copied from CRM [id number]. Topic: Clinical - Prescription Issue >> Aug 18, 2024  3:36 PM Zebedee SAUNDERS wrote: Reason for CRM: Received call from [facility] - [address] [place] [address] Phone: [phone] Fax: [phone] regarding senna-docusate (SENOKOT-S) 8.6-50 MG tablet need clarification on frequency. Please fax or call.

## 2024-08-25 NOTE — Telephone Encounter (Signed)
 Requested medication (s) are due for refill today: yes  Requested medication (s) are on the active medication list: no  Last refill:  06/30/24  Future visit scheduled: yes  Notes to clinic:  historical medication     Requested Prescriptions  Pending Prescriptions Disp Refills   ACCU-CHEK GUIDE TEST test strip [Pharmacy Med Name: Accu-Chek Guide Test Strip] 100 strip 11    Sig: USE AS DIRECTED TO CHECK BLOOD SUGAR TWICE DAILY     There is no refill protocol information for this order

## 2024-08-28 ENCOUNTER — Telehealth: Payer: Self-pay

## 2024-08-28 NOTE — Telephone Encounter (Signed)
 Copied from CRM #[id number]. Topic: Clinical - Prescription Issue >> Aug 28, 2024 10:23 AM Herma G wrote: Reason for CRM: [facility] - [place], [place] - [address] called regarding metFORMIN  (GLUCOPHAGE ) 500 MG tablet, as new prescription directions state to take 1 pill day, previously 3 times a day. Call back for clarification at [phone].

## 2024-09-02 NOTE — Progress Notes (Signed)
 MRN : [id number]  Brad Graham is a 63 y.o. ([birth year]) male who presents with chief complaint of legs swell.  History of Present Illness:   The patient returns to the office for followup evaluation regarding leg swelling.  The swelling has improved quite a bit and the pain associated with swelling has decreased substantially.  No development of any ulcerations.  No episodes of cellulitis over the past year no hospitalizations.   Since the previous visit the patient has been wearing graduated compression stockings and has noted some improvement in the lymphedema. The patient has been using compression routinely morning until night.  He is now living in a group home and has help putting on the compression everyday.   The patient also states elevation during the day and exercise (such as walking) is being done too.  No outpatient medications have been marked as taking for the 09/03/24 encounter (Appointment) with Jama, Cordella MATSU, MD.    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   Hypertensive heart disease with heart failure (HCC) 03/08/2022   Syncope 07/02/2016   Syncope and collapse 01/27/2020    Past Surgical History:  Procedure Laterality Date   none      Social History Social History   Tobacco Use   Smoking status: Every Day    Types: Pipe   Smokeless tobacco:  Never  Vaping Use   Vaping status: Never Used  Substance Use Topics   Alcohol use: Not Currently    Comment: fifth every other day   Drug use: No    Family History Family History  Problem Relation Age of Onset   Diabetes Mother    Stroke Mother    Heart disease Maternal Grandmother    Heart disease Maternal Grandfather     No Known Allergies   REVIEW OF SYSTEMS (Negative unless checked)  Constitutional: [] Weight loss  [] Fever  [] Chills Cardiac: [] Chest pain   [] Chest pressure   [] Palpitations   [] Shortness of breath when laying flat   [] Shortness of breath with exertion. Vascular:  [] Pain in legs with walking   [x] Pain in legs with standing  [] History of DVT   [] Phlebitis   [x] Swelling in legs   [] Varicose veins   [] Non-healing ulcers Pulmonary:   [] Uses home oxygen   [] Productive cough   [] Hemoptysis   [] Wheeze  [] COPD   [] Asthma Neurologic:  [] Dizziness   [] Seizures   [] History of stroke   [] History of TIA  [] Aphasia   [] Vissual changes   [] Weakness or numbness in arm   [] Weakness or numbness in leg Musculoskeletal:   [] Joint swelling   [] Joint pain   [] Low back pain Hematologic:  [] Easy bruising  [] Easy bleeding   [] Hypercoagulable  state   [] Anemic Gastrointestinal:  [] Diarrhea   [] Vomiting  [] Gastroesophageal reflux/heartburn   [] Difficulty swallowing. Genitourinary:  [] Chronic kidney disease   [] Difficult urination  [] Frequent urination   [] Blood in urine Skin:  [] Rashes   [] Ulcers  Psychological:  [] History of anxiety   []  History of major depression.  Physical Examination  There were no vitals filed for this visit. There is no height or weight on file to calculate BMI. Gen: WD/WN, NAD Head: [place]/AT, No temporalis wasting.  Ear/Nose/Throat: Hearing grossly intact, nares w/o erythema or drainage, pinna without lesions Eyes: PER, EOMI, sclera nonicteric.  Neck: Supple, no gross masses.  No JVD.  Pulmonary:  Good air movement, no audible wheezing, no use of accessory  muscles.  Cardiac: RRR, precordium not hyperdynamic. Vascular:  scattered varicosities present bilaterally.  Mild venous stasis changes to the legs bilaterally.  3-4+ soft pitting edema, CEAP [id number]  Vessel Right Left  Radial Palpable Palpable  Gastrointestinal: soft, non-distended. No guarding/no peritoneal signs.  Musculoskeletal: M/S 5/5 throughout.  No deformity.  Neurologic: CN 2-12 intact. Pain and light touch intact in extremities.  Symmetrical.  Speech is fluent. Motor exam as listed above. Psychiatric: Judgment intact, Mood & affect appropriate for pt's clinical situation. Dermatologic: Venous rashes no ulcers noted.  No changes consistent with cellulitis. Lymph : No lichenification or skin changes of chronic lymphedema.  CBC Lab Results  Component Value Date   WBC 5.0 06/30/2024   HGB 10.9 (L) 06/30/2024   HCT 36.2 (L) 06/30/2024   MCV 87 06/30/2024   PLT 192 06/30/2024    BMET    Component Value Date/Time   NA 137 06/30/2024 1203   NA 126 (L) 05/02/2014 0921   K 4.4 06/30/2024 1203   K 3.6 05/02/2014 0921   CL 102 06/30/2024 1203   CL 93 (L) 05/02/2014 0921   CO2 21 06/30/2024 1203   CO2 21 05/02/2014 0921   GLUCOSE 93 06/30/2024 1203   GLUCOSE 83 06/17/2023 1457   GLUCOSE 106 (H) 05/02/2014 0921   BUN 17 06/30/2024 1203   BUN 19 (H) 05/02/2014 0921   CREATININE 1.13 06/30/2024 1203   CREATININE 1.22 05/02/2014 0921   CALCIUM  9.3 06/30/2024 1203   CALCIUM  8.5 05/02/2014 0921   GFRNONAA >60 06/17/2023 1457   GFRNONAA >60 05/02/2014 0921   GFRAA >60 05/10/2020 1249   GFRAA >60 05/02/2014 0921   CrCl cannot be calculated (Patient's most recent lab result is older than the maximum 21 days allowed.).  COAG Lab Results  Component Value Date   INR 1.1 01/27/2020   INR 1.07 08/02/2016    Radiology No results found.   Assessment/Plan 1. Lymphedema (Primary) Recommend:  No surgery or intervention at this point in time.    I have reviewed my  discussion with the patient regarding venous insufficiency and secondary lymph edema and why it  causes symptoms. I have discussed with the patient the chronic skin changes that accompany these problems and the long term sequela such as ulceration and infection.  Patient will continue wearing graduated compression on a daily basis a prescription, if needed, was given to the patient to keep this updated. The patient will  put the compression on first thing in the morning and removing them in the evening. The patient is instructed specifically not to sleep in the compression.  In addition, behavioral modification including elevation during the day will be continued.  Diet and salt restriction will also be helpful.  Previous duplex ultrasound of  the lower extremities shows normal deep venous system, superficial reflux was not present.   Following the review of the ultrasound the patient will follow up in 12 months to reassess the degree of swelling and the control that graduated compression is offering.   The patient can be assessed for a Lymph Pump at that time.  However, at this time the patient states they are satisfied with the control compression and elevation is yielding.    2. Venous insufficiency (chronic) (peripheral) Recommend:  No surgery or intervention at this point in time.    I have reviewed my discussion with the patient regarding venous insufficiency and secondary lymph edema and why it  causes symptoms. I have discussed with the patient the chronic skin changes that accompany these problems and the long term sequela such as ulceration and infection.  Patient will continue wearing graduated compression on a daily basis a prescription, if needed, was given to the patient to keep this updated. The patient will  put the compression on first thing in the morning and removing them in the evening. The patient is instructed specifically not to sleep in the compression.  In addition, behavioral  modification including elevation during the day will be continued.  Diet and salt restriction will also be helpful.  Previous duplex ultrasound of the lower extremities shows normal deep venous system, superficial reflux was not present.   Following the review of the ultrasound the patient will follow up in 12 months to reassess the degree of swelling and the control that graduated compression is offering.   The patient can be assessed for a Lymph Pump at that time.  However, at this time the patient states they are satisfied with the control compression and elevation is yielding.    3. Essential hypertension Continue antihypertensive medications as already ordered, these medications have been reviewed and there are no changes at this time.  4. Diabetes mellitus without complication (HCC) Continue hypoglycemic medications as already ordered, these medications have been reviewed and there are no changes at this time.  Hgb A1C to be monitored as already arranged by primary service  5. Mixed hyperlipidemia Continue statin as ordered and reviewed, no changes at this time    Cordella Shawl, MD  09/02/2024 4:21 PM

## 2024-09-03 ENCOUNTER — Ambulatory Visit (INDEPENDENT_AMBULATORY_CARE_PROVIDER_SITE_OTHER): Payer: Medicaid Other | Admitting: Vascular Surgery

## 2024-09-03 ENCOUNTER — Encounter (INDEPENDENT_AMBULATORY_CARE_PROVIDER_SITE_OTHER): Payer: Self-pay | Admitting: Vascular Surgery

## 2024-09-03 VITALS — BP 153/83 | HR 67 | Resp 17 | Ht 71.0 in | Wt 241.0 lb

## 2024-09-03 DIAGNOSIS — I872 Venous insufficiency (chronic) (peripheral): Secondary | ICD-10-CM

## 2024-09-03 DIAGNOSIS — I890 Lymphedema, not elsewhere classified: Secondary | ICD-10-CM | POA: Diagnosis not present

## 2024-09-03 DIAGNOSIS — E119 Type 2 diabetes mellitus without complications: Secondary | ICD-10-CM | POA: Diagnosis not present

## 2024-09-03 DIAGNOSIS — I10 Essential (primary) hypertension: Secondary | ICD-10-CM

## 2024-09-03 DIAGNOSIS — E782 Mixed hyperlipidemia: Secondary | ICD-10-CM

## 2024-09-14 ENCOUNTER — Encounter: Payer: Self-pay | Admitting: Podiatry

## 2024-09-14 ENCOUNTER — Ambulatory Visit (INDEPENDENT_AMBULATORY_CARE_PROVIDER_SITE_OTHER): Admitting: Podiatry

## 2024-09-14 DIAGNOSIS — E119 Type 2 diabetes mellitus without complications: Secondary | ICD-10-CM

## 2024-09-14 DIAGNOSIS — M79674 Pain in right toe(s): Secondary | ICD-10-CM | POA: Diagnosis not present

## 2024-09-14 DIAGNOSIS — B351 Tinea unguium: Secondary | ICD-10-CM | POA: Diagnosis not present

## 2024-09-14 DIAGNOSIS — M79675 Pain in left toe(s): Secondary | ICD-10-CM | POA: Diagnosis not present

## 2024-09-15 ENCOUNTER — Other Ambulatory Visit: Payer: Self-pay | Admitting: Pediatrics

## 2024-09-15 DIAGNOSIS — I10 Essential (primary) hypertension: Secondary | ICD-10-CM

## 2024-09-15 NOTE — Progress Notes (Signed)
"  °  Subjective:  Patient ID: Brad Graham, male    DOB: 1961/09/13,  MRN: [id number]  Brad Graham presents to clinic today for preventative diabetic foot care for thick, elongated toenails of both feet which are tender when wearing enclosed shoe gear. He is a resident of [facility].  Chief Complaint  Patient presents with   Nail Problem    He saw Dr. Herold in Nov. His A1c is 5.9   New problem(s): None.   PCP is Herold Hadassah SQUIBB, MD.  Allergies[1]  Review of Systems: Negative except as noted in the HPI.  Objective: No changes noted in today's physical examination. There were no vitals filed for this visit. Brad Graham is a pleasant 63 y.o. male in NAD. AAO x 3.  Vascular Examination: CFT <4 seconds b/l LE. Palpable DP pulse(s) b/l LE. Palpable PT pulse(s) b/l LE. Pedal hair absent. Lymphedema present BLE. No ischemia or gangrene noted b/l LE. No cyanosis or clubbing noted b/l LE.  Neurological Examination: Protective sensation intact 5/5 intact bilaterally with 10g monofilament b/l. Vibratory sensation diminished b/l.  Dermatological Examination: Pedal skin with normal turgor, texture and tone b/l.  No open wounds. No interdigital macerations.   Toenails 1-5 b/l thick, discolored, elongated with subungual debris and pain on dorsal palpation.   No open wounds b/l LE. No interdigital macerations noted b/l LE. Toenails 1-5 bilaterally elongated, discolored, dystrophic, thickened, and crumbly with subungual debris and tenderness to dorsal palpation. No hyperkeratotic nor porokeratotic lesions present on today's visit.  Musculoskeletal Examination: Muscle strength 5/5 to all lower extremity muscle groups bilaterally. No pain, crepitus or joint limitation noted with ROM b/l LE. Pes planus deformity noted bilateral LE.SABRA Patient ambulates with cane assistance.  Radiographs: None  Assessment/Plan: 1. Pain due to onychomycosis of toenails of both feet   2. Diabetes  mellitus without complication [facility])   Consent given for treatment. Patient examined. All patient's and/or POA's questions/concerns addressed on today's visit. Toenails 1-5 b/l debrided in length and girth without incident. Continue foot and shoe inspections daily. Monitor blood glucose per PCP/Endocrinologist's recommendations. Continue soft, supportive shoe gear daily. Report any pedal injuries to medical professional. Call office if there are any questions/concerns. -Patient/POA to call should there be question/concern in the interim.   Return in about 3 months (around 12/13/2024).  Brad Graham, DPM      [facility] LOCATION: [address] [place] [address]                   Office [phone]   [facility] LOCATION: [address] [place], [place] [address] Office [phone]     [1] No Known Allergies  "

## 2024-10-08 ENCOUNTER — Other Ambulatory Visit: Payer: Self-pay

## 2024-10-08 ENCOUNTER — Other Ambulatory Visit: Payer: Self-pay | Admitting: Nurse Practitioner

## 2024-10-08 ENCOUNTER — Ambulatory Visit

## 2024-10-08 MED ORDER — SPIRONOLACTONE 25 MG PO TABS: 12.5000 mg | ORAL_TABLET | Freq: Every day | ORAL | 1 refills | Status: AC

## 2024-10-08 MED ORDER — SPIRONOLACTONE 25 MG PO TABS: 12.5000 mg | ORAL_TABLET | Freq: Every day | ORAL | 1 refills | Status: DC

## 2024-10-08 MED ORDER — SPIRONOLACTONE 25 MG PO TABS: 12.5000 mg | ORAL_TABLET | Freq: Once | ORAL | 1 refills | Status: DC

## 2024-10-08 MED ORDER — CLONIDINE HCL 0.1 MG PO TABS: 0.1000 mg | ORAL_TABLET | Freq: Every day | ORAL | 1 refills | Status: AC

## 2024-10-08 NOTE — Telephone Encounter (Signed)
 Copied from CRM [id number]. Topic: Clinical - Prescription Issue >> Oct 08, 2024 10:22 AM Ole G wrote: Reason for CRM: Cadeema from [facility]SABRA rcvd spironolactone  (ALDACTONE ) 25 MG tablet [[id number]].. needs clarity if its daily per instructions says once?  Pls call her [phone].SABRA

## 2024-10-13 ENCOUNTER — Telehealth: Payer: Self-pay | Admitting: Nurse Practitioner

## 2024-10-13 NOTE — Telephone Encounter (Signed)
 Contacted the pharmacy and clarified the prescription.

## 2024-10-13 NOTE — Telephone Encounter (Signed)
 Routing to provider to clarify. Prescription is supposed to be for 0.5 tablet daily? Or is it supposed to be 1 tablet daily?

## 2024-10-13 NOTE — Telephone Encounter (Signed)
 Copied from CRM [id number]. Topic: Clinical - Prescription Issue >> Oct 13, 2024 12:16 PM Zebedee SAUNDERS wrote: Reason for RMF:Mzrzpczi call from [facility] - [address] [place] [address] Phone: [phone] Fax: [phone], need clarification regarding spironolactone  (ALDACTONE ) 25 MG tablet as once daily? Please fax or call response.

## 2024-12-11 ENCOUNTER — Encounter: Admitting: Nurse Practitioner

## 2024-12-17 ENCOUNTER — Ambulatory Visit: Admitting: Podiatry

## 2024-12-29 ENCOUNTER — Ambulatory Visit: Admitting: Pediatrics

## 2026-09-02 ENCOUNTER — Ambulatory Visit (INDEPENDENT_AMBULATORY_CARE_PROVIDER_SITE_OTHER): Admitting: Nurse Practitioner
